# Patient Record
Sex: Male | Born: 1950
Health system: Southern US, Community
[De-identification: ages and names within clinical notes are randomized; demographics above are authoritative.]

## PROBLEM LIST (undated history)

## (undated) DIAGNOSIS — G4730 Sleep apnea, unspecified: Secondary | ICD-10-CM

## (undated) DIAGNOSIS — K5790 Diverticulosis of intestine, part unspecified, without perforation or abscess without bleeding: Secondary | ICD-10-CM

## (undated) DIAGNOSIS — R931 Abnormal findings on diagnostic imaging of heart and coronary circulation: Secondary | ICD-10-CM

## (undated) DIAGNOSIS — M25559 Pain in unspecified hip: Secondary | ICD-10-CM

## (undated) DIAGNOSIS — F32A Depression, unspecified: Secondary | ICD-10-CM

## (undated) DIAGNOSIS — G4733 Obstructive sleep apnea (adult) (pediatric): Secondary | ICD-10-CM

## (undated) DIAGNOSIS — Z9289 Personal history of other medical treatment: Secondary | ICD-10-CM

## (undated) DIAGNOSIS — E669 Obesity, unspecified: Secondary | ICD-10-CM

## (undated) DIAGNOSIS — Q210 Ventricular septal defect: Secondary | ICD-10-CM

## (undated) DIAGNOSIS — N400 Enlarged prostate without lower urinary tract symptoms: Secondary | ICD-10-CM

## (undated) DIAGNOSIS — M1990 Unspecified osteoarthritis, unspecified site: Secondary | ICD-10-CM

## (undated) DIAGNOSIS — Z5189 Encounter for other specified aftercare: Secondary | ICD-10-CM

## (undated) DIAGNOSIS — D649 Anemia, unspecified: Secondary | ICD-10-CM

## (undated) DIAGNOSIS — C801 Malignant (primary) neoplasm, unspecified: Secondary | ICD-10-CM

## (undated) DIAGNOSIS — F419 Anxiety disorder, unspecified: Secondary | ICD-10-CM

## (undated) DIAGNOSIS — Z9889 Other specified postprocedural states: Secondary | ICD-10-CM

## (undated) DIAGNOSIS — R000 Tachycardia, unspecified: Secondary | ICD-10-CM

## (undated) DIAGNOSIS — J189 Pneumonia, unspecified organism: Secondary | ICD-10-CM

## (undated) DIAGNOSIS — K219 Gastro-esophageal reflux disease without esophagitis: Secondary | ICD-10-CM

## (undated) DIAGNOSIS — E785 Hyperlipidemia, unspecified: Secondary | ICD-10-CM

## (undated) DIAGNOSIS — I10 Essential (primary) hypertension: Secondary | ICD-10-CM

## (undated) DIAGNOSIS — F329 Major depressive disorder, single episode, unspecified: Secondary | ICD-10-CM

## (undated) DIAGNOSIS — H269 Unspecified cataract: Secondary | ICD-10-CM

## (undated) DIAGNOSIS — K2210 Ulcer of esophagus without bleeding: Secondary | ICD-10-CM

## (undated) DIAGNOSIS — R9431 Abnormal electrocardiogram [ECG] [EKG]: Secondary | ICD-10-CM

## (undated) HISTORY — DX: Personal history of other medical treatment: Z92.89

## (undated) HISTORY — DX: Tachycardia, unspecified: R00.0

## (undated) HISTORY — DX: Unspecified cataract: H26.9

## (undated) HISTORY — PX: UPPER GASTROINTESTINAL ENDOSCOPY: SHX188

## (undated) HISTORY — DX: Diverticulosis of intestine, part unspecified, without perforation or abscess without bleeding: K57.90

## (undated) HISTORY — DX: Benign prostatic hyperplasia without lower urinary tract symptoms: N40.0

## (undated) HISTORY — PX: CATARACT EXTRACTION, BILATERAL: SHX1313

## (undated) HISTORY — DX: Depression, unspecified: F32.A

## (undated) HISTORY — DX: Obstructive sleep apnea (adult) (pediatric): G47.33

## (undated) HISTORY — DX: Obesity, unspecified: E66.9

## (undated) HISTORY — DX: Other specified postprocedural states: Z98.890

## (undated) HISTORY — DX: Abnormal findings on diagnostic imaging of heart and coronary circulation: R93.1

## (undated) HISTORY — DX: Encounter for other specified aftercare: Z51.89

## (undated) HISTORY — DX: Sleep apnea, unspecified: G47.30

## (undated) HISTORY — DX: Hyperlipidemia, unspecified: E78.5

## (undated) HISTORY — DX: Essential (primary) hypertension: I10

## (undated) HISTORY — DX: Major depressive disorder, single episode, unspecified: F32.9

## (undated) HISTORY — DX: Pain in unspecified hip: M25.559

## (undated) HISTORY — DX: Abnormal electrocardiogram (ECG) (EKG): R94.31

## (undated) HISTORY — DX: Ulcer of esophagus without bleeding: K22.10

## (undated) HISTORY — DX: Ventricular septal defect: Q21.0

## (undated) HISTORY — PX: COLONOSCOPY: SHX174

---

## 1983-11-10 HISTORY — PX: INGUINAL HERNIA REPAIR: SHX194

## 1985-11-09 HISTORY — PX: LAMINECTOMY: SHX219

## 1993-11-09 DIAGNOSIS — I10 Essential (primary) hypertension: Secondary | ICD-10-CM

## 1993-11-09 HISTORY — DX: Essential (primary) hypertension: I10

## 1997-11-09 HISTORY — PX: INGUINAL HERNIA REPAIR: SHX194

## 1998-04-09 DIAGNOSIS — E785 Hyperlipidemia, unspecified: Secondary | ICD-10-CM

## 1998-04-09 HISTORY — DX: Hyperlipidemia, unspecified: E78.5

## 1998-04-19 ENCOUNTER — Ambulatory Visit (HOSPITAL_BASED_OUTPATIENT_CLINIC_OR_DEPARTMENT_OTHER): Admission: RE | Admit: 1998-04-19 | Discharge: 1998-04-19 | Payer: Self-pay | Admitting: General Surgery

## 2000-05-24 ENCOUNTER — Ambulatory Visit (HOSPITAL_BASED_OUTPATIENT_CLINIC_OR_DEPARTMENT_OTHER): Admission: RE | Admit: 2000-05-24 | Discharge: 2000-05-24 | Payer: Self-pay | Admitting: Pulmonary Disease

## 2000-08-04 ENCOUNTER — Ambulatory Visit (HOSPITAL_BASED_OUTPATIENT_CLINIC_OR_DEPARTMENT_OTHER): Admission: RE | Admit: 2000-08-04 | Discharge: 2000-08-04 | Payer: Self-pay | Admitting: Pulmonary Disease

## 2002-02-20 ENCOUNTER — Encounter: Payer: Self-pay | Admitting: Family Medicine

## 2002-11-09 DIAGNOSIS — N400 Enlarged prostate without lower urinary tract symptoms: Secondary | ICD-10-CM

## 2002-11-09 HISTORY — DX: Benign prostatic hyperplasia without lower urinary tract symptoms: N40.0

## 2003-10-02 ENCOUNTER — Encounter: Payer: Self-pay | Admitting: Family Medicine

## 2003-11-01 LAB — FECAL OCCULT BLOOD, GUAIAC: Fecal Occult Blood: POSITIVE

## 2003-11-07 ENCOUNTER — Encounter: Payer: Self-pay | Admitting: Family Medicine

## 2006-07-13 ENCOUNTER — Ambulatory Visit: Payer: Self-pay | Admitting: Family Medicine

## 2006-08-11 ENCOUNTER — Ambulatory Visit: Payer: Self-pay | Admitting: Gastroenterology

## 2006-08-24 ENCOUNTER — Ambulatory Visit: Payer: Self-pay | Admitting: Gastroenterology

## 2006-08-24 ENCOUNTER — Encounter (INDEPENDENT_AMBULATORY_CARE_PROVIDER_SITE_OTHER): Payer: Self-pay | Admitting: Specialist

## 2006-08-24 HISTORY — PX: COLONOSCOPY W/ BIOPSIES: SHX1374

## 2006-10-14 ENCOUNTER — Encounter: Admission: RE | Admit: 2006-10-14 | Discharge: 2006-10-14 | Payer: Self-pay | Admitting: Sports Medicine

## 2006-11-03 ENCOUNTER — Encounter: Admission: RE | Admit: 2006-11-03 | Discharge: 2006-11-03 | Payer: Self-pay | Admitting: Sports Medicine

## 2006-11-24 ENCOUNTER — Encounter: Admission: RE | Admit: 2006-11-24 | Discharge: 2006-11-24 | Payer: Self-pay | Admitting: Sports Medicine

## 2006-11-30 ENCOUNTER — Ambulatory Visit: Payer: Self-pay | Admitting: Family Medicine

## 2006-11-30 LAB — CONVERTED CEMR LAB
CO2: 29 meq/L (ref 19–32)
Calcium: 9.7 mg/dL (ref 8.4–10.5)
Chloride: 102 meq/L (ref 96–112)
Cholesterol: 230 mg/dL (ref 0–200)
Creatinine, Ser: 1.1 mg/dL (ref 0.4–1.5)
GFR calc non Af Amer: 74 mL/min
Glucose, Bld: 150 mg/dL — ABNORMAL HIGH (ref 70–99)
HDL: 76.9 mg/dL (ref >39.0)
Sodium: 139 meq/L (ref 135–145)
Total Bilirubin: 1.1 mg/dL (ref 0.3–1.2)
Total CHOL/HDL Ratio: 3
Total Protein: 7.3 g/dL (ref 6.0–8.3)
Triglycerides: 143 mg/dL (ref 0–149)
VLDL: 29 mg/dL (ref 0–40)

## 2006-12-03 ENCOUNTER — Ambulatory Visit: Payer: Self-pay | Admitting: Family Medicine

## 2006-12-08 ENCOUNTER — Ambulatory Visit: Payer: Self-pay | Admitting: Family Medicine

## 2006-12-08 LAB — CONVERTED CEMR LAB
Glucose, Bld: 139 mg/dL — ABNORMAL HIGH (ref 70–99)
Hgb A1c MFr Bld: 7.2 %
Hgb A1c MFr Bld: 7.2 % — ABNORMAL HIGH (ref 4.6–6.0)

## 2006-12-15 ENCOUNTER — Ambulatory Visit: Payer: Self-pay | Admitting: Family Medicine

## 2007-01-13 ENCOUNTER — Ambulatory Visit: Payer: Self-pay | Admitting: Family Medicine

## 2007-02-14 ENCOUNTER — Ambulatory Visit: Payer: Self-pay | Admitting: Family Medicine

## 2007-04-14 ENCOUNTER — Encounter (INDEPENDENT_AMBULATORY_CARE_PROVIDER_SITE_OTHER): Payer: Self-pay | Admitting: *Deleted

## 2007-04-15 ENCOUNTER — Encounter: Payer: Self-pay | Admitting: Family Medicine

## 2007-04-15 DIAGNOSIS — Z87891 Personal history of nicotine dependence: Secondary | ICD-10-CM | POA: Insufficient documentation

## 2007-04-15 DIAGNOSIS — F341 Dysthymic disorder: Secondary | ICD-10-CM | POA: Insufficient documentation

## 2007-04-15 DIAGNOSIS — F528 Other sexual dysfunction not due to a substance or known physiological condition: Secondary | ICD-10-CM | POA: Insufficient documentation

## 2007-04-15 DIAGNOSIS — G4730 Sleep apnea, unspecified: Secondary | ICD-10-CM

## 2007-04-17 DIAGNOSIS — N400 Enlarged prostate without lower urinary tract symptoms: Secondary | ICD-10-CM | POA: Insufficient documentation

## 2007-04-17 DIAGNOSIS — E1169 Type 2 diabetes mellitus with other specified complication: Secondary | ICD-10-CM | POA: Insufficient documentation

## 2007-04-17 DIAGNOSIS — E119 Type 2 diabetes mellitus without complications: Secondary | ICD-10-CM

## 2007-04-17 DIAGNOSIS — E785 Hyperlipidemia, unspecified: Secondary | ICD-10-CM

## 2007-04-17 DIAGNOSIS — I10 Essential (primary) hypertension: Secondary | ICD-10-CM | POA: Insufficient documentation

## 2007-04-19 ENCOUNTER — Ambulatory Visit: Payer: Self-pay | Admitting: Family Medicine

## 2007-04-20 LAB — CONVERTED CEMR LAB
BUN: 12 mg/dL (ref 6–23)
CO2: 29 meq/L (ref 19–32)
Creatinine, Ser: 0.9 mg/dL (ref 0.4–1.5)
Hgb A1c MFr Bld: 7 % — ABNORMAL HIGH (ref 4.6–6.0)
Microalb, Ur: 1.5 mg/dL (ref 0.0–1.9)
Potassium: 4.5 meq/L (ref 3.5–5.1)
Sodium: 140 meq/L (ref 135–145)

## 2007-04-21 ENCOUNTER — Ambulatory Visit: Payer: Self-pay | Admitting: Family Medicine

## 2007-05-27 ENCOUNTER — Ambulatory Visit: Payer: Self-pay | Admitting: Family Medicine

## 2007-08-25 ENCOUNTER — Ambulatory Visit: Payer: Self-pay | Admitting: Family Medicine

## 2007-08-25 LAB — CONVERTED CEMR LAB: Hgb A1c MFr Bld: 6.8 % — ABNORMAL HIGH (ref 4.6–6.0)

## 2007-08-29 ENCOUNTER — Ambulatory Visit: Payer: Self-pay | Admitting: Family Medicine

## 2008-01-10 ENCOUNTER — Telehealth: Payer: Self-pay | Admitting: Family Medicine

## 2008-04-05 ENCOUNTER — Encounter: Admission: RE | Admit: 2008-04-05 | Discharge: 2008-04-05 | Payer: Self-pay | Admitting: Sports Medicine

## 2008-10-23 ENCOUNTER — Ambulatory Visit: Payer: Self-pay | Admitting: Family Medicine

## 2008-10-31 ENCOUNTER — Ambulatory Visit: Payer: Self-pay | Admitting: Family Medicine

## 2008-12-05 ENCOUNTER — Ambulatory Visit: Payer: Self-pay | Admitting: Family Medicine

## 2008-12-13 ENCOUNTER — Ambulatory Visit: Payer: Self-pay | Admitting: Family Medicine

## 2008-12-14 LAB — CONVERTED CEMR LAB
ALT: 27 units/L (ref 0–53)
AST: 25 units/L (ref 0–37)
Alkaline Phosphatase: 46 units/L (ref 39–117)
Basophils Absolute: 0 10*3/uL (ref 0.0–0.1)
Basophils Relative: 0.2 % (ref 0.0–3.0)
Bilirubin, Direct: 0.1 mg/dL (ref 0.0–0.3)
CO2: 0 meq/L — CL (ref 19–32)
Chloride: 109 meq/L (ref 96–112)
Cholesterol: 155 mg/dL (ref 0–200)
Eosinophils Relative: 1.5 % (ref 0.0–5.0)
GFR calc non Af Amer: 106 mL/min
Hemoglobin: 12.6 g/dL — ABNORMAL LOW (ref 13.0–17.0)
Hgb A1c MFr Bld: 6.7 % — ABNORMAL HIGH (ref 4.6–6.0)
LDL Cholesterol: 99 mg/dL (ref 0–99)
Lymphocytes Relative: 30.5 % (ref 12.0–46.0)
MCHC: 34.8 g/dL (ref 30.0–36.0)
MCV: 96.4 fL (ref 78.0–100.0)
Neutro Abs: 3.7 10*3/uL (ref 1.4–7.7)
PSA: 1.95 ng/mL (ref 0.10–4.00)
RBC: 3.77 M/uL — ABNORMAL LOW (ref 4.22–5.81)
TSH: 0.6 microintl units/mL (ref 0.35–5.50)
Total Bilirubin: 0.6 mg/dL (ref 0.3–1.2)
WBC: 6.5 10*3/uL (ref 4.5–10.5)

## 2008-12-17 ENCOUNTER — Ambulatory Visit: Payer: Self-pay | Admitting: Family Medicine

## 2008-12-17 DIAGNOSIS — E669 Obesity, unspecified: Secondary | ICD-10-CM | POA: Insufficient documentation

## 2008-12-18 ENCOUNTER — Encounter (INDEPENDENT_AMBULATORY_CARE_PROVIDER_SITE_OTHER): Payer: Self-pay | Admitting: *Deleted

## 2008-12-18 LAB — CONVERTED CEMR LAB: Vit D, 1,25-Dihydroxy: 22 — ABNORMAL LOW (ref 30–89)

## 2008-12-31 ENCOUNTER — Telehealth: Payer: Self-pay | Admitting: Family Medicine

## 2009-01-14 ENCOUNTER — Ambulatory Visit: Payer: Self-pay | Admitting: Family Medicine

## 2009-01-14 LAB — CONVERTED CEMR LAB
BUN: 15 mg/dL (ref 6–23)
Calcium: 9.4 mg/dL (ref 8.4–10.5)
Chloride: 102 meq/L (ref 96–112)
Creatinine, Ser: 0.8 mg/dL (ref 0.4–1.5)
GFR calc non Af Amer: 106 mL/min
Hgb A1c MFr Bld: 6.2 % — ABNORMAL HIGH (ref 4.6–6.0)

## 2009-01-16 ENCOUNTER — Ambulatory Visit: Payer: Self-pay | Admitting: Family Medicine

## 2009-03-29 ENCOUNTER — Encounter: Admission: RE | Admit: 2009-03-29 | Discharge: 2009-03-29 | Payer: Self-pay | Admitting: Sports Medicine

## 2009-04-17 ENCOUNTER — Ambulatory Visit: Payer: Self-pay | Admitting: Family Medicine

## 2009-04-17 LAB — CONVERTED CEMR LAB: Hgb A1c MFr Bld: 6.3 % (ref 4.6–6.5)

## 2009-04-22 ENCOUNTER — Ambulatory Visit: Payer: Self-pay | Admitting: Family Medicine

## 2009-04-22 DIAGNOSIS — R000 Tachycardia, unspecified: Secondary | ICD-10-CM

## 2009-05-01 ENCOUNTER — Ambulatory Visit: Payer: Self-pay | Admitting: Cardiology

## 2009-05-01 ENCOUNTER — Encounter: Payer: Self-pay | Admitting: Cardiology

## 2009-05-03 ENCOUNTER — Encounter (INDEPENDENT_AMBULATORY_CARE_PROVIDER_SITE_OTHER): Payer: Self-pay | Admitting: General Surgery

## 2009-05-09 DIAGNOSIS — Z9289 Personal history of other medical treatment: Secondary | ICD-10-CM

## 2009-05-09 DIAGNOSIS — R9431 Abnormal electrocardiogram [ECG] [EKG]: Secondary | ICD-10-CM

## 2009-05-09 DIAGNOSIS — R931 Abnormal findings on diagnostic imaging of heart and coronary circulation: Secondary | ICD-10-CM

## 2009-05-09 HISTORY — DX: Abnormal findings on diagnostic imaging of heart and coronary circulation: R93.1

## 2009-05-09 HISTORY — DX: Personal history of other medical treatment: Z92.89

## 2009-05-09 HISTORY — DX: Abnormal electrocardiogram (ECG) (EKG): R94.31

## 2009-05-10 LAB — CONVERTED CEMR LAB: TSH: 0.42 microintl units/mL (ref 0.350–4.500)

## 2009-05-14 ENCOUNTER — Encounter: Payer: Self-pay | Admitting: Family Medicine

## 2009-05-15 ENCOUNTER — Encounter: Payer: Self-pay | Admitting: Cardiology

## 2009-05-15 ENCOUNTER — Ambulatory Visit: Payer: Self-pay

## 2009-05-17 ENCOUNTER — Ambulatory Visit: Payer: Self-pay | Admitting: Cardiology

## 2009-05-22 ENCOUNTER — Ambulatory Visit (HOSPITAL_COMMUNITY): Admission: RE | Admit: 2009-05-22 | Discharge: 2009-05-22 | Payer: Self-pay | Admitting: Cardiology

## 2009-05-22 ENCOUNTER — Ambulatory Visit: Payer: Self-pay | Admitting: Cardiology

## 2009-05-22 LAB — CONVERTED CEMR LAB
Calcium: 9.2 mg/dL (ref 8.4–10.5)
Potassium: 4.5 meq/L (ref 3.5–5.3)
Sodium: 143 meq/L (ref 135–145)

## 2009-05-23 ENCOUNTER — Encounter (INDEPENDENT_AMBULATORY_CARE_PROVIDER_SITE_OTHER): Payer: Self-pay | Admitting: *Deleted

## 2009-05-23 ENCOUNTER — Telehealth: Payer: Self-pay | Admitting: Cardiology

## 2009-05-23 ENCOUNTER — Ambulatory Visit: Payer: Self-pay | Admitting: Cardiology

## 2009-05-23 ENCOUNTER — Encounter: Payer: Self-pay | Admitting: Cardiology

## 2009-05-23 DIAGNOSIS — Q210 Ventricular septal defect: Secondary | ICD-10-CM | POA: Insufficient documentation

## 2009-05-24 LAB — CONVERTED CEMR LAB
Glucose, Bld: 89 mg/dL (ref 70–99)
HCT: 41.6 % (ref 39.0–52.0)
MCV: 100.7 fL — ABNORMAL HIGH (ref 78.0–100.0)
Platelets: 238 10*3/uL (ref 150–400)
RDW: 12.3 % (ref 11.5–15.5)
Sodium: 143 meq/L (ref 135–145)

## 2009-05-30 ENCOUNTER — Inpatient Hospital Stay (HOSPITAL_BASED_OUTPATIENT_CLINIC_OR_DEPARTMENT_OTHER): Admission: RE | Admit: 2009-05-30 | Discharge: 2009-05-30 | Payer: Self-pay | Admitting: Cardiology

## 2009-05-30 ENCOUNTER — Ambulatory Visit: Payer: Self-pay | Admitting: Cardiology

## 2009-05-30 HISTORY — PX: CARDIAC CATHETERIZATION: SHX172

## 2009-06-11 ENCOUNTER — Ambulatory Visit: Payer: Self-pay | Admitting: Cardiology

## 2009-06-17 LAB — CONVERTED CEMR LAB
BUN: 17 mg/dL (ref 6–23)
CO2: 23 meq/L (ref 19–32)
Chloride: 102 meq/L (ref 96–112)
Creatinine, Ser: 0.72 mg/dL (ref 0.40–1.50)
Potassium: 4.7 meq/L (ref 3.5–5.3)
Sodium: 139 meq/L (ref 135–145)

## 2009-06-27 ENCOUNTER — Encounter: Payer: Self-pay | Admitting: Cardiology

## 2009-07-22 ENCOUNTER — Encounter: Payer: Self-pay | Admitting: Cardiology

## 2009-07-26 ENCOUNTER — Encounter: Admission: RE | Admit: 2009-07-26 | Discharge: 2009-07-26 | Payer: Self-pay | Admitting: Emergency Medicine

## 2009-08-26 ENCOUNTER — Telehealth: Payer: Self-pay | Admitting: Cardiology

## 2009-08-27 ENCOUNTER — Ambulatory Visit: Payer: Self-pay | Admitting: Family Medicine

## 2009-08-28 ENCOUNTER — Encounter: Payer: Self-pay | Admitting: Family Medicine

## 2009-09-03 ENCOUNTER — Encounter: Payer: Self-pay | Admitting: Cardiology

## 2009-10-08 ENCOUNTER — Telehealth: Payer: Self-pay | Admitting: Cardiology

## 2009-10-21 ENCOUNTER — Telehealth: Payer: Self-pay | Admitting: Family Medicine

## 2009-10-22 ENCOUNTER — Ambulatory Visit: Payer: Self-pay | Admitting: Family Medicine

## 2009-11-09 HISTORY — PX: TOTAL HIP ARTHROPLASTY: SHX124

## 2009-11-25 ENCOUNTER — Encounter: Payer: Self-pay | Admitting: Family Medicine

## 2009-11-27 ENCOUNTER — Ambulatory Visit: Payer: Self-pay | Admitting: Family Medicine

## 2009-11-28 ENCOUNTER — Encounter: Payer: Self-pay | Admitting: Cardiology

## 2009-12-10 ENCOUNTER — Ambulatory Visit (HOSPITAL_COMMUNITY): Admission: RE | Admit: 2009-12-10 | Discharge: 2009-12-10 | Payer: Self-pay | Admitting: Orthopedic Surgery

## 2009-12-10 DIAGNOSIS — R931 Abnormal findings on diagnostic imaging of heart and coronary circulation: Secondary | ICD-10-CM

## 2009-12-10 HISTORY — DX: Abnormal findings on diagnostic imaging of heart and coronary circulation: R93.1

## 2009-12-10 HISTORY — PX: ROTATOR CUFF REPAIR: SHX139

## 2009-12-31 ENCOUNTER — Encounter: Payer: Self-pay | Admitting: Cardiology

## 2009-12-31 ENCOUNTER — Ambulatory Visit: Payer: Self-pay

## 2010-01-22 ENCOUNTER — Ambulatory Visit: Payer: Self-pay | Admitting: Cardiology

## 2010-04-10 ENCOUNTER — Encounter: Admission: RE | Admit: 2010-04-10 | Discharge: 2010-04-10 | Payer: Self-pay | Admitting: Sports Medicine

## 2010-06-17 ENCOUNTER — Encounter (INDEPENDENT_AMBULATORY_CARE_PROVIDER_SITE_OTHER): Payer: Self-pay | Admitting: *Deleted

## 2010-08-06 ENCOUNTER — Ambulatory Visit: Payer: Self-pay | Admitting: Cardiology

## 2010-08-11 ENCOUNTER — Ambulatory Visit: Payer: Self-pay | Admitting: Cardiology

## 2010-08-18 ENCOUNTER — Encounter: Payer: Self-pay | Admitting: Cardiology

## 2010-08-19 LAB — CONVERTED CEMR LAB
Cholesterol: 219 mg/dL — ABNORMAL HIGH (ref 0–200)
LDL Cholesterol: 132 mg/dL — ABNORMAL HIGH (ref 0–99)
Triglycerides: 153 mg/dL — ABNORMAL HIGH (ref <150)
VLDL: 31 mg/dL (ref 0–40)

## 2010-08-22 ENCOUNTER — Telehealth: Payer: Self-pay | Admitting: Cardiology

## 2010-08-27 ENCOUNTER — Telehealth: Payer: Self-pay | Admitting: Cardiology

## 2010-09-04 ENCOUNTER — Ambulatory Visit: Payer: Self-pay | Admitting: Family Medicine

## 2010-09-08 ENCOUNTER — Encounter: Payer: Self-pay | Admitting: Family Medicine

## 2010-09-10 LAB — CONVERTED CEMR LAB
BUN: 16 mg/dL (ref 6–23)
Creatinine, Ser: 0.8 mg/dL (ref 0.4–1.5)
GFR calc non Af Amer: 110.03 mL/min (ref >60)
Hgb A1c MFr Bld: 6.7 % — ABNORMAL HIGH (ref 4.6–6.5)

## 2010-09-12 ENCOUNTER — Telehealth: Payer: Self-pay | Admitting: Cardiology

## 2010-09-17 ENCOUNTER — Inpatient Hospital Stay (HOSPITAL_COMMUNITY): Admission: RE | Admit: 2010-09-17 | Discharge: 2010-09-21 | Payer: Self-pay | Admitting: Orthopedic Surgery

## 2010-09-18 ENCOUNTER — Encounter (INDEPENDENT_AMBULATORY_CARE_PROVIDER_SITE_OTHER): Payer: Self-pay | Admitting: *Deleted

## 2010-09-19 ENCOUNTER — Encounter (INDEPENDENT_AMBULATORY_CARE_PROVIDER_SITE_OTHER): Payer: Self-pay | Admitting: *Deleted

## 2010-09-20 ENCOUNTER — Encounter (INDEPENDENT_AMBULATORY_CARE_PROVIDER_SITE_OTHER): Payer: Self-pay | Admitting: *Deleted

## 2010-09-21 ENCOUNTER — Encounter (INDEPENDENT_AMBULATORY_CARE_PROVIDER_SITE_OTHER): Payer: Self-pay | Admitting: *Deleted

## 2010-09-26 ENCOUNTER — Ambulatory Visit: Payer: Self-pay | Admitting: Family Medicine

## 2010-09-26 DIAGNOSIS — D509 Iron deficiency anemia, unspecified: Secondary | ICD-10-CM

## 2010-10-07 ENCOUNTER — Encounter (INDEPENDENT_AMBULATORY_CARE_PROVIDER_SITE_OTHER): Payer: Self-pay | Admitting: *Deleted

## 2010-10-14 ENCOUNTER — Ambulatory Visit: Payer: Self-pay | Admitting: Family Medicine

## 2010-11-09 HISTORY — PX: ESOPHAGOGASTRODUODENOSCOPY: SHX1529

## 2010-11-24 ENCOUNTER — Ambulatory Visit
Admission: RE | Admit: 2010-11-24 | Discharge: 2010-11-24 | Payer: Self-pay | Source: Home / Self Care | Attending: Gastroenterology | Admitting: Gastroenterology

## 2010-11-24 ENCOUNTER — Encounter: Payer: Self-pay | Admitting: Gastroenterology

## 2010-11-24 ENCOUNTER — Other Ambulatory Visit: Payer: Self-pay | Admitting: Gastroenterology

## 2010-11-24 DIAGNOSIS — K219 Gastro-esophageal reflux disease without esophagitis: Secondary | ICD-10-CM | POA: Insufficient documentation

## 2010-11-24 LAB — IGA: IgA: 281 mg/dL (ref 68–378)

## 2010-11-24 LAB — CBC WITH DIFFERENTIAL/PLATELET
Basophils Absolute: 0 10*3/uL (ref 0.0–0.1)
Basophils Relative: 0.3 % (ref 0.0–3.0)
Eosinophils Absolute: 0.1 10*3/uL (ref 0.0–0.7)
Eosinophils Relative: 1 % (ref 0.0–5.0)
HCT: 41.9 % (ref 39.0–52.0)
Hemoglobin: 14.7 g/dL (ref 13.0–17.0)
Lymphocytes Relative: 23.1 % (ref 12.0–46.0)
Lymphs Abs: 1.9 10*3/uL (ref 0.7–4.0)
MCHC: 35 g/dL (ref 30.0–36.0)
MCV: 93.7 fl (ref 78.0–100.0)
Monocytes Absolute: 0.5 10*3/uL (ref 0.1–1.0)
Monocytes Relative: 6.1 % (ref 3.0–12.0)
Neutro Abs: 5.6 10*3/uL (ref 1.4–7.7)
Neutrophils Relative %: 69.5 % (ref 43.0–77.0)
Platelets: 250 10*3/uL (ref 150.0–400.0)
RBC: 4.47 Mil/uL (ref 4.22–5.81)
RDW: 13.2 % (ref 11.5–14.6)
WBC: 8.1 10*3/uL (ref 4.5–10.5)

## 2010-11-25 LAB — CONVERTED CEMR LAB: Tissue Transglutaminase Ab, IgA: 16.4 units (ref <20)

## 2010-12-01 ENCOUNTER — Ambulatory Visit: Admit: 2010-12-01 | Payer: Self-pay | Admitting: Family Medicine

## 2010-12-04 ENCOUNTER — Ambulatory Visit: Admit: 2010-12-04 | Payer: Self-pay | Admitting: Family Medicine

## 2010-12-07 LAB — CONVERTED CEMR LAB
CO2: 24 meq/L (ref 19–32)
Calcium: 9.3 mg/dL (ref 8.4–10.5)
Glucose, Bld: 115 mg/dL — ABNORMAL HIGH (ref 70–99)

## 2010-12-08 ENCOUNTER — Other Ambulatory Visit: Payer: Self-pay | Admitting: Gastroenterology

## 2010-12-08 ENCOUNTER — Ambulatory Visit
Admission: RE | Admit: 2010-12-08 | Discharge: 2010-12-08 | Payer: Self-pay | Source: Home / Self Care | Attending: Gastroenterology | Admitting: Gastroenterology

## 2010-12-08 DIAGNOSIS — K2270 Barrett's esophagus without dysplasia: Secondary | ICD-10-CM

## 2010-12-08 DIAGNOSIS — D1330 Benign neoplasm of unspecified part of small intestine: Secondary | ICD-10-CM

## 2010-12-08 LAB — HM COLONOSCOPY

## 2010-12-08 LAB — GLUCOSE, CAPILLARY
Glucose-Capillary: 142 mg/dL — ABNORMAL HIGH (ref 70–99)
Glucose-Capillary: 128 mg/dL — ABNORMAL HIGH (ref 70–99)

## 2010-12-09 NOTE — Assessment & Plan Note (Signed)
Summary: Tim Mccullough   Visit Type:  Follow-up Referring Provider:  Dr. Senaida Lange Primary Provider:  Dr. Hetty Ely  CC:  no complaints.  History of Present Illness: 60 yo with history of HTN, DM, and nonischemic cardiomyopathy presents for followup.  He continues to do well with minimal cardiopulmonary symptoms.  He is limited by his left hip (severe OA) but not by shortness of breath.  The only time recently he has noted shortness of breath was with walking up stairs to the upper deck seats in the [employer] for the [employer] tournament. No orthopnea or PND.  No chest pain.  He had right rotator cuff surgery which is tolerated well.  CPAP is helping him sleep.   ECG: NSR, normal  Labs (8/10): K 4.7, creatinine 0.7 Labs (3/11): K 4.7, creatinine 0.98  Current Problems (verified): 1)  Vsd  (ICD-745.4) 2)  Cardiomyopathy, Dilated Ef 35%  (ICD-425.4) 3)  Unspecified Tachycardia  (ICD-785.0) 4)  Obesity  (ICD-278.00) 5)  Health Maintenance Exam  (ICD-V70.0) 6)  Anxiety Depression  (ICD-300.4) 7)  Sleep Apnea  (ICD-780.57) 8)  Erectile Dysfunction  (ICD-302.72) 9)  Hx, Personal, Tobacco Use  (ICD-V15.82) 10)  Benign Prostatic Hypertrophy  (ICD-600.00) 11)  Hypertension  (ICD-401.9) 12)  Hyperlipidemia  (ICD-272.4) 13)  Diabetes Mellitus, Type II  (ICD-250.00)  Current Medications (verified): 1)  Cardura 2 Mg Tabs (Doxazosin Mesylate) .Marland Kitchen.. 1tablet By Mouth Daily 2)  Onetouch Ultra Test  Strp (Glucose Blood) .... Check Daily and If Blood Sugar Fluctuates  250.00 3)  Glucophage 500 Mg Tabs (Metformin Hcl) .... Take 2  Tabs By Mouth Two Times A Day 4)  Cozaar 100 Mg Tabs (Losartan Potassium) .Marland Kitchen.. 1 By Mouth Daily and 1/2 At Night. 5)  Alka-Seltzer Anti-Gas 125 Mg Caps (Simethicone) .... As Needed Indigestion 6)  Vitamin D 1000 Unit Tabs (Cholecalciferol) .... One Tab By Mouth Once Daily 7)  Paroxetine Hcl 20 Mg Tabs (Paroxetine Hcl) .Marland Kitchen.. 1 Daily By Mouth 8)  Aspirin 81 Mg  Tabs (Aspirin)  .... Take 2 By Mouth Daily 9)  Fish Oil 1000 Mg Caps (Omega-3 Fatty Acids) .Marland Kitchen.. 1 Cap Two Times A Day 10)  Carvedilol 25 Mg Tabs (Carvedilol) .... Take One Tablet By Mouth Twice A Day 11)  Spironolactone 25 Mg Tabs (Spironolactone) .... Take 1/2 Tablet By Mouth Daily  Allergies (verified): No Known Drug Allergies  Past History:  Family History: Last updated: 11/27/2009 Father: dec  20  CABG, DM, STROKE  Mother: dec 88  Natural Causes  HTN, ALTZHEMIERS BROTHER A 67, DM SISTER A 66 DM, HTN SISTER A 63 DM, HTN CV:+CAGB FATHER HBP:+ MOTHERS ,SISTERS DM:+ BOTH SIDES , MGM(INSULIN) , FATHER PROSTATE/CANCER +GF, THROAT/ SMOKER /ETOH DEPRESSION: + SELF ETOH/DRUG ABUSE: NEGATIVE OTHER:+STROKE FATHER    Social History: Last updated: 11/27/2009 Marital Status: divorced. Occupation: [employer], MACHINIST Quit smoking in 2000.  Lives in [place].   Past Medical History: 1. Diabetes mellitus, type II: 11/2006 2. Hyperlipidemia:04/1998 3. Hypertension: 1995 4. Benign prostatic hypertrophy:2004 5. Sinus tachycardia: Pt says he has been told his heart rate is high since he was in high school.  Holter monitor (7/10) showed frequent PACs and PVCs.  Average HR 96 (but he had started Toprol XL).  HR range 71-130.  6. OSA: not using CPAP. 7. Obesity. 8. Hip pain 9. Cardiomyopathy.  ? tachycardia-mediated.  Echo (7/10) showed mild LVH with a mildly dilated LV.  EF was difficult to assess given poor acoustic windows but appeared moderately  decreased.  Mild MR.  Probable small perimembranous VSD, mild LAE.  Cardiac MRI was done to followup difficult echo (7/10): This showed mild to moderately dilated LV, mild to moderate LAE, EF 33% with global hypokinesis, normal RV size with mild systolic dysfunction.  There was no large VSD evident.  There was no myocardial delayed enhancement so no evidence for infiltrative disease or infarction.   LHC (7/10) showed only luminal irregularities in  the coronaries and EF 35%.  RHC showed mean RA 8, PA 28/15, mean PCWP 10, CI 3.2.  There was no step-up in oxygen saturation on the right side of the heart suggesting no hemodynamically significant VSD.   Repeat echo in 2/11 after medical management of cardiomyopathy showed EF 40-45%, mildly dilated LV, mild LV hypertrophy, anterolateral and apical hypokinesis, mild diastolic dysfunction.  10.  Probable small perimembranous VSD.   Family History: Reviewed history from 11/27/2009 and no changes required. Father: dec  32  CABG, DM, STROKE  Mother: dec 88  Natural Causes  HTN, ALTZHEMIERS BROTHER A 67, DM SISTER A 66 DM, HTN SISTER A 63 DM, HTN CV:+CAGB FATHER HBP:+ MOTHERS ,SISTERS DM:+ BOTH SIDES , MGM(INSULIN) , FATHER PROSTATE/CANCER +GF, THROAT/ SMOKER /ETOH DEPRESSION: + SELF ETOH/DRUG ABUSE: NEGATIVE OTHER:+STROKE FATHER    Social History: Reviewed history from 11/27/2009 and no changes required. Marital Status: divorced. Occupation: [employer], MACHINIST Quit smoking in 2000.  Lives in [place].   Review of Systems       All systems reviewed and negative except as per HPI.   Vital Signs:  Patient profile:   60 year old male Height:      70 inches Weight:      235.50 pounds BMI:     33.91 Pulse rate:   95 / minute Pulse rhythm:   regular BP sitting:   110 / 70  (left arm) Cuff size:   large  Vitals Entered By: Mercer Pod (January 22, 2010 11:31 AM)  Physical Exam  General:  Well developed, well nourished, in no acute distress. Obese.  Neck:  Neck supple, no JVD. No masses, thyromegaly or abnormal cervical nodes. Lungs:  Clear bilaterally to auscultation and percussion. Heart:  Non-displaced PMI, chest non-tender; regular rate and rhythm, S1, S2 without murmurs, rubs or gallops. Carotid upstroke normal, no bruit. Pedals normal pulses. No edema, no varicosities. Abdomen:  Bowel sounds positive; abdomen soft and non-tender without masses,  organomegaly, or hernias noted. No hepatosplenomegaly. Extremities:  No clubbing or cyanosis. Neurologic:  Alert and oriented x 3. Psych:  Normal affect.   Impression & Recommendations:  Problem # 1:  CARDIOMYOPATHY, DILATED  (ICD-425.4) Nonischemic dilated cardiomyopathy, EF 33% by MRI and 35% by cath.  No definite evidence for infiltrative disease on cardiac MRI and no CAD on cath.  Repeat echo in 2/11 after medical treatment has shown some improvement with EF 40-45%.  He has a probable small, hemodynamically insignificant VSD that does not explain his cardiomyopathy.  It may be that long-standing sinus tachycardia contributed to his cardiomyopathy (tachycardia-mediated).  HR is in the 90s now on carvedilol.  - Continue current doses of losartan and Coreg.   - Increase spironolactone to 25 mg daily and check BMET in 2 wks.  - No indication for ICD at this point.    Problem # 2:  PREOPERATIVE EVALUATION Patient needs a left total hip replacement due to severe osteoarthritis.  He tolerated shoulder surgery well.  I do not think any further cardiac testing  would be needed before THR but patient should remain on his cardiac meds.    Patient Instructions: 1)  Your physician recommends that you schedule a follow-up appointment in: 6 months 2)  Your physician recommends that you return for lab work in: 2 weeks (bmet) 3)  Your physician has recommended you make the following change in your medication: increase spironolactone to 25 mg daily  Prescriptions: SPIRONOLACTONE 25 MG TABS (SPIRONOLACTONE) Take 1 tablet by mouth daily  #30 x 6   Entered by:   Tim Cross, RN, BSN   Authorized by:   Marca Ancona, MD   Signed by:   Tim Cross, RN, BSN on 01/22/2010   Method used:   Electronically to        [id number] ([facility])       [address]       [place], [place]  [address]       Ph: [phone]       Fax: [phone]   RxID:    [id number]

## 2010-12-09 NOTE — Letter (Signed)
Summary: New Patient letter  [facility]  [address] [place], [place] [address]   Phone: [phone]  Fax: [phone]       10/07/2010 MRN: [id number]    [facility] [address] [place], [place]  [address]  Dear Tim Mccullough,  Welcome to the Gastroenterology Division at [facility]    You are scheduled to see Dr. Claudette Head on Monday, November 24, 2010 at 9:30 a.m. on the 3rd floor at [facility], [address] [employer].  We ask that you try to arrive at our office 15 minutes prior to your appointment time to allow for check-in.  We would like you to complete the enclosed self-administered evaluation form prior to your visit and bring it with you on the day of your appointment.  We will review it with you.  Also, please bring a complete list of all your medications or, if you prefer, bring the medication bottles and we will list them.  Please bring your insurance card so that we may make a copy of it.  If your insurance requires a referral to see a specialist, please bring your referral form from your primary care physician.  Co-payments are due at the time of your visit and may be paid by cash, check or credit card.     Your office visit will consist of a consult with your physician (includes a physical exam), any laboratory testing he/she may order, scheduling of any necessary diagnostic testing (e.g. x-ray, ultrasound, CT-scan), and scheduling of a procedure (e.g. Endoscopy, Colonoscopy) if required.  Please allow enough time on your schedule to allow for any/all of these possibilities.    If you cannot keep your appointment, please call [phone] to cancel or reschedule prior to your appointment date.  This allows [place] the opportunity to schedule an appointment for another patient in need of care.  If you do not cancel or reschedule by 5 p.m. the business day prior to your appointment date, you will be charged a $50.00 late cancellation/no-show  fee.    Thank you for choosing [facility] for your medical needs.  We appreciate the opportunity to care for you.  Please visit [place] at our website  to learn more about our practice.                     Sincerely,                                                             The Gastroenterology Division

## 2010-12-09 NOTE — Letter (Signed)
Summary: Clearance Letter  Architectural technologist at [address]   [place], [place] [address]   Phone: [phone]  Fax: [phone]    November 28, 2009  Re:     [facility] Address:   [address]     [place], [place]  [address] DOB:     [birth year] MRN:     [id number]   Dear Jones Broom MD,   Mr. Scerbo is cleared from a cardiology standpoint, for his shoulder arthroscopy with rotator cuff repair. Please be aware that he has cardiomyopathy and needs to stay on all his cardiac medications. If any questions please call [phone].    Sincerely,     Charlena Cross, RN, BSN Marca Ancona, MD

## 2010-12-09 NOTE — Assessment & Plan Note (Signed)
Summary: CLEARANCE FOR SURGERY ON 1/27/CLE   Vital Signs:  Patient profile:   60 year old male Weight:      237 pounds Temp:     98.9 degrees F oral Pulse rate:   104 / minute Pulse rhythm:   regular BP sitting:   110 / 70  (left arm) Cuff size:   large  Vitals Entered By: Sydell Axon LPN (November 27, 2009 3:43 PM) CC: Surgical clearance-Rotator cuff repair   History of Present Illness: Pt here for surgery clearance  for rotator cuff repair by Dr Ave Filter scheduled 1/27. He feels well except for shoulder and hip pain. He was told the shoulder rehab time is about 6 mos which he thinks will drive him nuts from boredom. He is hopeful he might be able to have his hip replaced toward the end of his shoulder rehab to piggyback on his down time.  He is trying to be compliant and careful with diet . His sugar averages 100, rarely higher.  He has no other complaints.  Preventive Screening-Counseling & Management  Alcohol-Tobacco     Alcohol drinks/day: 1     Alcohol type: beer     Smoking Status: quit     Packs/Day: 07/1999     Year Quit: 1999     Pack years: 25     Passive Smoke Exposure: no  Caffeine-Diet-Exercise     Caffeine use/day: 1     Does Patient Exercise: no  Problems Prior to Update: 1)  Dizziness  (ICD-780.4) 2)  Vsd  (ICD-745.4) 3)  Cardiomyopathy, Dilated Ef 35%  (ICD-425.4) 4)  Unspecified Tachycardia  (ICD-785.0) 5)  Obesity  (ICD-278.00) 6)  Health Maintenance Exam  (ICD-V70.0) 7)  Anxiety Depression  (ICD-300.4) 8)  Sleep Apnea  (ICD-780.57) 9)  Erectile Dysfunction  (ICD-302.72) 10)  Hx, Personal, Tobacco Use  (ICD-V15.82) 11)  Benign Prostatic Hypertrophy  (ICD-600.00) 12)  Hypertension  (ICD-401.9) 13)  Hyperlipidemia  (ICD-272.4) 14)  Diabetes Mellitus, Type II  (ICD-250.00)  Medications Prior to Update: 1)  Cardura 2 Mg Tabs (Doxazosin Mesylate) .Marland Kitchen.. 1tablet By Mouth Daily 2)  Onetouch Ultra Test  Strp (Glucose Blood) .... Check Daily and If Blood  Sugar Fluctuates  250.00 3)  Glucophage 500 Mg Tabs (Metformin Hcl) .... Take 2  Tabs By Mouth Two Times A Day 4)  Cozaar 100 Mg Tabs (Losartan Potassium) .... One Tab By Mouth At Night; and 1/2 During The Day 5)  Alka-Seltzer Anti-Gas 125 Mg Caps (Simethicone) .... As Needed Indigestion 6)  Vitamin D 1000 Unit Tabs (Cholecalciferol) .... One Tab By Mouth Once Daily 7)  Paroxetine Hcl 20 Mg Tabs (Paroxetine Hcl) .Marland Kitchen.. 1 Daily By Mouth 8)  Aspirin 81 Mg  Tabs (Aspirin) .... One Daily 9)  Fish Oil 1000 Mg Caps (Omega-3 Fatty Acids) .Marland Kitchen.. 1 Cap Two Times A Day 10)  Carvedilol 25 Mg Tabs (Carvedilol) .... Take One Tablet By Mouth Twice A Day 11)  Spironolactone 25 Mg Tabs (Spironolactone) .... Take 1/2 Tablet By Mouth Daily  Allergies: No Known Drug Allergies  Past History:  Past Medical History: Last updated: 06/11/2009 1. Diabetes mellitus, type II: 11/2006 2. Hyperlipidemia:04/1998 3. Hypertension: 1995 4. Benign prostatic hypertrophy:2004 5. Sinus tachycardia: Pt says he has been told his heart rate is high since he was in high school.  Holter monitor (7/10) showed frequent PACs and PVCs.  Average HR 96 (but he had started Toprol XL).  HR range 71-130.  6. OSA: not using CPAP.  7. Obesity. 8. Hip pain 9. Cardiomyopathy.  ? tachycardia-mediated.  Echo (7/10) showed mild LVH with a mildly dilated LV.  EF was difficult to assess given poor acoustic windows but appeared moderately decreased.  Mild MR.  Probable small perimembranous VSD, mild LAE.  Cardiac MRI was done to followup difficult echo (7/10): This showed mild to moderately dilated LV, mild to moderate LAE, EF 33% with global hypokinesis, normal RV size with mild systolic dysfunction.  There was no large VSD evident.  There was no myocardial delayed enhancement so no evidence for infiltrative disease or infarction.   LHC (7/10) showed only luminal irregularities in the coronaries and EF 35%.  RHC showed mean RA 8, PA 28/15, mean PCWP 10,  CI 3.2.  There was no step-up in oxygen saturation on the right side of the heart suggesting no hemodynamically significant VSD.   10.  Probable small perimembranous VSD.   Past Surgical History: Last updated: 06/10/2009 L ING HERNIORRAHPY 1985 LAMINECTOMY W/ DISCECTOMY 1987 R ING HERIORRHAPHY 1999 COLONOSCOPY, DIVERTICS, POLYPS X 3 ,BX NEGATIVE:(08/24/2006) CATH DILATED CARDIOMYOP  MIN VSD  HYPOKIN INF WALL   MULTIP MIN LUMINAL IRR  EF 35%  (05/30/2009)  Family History: Last updated: 11/27/2009 Father: dec  16  CABG, DM, STROKE  Mother: dec 88  Natural Causes  HTN, ALTZHEMIERS BROTHER A 67, DM SISTER A 66 DM, HTN SISTER A 63 DM, HTN CV:+CAGB FATHER HBP:+ MOTHERS ,SISTERS DM:+ BOTH SIDES , MGM(INSULIN) , FATHER PROSTATE/CANCER +GF, THROAT/ SMOKER /ETOH DEPRESSION: + SELF ETOH/DRUG ABUSE: NEGATIVE OTHER:+STROKE FATHER    Social History: Last updated: 11/27/2009 Marital Status: divorced. Occupation: [employer], MACHINIST Quit smoking in 2000.  Lives in [place].   Risk Factors: Alcohol Use: 1 (11/27/2009) Caffeine Use: 1 (11/27/2009) Exercise: no (11/27/2009)  Risk Factors: Smoking Status: quit (11/27/2009) Packs/Day: 07/1999 (11/27/2009) Passive Smoke Exposure: no (11/27/2009)  Family History: Father: dec  70  CABG, DM, STROKE  Mother: dec 88  Natural Causes  HTN, ALTZHEMIERS BROTHER A 67, DM SISTER A 66 DM, HTN SISTER A 63 DM, HTN CV:+CAGB FATHER HBP:+ MOTHERS ,SISTERS DM:+ BOTH SIDES , MGM(INSULIN) , FATHER PROSTATE/CANCER +GF, THROAT/ SMOKER /ETOH DEPRESSION: + SELF ETOH/DRUG ABUSE: NEGATIVE OTHER:+STROKE FATHER    Social History: Marital Status: divorced. Occupation: [employer], MACHINIST Quit smoking in 2000.  Lives in [place].   Review of Systems General:  Denies chills, fatigue, fever, sweats, weakness, and weight loss. Eyes:  Denies blurring, discharge, and eye pain. ENT:  Denies decreased hearing, earache, and  ringing in ears. CV:  Complains of shortness of breath with exertion; denies chest pain or discomfort, fainting, fatigue, leg cramps with exertion, lightheadness, and palpitations; mild...is out of shape.Marland Kitchen Resp:  Denies chest pain with inspiration, cough, shortness of breath, and wheezing. GI:  Denies abdominal pain, bloody stools, change in bowel habits, constipation, dark tarry stools, diarrhea, indigestion, loss of appetite, nausea, vomiting, vomiting blood, and yellowish skin color; alternates BM function at times, loose occas.. GU:  Complains of urinary frequency; denies discharge, dysuria, and nocturia. MS:  Complains of joint pain and cramps; denies muscle aches and stiffness; shoulder and hip.  Mild cramping.. Derm:  Denies dryness, itching, and rash. Neuro:  Denies numbness, tingling, and tremors; right arm occas..  Physical Exam  General:  Well-developed,well-nourished,in no acute distress; alert,appropriate and cooperative throughout examination. Head:  Normocephalic and atraumatic without obvious abnormalities. No apparent alopecia but with mild  balding. Eyes:  Conjunctiva clear bilaterally. Conjunctiva moist. Ears:  External  ear exam shows no significant lesions or deformities.  Otoscopic examination reveals clear canals, tympanic membranes are intact bilaterally without bulging, retraction, inflammation or discharge. Hearing is grossly normal bilaterally. Nose:  External nasal examination shows no deformity or inflammation. Nasal mucosa are pink and moist without lesions or exudates. Mouth:  Oral mucosa and oropharynx without lesions or exudates.  Teeth in good repair. Mucous membr moist. Neck:  No deformities, masses, or tenderness noted. Chest Wall:  No deformities, masses, tenderness or gynecomastia noted. Breasts:  No masses or gynecomastia noted Lungs:  Normal respiratory effort, chest expands symmetrically. Lungs are clear to auscultation, no crackles or wheezes. Heart:   Normal rate and regular rhythm. S1 and S2 normal without gallop, murmur, click, rub or other extra sounds.  Abdomen:  Bowel sounds positive and very active, abdomen soft and non-tender without masses, organomegaly or hernias noted. Mildly overweight. Rectal:  Not done...last done 2/10 Genitalia:  Same as above. Prostate:  Same as above. Msk:  No deformity or scoliosis noted of thoracic or lumbar spine.   Pulses:  R and L carotid,radial,femoral,dorsalis pedis and posterior tibial pulses are full and equal bilaterally Extremities:  No clubbing, cyanosis, edema, or deformity noted with normal full range of motion of all joints except shoulder and hip.  Neurologic:  No cranial nerve deficits noted. Station and gait are normal. Plantar reflexes are down-going bilaterally. DTRs are symmetrical throughout. Sensory, motor and coordinative functions appear intact. Skin:  Intact without suspicious lesions or rashes Cervical Nodes:  No lymphadenopathy noted Inguinal Nodes:  No significant adenopathy Psych:  Cognition and judgment appear intact. Alert and cooperative with normal attention span and concentration. No apparent delusions, illusions, hallucinations   Impression & Recommendations:  Problem # 1:  PREOPERATIVE EXAMINATION (ICD-V72.84) Assessment Comment Only Cleared for surgery pending cardiologic clearance which appears to have been done 08/26/2009.  Diabetes should not be a problem as he is fairly well controlled.  Problem # 2:  DIZZINESS (ICD-780.4) Assessment: Improved Resolved.  Problem # 3:  OBESITY (ICD-278.00) Assessment: Unchanged  Discussed.  Ht: 70 (06/11/2009)   Wt: 237 (11/27/2009)   BMI: 32.40 (06/11/2009)  Problem # 4:  ANXIETY DEPRESSION (ICD-300.4) Assessment: Unchanged Well controlled. Cont curr meds.  Problem # 5:  BENIGN PROSTATIC HYPERTROPHY (ICD-600.00) Assessment: Unchanged Stable.  Problem # 6:  HYPERTENSION (ICD-401.9) Assessment: Improved Well  controlled. Cont curr meds. His updated medication list for this problem includes:    Cardura 2 Mg Tabs (Doxazosin mesylate) .Marland Kitchen... 1tablet by mouth daily    Cozaar 100 Mg Tabs (Losartan potassium) ..... One tab by mouth at night; and 1/2 during the day    Carvedilol 25 Mg Tabs (Carvedilol) .Marland Kitchen... Take one tablet by mouth twice a day    Spironolactone 25 Mg Tabs (Spironolactone) .Marland Kitchen... Take 1/2 tablet by mouth daily  BP today: 110/70 Prior BP: 140/82 (10/22/2009)  Prior 10 Yr Risk Heart Disease: 18 % (12/05/2008)  Labs Reviewed: K+: 4.7 (06/11/2009) Creat: : 0.72 (06/11/2009)   Chol: 155 (12/13/2008)   HDL: 39.7 (12/13/2008)   LDL: 99 (12/13/2008)   TG: 84 (12/13/2008)  Problem # 7:  HYPERLIPIDEMIA (ICD-272.4) Assessment: Unchanged Adequate. Labs Reviewed: SGOT: 25 (12/13/2008)   SGPT: 27 (12/13/2008)  Prior 10 Yr Risk Heart Disease: 18 % (12/05/2008)   HDL:39.7 (12/13/2008), 76.9 (11/30/2006)  LDL:99 (12/13/2008), DEL (83/15/1761)  Chol:155 (12/13/2008), 230 (11/30/2006)  Trig:84 (12/13/2008), 143 (11/30/2006)  Problem # 8:  DIABETES MELLITUS, TYPE II (ICD-250.00) Assessment: Unchanged Good control. Cont curr meds.  Should heal well. His updated medication list for this problem includes:    Glucophage 500 Mg Tabs (Metformin hcl) .Marland Kitchen... Take 2  tabs by mouth two times a day    Cozaar 100 Mg Tabs (Losartan potassium) ..... One tab by mouth at night; and 1/2 during the day    Aspirin 81 Mg Tabs (Aspirin) .Marland Kitchen... Take 2 by mouth daily  Labs Reviewed: Creat: 0.72 (06/11/2009)     Last Eye Exam: normal (05/14/2009) Reviewed HgBA1c results: 6.3 (04/17/2009)  6.2 (01/14/2009)  Complete Medication List: 1)  Cardura 2 Mg Tabs (Doxazosin mesylate) .Marland Kitchen.. 1tablet by mouth daily 2)  Onetouch Ultra Test Strp (Glucose blood) .... Check daily and if blood sugar fluctuates  250.00 3)  Glucophage 500 Mg Tabs (Metformin hcl) .... Take 2  tabs by mouth two times a day 4)  Cozaar 100 Mg Tabs (Losartan  potassium) .... One tab by mouth at night; and 1/2 during the day 5)  Alka-seltzer Anti-gas 125 Mg Caps (Simethicone) .... As needed indigestion 6)  Vitamin D 1000 Unit Tabs (Cholecalciferol) .... One tab by mouth once daily 7)  Paroxetine Hcl 20 Mg Tabs (Paroxetine hcl) .Marland Kitchen.. 1 daily by mouth 8)  Aspirin 81 Mg Tabs (Aspirin) .... Take 2 by mouth daily 9)  Fish Oil 1000 Mg Caps (Omega-3 fatty acids) .Marland Kitchen.. 1 cap two times a day 10)  Carvedilol 25 Mg Tabs (Carvedilol) .... Take one tablet by mouth twice a day 11)  Spironolactone 25 Mg Tabs (Spironolactone) .... Take 1/2 tablet by mouth daily  Other Orders: EKG w/ Interpretation (93000) Cardiology Referral (Cardiology)  Patient Instructions: 1)  Refer to Cardiol for clearance...appears was done 08/2009. 2)  Should do well with diabetes as pt reasonably well controlled.  Current Allergies (reviewed today): No known allergies

## 2010-12-09 NOTE — Letter (Signed)
Summary: Request for Medical Clearance-[facility]  Request for Medical [facility]   Imported By: Beau Fanny 09/10/2010 11:46:40  _____________________________________________________________________  External Attachment:    Type:   Image     Comment:   External Document

## 2010-12-09 NOTE — Letter (Signed)
Summary: Surgical Clearance Request-Dr.Justin chandler,[facility]  Surgical Clearance Request-Dr.Justin chandler,[facility]   Imported By: Beau Fanny 11/28/2009 08:38:41  _____________________________________________________________________  External Attachment:    Type:   Image     Comment:   External Document

## 2010-12-09 NOTE — Assessment & Plan Note (Signed)
Summary: POST HOSP- [facility] .Marland KitchenCYD   Vital Signs:  Patient profile:   60 year old male Height:      70 inches Weight:      234.75 pounds BMI:     33.80 Temp:     98.3 degrees F oral Pulse rate:   88 / minute Pulse rhythm:   regular BP sitting:   136 / 80  (left arm) Cuff size:   large  Vitals Entered By: Delilah Shan CMA Duncan Dull) (September 26, 2010 4:19 PM) CC: [facility] Follow Up  (No DS posted on E-chart)   History of Present Illness: Hospital fu.  No discharge summary available.  Hosp recs reviwed.   Had hip surgery surgery and then had to be cathed.  Cr up to 1.8.  Low iron levels noted on labs.  BP meds held then gradually restarted- not on ACE/arb now.  Cr returned to baseline.   Had diarrhea on metformin and asking about options for DM2.    Needs follow up for low iron.  No h/o GIB/black stools.  H/o alcohol use, but no in last few months .  Allergies: No Known Drug Allergies  Past History:  Past Medical History: 1. Diabetes mellitus, type II: 11/2006 2. Hyperlipidemia:04/1998 3. Hypertension: 1995 4. Benign prostatic hypertrophy:2004 5. Sinus tachycardia: Pt says he has been told his heart rate is high since he was in high school.  Holter monitor (7/10) showed frequent PACs and PVCs.  Average HR 96 (but he had started Toprol XL).  HR range 71-130.  6. OSA: not using CPAP. 7. Obesity. 8. Hip pain 9. Cardiomyopathy.  ? tachycardia-mediated.  Echo (7/10) showed mild LVH with a mildly dilated LV.  EF was difficult to assess given poor acoustic windows but appeared moderately decreased.  Mild MR.  Probable small perimembranous VSD, mild LAE.  Cardiac MRI was done to followup difficult echo (7/10): This showed mild to moderately dilated LV, mild to moderate LAE, EF 33% with global hypokinesis, normal RV size with mild systolic dysfunction.  There was no large VSD evident.  There was no myocardial delayed enhancement so no evidence for infiltrative disease or infarction.   LHC  (7/10) showed only luminal irregularities in the coronaries and EF 35%.  RHC showed mean RA 8, PA 28/15, mean PCWP 10, CI 3.2.  There was no step-up in oxygen saturation on the right side of the heart suggesting no hemodynamically significant VSD.   Repeat echo in 2/11 after medical management of cardiomyopathy showed EF 40-45%, mildly dilated LV, mild LV hypertrophy, anterolateral and apical hypokinesis, mild diastolic dysfunction.  10.  Probable small perimembranous VSD.  11.  Rotator cuff surgery on left (2/11)  Past Surgical History: L ING HERNIORRAHPY 1985 LAMINECTOMY W/ DISCECTOMY 1987 R ING HERIORRHAPHY 1999 COLONOSCOPY, DIVERTICS, POLYPS X 3 ,BX NEGATIVE:(08/24/2006) CATH DILATED CARDIOMYOP  MIN VSD  HYPOKIN INF WALL   MULTIP MIN LUMINAL IRR  EF 35%  (05/30/2009) L hip replacement 2011  Review of Systems       See HPI.  Otherwise negative.    Physical Exam  General:  GEN: nad, alert and oriented, using walker HEENT: mucous membranes moist NECK: supple w/o LA CV: rrr PULM: ctab, no inc wob ABD: soft, +bs EXT: no edema SKIN: no acute rash, incision on hip is healing without discharge/erythema   Impression & Recommendations:  Problem # 1:  DIABETES MELLITUS, TYPE II (ICD-250.00) Start glipizide and call back with update on sugar readings.  We may  need to increase to 10mg  a day .  he is aware of potential hypoglycemia and he may need insulin.  We can gradually add back an ACE or ARB.  No change today in BP meds.  >25 min spent with patient, at least half of which was spent on counseling re:dx and plan.  The following medications were removed from the medication list:    Glucophage 500 Mg Tabs (Metformin hcl) .Marland Kitchen... Take 2  tabs by mouth two times a day    Cozaar 100 Mg Tabs (Losartan potassium) .Marland Kitchen... 1 by mouth daily and 1/2 at night. His updated medication list for this problem includes:    Aspirin 81 Mg Tabs (Aspirin) .Marland Kitchen... Take 2 by mouth daily    Glipizide 5 Mg Xr24h-tab  (Glipizide) .Marland Kitchen... 1 by mouth each morning.  Problem # 2:  ANEMIA, IRON DEFICIENCY (ICD-280.9) refer to GI.  His updated medication list for this problem includes:    Ferrous Sulfate 325 (65 Fe) Mg Tabs (Ferrous sulfate) .Marland Kitchen... Take 1 tablet by mouth two times a day  Orders: Gastroenterology Referral (GI)  Complete Medication List: 1)  Onetouch Ultra Test Strp (Glucose blood) .... Check daily and if blood sugar fluctuates  250.00 2)  Alka-seltzer Anti-gas 125 Mg Caps (Simethicone) .... As needed indigestion 3)  Vitamin D 1000 Unit Tabs (Cholecalciferol) .... One tab by mouth once daily 4)  Paroxetine Hcl 20 Mg Tabs (Paroxetine hcl) .Marland Kitchen.. 1 daily by mouth 5)  Aspirin 81 Mg Tabs (Aspirin) .... Take 2 by mouth daily 6)  Fish Oil 1000 Mg Caps (Omega-3 fatty acids) .Marland Kitchen.. 1 cap two times a day 7)  Carvedilol 25 Mg Tabs (Carvedilol) .... Take one tablet by mouth twice a day 8)  Spironolactone 25 Mg Tabs (Spironolactone) .... Take 1 tablet by mouth daily 9)  Warfarin Sodium 5 Mg Tabs (Warfarin sodium) .... Take 1 tablet by mouth once a day 10)  Ferrous Sulfate 325 (65 Fe) Mg Tabs (Ferrous sulfate) .... Take 1 tablet by mouth two times a day 11)  Lovenox 40 Mg/0.68ml Soln (Enoxaparin sodium) .... Had an injection tuesday. 12)  Methocarbamol 500 Mg Tabs (Methocarbamol) .... Take 1 tablet every 6 hours as needed for spasms. 13)  Percocet 10-325 Mg Tabs (Oxycodone-acetaminophen) .... Take 1 or 2 tablets by mouth every 4 hours as needed 14)  Glipizide 5 Mg Xr24h-tab (Glipizide) .Marland Kitchen.. 1 by mouth each morning.  Patient Instructions: 1)  Start the glipizide tomorrow.  Take 1 tab each morning.  Check your sugar before you eat breakfast and call me with the numbers on Tuesday next week.  We can talk about the dose at that point.  If you have low sugars and get shaky, stop the medicine and eat a snack.   2)  See Shirlee Limerick about your referral before your leave today.   3)  I want you to plan on coming back to see me  in early December.  We can talk about your BP meds at that point.  30 min appointment. 4)  Take care.  Glad to see you.  Prescriptions: GLIPIZIDE 5 MG XR24H-TAB (GLIPIZIDE) 1 by mouth each morning.  #30 x 5   Entered and Authorized by:   Crawford Givens MD   Signed by:   Crawford Givens MD on 09/26/2010   Method used:   [facility] to        [facility]  [address] [phone] (retail)       [address]  [place], [place]  [address]       Ph: [phone]       Fax: [phone]   RxID:   [id number]    Orders Added: 1)  Est. Patient Level IV [01601] 2)  Gastroenterology Referral [GI]     Orders Added: 1)  Est. Patient Level IV [09323] 2)  Gastroenterology Referral [GI]   Current Allergies (reviewed today): No known allergies

## 2010-12-09 NOTE — Progress Notes (Signed)
Summary: BP check  Phone Note Outgoing Call   Call placed by: Benedict Needy, RN,  August 22, 2010 9:42 AM Call placed to: Patient Summary of Call: 2 week BP check. No answer after 10+ rings.  Initial call taken by: Benedict Needy, RN,  August 22, 2010 9:42 AM  Follow-up for Phone Call        [facility] TCB Benedict Needy, RN  August 25, 2010 10:33 AM   [facility] TCB Benedict Needy, RN  August 26, 2010 3:59 PM   136/78-121, 135/80-109, 139/90-100, 147/99-123, 134/83-121, 125/81-110 Pt started on amolodipine 5mg  2 week BP check. Please advise.  Follow-up by: Benedict Needy, RN,  August 27, 2010 4:46 PM     Appended Document: BP check continue current meds.   Appended Document: BP check LMOM TCB  Appended Document: BP check LMOM TCB  Appended Document: BP check pt aware

## 2010-12-09 NOTE — Letter (Signed)
Summary: Nadara Eaton letter  [place] [facility]  [address] [place], [place] [address]   Phone: [phone]  Fax: [phone]       06/17/2010 MRN: [id number]  [facility] [address] [place], [place]  [address]  Dear Mr. [facility] [employer], and [place] announce the retirement of Arta Silence, M.D., from full-time practice at the [facility] office effective May 08, 2010 and his plans of returning part-time.  It is important to Dr. Hetty Ely and to our practice that you understand that [facility] has seven physicians in our office for your health care needs.  We will continue to offer the same exceptional care that you have today.    Dr. Hetty Ely has spoken to many of you about his plans for retirement and returning part-time in the fall.   We will continue to work with you through the transition to schedule appointments for you in the office and meet the high standards that [place] is committed to.   Again, it is with great pleasure that we share the news that Dr. Hetty Ely will return to [facility] in October of 2011 with a reduced schedule.    If you have any questions, or would like to request an appointment with one of our physicians, please call us at [phone] and press the option for Scheduling an appointment.  We take pleasure in providing you with excellent patient care and look forward to seeing you at your next office visit.  Our [facility] Physicians are:  Tillman Abide, M.D. Laurita Quint, M.D. Roxy Manns, M.D. Kerby Nora, M.D. Hannah Beat, M.D. Ruthe Mannan, M.D. We proudly welcomed Raechel Ache, M.D. and Eustaquio Boyden, M.D. to the practice in July/August 2011.  Sincerely,  [facility]

## 2010-12-09 NOTE — Assessment & Plan Note (Signed)
Summary: F6M/AMD   Visit Type:  Follow-up Primary Provider:  Dr. Para March  CC:  c/o some swelling in legs and ankles. Blood pressure has been running high.  He is going to have a hip replacement in December 2011.Marland Kitchen  History of Present Illness: 60 yo with history of HTN, DM, and nonischemic cardiomyopathy presents for followup.  He continues to do well with minimal cardiopulmonary symptoms.  He is very limited by his left hip (severe OA) but not by shortness of breath.   Left THR is planned for 12/11.  He had right rotator cuff surgery in 2/11 which he tolerated well.  He will get a little fatigued after working in the garden for about 30 minutes on a hot day.  He has some swelling in his ankles at the end of the work day but this will resolve by the time he wakes up in the morning.  No chest pain. BP today is 130/90, but he has been worried lately because he has seen his BP as high as 180/100 on his home cuff (seems to be trending upwards).   Labs (8/10): K 4.7, creatinine 0.7 Labs (3/11): K 4.7, creatinine 0.98  ECG: NSR, normal  Current Medications (verified): 1)  Cardura 2 Mg Tabs (Doxazosin Mesylate) .Marland Kitchen.. 1tablet By Mouth Daily 2)  Onetouch Ultra Test  Strp (Glucose Blood) .... Check Daily and If Blood Sugar Fluctuates  250.00 3)  Glucophage 500 Mg Tabs (Metformin Hcl) .... Take 2  Tabs By Mouth Two Times A Day 4)  Cozaar 100 Mg Tabs (Losartan Potassium) .Marland Kitchen.. 1 By Mouth Daily and 1/2 At Night. 5)  Alka-Seltzer Anti-Gas 125 Mg Caps (Simethicone) .... As Needed Indigestion 6)  Vitamin D 1000 Unit Tabs (Cholecalciferol) .... One Tab By Mouth Once Daily 7)  Paroxetine Hcl 20 Mg Tabs (Paroxetine Hcl) .Marland Kitchen.. 1 Daily By Mouth 8)  Aspirin 81 Mg  Tabs (Aspirin) .... Take 2 By Mouth Daily 9)  Fish Oil 1000 Mg Caps (Omega-3 Fatty Acids) .Marland Kitchen.. 1 Cap Two Times A Day 10)  Carvedilol 25 Mg Tabs (Carvedilol) .... Take One Tablet By Mouth Twice A Day 11)  Spironolactone 25 Mg Tabs (Spironolactone) .... Take  1 Tablet By Mouth Daily  Allergies (verified): No Known Drug Allergies  Past History:  Past Surgical History: Last updated: 06/10/2009 L ING HERNIORRAHPY 1985 LAMINECTOMY W/ DISCECTOMY 1987 R ING HERIORRHAPHY 1999 COLONOSCOPY, DIVERTICS, POLYPS X 3 ,BX NEGATIVE:(08/24/2006) CATH DILATED CARDIOMYOP  MIN VSD  HYPOKIN INF WALL   MULTIP MIN LUMINAL IRR  EF 35%  (05/30/2009)  Family History: Last updated: 11/27/2009 Father: dec  62  CABG, DM, STROKE  Mother: dec 88  Natural Causes  HTN, ALTZHEMIERS BROTHER A 67, DM SISTER A 66 DM, HTN SISTER A 63 DM, HTN CV:+CAGB FATHER HBP:+ MOTHERS ,SISTERS DM:+ BOTH SIDES , MGM(INSULIN) , FATHER PROSTATE/CANCER +GF, THROAT/ SMOKER /ETOH DEPRESSION: + SELF ETOH/DRUG ABUSE: NEGATIVE OTHER:+STROKE FATHER    Social History: Last updated: 11/27/2009 Marital Status: divorced. Occupation: [employer], MACHINIST Quit smoking in 2000.  Lives in [place].   Risk Factors: Alcohol Use: 1 (11/27/2009) Caffeine Use: 1 (11/27/2009) Exercise: no (11/27/2009)  Risk Factors: Smoking Status: quit (11/27/2009) Packs/Day: 07/1999 (11/27/2009) Passive Smoke Exposure: no (11/27/2009)  Past Medical History: 1. Diabetes mellitus, type II: 11/2006 2. Hyperlipidemia:04/1998 3. Hypertension: 1995 4. Benign prostatic hypertrophy:2004 5. Sinus tachycardia: Pt says he has been told his heart rate is high since he was in high school.  Holter monitor (7/10) showed frequent  PACs and PVCs.  Average HR 96 (but he had started Toprol XL).  HR range 71-130.  6. OSA: not using CPAP. 7. Obesity. 8. Hip pain 9. Cardiomyopathy.  ? tachycardia-mediated.  Echo (7/10) showed mild LVH with a mildly dilated LV.  EF was difficult to assess given poor acoustic windows but appeared moderately decreased.  Mild MR.  Probable small perimembranous VSD, mild LAE.  Cardiac MRI was done to followup difficult echo (7/10): This showed mild to moderately dilated LV, mild to  moderate LAE, EF 33% with global hypokinesis, normal RV size with mild systolic dysfunction.  There was no large VSD evident.  There was no myocardial delayed enhancement so no evidence for infiltrative disease or infarction.   LHC (7/10) showed only luminal irregularities in the coronaries and EF 35%.  RHC showed mean RA 8, PA 28/15, mean PCWP 10, CI 3.2.  There was no step-up in oxygen saturation on the right side of the heart suggesting no hemodynamically significant VSD.   Repeat echo in 2/11 after medical management of cardiomyopathy showed EF 40-45%, mildly dilated LV, mild LV hypertrophy, anterolateral and apical hypokinesis, mild diastolic dysfunction.  10.  Probable small perimembranous VSD.  11.  Rotator cuff surgery on left (2/11)  Family History: Reviewed history from 11/27/2009 and no changes required. Father: dec  52  CABG, DM, STROKE  Mother: dec 88  Natural Causes  HTN, ALTZHEMIERS BROTHER A 67, DM SISTER A 66 DM, HTN SISTER A 63 DM, HTN CV:+CAGB FATHER HBP:+ MOTHERS ,SISTERS DM:+ BOTH SIDES , MGM(INSULIN) , FATHER PROSTATE/CANCER +GF, THROAT/ SMOKER /ETOH DEPRESSION: + SELF ETOH/DRUG ABUSE: NEGATIVE OTHER:+STROKE FATHER    Social History: Reviewed history from 11/27/2009 and no changes required. Marital Status: divorced. Occupation: [employer], MACHINIST Quit smoking in 2000.  Lives in [place].   Review of Systems       All systems reviewed and negative except as per HPI.   Vital Signs:  Patient profile:   60 year old male Height:      70 inches Weight:      240 pounds BMI:     34.56 Pulse rate:   100 / minute BP sitting:   130 / 90  (left arm) Cuff size:   large  Vitals Entered By: Bishop Dublin, CMA (August 06, 2010 2:40 PM)  Physical Exam  General:  Well developed, well nourished, in no acute distress. Obese.  Neck:  Neck supple, no JVD. No masses, thyromegaly or abnormal cervical nodes. Lungs:  Clear bilaterally to auscultation  and percussion. Heart:  Non-displaced PMI, chest non-tender; regular rate and rhythm, S1, S2 without murmurs, rubs or gallops. Carotid upstroke normal, no bruit. Pedals normal pulses. Trace ankle edema.  Abdomen:  Bowel sounds positive; abdomen soft and non-tender without masses, organomegaly, or hernias noted. No hepatosplenomegaly. Extremities:  No clubbing or cyanosis. Neurologic:  Alert and oriented x 3. Psych:  Normal affect.   Impression & Recommendations:  Problem # 1:  CARDIOMYOPATHY, DILATED (ICD-425.4) Nonischemic dilated cardiomyopathy, EF 33% by MRI and 35% by cath.  No definite evidence for infiltrative disease on cardiac MRI and no CAD on cath.  Repeat echo in 2/11 after medical treatment has shown some improvement with EF 40-45%.  He has a probable small, hemodynamically insignificant VSD that does not explain his cardiomyopathy.  It may be that long-standing sinus tachycardia contributed to his cardiomyopathy (tachycardia-mediated).  HR is in the 90s now on carvedilol.  - Continue current doses of losartan, carvedilol,  and spironolactone.  - No indication for ICD at this point.  - Echo to followup on LV fxn in 2/12.  - Check BMET.   Problem # 2:  HYPERTENSION (ICD-401.9) BP running higher at home.  Suspect this is due to weight gain over the past few months now that he is not as active with hip pain.  Will add amlodipine 5 mg daily.  BP check in 2 wks.    Problem # 3:  HYPERLIPIDEMIA (ICD-272.4) Check lipids/LFTs.   Other Orders: Future Orders: Echocardiogram (Echo) ... 01/08/2011  Patient Instructions: 1)  Your physician recommends that you return for a FASTING lipid profile: At your convience (BMP/LIP/LFT/BNP) 2)  Your physician has recommended you make the following change in your medication: AMOLDIPINE  3)  Your physician wants you to follow-up in:  6 MONTHS You will receive a reminder letter in the mail two months in advance. If you don't receive a letter, please  call our office to schedule the follow-up appointment. 4)  Your physician has requested that you have an echocardiogram.  Echocardiography is a painless test that uses sound waves to create images of your heart. It provides your doctor with information about the size and shape of your heart and how well your heart's chambers and valves are working.  This procedure takes approximately one hour. There are no restrictions for this procedure. Prescriptions: SPIRONOLACTONE 25 MG TABS (SPIRONOLACTONE) Take 1 tablet by mouth daily  #30 x 6   Entered by:   Benedict Needy, RN   Authorized by:   Marca Ancona, MD   Signed by:   Benedict Needy, RN on 08/06/2010   Method used:   [id number]       [address]       [place], [place]  [address]       Ph: [phone]       Fax: [phone]   RxID:   [id number] AMLODIPINE BESYLATE 5 MG TABS (AMLODIPINE BESYLATE) Take one tablet by mouth daily  #30 x 6   Entered by:   Benedict Needy, RN   Authorized by:   Marca Ancona, MD   Signed by:   Benedict Needy, RN on 08/06/2010   Method used:   [facility]        [id number]       [address]       [place], [place]  [address]       Ph: [phone]       Fax: [phone]   RxID:   [id number]
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
## 2010-12-09 NOTE — Progress Notes (Signed)
Summary: cardiac clearance  ---- Converted from flag ---- ---- 08/25/2010 10:56 AM, Marca Ancona, MD wrote: Rip Harbour, continue cardiac meds  ---- 08/25/2010 10:23 AM, Benedict Needy, RN wrote: Pt last seen 08/06/10. [facility] is requesting cardiac clearance for up coming total hip replacement. Please advise. ------------------------------

## 2010-12-09 NOTE — Letter (Signed)
Summary: [facility]  Architectural technologist at [facility] [address]   [place], [place] [address]   Phone: [phone]  Fax: [phone]     August 18, 2010 MRN: [id number]   [facility] [address] [place], [place]  [address]   Dear Tim Mccullough,  We have reviewed your cholesterol results.  They are as follows:     Total Cholesterol:    219 (Desirable: less than 200)       HDL  Cholesterol:     56  (Desirable: greater than 40 for men and 50 for women)       LDL Cholesterol:       132  (Desirable: less than 100 for low risk and less than 70 for moderate to high risk)       Triglycerides:       153  (Desirable: less than 150)  Our recommendations include: PLEASE CALL OFFICE FOR MEDICATION RECOMMENDATIONS   Call our office at the number listed above if you have any questions.  Lowering your LDL cholesterol is important, but it is only one of a large number of "risk factors" that may indicate that you are at risk for heart disease, stroke or other complications of hardening of the arteries.  Other risk factors include:   A.  Cigarette Smoking* B.  High Blood Pressure* C.  Obesity* D.   Low HDL Cholesterol (see yours above)* E.   Diabetes Mellitus (higher risk if your is uncontrolled) F.  Family history of premature heart disease G.  Previous history of stroke or cardiovascular disease    *These are risk factors YOU HAVE CONTROL OVER.  For more information, visit .  There is now evidence that lowering the TOTAL CHOLESTEROL AND LDL CHOLESTEROL can reduce the risk of heart disease.  The American Heart Association recommends the following guidelines for the treatment of elevated cholesterol:  1.  If there is now current heart disease and less than two risk factors, TOTAL CHOLESTEROL should be less than 200 and LDL CHOLESTEROL should be less than 100. 2.  If there is current heart disease or two or more risk factors, TOTAL CHOLESTEROL should be less  than 200 and LDL CHOLESTEROL should be less than 70.  A diet low in cholesterol, saturated fat, and calories is the cornerstone of treatment for elevated cholesterol.  Cessation of smoking and exercise are also important in the management of elevated cholesterol and preventing vascular disease.  Studies have shown that 30 to 60 minutes of physical activity most days can help lower blood pressure, lower cholesterol, and keep your weight at a healthy level.  Drug therapy is used when cholesterol levels do not respond to therapeutic lifestyle changes (smoking cessation, diet, and exercise) and remains unacceptably high.  If medication is started, it is important to have you levels checked periodically to evaluate the need for further treatment options.  Thank you,    [employer] Team

## 2010-12-09 NOTE — Assessment & Plan Note (Signed)
Summary: FOLLOW UP / LFW   Vital Signs:  Patient profile:   60 year old male Height:      70 inches Weight:      230.25 pounds BMI:     33.16 Temp:     98.5 degrees F oral Pulse rate:   84 / minute Pulse rhythm:   regular BP sitting:   110 / 90  (left arm) Cuff size:   large  Vitals Entered By: Delilah Shan CMA Duncan Dull) (October 14, 2010 11:11 AM) CC: Follow up   History of Present Illness: H/o low iron.  Has GI follow up on 11/24/09.    Diabetes:  Using medications without difficulties: yes, see below. Hypoglycemic episodes:occ, down to 70.   Hyperglycemic episodes: one reading of 300+  Feet problems: Blood Sugars averaging: 100-150 in AM eye exam within last year: no but follow up encouraged.   Working on diabetic diet.  Wheat bread.  Using sugar substitutes.    Hypertension: Using medication without problems or lightheadedness: yes Chest pain with exertion:no Edema:no Short of breath:no Average home BPs: similar to today on home check   Current Medications (verified): 1)  Onetouch Ultra Test  Strp (Glucose Blood) .... Check Daily and If Blood Sugar Fluctuates  250.00 2)  Alka-Seltzer Anti-Gas 125 Mg Caps (Simethicone) .... As Needed Indigestion 3)  Vitamin D 1000 Unit Tabs (Cholecalciferol) .... One Tab By Mouth Once Daily 4)  Paroxetine Hcl 20 Mg Tabs (Paroxetine Hcl) .Marland Kitchen.. 1 Daily By Mouth 5)  Aspirin 81 Mg  Tabs (Aspirin) .... Take 2 By Mouth Daily 6)  Fish Oil 1000 Mg Caps (Omega-3 Fatty Acids) .Marland Kitchen.. 1 Cap Two Times A Day 7)  Carvedilol 25 Mg Tabs (Carvedilol) .... Take One Tablet By Mouth Twice A Day 8)  Spironolactone 25 Mg Tabs (Spironolactone) .... Take 1 Tablet By Mouth Daily 9)  Ferrous Sulfate 325 (65 Fe) Mg Tabs (Ferrous Sulfate) .... Take 1 Tablet By Mouth Two Times A Day 10)  Methocarbamol 500 Mg Tabs (Methocarbamol) .... Take 1 Tablet Every 6 Hours As Needed For Spasms. 11)  Glipizide 5 Mg Xr24h-Tab (Glipizide) .Marland Kitchen.. 1 By Mouth Each Morning. 12)   Pravachol 40 Mg Tabs (Pravastatin Sodium) .Marland Kitchen.. 1 By Mouth At Bedtime  Allergies (verified): 1)  ! Metformin Hcl  Past History:  Past Medical History: Last updated: 09/26/2010 1. Diabetes mellitus, type II: 11/2006 2. Hyperlipidemia:04/1998 3. Hypertension: 1995 4. Benign prostatic hypertrophy:2004 5. Sinus tachycardia: Pt says he has been told his heart rate is high since he was in high school.  Holter monitor (7/10) showed frequent PACs and PVCs.  Average HR 96 (but he had started Toprol XL).  HR range 71-130.  6. OSA: not using CPAP. 7. Obesity. 8. Hip pain 9. Cardiomyopathy.  ? tachycardia-mediated.  Echo (7/10) showed mild LVH with a mildly dilated LV.  EF was difficult to assess given poor acoustic windows but appeared moderately decreased.  Mild MR.  Probable small perimembranous VSD, mild LAE.  Cardiac MRI was done to followup difficult echo (7/10): This showed mild to moderately dilated LV, mild to moderate LAE, EF 33% with global hypokinesis, normal RV size with mild systolic dysfunction.  There was no large VSD evident.  There was no myocardial delayed enhancement so no evidence for infiltrative disease or infarction.   LHC (7/10) showed only luminal irregularities in the coronaries and EF 35%.  RHC showed mean RA 8, PA 28/15, mean PCWP 10, CI 3.2.  There was no  step-up in oxygen saturation on the right side of the heart suggesting no hemodynamically significant VSD.   Repeat echo in 2/11 after medical management of cardiomyopathy showed EF 40-45%, mildly dilated LV, mild LV hypertrophy, anterolateral and apical hypokinesis, mild diastolic dysfunction.  10.  Probable small perimembranous VSD.  11.  Rotator cuff surgery on left (2/11)  Social History: Reviewed history from 11/27/2009 and no changes required. Marital Status: divorced. Occupation: [employer], MACHINIST Quit smoking in 2000.  Lives in [place].  no alcohol 2011  Review of Systems       See HPI.   Otherwise negative.    Physical Exam  General:  GEN: nad, alert and oriented, using cane, pace increased since last OV.  HEENT: mucous membranes moist NECK: supple w/o LA CV: rrr PULM: ctab, no inc wob ABD: soft, +bs EXT: no edema SKIN: no acute rash, incision on hip is healed without discharge/erythema  Diabetes Management Exam:    Foot Exam (with socks and/or shoes not present):       Sensory-Pinprick/Light touch:          Left medial foot (L-4): normal          Left dorsal foot (L-5): normal          Left lateral foot (S-1): normal          Right medial foot (L-4): normal          Right dorsal foot (L-5): normal          Right lateral foot (S-1): normal       Sensory-Monofilament:          Left foot: normal          Right foot: normal       Inspection:          Left foot: normal          Right foot: normal       Nails:          Left foot: normal          Right foot: normal   Impression & Recommendations:  Problem # 1:  ANEMIA, IRON DEFICIENCY (ICD-280.9) has follow up with GI pending.  His updated medication list for this problem includes:    Ferrous Sulfate 325 (65 Fe) Mg Tabs (Ferrous sulfate) .Marland Kitchen... Take 1 tablet by mouth two times a day  Problem # 2:  HYPERTENSION (ICD-401.9) No change in meds.  Cr prev normalized.  will not add back ARB given his low SBP.  His updated medication list for this problem includes:    Carvedilol 25 Mg Tabs (Carvedilol) .Marland Kitchen... Take one tablet by mouth twice a day    Spironolactone 25 Mg Tabs (Spironolactone) .Marland Kitchen... Take 1 tablet by mouth daily  Problem # 3:  DIABETES MELLITUS, TYPE II (ICD-250.00) >25 min spent with patient, at least half of which was spent on counseling MW:UXLK:  Recent sugars reviewed.  Occ lows.  No change in meds.  His sugars may improve after getting up and moving more.  Will recheck A1c in 1/12 (with other GI labs, should they need to be drawn) and then follow up.  No metformin given prev hx.  He understands the  plan.  We talked about DM2 diet today.  His updated medication list for this problem includes:    Aspirin 81 Mg Tabs (Aspirin) .Marland Kitchen... Take 2 by mouth daily    Glipizide 5 Mg Xr24h-tab (Glipizide) .Marland Kitchen... 1 by mouth each morning.  Complete Medication List: 1)  Onetouch Ultra Test Strp (Glucose blood) .... Check daily and if blood sugar fluctuates  250.00 2)  Alka-seltzer Anti-gas 125 Mg Caps (Simethicone) .... As needed indigestion 3)  Vitamin D 1000 Unit Tabs (Cholecalciferol) .... One tab by mouth once daily 4)  Paroxetine Hcl 20 Mg Tabs (Paroxetine hcl) .Marland Kitchen.. 1 daily by mouth 5)  Aspirin 81 Mg Tabs (Aspirin) .... Take 2 by mouth daily 6)  Fish Oil 1000 Mg Caps (Omega-3 fatty acids) .Marland Kitchen.. 1 cap two times a day 7)  Carvedilol 25 Mg Tabs (Carvedilol) .... Take one tablet by mouth twice a day 8)  Spironolactone 25 Mg Tabs (Spironolactone) .... Take 1 tablet by mouth daily 9)  Ferrous Sulfate 325 (65 Fe) Mg Tabs (Ferrous sulfate) .... Take 1 tablet by mouth two times a day 10)  Methocarbamol 500 Mg Tabs (Methocarbamol) .... Take 1 tablet every 6 hours as needed for spasms. 11)  Glipizide 5 Mg Xr24h-tab (Glipizide) .Marland Kitchen.. 1 by mouth each morning. 12)  Pravachol 40 Mg Tabs (Pravastatin sodium) .Marland Kitchen.. 1 by mouth at bedtime  Patient Instructions: 1)  Don't change your meds.  Let me know if you have persistent low or high sugar readings.   2)  I would recheck your A1c in 1/12 and then come see me after you see Dr. Russella Dar.  appointment.   3)  Take care.  4)  Don't change your blood pressure meds.    Orders Added: 1)  Est. Patient Level IV [16109]   Immunization History:  Influenza Immunization History:    Influenza:  historical (08/09/2010)   Immunization History:  Influenza Immunization History:    Influenza:  Historical (08/09/2010)  Current Allergies (reviewed today): ! METFORMIN HCL

## 2010-12-09 NOTE — Progress Notes (Signed)
Summary: CLEARANCE  Phone Note From Other Clinic Call back at [phone]   Caller: CATHY [facility]) Call For: [facility] Summary of Call: STILL NEEDING WRITTEN CARDIAC CLEARANCE-FAX # [phone]/09 HIP SURGERY Initial call taken by: Harlon Flor,  September 12, 2010 9:45 AM  Follow-up for Phone Call        Refaxed clearance that was faxed 08/27/10 Follow-up by: Benedict Needy, RN,  September 12, 2010 10:10 AM

## 2010-12-11 NOTE — Procedures (Signed)
Summary: Colonoscopy and biopsy   Colonoscopy  Procedure date:  08/24/2006  Findings:      Results: Diverticulosis.       Pathology:  Hyperplastic polyp.     Location:  [facility].    Procedures Next Due Date:    Colonoscopy: 09/2011 Patient Name: Tim Mccullough, Tim Mccullough MRN:  Procedure Procedures: Colonoscopy CPT: [address].    with biopsy. CPT: Q5068410.  Personnel: Endoscopist: Venita Lick. Russella Dar, MD, Clementeen Graham.  Referred By: Laurita Quint, MD.  Exam Location: Exam performed in Outpatient Clinic. Outpatient  Patient Consent: Procedure, Alternatives, Risks and Benefits discussed, consent obtained, from patient. Consent was obtained by the RN.  Indications  Increased Risk Screening: Family History of Polyps.  Comments: Brother with colon polyps. History  Current Medications: Patient is not currently taking Coumadin.  Pre-Exam Physical: Performed Aug 24, 2006. Cardio-pulmonary exam, Rectal exam, HEENT exam , Abdominal exam, Mental status exam WNL.  Comments: Pt. history reviewed/updated, physical exam performed prior to initiation of sedation?Yes Exam Exam: Extent of exam reached: Cecum, extent intended: Cecum.  The cecum was identified by appendiceal orifice and IC valve. Time to Cecum: 00:02: 11. Time for Withdrawl: 00:12:23. Colon retroflexion performed. ASA Classification: II. Tolerance: excellent.  Monitoring: Pulse and BP monitoring, Oximetry used. Supplemental O2 given.  Colon Prep Used MoviPrep for colon prep. Prep results: excellent.  Sedation Meds: Patient assessed and found to be appropriate for moderate (conscious) sedation. Fentanyl 75 mcg. given IV. Versed 8 mg. given IV.  Findings NORMAL EXAM: Cecum to Descending Colon.  - DIVERTICULOSIS: Sigmoid Colon. Not bleeding. ICD9: Diverticulosis: 562.10. Comments: mild.  MULTIPLE POLYPS: Rectum to Sigmoid Colon. minimum size 2 mm, maximum size 3 mm. Procedure:  biopsy without cautery, removed,  Polyp retrieved, 3 polyps Polyps sent to pathology. ICD9: Colon Polyps: 211.3.   Assessment  Diagnoses: 562.10: Diverticulosis.  211.3: Colon Polyps.   Events  Unplanned Interventions: No intervention was required.  Unplanned Events: There were no complications. Plans  Post Exam Instructions: Post sedation instructions given. No aspirin or non-steroidal containing medications: 2 weeks.  Medication Plan: Await pathology.  Patient Education: Patient given standard instructions for: Polyps. Diverticulosis.  Disposition: After procedure patient sent to recovery. After recovery patient sent home.  Scheduling/Referral: Colonoscopy, to [facility] T. Russella Dar, MD, [facility], around Aug 25, 2011.    This report was created from the original endoscopy report, which was reviewed and signed by the above listed endoscopist.    cc: Laurita Quint, MD       SP Surgical Pathology - STATUS: Final             By: Windy Kalata,      Perform Date: 16Oct07 00:01  Ordered By: Rica Records Date: 16Oct07 21:45  Facility: [facility]                               Department: CPATH  Service Report Text  [facility]   [address] [address]   [place], [place] [address]   Telephone [phone] or [phone] Fax [phone]    REPORT OF SURGICAL PATHOLOGY    Case #: [id number]   Patient Name: Tim Mccullough, Tim Mccullough.   Office Chart Number: [id number]    MRN: [id number]   Pathologist: Alden Server A. Delila Spence, MD   DOB/Age Feb 06, 1951 (Age: 60) Gender: M   Date Taken: 08/24/2006  Date Received: 08/24/2006    FINAL DIAGNOSIS    ***MICROSCOPIC EXAMINATION AND DIAGNOSIS***    SIGMOID COLON AND RECTUM, POLYP(S): HYPERPLASTIC POLYP(S). NO   ADENOMATOUS CHANGE OR MALIGNANCY IDENTIFIED.    kv   Date Reported: 08/25/2006 Lyn Hollingshead. Delila Spence, MD   *** Electronically Signed Out By [facility] ***    Clinical information   FX HX colon polyps, screening (jes)     specimen(s) obtained   Colon, polyp(s), sigmoid and rectum    Gross Description   Received in formalin are tan, soft tissue fragments that are   submitted in toto. Number: multiple.   Size: 0.2 to 0.3 cm One block (TA:jes, 08/25/06)    jes/

## 2010-12-11 NOTE — Letter (Signed)
Summary: Diabetic Instructions  [facility]  [address] [place], [place] [address]   Phone: [phone]  Fax: [phone]    Tim Mccullough [birth year] MRN: [id number]   _ x _   ORAL DIABETIC MEDICATION INSTRUCTIONS           (Glipizide) The day before your procedure:   Take your diabetic pill as you do normally  The day of your procedure:   Do not take your diabetic pill    We will check your blood sugar levels during the admission process and again in Recovery before discharging you home

## 2010-12-11 NOTE — Assessment & Plan Note (Signed)
Summary: IRON DEF. ANEMIA...LSW.   History of Present Illness Visit Type: Initial Consult Primary GI MD: Elie Goody MD [facility] Primary Provider: Crawford Givens, MD Chief Complaint: Iron def anemia x 3 months History of Present Illness:   This is a 60 year old male was found to have a normocytic anemia with a low iron saturation and low iron level in November. He had mild diarrhea in November, which abated with a change in his oral hypoglycemic medication. He notes frequent heartburn occurring 2-3 times per week, generally at night.   GI Review of Systems    Reports acid reflux and  heartburn.      Denies abdominal pain, belching, bloating, chest pain, dysphagia with liquids, dysphagia with solids, loss of appetite, nausea, vomiting, vomiting blood, weight loss, and  weight gain.      Reports change in bowel habits and  diarrhea.     Denies anal fissure, black tarry stools, constipation, diverticulosis, fecal incontinence, heme positive stool, hemorrhoids, irritable bowel syndrome, jaundice, light color stool, liver problems, rectal bleeding, and  rectal pain.   Current Medications (verified): 1)  Onetouch Ultra Test  Strp (Glucose Blood) .... Check Daily and If Blood Sugar Fluctuates  250.00 2)  Alka-Seltzer Anti-Gas 125 Mg Caps (Simethicone) .... As Needed Indigestion 3)  Vitamin D 1000 Unit Tabs (Cholecalciferol) .... One Tab By Mouth Once Daily 4)  Paroxetine Hcl 20 Mg Tabs (Paroxetine Hcl) .Marland Kitchen.. 1 Daily By Mouth 5)  Aspirin 81 Mg  Tabs (Aspirin) .... Take 2 By Mouth Daily 6)  Fish Oil 1000 Mg Caps (Omega-3 Fatty Acids) .Marland Kitchen.. 1 Cap Two Times A Day 7)  Carvedilol 25 Mg Tabs (Carvedilol) .... Take One Tablet By Mouth Twice A Day 8)  Spironolactone 25 Mg Tabs (Spironolactone) .... Take 1 Tablet By Mouth Daily 9)  Ferrous Sulfate 325 (65 Fe) Mg Tabs (Ferrous Sulfate) .... Take 1 Tablet By Mouth Two Times A Day 10)  Methocarbamol 500 Mg Tabs (Methocarbamol) .... Take 1 Tablet Every 6  Hours As Needed For Spasms. 11)  Glipizide 5 Mg Xr24h-Tab (Glipizide) .Marland Kitchen.. 1 By Mouth Each Morning. 12)  Pravachol 40 Mg Tabs (Pravastatin Sodium) .Marland Kitchen.. 1 By Mouth At Bedtime  Allergies (verified): 1)  ! Metformin Hcl  Past History:  Past Medical History: Reviewed history from 11/19/2010 and no changes required. 1. Diabetes mellitus, type II: 11/2006 2. Hyperlipidemia:04/1998 3. Hypertension: 1995 4. Benign prostatic hypertrophy:2004 5. Sinus tachycardia: Pt says he has been told his heart rate is high since he was in high school.  Holter monitor (7/10) showed frequent PACs and PVCs.  Average HR 96 (but he had started Toprol XL).  HR range 71-130.  6. OSA: not using CPAP. 7. Obesity. 8. Hip pain 9. Cardiomyopathy.  ? tachycardia-mediated.  Echo (7/10) showed mild LVH with a mildly dilated LV.  EF was difficult to assess given poor acoustic windows but appeared moderately decreased.  Mild MR.  Probable small perimembranous VSD, mild LAE.  Cardiac MRI was done to followup difficult echo (7/10): This showed mild to moderately dilated LV, mild to moderate LAE, EF 33% with global hypokinesis, normal RV size with mild systolic dysfunction.  There was no large VSD evident.  There was no myocardial delayed enhancement so no evidence for infiltrative disease or infarction.   LHC (7/10) showed only luminal irregularities in the coronaries and EF 35%.  RHC showed mean RA 8, PA 28/15, mean PCWP 10, CI 3.2.  There was no step-up in oxygen  saturation on the right side of the heart suggesting no hemodynamically significant VSD.   Repeat echo in 2/11 after medical management of cardiomyopathy showed EF 40-45%, mildly dilated LV, mild LV hypertrophy, anterolateral and apical hypokinesis, mild diastolic dysfunction.  10.  Probable small perimembranous VSD.  11.  Rotator cuff surgery on left (2/11) Diverticulosis  Past Surgical History: Reviewed history from 09/26/2010 and no changes required. L ING  HERNIORRAHPY 1985 LAMINECTOMY W/ DISCECTOMY 1987 R ING HERIORRHAPHY 1999 COLONOSCOPY, DIVERTICS, POLYPS X 3 ,BX NEGATIVE:(08/24/2006) CATH DILATED CARDIOMYOP  MIN VSD  HYPOKIN INF WALL   MULTIP MIN LUMINAL IRR  EF 35%  (05/30/2009) L hip replacement 2011  Family History: Reviewed history from 09/04/2010 and no changes required. Father: dec  36  CABG, DM, STROKE  Mother: dec 88  Natural Causes  HTN, ALTZHEMIERS BROTHER A 70, DM  Throat Cancer SISTER A 68 DM, HTN SISTER A 64 DM, HTN CV:+CAGB FATHER HBP:+ MOTHERS ,SISTERS DM:+ BOTH SIDES , MGM(INSULIN) , FATHER PROSTATE/CANCER +GF, THROAT/ SMOKER /ETOH DEPRESSION: + SELF ETOH/DRUG ABUSE: NEGATIVE OTHER:+STROKE FATHER    Social History: Reviewed history from 10/14/2010 and no changes required. Marital Status: divorced. Occupation: [employer], MACHINIST Quit smoking in 2000.  Lives in [place].  no alcohol 2011  Review of Systems       The patient complains of anemia, arthritis/joint pain, back pain, fatigue, heart rhythm changes, muscle pains/cramps, night sweats, sleeping problems, swelling of feet/legs, and urination changes/pain.         The pertinent positives and negatives are noted as above and in the HPI. All other ROS were reviewed and were negative.  Vital Signs:  Patient profile:   60 year old male Height:      71 inches Weight:      240.38 pounds BMI:     33.65 Pulse rate:   68 / minute Pulse rhythm:   regular BP sitting:   110 / 76  (left arm) Cuff size:   regular  Vitals Entered By: June McMurray CMA Duncan Dull) (November 24, 2010 9:14 AM)  Physical Exam  General:  Well developed, well nourished, no acute distress. Head:  Normocephalic and atraumatic. Eyes:  PERRLA, no icterus. Ears:  Normal auditory acuity. Mouth:  No deformity or lesions, dentition normal. Neck:  Supple; no masses or thyromegaly. Lungs:  Clear throughout to auscultation. Heart:  Regular rate and rhythm; no murmurs, rubs,   or bruits. Abdomen:  Soft, nontender and nondistended. No masses, hepatosplenomegaly or hernias noted. Normal bowel sounds. Rectal:  deferred until time of colonoscopy.   Msk:  Symmetrical with no gross deformities. Normal posture. Pulses:  Normal pulses noted. Extremities:  No clubbing, cyanosis, edema or deformities noted. Neurologic:  Alert and  oriented x4;  grossly normal neurologically. Cervical Nodes:  No significant cervical adenopathy. Inguinal Nodes:  No significant inguinal adenopathy. Psych:  Alert and cooperative. Normal mood and affect.  Impression & Recommendations:  Problem # 1:  ANEMIA, IRON DEFICIENCY (ICD-280.9) R/O GI lesions and celiac disease. Continue fe replacement. The risks, benefits and alternatives to colonoscopy with possible biopsy and possible polypectomy were discussed with the patient and they consent to proceed. The procedure will be scheduled electively. The risks, benefits and alternatives to endoscopy with possible biopsy and possible dilation were discussed with the patient and they consent to proceed. The procedure will be scheduled electively. Orders: TLB-CBC Platelet - w/Differential (85025-CBCD) TLB-IgA (Immunoglobulin A) (82784-IGA) T-Sprue Panel (Celiac Disease Aby Eval) (83516x3/[address]) Colon/Endo (Colon/Endo)  Problem #  2:  GERD (ICD-530.81) R/O esophagitis, Barretts. Begin antirelfux measures and Prilosec OTC qam. See above.  Patient Instructions: 1)  Please go directly to the basement to have your labs drawn.  2)  Pick up your prep from your pharmacy.  3)  Colonoscopy brochure given.  4)  Upper Endoscopy brochure given.  5)  Copy sent to : Crawford Givens, MD 6)  The medication list was reviewed and reconciled.  All changed / newly prescribed medications were explained.  A complete medication list was provided to the patient / caregiver.  Prescriptions: MOVIPREP 100 GM  SOLR (PEG-KCL-NACL-NASULF-NA ASC-C) As per prep instructions.  #1  x 0   Entered by:   Christie Nottingham CMA (AAMA)   Authorized by:   Meryl Dare MD [facility]   Signed by:   Christie Nottingham CMA (AAMA) on 11/24/2010   Method used:   [facility]  [address] [id number]       [address]       [place], [place]  [address]       Ph: [phone]       Fax: [phone]   RxID:   [id number]

## 2010-12-11 NOTE — Letter (Signed)
Summary: [facility] Instructions  [facility]  [address] [place], [place] [address]   Phone: [phone]  Fax: [phone]       HESSTON HITCHENS    June 05, 1951    MRN: [id number]        Procedure Day /Date: Monday January 30th, 2012     Arrival Time: 1:30pm     Procedure Time: 2:30pm     Location of Procedure:                    _x _   Endoscopy Center (4th Floor)                        PREPARATION FOR COLONOSCOPY WITH MOVIPREP   Starting 5 days prior to your procedure 12/03/10 do not eat nuts, seeds, popcorn, corn, beans, peas,  salads, or any raw vegetables.  Do not take any fiber supplements (e.g. Metamucil, Citrucel, and Benefiber).  THE DAY BEFORE YOUR PROCEDURE         DATE: 12/07/10  DAY: Sunday  1.  Drink clear liquids the entire day-NO SOLID FOOD  2.  Do not drink anything colored red or purple.  Avoid juices with pulp.  No orange juice.  3.  Drink at least 64 oz. (8 glasses) of fluid/clear liquids during the day to prevent dehydration and help the prep work efficiently.  CLEAR LIQUIDS INCLUDE: Water Jello Ice Popsicles Tea (sugar ok, no milk/cream) Powdered fruit flavored drinks Coffee (sugar ok, no milk/cream) Gatorade Juice: apple, white grape, white cranberry  Lemonade Clear bullion, consomm, broth Carbonated beverages (any kind) Strained chicken noodle soup Hard Candy                             4.  In the morning, mix first dose of MoviPrep solution:    Empty 1 Pouch A and 1 Pouch B into the disposable container    Add lukewarm drinking water to the top line of the container. Mix to dissolve    Refrigerate (mixed solution should be used within 24 hrs)  5.  Begin drinking the prep at 5:00 p.m. The MoviPrep container is divided by 4 marks.   Every 15 minutes drink the solution down to the next mark (approximately 8 oz) until the full liter is complete.   6.  Follow completed prep with 16 oz of clear liquid of your choice  (Nothing red or purple).  Continue to drink clear liquids until bedtime.  7.  Before going to bed, mix second dose of MoviPrep solution:    Empty 1 Pouch A and 1 Pouch B into the disposable container    Add lukewarm drinking water to the top line of the container. Mix to dissolve    Refrigerate  THE DAY OF YOUR PROCEDURE      DATE: 12/08/10 DAY: Monday  Beginning at 9:30 a.m. (5 hours before procedure):         1. Every 15 minutes, drink the solution down to the next mark (approx 8 oz) until the full liter is complete.  2. Follow completed prep with 16 oz. of clear liquid of your choice.    3. You may drink clear liquids until 12:30pm (2 HOURS BEFORE PROCEDURE).   MEDICATION INSTRUCTIONS  Unless otherwise instructed, you should take regular prescription medications with a small sip of water   as early as possible the morning of your  procedure.  Diabetic patients - see separate instructions.  Additional medication instructions: HOLD IRON 5 DAYS BEFORE YOUR PROCEDURE!!         OTHER INSTRUCTIONS  You will need a responsible adult at least 59 years of age to accompany you and drive you home.   This person must remain in the waiting room during your procedure.  Wear loose fitting clothing that is easily removed.  Leave jewelry and other valuables at home.  However, you may wish to bring a book to read or  an iPod/MP3 player to listen to music as you wait for your procedure to start.  Remove all body piercing jewelry and leave at home.  Total time from sign-in until discharge is approximately 2-3 hours.  You should go home directly after your procedure and rest.  You can resume normal activities the  day after your procedure.  The day of your procedure you should not:   Drive   Make legal decisions   Operate machinery   Drink alcohol   Return to work  You will receive specific instructions about eating, activities and medications before you leave.    The  above instructions have been reviewed and explained to me by   Marchelle Folks.     I fully understand and can verbalize these instructions _____________________________ Date _________

## 2010-12-15 ENCOUNTER — Encounter: Payer: Self-pay | Admitting: Gastroenterology

## 2010-12-16 ENCOUNTER — Ambulatory Visit: Payer: Self-pay | Admitting: Family Medicine

## 2010-12-17 NOTE — Miscellaneous (Signed)
Summary: omperazole-rx  Clinical Lists Changes  Medications: Added new medication of OMEPRAZOLE 40 MG  CPDR (OMEPRAZOLE) 1 each day 30 minutes before meal - Signed Rx of OMEPRAZOLE 40 MG  CPDR (OMEPRAZOLE) 1 each day 30 minutes before meal;  #34 x 11;  Signed;  Entered by: Greer Ee RN;  Authorized by: Meryl Dare MD [facility];  Method used: [facility] [address] [address] [place], [place], [place]  [address], Ph: [phone], Fax: [phone]    Prescriptions: OMEPRAZOLE 40 MG  CPDR (OMEPRAZOLE) 1 each day 30 minutes before meal  #34 x 11   Entered by:   Greer Ee RN   Authorized by:   Meryl Dare MD [facility]   Signed by:   Greer Ee RN on 12/08/2010   Method used:   [facility]        [facility]  [address] [id number]       [address]       [place], [place]  [address]       Ph: [phone]       Fax: [phone]   RxID:   [id number]

## 2010-12-17 NOTE — Procedures (Addendum)
Summary: Colonoscopy  Patient: Tim Mccullough Note: All result statuses are Final unless otherwise noted.  Tests: (1) Colonoscopy (COL)   COL Colonoscopy           DONE     [name]     [address]     [place], [place]  [address]           COLONOSCOPY PROCEDURE REPORT           PATIENT:  Tim, Mccullough  MR#:  [id number]     BIRTHDATE:  04-10-1951, 59 yrs. old  GENDER:  male     ENDOSCOPIST:  Judie Petit T. Russella Dar, MD, [facility]           PROCEDURE DATE:  12/08/2010     PROCEDURE:  Colonoscopy with snare polypectomy     ASA CLASS:  Class II     INDICATIONS:  1) Iron deficiency anemia  2) family Hx of polyps:     brother     MEDICATIONS:   Fentanyl 75 mcg IV, Versed 8 mg IV     DESCRIPTION OF PROCEDURE:   After the risks benefits and     alternatives of the procedure were thoroughly explained, informed     consent was obtained.  Digital rectal exam was performed and     revealed no abnormalities.   The LB [id number]-[id number] endoscope     was introduced through the anus and advanced to the cecum, which     was identified by both the appendix and ileocecal valve, without     limitations.  The quality of the prep was excellent, using     MoviPrep.  The instrument was then slowly withdrawn as the colon     was fully examined.           <<PROCEDUREIMAGES>>           FINDINGS:  A sessile polyp was found in the mid transverse colon.     It was 5 mm in size. Polyp was snared without cautery. Retrieval     was successful. Mild diverticulosis was found in the sigmoid     colon. A normal appearing cecum, ileocecal valve, and appendiceal     orifice were identified. The ascending, hepatic flexure, splenic     flexure, descending colon, and rectum appeared unremarkable.     Retroflexed views in the rectum revealed no abnormalities.  The     time to cecum =  2.25  minutes. The scope was then withdrawn (time     =  9  min) from the patient and the procedure completed.        COMPLICATIONS:  None           ENDOSCOPIC IMPRESSION:     1) 5 mm sessile polyp in the mid transverse colon     2) Mild diverticulosis in the sigmoid colon           ECOMMENDATIONS:     1) Await pathology results     2) High fiber diet with liberal fluid intake.     3) Repeat Colonoscopy in 5 years pending pathology review           Danitza Schoenfeldt T. Russella Dar, MD, [facility]           CC:  Crawford Givens, MD           n.     Rosalie DoctorVenita Lick. Derl Abalos at 12/08/2010 03:15 PM  [place], [place], [id number]  Note: An exclamation mark (!) indicates a result that was not dispersed into the flowsheet. Document Creation Date: 12/08/2010 3:15 PM _______________________________________________________________________  (1) Order result status: Final Collection or observation date-time: 12/08/2010 15:10 Requested date-time:  Receipt date-time:  Reported date-time:  Referring Physician:   Ordering Physician: Claudette Head [phone]) Specimen Source:  Source: Launa Grill Order Number: [id number] Lab site:   Appended Document: Colonoscopy     Procedures Next Due Date:    Colonoscopy: 11/2015  Appended Document: Colonoscopy    Clinical Lists Changes  Observations: Added new observation of PAST SURG HX: L ING HERNIORRAHPY 1985 LAMINECTOMY W/ DISCECTOMY 1987 R ING HERIORRHAPHY 1999 COLONOSCOPY, DIVERTICS, POLYPS X 3 ,BX NEGATIVE:(08/24/2006), repeat 2012 with mild diverticulosis in the sigmoid colon and 1 polyp in tranverse colon, repeat due 2017 CATH DILATED CARDIOMYOP  MIN VSD  HYPOKIN INF WALL   MULTIP MIN LUMINAL IRR  EF 35%  (05/30/2009) L hip replacement 2011 (12/17/2010 13:18)       Past History:  Past Surgical History: L ING HERNIORRAHPY 1985 LAMINECTOMY W/ DISCECTOMY 1987 R ING HERIORRHAPHY 1999 COLONOSCOPY, DIVERTICS, POLYPS X 3 ,BX NEGATIVE:(08/24/2006), repeat 2012 with mild diverticulosis in the sigmoid colon and 1 polyp in tranverse colon, repeat due 2017 CATH  DILATED CARDIOMYOP  MIN VSD  HYPOKIN INF WALL   MULTIP MIN LUMINAL IRR  EF 35%  (05/30/2009) L hip replacement 2011

## 2010-12-17 NOTE — Procedures (Addendum)
Summary: Upper Endoscopy  Patient: Tim Mccullough Note: All result statuses are Final unless otherwise noted.  Tests: (1) Upper Endoscopy (EGD)   EGD Upper Endoscopy       DONE     [facility]     [address]     [place], [place]  [address]           ENDOSCOPY PROCEDURE REPORT           PATIENT:  Tim, Mccullough  MR#:  [id number]     BIRTHDATE:  03/19/1951, 59 yrs. old  GENDER:  male     ENDOSCOPIST:  Judie Petit T. Russella Dar, MD, [facility]           PROCEDURE DATE:  12/08/2010     PROCEDURE:  EGD with biopsy, 86578     ASA CLASS:  Class II     INDICATIONS:  GERD, iron deficiency anemia     MEDICATIONS:  There was residual sedation effect present from     prior procedure., Fentanyl 25 mcg IV, Versed 2 mg IV     TOPICAL ANESTHETIC:  Exactacain Spray     DESCRIPTION OF PROCEDURE:   After the risks benefits and     alternatives of the procedure were thoroughly explained, informed     consent was obtained.  The LB GIF-H180 [id number] endoscope was     introduced through the mouth and advanced to the second portion of     the duodenum, without limitations.  The instrument was slowly     withdrawn as the mucosa was fully examined.           <<PROCEDUREIMAGES>>           Barrett's esophagus was found in the distal esophagus. It was 7 cm     in length, 32-39 cm. Four quadrant biopsies were taken every 2 cm     through the length of the barretts.  Two erosions were found in     the mid esophagus. They were linear. LA Class Grade B. Mild     gastritis was found in the antrum. It was erosive. Duodenitis was     found in the bulb of the duodenum. It was erythematous and     erosive. The descending duodenum was normal in appearance. Random     biopsies were obtained and sent to pathology. Otherwise the     examination was normal. Retroflexed views revealed a hiatal     hernia, small. The scope was then withdrawn from the patient and     the procedure completed.           COMPLICATIONS:   None           ENDOSCOPIC IMPRESSION:     1) Barrett's esophagus     2) Erosive esophagitis     3) Mild gastritis in the antrum     4) Duodenitis in the bulb of duodenum     5) Small hiatal hernia           RECOMMENDATIONS:     1) Anti-reflux regimen long term     2) PPI qam long term: omperazole 40mg  po qam, #34, 11 refills     3) Await pathology results     4) Follow up EGD for Barretts suveillance in 2 years if no     dysplasia noted           Eydie Wormley T. Russella Dar, MD, [facility]  CC:  Crawford Givens, MD           n.     Rosalie DoctorVenita Lick. Monnie Gudgel at 12/08/2010 03:32 PM           [name], [name], [id number]  Note: An exclamation mark (!) indicates a result that was not dispersed into the flowsheet. Document Creation Date: 12/08/2010 3:32 PM _______________________________________________________________________  (1) Order result status: Final Collection or observation date-time: 12/08/2010 15:23 Requested date-time:  Receipt date-time:  Reported date-time:  Referring Physician:   Ordering Physician: Claudette Head [id number]) Specimen Source:  Source: Launa Grill Order Number: [id number] Lab site:   Appended Document: Upper Endoscopy     Procedures Next Due Date:    EGD: 11/2012  Appended Document: Upper Endoscopy    Clinical Lists Changes  Observations: Added new observation of PAST SURG HX: L ING HERNIORRAHPY 1985 LAMINECTOMY W/ DISCECTOMY 1987 R ING HERIORRHAPHY 1999 COLONOSCOPY, DIVERTICS, POLYPS X 3 ,BX NEGATIVE:(08/24/2006), repeat 2012 with mild diverticulosis in the sigmoid colon and 1 polyp in tranverse colon, repeat due 2017 CATH DILATED CARDIOMYOP  MIN VSD  HYPOKIN INF WALL   MULTIP MIN LUMINAL IRR  EF 35%  (05/30/2009) L hip replacement 2011 EGD 2012 with  1) Barrett's esophagus, 2) Erosive esophagitis, 3) Mild gastritis in the antrum, 4) Duodenitis in the bulb of duodenum, 5) Small hiatal hernia (12/17/2010 13:19)       Past History:  Past  Surgical History: L ING HERNIORRAHPY 1985 LAMINECTOMY W/ DISCECTOMY 1987 R ING HERIORRHAPHY 1999 COLONOSCOPY, DIVERTICS, POLYPS X 3 ,BX NEGATIVE:(08/24/2006), repeat 2012 with mild diverticulosis in the sigmoid colon and 1 polyp in tranverse colon, repeat due 2017 CATH DILATED CARDIOMYOP  MIN VSD  HYPOKIN INF WALL   MULTIP MIN LUMINAL IRR  EF 35%  (05/30/2009) L hip replacement 2011 EGD 2012 with  1) Barrett's esophagus, 2) Erosive esophagitis, 3) Mild gastritis in the antrum, 4) Duodenitis in the bulb of duodenum, 5) Small hiatal hernia

## 2010-12-19 ENCOUNTER — Ambulatory Visit (INDEPENDENT_AMBULATORY_CARE_PROVIDER_SITE_OTHER): Payer: 59 | Admitting: Family Medicine

## 2010-12-19 ENCOUNTER — Encounter: Payer: Self-pay | Admitting: Family Medicine

## 2010-12-19 DIAGNOSIS — E119 Type 2 diabetes mellitus without complications: Secondary | ICD-10-CM

## 2010-12-19 DIAGNOSIS — I10 Essential (primary) hypertension: Secondary | ICD-10-CM

## 2010-12-19 DIAGNOSIS — D509 Iron deficiency anemia, unspecified: Secondary | ICD-10-CM

## 2010-12-22 ENCOUNTER — Encounter: Payer: Self-pay | Admitting: Family Medicine

## 2010-12-22 ENCOUNTER — Telehealth: Payer: Self-pay | Admitting: Family Medicine

## 2010-12-23 ENCOUNTER — Telehealth (INDEPENDENT_AMBULATORY_CARE_PROVIDER_SITE_OTHER): Payer: Self-pay | Admitting: *Deleted

## 2010-12-24 LAB — CONVERTED CEMR LAB
ALT: 15 units/L (ref 0–53)
AST: 17 units/L (ref 0–37)
Albumin: 4.3 g/dL (ref 3.5–5.2)
Alkaline Phosphatase: 103 units/L (ref 39–117)
Calcium: 8.9 mg/dL (ref 8.4–10.5)
Chloride: 102 meq/L (ref 96–112)
Potassium: 4.2 meq/L (ref 3.5–5.3)
Sodium: 139 meq/L (ref 135–145)
Total Protein: 7 g/dL (ref 6.0–8.3)

## 2010-12-25 NOTE — Letter (Signed)
Summary: Patient Notice-Barrett's [facility]  [address] [place], [place] [address]   Phone: [phone]  Fax: [phone]        December 15, 2010 MRN: [id number]    [facility] [address] [place], [place]  [address]    Dear Mr. Behl,  I am pleased to inform you that the biopsies taken during your recent endoscopic examination did not show any evidence of cancer upon pathologic examination.  However, your biopsies indicate you have a condition known as Barrett's esophagus. While not cancer, it is pre-cancerous (can progress to cancer) and needs to be monitored with repeat endoscopic examination and biopsies.  Fortunately, it is quite rare that this develops into cancer, but careful monitoring of the condition along with taking your medication as prescribed is important in reducing the risk of developing cancer.  It is my recommendation that you have a repeat upper gastrointestinal endoscopic examination in 2 years.  Continue with treatment plan as outlined the day of your exam.  Please call us if you have or develop heartburn, reflux symptoms, any swallowing problems, or if you have questions about your condition that have not been fully answered at this time.  Sincerely,  Meryl Dare MD [facility]  This letter has been electronically signed by your physician.  Appended Document: Patient Notice-Barrett's Esopghagus LETTER MAILED

## 2010-12-25 NOTE — Letter (Signed)
Summary: Patient Notice- Colon Biospy Results  [facility]  [address] [place], [place] [address]   Phone: [phone]  Fax: [phone]        December 15, 2010 MRN: [id number]    [facility] [address] [place], [place]  [address]    Dear Mr. Grayer,  I am pleased to inform you that the biopsies taken during your recent colonoscopy did not show any evidence of cancer upon pathologic examination. The colon biopsies did not reveal nay precancerous polyps.  Continue with the treatment plan as outlined on the day of your      exam.  You should have a repeat colonoscopy examination in 5 years.  Please call us if you are having persistent problems or have questions about your condition that have not been fully answered at this time.  Sincerely,  Meryl Dare MD [facility]  This letter has been electronically signed by your physician.  Appended Document: Patient Notice- Colon Biospy Results LETTER MAILED

## 2010-12-31 NOTE — Miscellaneous (Signed)
Summary: Lisinopril  Clinical Lists Changes  Medications: Added new medication of LISINOPRIL 2.5 MG TABS (LISINOPRIL) Take 1 tablet by mouth once a day - Signed Rx of LISINOPRIL 2.5 MG TABS (LISINOPRIL) Take 1 tablet by mouth once a day;  #90 x 3;  Signed;  Entered by: Delilah Shan CMA (AAMA);  Authorized by: Crawford Givens MD;  Method used: Electron[facility] to [id number] [address] [place], [place], [place]  [address], Ph: [phone], Fax: [phone]    Prescriptions: LISINOPRIL 2.5 MG TABS (LISINOPRIL) Take 1 tablet by mouth once a day  #90 x 3   Entered by:   Delilah Shan CMA (AAMA)   Authorized by:   Crawford Givens MD   Signed by:   Delilah Shan CMA (AAMA) on 12/22/2010   Method used:   [facility]  [address] [phone][facility] (retail)       [address]       [place], [place]  [address]       Ph: [phone]       Fax: [phone]   RxID:   [id number]

## 2010-12-31 NOTE — Progress Notes (Signed)
----   Converted from flag ---- ---- 12/22/2010 8:32 AM, Meryl Dare MD [facility] wrote: Maybe another month. Recheck CBC and Fe/TIBC to confirm  ---- 12/21/2010 4:08 PM, Crawford Givens MD wrote: How long do you want the patient to stay on iron?  thanks for your help/input. ------------------------------  Please have pt stay on the iron for 1 more month and then recheck CBC and Fe/TIBC here in the lab.  dx. 280.9  GSD.   Appended Document:  Left message on voicemail  to return call.   Appended Document:  Left message on voicemail  to return call.   Appended Document:  Lab appointment scheduled  Patient Advised.

## 2010-12-31 NOTE — Progress Notes (Signed)
----   Converted from flag ---- ---- 12/23/2010 8:21 AM, Crawford Givens MD wrote: it was changed from lisinopril to cozaar 12/17/08.  no mention of cough.  It wouldn't have been for angioedema w/o being mentioned, listed in allergies, and then changed to ARB.  I think it is reasonable to monitor this clinically.  If he gets a cough, we can change him over. ------------------------------

## 2010-12-31 NOTE — Progress Notes (Signed)
----   Converted from flag ---- ---- 12/22/2010 9:43 PM, Crawford Givens MD wrote: I didn't know of any allergy to lisinopril. His Cr was up this past year with ARF but it is back to a point where it shouldn't be a problem.  Lugene please check his old hard chart for any mention/allergy and then let me know.  thanks to both of you.   ---- 12/22/2010 5:26 PM, Shaune Leeks MD wrote: Clelia Croft, He has been on Lisinopril  before.Marland KitchenMarland KitchenI can't remember why it was stopped. ------------------------------

## 2010-12-31 NOTE — Assessment & Plan Note (Signed)
Summary: 30 min appt. f/u in January/lfw  Left message on home A/M and...   Vital Signs:  Patient profile:   60 year old male Height:      71 inches Weight:      255.25 pounds BMI:     35.73 Temp:     99.5 degrees F oral Pulse rate:   80 / minute Pulse rhythm:   regular BP sitting:   126 / 74  (left arm) Cuff size:   large  Vitals Entered By: Delilah Shan CMA  Dull) (December 19, 2010 3:16 PM) CC: 30 min. appt.   History of Present Illness: Diabetes:  Using medications without difficulties:yes Hypoglycemic episodes: rare, if prolonged fasting Hyperglycemic episodes:no Feet problems:no Blood Sugars averaging: ~150 in afternoon, 80-100 in AM eye exam within last year: appointment is pending  He had GI follow up for low iron and Barrett's, now on PPI and no heartburn.  no tob.  12 pack on the weekend, coor's light.  We discussed this today.  I told him that less would be safer and I couldn't tell him how much was actually safe.  He understood.   Abnormal temp today but no fevers.  Sick contacts who has flu like symptoms.  No symptoms for patient noted recently  Hypertension:      Using medication without problems or lightheadedness: yes Chest pain with exertion:no Edema: minimal  Short of breath:no  Hip pain improved and walking is much better for patient now .  Allergies: 1)  ! Metformin Hcl  Past History:  Past Medical History: Last updated: 11/19/2010 1. Diabetes mellitus, type II: 11/2006 2. Hyperlipidemia:04/1998 3. Hypertension: 1995 4. Benign prostatic hypertrophy:2004 5. Sinus tachycardia: Pt says he has been told his heart rate is high since he was in high school.  Holter monitor (7/10) showed frequent PACs and PVCs.  Average HR 96 (but he had started Toprol XL).  HR range 71-130.  6. OSA: not using CPAP. 7. Obesity. 8. Hip pain 9. Cardiomyopathy.  ? tachycardia-mediated.  Echo (7/10) showed mild LVH with a mildly dilated LV.  EF was difficult to assess  given poor acoustic windows but appeared moderately decreased.  Mild MR.  Probable small perimembranous VSD, mild LAE.  Cardiac MRI was done to followup difficult echo (7/10): This showed mild to moderately dilated LV, mild to moderate LAE, EF 33% with global hypokinesis, normal RV size with mild systolic dysfunction.  There was no large VSD evident.  There was no myocardial delayed enhancement so no evidence for infiltrative disease or infarction.   LHC (7/10) showed only luminal irregularities in the coronaries and EF 35%.  RHC showed mean RA 8, PA 28/15, mean PCWP 10, CI 3.2.  There was no step-up in oxygen saturation on the right side of the heart suggesting no hemodynamically significant VSD.   Repeat echo in 2/11 after medical management of cardiomyopathy showed EF 40-45%, mildly dilated LV, mild LV hypertrophy, anterolateral and apical hypokinesis, mild diastolic dysfunction.  10.  Probable small perimembranous VSD.  11.  Rotator cuff surgery on left (2/11) Diverticulosis  Past Surgical History: Last updated: 12/17/2010 L ING HERNIORRAHPY 1985 LAMINECTOMY W/ DISCECTOMY 1987 R ING HERIORRHAPHY 1999 COLONOSCOPY, DIVERTICS, POLYPS X 3 ,BX NEGATIVE:(08/24/2006), repeat 2012 with mild diverticulosis in the sigmoid colon and 1 polyp in tranverse colon, repeat due 2017 CATH DILATED CARDIOMYOP  MIN VSD  HYPOKIN INF WALL   MULTIP MIN LUMINAL IRR  EF 35%  (05/30/2009) L hip replacement 2011 EGD 2012  with  1) Barrett's esophagus, 2) Erosive esophagitis, 3) Mild gastritis in the antrum, 4) Duodenitis in the bulb of duodenum, 5) Small hiatal hernia  Social History: Last updated: 12/19/2010 Marital Status: divorced. Occupation: [employer], MACHINIST Quit smoking in 2000.  Lives in [place].   + alcohol, usually weekends  Social History: Marital Status: divorced. Occupation: [employer], MACHINIST Quit smoking in 2000.  Lives in [place].   + alcohol, usually  weekends  Review of Systems       See HPI.  Otherwise negative.    Physical Exam  General:  no apparent distress normocephalic atraumatic mucous membranes moist tm wnl, nasal exam w/o erythema, no OP erythema neck supple regular rate and rhythm clear to auscultation bilaterally ext well perfused  Diabetes Management Exam:    Foot Exam (with socks and/or shoes not present):       Sensory-Pinprick/Light touch:          Left medial foot (L-4): normal          Left dorsal foot (L-5): normal          Left lateral foot (S-1): normal          Right medial foot (L-4): normal          Right dorsal foot (L-5): normal          Right lateral foot (S-1): normal       Sensory-Monofilament:          Left foot: normal          Right foot: normal       Inspection:          Left foot: normal          Right foot: normal       Nails:          Left foot: normal          Right foot: normal   Impression & Recommendations:  Problem # 1:  DIABETES MELLITUS, TYPE II (ICD-250.00) Check A1c and Cr.  He isn't on ACE.  This will need to  be addressed depending on his Cr, but with caution given his hx.  See notes on labs.  His updated medication list for this problem includes:    Aspirin 81 Mg Tabs (Aspirin) .Marland Kitchen... Take 2 by mouth daily    Glipizide 5 Mg Xr24h-tab (Glipizide) .Marland Kitchen... 1 by mouth each morning.  Orders: T-CMP with estimated GFR ([address]) T- Hemoglobin A1C ([address]78-29[address])  Problem # 2:  ANEMIA, IRON DEFICIENCY (ICD-280.9) Will notify GI.  I will ask about the expected duration of iron replacement.  His updated medication list for this problem includes:    Ferrous Sulfate 325 (65 Fe) Mg Tabs (Ferrous sulfate) .Marland Kitchen... Take 1 tablet by mouth two times a day  Problem # 3:  HYPERTENSION (ICD-401.9) No change in meds today.  controlled.  His updated medication list for this problem includes:    Carvedilol 25 Mg Tabs (Carvedilol) .Marland Kitchen... Take one tablet by mouth twice a day     Spironolactone 25 Mg Tabs (Spironolactone) .Marland Kitchen... Take 1 tablet by mouth daily  Complete Medication List: 1)  Onetouch Ultra Test Strp (Glucose blood) .... Check daily and if blood sugar fluctuates  250.00 2)  Vitamin D 1000 Unit Tabs (Cholecalciferol) .... One tab by mouth once daily 3)  Paroxetine Hcl 20 Mg Tabs (Paroxetine hcl) .Marland Kitchen.. 1 daily by mouth 4)  Aspirin 81 Mg Tabs (Aspirin) .... Take 2 by mouth  daily 5)  Fish Oil 1000 Mg Caps (Omega-3 fatty acids) .Marland Kitchen.. 1 cap two times a day 6)  Carvedilol 25 Mg Tabs (Carvedilol) .... Take one tablet by mouth twice a day 7)  Spironolactone 25 Mg Tabs (Spironolactone) .... Take 1 tablet by mouth daily 8)  Ferrous Sulfate 325 (65 Fe) Mg Tabs (Ferrous sulfate) .... Take 1 tablet by mouth two times a day 9)  Methocarbamol 500 Mg Tabs (Methocarbamol) .... Take 1 tablet every 6 hours as needed for spasms. 10)  Glipizide 5 Mg Xr24h-tab (Glipizide) .Marland Kitchen.. 1 by mouth each morning. 11)  Pravachol 40 Mg Tabs (Pravastatin sodium) .Marland Kitchen.. 1 by mouth at bedtime 12)  Omeprazole 40 Mg Cpdr (Omeprazole) .Marland Kitchen.. 1 each day 30 minutes before meal  Patient Instructions: 1)  You can get your results through our phone system.  Follow the instructions on the blue card. 2)  6 month follow up, appointment.  fasting cmet/lipids/A1c/MALB before the visit.  250.00. 3)  I'll talk to GI and send word.    Orders Added: 1)  Est. Patient Level IV [14782] 2)  T-CMP with estimated GFR [[address]] 3)  T- Hemoglobin A1C [83036-23375]    Current Allergies (reviewed today): ! METFORMIN HCL

## 2011-01-20 LAB — IRON AND TIBC
Iron: <10 ug/dL — ABNORMAL LOW (ref 42–135)
UIBC: 262 ug/dL

## 2011-01-20 LAB — GLUCOSE, CAPILLARY
Glucose-Capillary: 135 mg/dL — ABNORMAL HIGH (ref 70–99)
Glucose-Capillary: 162 mg/dL — ABNORMAL HIGH (ref 70–99)
Glucose-Capillary: 162 mg/dL — ABNORMAL HIGH (ref 70–99)
Glucose-Capillary: 165 mg/dL — ABNORMAL HIGH (ref 70–99)
Glucose-Capillary: 167 mg/dL — ABNORMAL HIGH (ref 70–99)
Glucose-Capillary: 182 mg/dL — ABNORMAL HIGH (ref 70–99)
Glucose-Capillary: 188 mg/dL — ABNORMAL HIGH (ref 70–99)

## 2011-01-20 LAB — CBC
HCT: 24.5 % — ABNORMAL LOW (ref 39.0–52.0)
HCT: 31.9 % — ABNORMAL LOW (ref 39.0–52.0)
MCH: 32.1 pg (ref 26.0–34.0)
MCH: 32.6 pg (ref 26.0–34.0)
MCHC: 33.9 g/dL (ref 30.0–36.0)
MCHC: 34.3 g/dL (ref 30.0–36.0)
MCHC: 34.7 g/dL (ref 30.0–36.0)
MCV: 94.6 fL (ref 78.0–100.0)
MCV: 95.1 fL (ref 78.0–100.0)
MCV: 95.3 fL (ref 78.0–100.0)
MCV: 96.7 fL (ref 78.0–100.0)
Platelets: 185 10*3/uL (ref 150–400)
Platelets: 197 10*3/uL (ref 150–400)
Platelets: 220 10*3/uL (ref 150–400)
Platelets: 241 10*3/uL (ref 150–400)
Platelets: 251 10*3/uL (ref 150–400)
RBC: 2.75 MIL/uL — ABNORMAL LOW (ref 4.22–5.81)
RBC: 3.3 MIL/uL — ABNORMAL LOW (ref 4.22–5.81)
RBC: 4.24 MIL/uL (ref 4.22–5.81)
RDW: 12.5 % (ref 11.5–15.5)
RDW: 12.6 % (ref 11.5–15.5)
RDW: 12.6 % (ref 11.5–15.5)
RDW: 12.8 % (ref 11.5–15.5)
WBC: 10.4 10*3/uL (ref 4.0–10.5)
WBC: 11 10*3/uL — ABNORMAL HIGH (ref 4.0–10.5)

## 2011-01-20 LAB — TYPE AND SCREEN
ABO/RH(D): O POS
Antibody Screen: NEGATIVE

## 2011-01-20 LAB — COMPREHENSIVE METABOLIC PANEL
Albumin: 4 g/dL (ref 3.5–5.2)
BUN: 11 mg/dL (ref 6–23)
Calcium: 9.8 mg/dL (ref 8.4–10.5)
Chloride: 104 mEq/L (ref 96–112)
Creatinine, Ser: 0.84 mg/dL (ref 0.4–1.5)
GFR calc Af Amer: >60 mL/min (ref >60)
Total Bilirubin: 0.5 mg/dL (ref 0.3–1.2)

## 2011-01-20 LAB — RETICULOCYTES
RBC.: 2.69 MIL/uL — ABNORMAL LOW (ref 4.22–5.81)
Retic Count, Absolute: 43 10*3/uL (ref 19.0–186.0)
Retic Ct Pct: 1.6 % (ref 0.4–3.1)

## 2011-01-20 LAB — RENAL FUNCTION PANEL
BUN: 10 mg/dL (ref 6–23)
CO2: 28 mEq/L (ref 19–32)
Calcium: 8.6 mg/dL (ref 8.4–10.5)
Chloride: 99 mEq/L (ref 96–112)
Creatinine, Ser: 0.72 mg/dL (ref 0.4–1.5)

## 2011-01-20 LAB — BASIC METABOLIC PANEL
BUN: 12 mg/dL (ref 6–23)
BUN: 40 mg/dL — ABNORMAL HIGH (ref 6–23)
CO2: 31 mEq/L (ref 19–32)
Chloride: 100 mEq/L (ref 96–112)
Chloride: 95 mEq/L — ABNORMAL LOW (ref 96–112)
Chloride: 96 mEq/L (ref 96–112)
Creatinine, Ser: 0.7 mg/dL (ref 0.4–1.5)
Creatinine, Ser: 1.27 mg/dL (ref 0.4–1.5)
Creatinine, Ser: 1.8 mg/dL — ABNORMAL HIGH (ref 0.4–1.5)
GFR calc Af Amer: 47 mL/min — ABNORMAL LOW (ref >60)
GFR calc Af Amer: >60 mL/min (ref >60)
GFR calc non Af Amer: 39 mL/min — ABNORMAL LOW (ref >60)
Glucose, Bld: 137 mg/dL — ABNORMAL HIGH (ref 70–99)
Potassium: 4.8 mEq/L (ref 3.5–5.1)
Sodium: 134 mEq/L — ABNORMAL LOW (ref 135–145)

## 2011-01-20 LAB — URINALYSIS, ROUTINE W REFLEX MICROSCOPIC
Ketones, ur: NEGATIVE mg/dL
Nitrite: NEGATIVE
Protein, ur: NEGATIVE mg/dL

## 2011-01-20 LAB — PROTIME-INR
INR: 1.57 — ABNORMAL HIGH (ref 0.00–1.49)
INR: 1.85 — ABNORMAL HIGH (ref 0.00–1.49)
Prothrombin Time: 13.1 seconds (ref 11.6–15.2)
Prothrombin Time: 19 seconds — ABNORMAL HIGH (ref 11.6–15.2)

## 2011-01-20 LAB — URINALYSIS, MICROSCOPIC ONLY
Bilirubin Urine: NEGATIVE
Glucose, UA: 250 mg/dL — AB
Ketones, ur: NEGATIVE mg/dL
Leukocytes, UA: NEGATIVE
Protein, ur: NEGATIVE mg/dL
pH: 5.5 (ref 5.0–8.0)

## 2011-01-20 LAB — APTT: aPTT: 29 seconds (ref 24–37)

## 2011-01-20 LAB — DIFFERENTIAL
Basophils Absolute: 0 10*3/uL (ref 0.0–0.1)
Lymphocytes Relative: 28 % (ref 12–46)
Lymphs Abs: 2.2 10*3/uL (ref 0.7–4.0)
Monocytes Absolute: 0.6 10*3/uL (ref 0.1–1.0)
Neutro Abs: 5.2 10*3/uL (ref 1.7–7.7)

## 2011-01-20 LAB — ABO/RH: ABO/RH(D): O POS

## 2011-01-20 LAB — SURGICAL PCR SCREEN: Staphylococcus aureus: NEGATIVE

## 2011-01-20 LAB — VITAMIN B12: Vitamin B-12: 370 pg/mL (ref 211–911)

## 2011-01-21 ENCOUNTER — Other Ambulatory Visit: Payer: Self-pay | Admitting: Family Medicine

## 2011-01-21 ENCOUNTER — Encounter (INDEPENDENT_AMBULATORY_CARE_PROVIDER_SITE_OTHER): Payer: Self-pay | Admitting: *Deleted

## 2011-01-21 ENCOUNTER — Other Ambulatory Visit (INDEPENDENT_AMBULATORY_CARE_PROVIDER_SITE_OTHER): Payer: 59

## 2011-01-21 DIAGNOSIS — D509 Iron deficiency anemia, unspecified: Secondary | ICD-10-CM

## 2011-01-21 DIAGNOSIS — E119 Type 2 diabetes mellitus without complications: Secondary | ICD-10-CM

## 2011-01-22 LAB — CBC WITH DIFFERENTIAL/PLATELET
Basophils Relative: 0.3 % (ref 0.0–3.0)
Eosinophils Absolute: 0.1 10*3/uL (ref 0.0–0.7)
HCT: 36.2 % — ABNORMAL LOW (ref 39.0–52.0)
Hemoglobin: 12.5 g/dL — ABNORMAL LOW (ref 13.0–17.0)
Lymphocytes Relative: 29.2 % (ref 12.0–46.0)
MCHC: 34.5 g/dL (ref 30.0–36.0)
MCV: 95.3 fl (ref 78.0–100.0)
Neutro Abs: 5 10*3/uL (ref 1.4–7.7)
RBC: 3.8 Mil/uL — ABNORMAL LOW (ref 4.22–5.81)

## 2011-01-22 LAB — IBC PANEL: Saturation Ratios: 19.6 % — ABNORMAL LOW (ref 20.0–50.0)

## 2011-01-22 LAB — BASIC METABOLIC PANEL
CO2: 26 mEq/L (ref 19–32)
Chloride: 106 mEq/L (ref 96–112)
Creatinine, Ser: 1.1 mg/dL (ref 0.4–1.5)

## 2011-01-25 LAB — CBC
HCT: 38.3 % — ABNORMAL LOW (ref 39.0–52.0)
Hemoglobin: 13.6 g/dL (ref 13.0–17.0)
MCHC: 35.4 g/dL (ref 30.0–36.0)
MCV: 97.7 fL (ref 78.0–100.0)
RDW: 12.2 % (ref 11.5–15.5)

## 2011-01-25 LAB — BASIC METABOLIC PANEL
CO2: 30 mEq/L (ref 19–32)
Glucose, Bld: 96 mg/dL (ref 70–99)
Potassium: 4.7 mEq/L (ref 3.5–5.1)
Sodium: 137 mEq/L (ref 135–145)

## 2011-02-11 ENCOUNTER — Other Ambulatory Visit: Payer: Self-pay | Admitting: Cardiology

## 2011-02-11 DIAGNOSIS — I428 Other cardiomyopathies: Secondary | ICD-10-CM

## 2011-02-12 ENCOUNTER — Other Ambulatory Visit (INDEPENDENT_AMBULATORY_CARE_PROVIDER_SITE_OTHER): Payer: 59 | Admitting: *Deleted

## 2011-02-12 ENCOUNTER — Other Ambulatory Visit: Payer: Self-pay | Admitting: *Deleted

## 2011-02-12 DIAGNOSIS — I428 Other cardiomyopathies: Secondary | ICD-10-CM

## 2011-02-15 LAB — POCT I-STAT 3, VENOUS BLOOD GAS (G3P V)
Acid-Base Excess: 1 mmol/L (ref 0.0–2.0)
Acid-Base Excess: 2 mmol/L (ref 0.0–2.0)
Acid-Base Excess: 3 mmol/L — ABNORMAL HIGH (ref 0.0–2.0)
Bicarbonate: 23.5 mEq/L (ref 20.0–24.0)
Bicarbonate: 27.1 mEq/L — ABNORMAL HIGH (ref 20.0–24.0)
O2 Saturation: 64 %
O2 Saturation: 65 %
O2 Saturation: 69 %
TCO2: 24 mmol/L (ref 0–100)
TCO2: 28 mmol/L (ref 0–100)
TCO2: 29 mmol/L (ref 0–100)
pCO2, Ven: 44.3 mmHg — ABNORMAL LOW (ref 45.0–50.0)
pH, Ven: 7.392 — ABNORMAL HIGH (ref 7.250–7.300)
pH, Ven: 7.394 — ABNORMAL HIGH (ref 7.250–7.300)
pH, Ven: 7.404 — ABNORMAL HIGH (ref 7.250–7.300)
pO2, Ven: 34 mmHg (ref 30.0–45.0)
pO2, Ven: 34 mmHg (ref 30.0–45.0)
pO2, Ven: 37 mmHg (ref 30.0–45.0)
pO2, Ven: 51 mmHg — ABNORMAL HIGH (ref 30.0–45.0)

## 2011-02-15 LAB — POCT I-STAT 3, ART BLOOD GAS (G3+)
Bicarbonate: 26.4 mEq/L — ABNORMAL HIGH (ref 20.0–24.0)
TCO2: 28 mmol/L (ref 0–100)

## 2011-02-15 LAB — POCT I-STAT GLUCOSE
Glucose, Bld: 94 mg/dL (ref 70–99)
Operator id: 194801

## 2011-02-23 ENCOUNTER — Other Ambulatory Visit: Payer: Self-pay | Admitting: Family Medicine

## 2011-03-16 ENCOUNTER — Encounter: Payer: Self-pay | Admitting: Family Medicine

## 2011-03-16 ENCOUNTER — Ambulatory Visit (INDEPENDENT_AMBULATORY_CARE_PROVIDER_SITE_OTHER): Payer: 59 | Admitting: Family Medicine

## 2011-03-16 VITALS — BP 108/74 | HR 84 | Temp 98.8°F | Wt 246.1 lb

## 2011-03-16 DIAGNOSIS — R300 Dysuria: Secondary | ICD-10-CM

## 2011-03-16 DIAGNOSIS — N400 Enlarged prostate without lower urinary tract symptoms: Secondary | ICD-10-CM

## 2011-03-16 LAB — POCT URINALYSIS DIPSTICK
Ketones, UA: NEGATIVE
Urobilinogen, UA: NEGATIVE
pH, UA: 6

## 2011-03-16 MED ORDER — DOXAZOSIN MESYLATE 1 MG PO TABS: 1.0000 mg | ORAL_TABLET | Freq: Every day | ORAL | Status: DC

## 2011-03-16 NOTE — Progress Notes (Signed)
"  Trouble urinating"  Last week with change noted.  Frequency.  Urgency.  Occ incont.  Some dribbling after stopping with some pain that that point in the stream.  No FCNAVD.  H/o BPH.  No new meds in last week. Off doxazosin since 11/11.  SBP has been as high as ~140  Meds, vitals, and allergies reviewed.   ROS: See HPI.  Otherwise, noncontributory.  nad ncat rrr ctab Prostate: Enlarged by not ttp Abd: not ttp in suprapubic area

## 2011-03-16 NOTE — Assessment & Plan Note (Addendum)
Dysuria likely due to BPH.  Out of work due to frequency, which can be as often as q25min.  Prev relief with doxazosin.  Restart doxazosin and BP caution given.  I think he'll tolerate 1mg .  If any orthostatic sx, he'll notify the clinic and we can adjust as needed.  Check ucx.  No sign of prostatis.  D/w pt and he understood.

## 2011-03-16 NOTE — Patient Instructions (Addendum)
Start the doxazosin 1mg  a day and let me know if your pressure is low (or if you don't have improvement).  We'll contact you with your lab report.  Take care.

## 2011-03-18 ENCOUNTER — Telehealth: Payer: Self-pay | Admitting: Family Medicine

## 2011-03-18 LAB — URINE CULTURE: Colony Count: >=100000

## 2011-03-18 MED ORDER — CIPROFLOXACIN HCL 500 MG PO TABS: 500.0000 mg | ORAL_TABLET | Freq: Two times a day (BID) | ORAL | Status: AC

## 2011-03-18 NOTE — Telephone Encounter (Signed)
Patient advised.

## 2011-03-18 NOTE — Telephone Encounter (Signed)
E coli on ucx, sens to cipro.  Please call pt and notify him.  I sent in the rx.  If he continues to have symptoms, then notify us.  Thanks.  I would continue the doxazosin in the meantime.

## 2011-03-20 ENCOUNTER — Other Ambulatory Visit: Payer: Self-pay | Admitting: Cardiology

## 2011-03-24 NOTE — Cardiovascular Report (Signed)
NAMEGEORDAN, XU               ACCOUNT NO.:  [id number]   MEDICAL RECORD NO.:  [id number]          PATIENT TYPE:  OIB   LOCATION:  1966                         FACILITY:  [facility]   PHYSICIAN:  Marca Ancona, MD      DATE OF BIRTH:  06-Jun-1951   DATE OF PROCEDURE:  05/30/2009  DATE OF DISCHARGE:  05/30/2009                            CARDIAC CATHETERIZATION   PROCEDURES:  1. Left heart catheterization.  2. Right heart catheterization.  3. Coronary angiography  4. Left ventriculography.   INDICATIONS:  This is a 60 year old with mildly dilated left ventricle  EF noted to be 33% by MRI.  No prior history of coronary disease.  He  did have what appear to be a small perimembranous VSD on echocardiogram.  The study is done to assess for coronary disease as a cause of his  cardiomyopathy, assess his filling pressures, and also to measure oxygen  saturations of the right side of the heart, to assess for any step-up.   PROCEDURE NOTE:  After informed consent was obtained, the right groin  sterilely prepped and draped.  1% lidocaine was used locally to  anesthetize the right groin area.  The right common femoral artery was  entered using Seldinger technique and a 4-French arterial sheath was  placed.  The right common femoral vein was accessed using Seldinger  technique and a 7-French venous sheath was placed.  The right heart  catheterization was carried out using a balloon-tip Swan-Ganz catheter.  Samples were removed from the various heart chambers for oxygen  saturation measurement.  The left coronary artery was engaged using the  4-French JL-3.5 catheter.  The right coronary artery was engaged using a  4-French 3DRC catheter.  Left ventricle was entered using the 4-French  angled pigtail catheter.  There were no complications.   FINDINGS:  1. Hemodynamics:  Mean right atrial pressure 8 mmHg, RV 29/9, PA      28/15, mean PA pressure 20 mmHg, mean pulmonary capillary wedge  pressure 10 mmHg, LV 126/16, aorta 125/72.  Cardiac output 7 L/min.      Cardiac index 3.2.  2. Saturations:  Aortic saturation 90%.  Pulmonary venous saturation      89%.  Pulmonary arterial saturation 69%.  Right ventricular      saturation 65%.  Mid right atrial saturation 64%.  SVC saturation      66%.  There was not a significant step-up of oxygenation on the      right side of the heart suggesting that the VSD is not      hemodynamically significant.  3. Left ventriculography:  The patient has a mildly dilated left      ventricle.  EF is estimated to be about 35%.  There is hypokinesis      that appears worse in the inferior wall.  4. Left main:  There is no angiographic coronary disease in the left      main coronary artery.  5. Left circumflex system:  There is a small first obtuse marginal and      a large PLOM.  There are  minimal luminal irregularities in the      circumflex system.  6. LAD system:  There are mild luminal irregularities in the LAD.      There is a moderate-sized first diagonal and several small      diagonals following that.  7. Right coronary artery:  The right coronary artery is dominant.      There are minimal luminal irregularities.   ASSESSMENT/PLAN:  This is a 60 year old with a history of dilated  cardiomyopathy, ejection fraction calculated 33% by MRI also with a  probable perimembranous ventricular septal defect noted on  echocardiogram.  This study shows no significant coronary artery  disease.  The patient has minimal luminal irregularities.  His EF is  estimated to be about 35% with a dilated cardiomyopathy.  His filling  pressures for both right and left heart do appear to be close to normal.  He is not significantly volume overloaded.  He has excellent cardiac  index.  On shunt run, he does not have a significant step-up in oxygen  saturation on the right side of his heart suggesting that he does not  have a hemodynamically significant  ventricular septal defect.  We will  continue medical management for dilated cardiomyopathy.  Today, we will  add spironolactone 12.5 mg once a day.      Marca Ancona, MD  Electronically Signed     DM/MEDQ  D:  05/30/2009  T:  05/31/2009  Job:  [id number]   cc:   Arta Silence, MD

## 2011-03-31 ENCOUNTER — Other Ambulatory Visit: Payer: Self-pay | Admitting: Cardiology

## 2011-04-17 ENCOUNTER — Other Ambulatory Visit: Payer: Self-pay | Admitting: Cardiology

## 2011-05-03 ENCOUNTER — Other Ambulatory Visit: Payer: Self-pay | Admitting: Cardiology

## 2011-05-19 ENCOUNTER — Encounter: Payer: Self-pay | Admitting: Cardiology

## 2011-05-20 ENCOUNTER — Ambulatory Visit: Payer: 59 | Admitting: Cardiology

## 2011-06-17 ENCOUNTER — Telehealth: Payer: Self-pay | Admitting: Family Medicine

## 2011-06-17 ENCOUNTER — Other Ambulatory Visit (INDEPENDENT_AMBULATORY_CARE_PROVIDER_SITE_OTHER): Payer: 59 | Admitting: Family Medicine

## 2011-06-17 DIAGNOSIS — E119 Type 2 diabetes mellitus without complications: Secondary | ICD-10-CM

## 2011-06-17 DIAGNOSIS — E875 Hyperkalemia: Secondary | ICD-10-CM

## 2011-06-17 DIAGNOSIS — E785 Hyperlipidemia, unspecified: Secondary | ICD-10-CM

## 2011-06-17 LAB — LIPID PANEL
HDL: 56 mg/dL (ref >39.00)
Triglycerides: 155 mg/dL — ABNORMAL HIGH (ref 0.0–149.0)

## 2011-06-17 LAB — COMPREHENSIVE METABOLIC PANEL
Alkaline Phosphatase: 72 U/L (ref 39–117)
BUN: 24 mg/dL — ABNORMAL HIGH (ref 6–23)
Glucose, Bld: 146 mg/dL — ABNORMAL HIGH (ref 70–99)
Total Bilirubin: 0.9 mg/dL (ref 0.3–1.2)

## 2011-06-17 LAB — MICROALBUMIN / CREATININE URINE RATIO
Creatinine,U: 113 mg/dL
Microalb Creat Ratio: 1 mg/g (ref 0.0–30.0)
Microalb, Ur: 1.1 mg/dL (ref 0.0–1.9)

## 2011-06-17 LAB — HEMOGLOBIN A1C: Hgb A1c MFr Bld: 6.4 % (ref 4.6–6.5)

## 2011-06-17 LAB — LDL CHOLESTEROL, DIRECT: Direct LDL: 138.8 mg/dL

## 2011-06-17 NOTE — Telephone Encounter (Signed)
Message copied by Lars Mage on Wed Jun 17, 2011  5:19 PM ------      Message from: Arta Silence      Created: Wed Jun 17, 2011  5:01 PM                   ----- Message -----         From: Lab In [place] Interface         Sent: 06/17/2011   3:21 PM           To: Laurita Quint, MD

## 2011-06-17 NOTE — Telephone Encounter (Signed)
Called patient and left a message on cell phone voicemail and machine at home for him to return call.

## 2011-06-17 NOTE — Telephone Encounter (Signed)
Please call pt.  I'll talk to him about his labs at the OV.  In the meantime, hold the spironolactone.  His K was mildly elevated.  Avoid high potassium foods in the meantime.  We can recheck K at the OV.  Thanks.

## 2011-06-18 NOTE — Telephone Encounter (Signed)
Patient advised as instructed via telephone. 

## 2011-06-18 NOTE — Telephone Encounter (Signed)
Left message on machine at home and cell phone voicemail for patient to return call.

## 2011-06-22 ENCOUNTER — Ambulatory Visit (INDEPENDENT_AMBULATORY_CARE_PROVIDER_SITE_OTHER): Payer: 59 | Admitting: Family Medicine

## 2011-06-22 ENCOUNTER — Encounter: Payer: Self-pay | Admitting: Family Medicine

## 2011-06-22 DIAGNOSIS — D649 Anemia, unspecified: Secondary | ICD-10-CM

## 2011-06-22 DIAGNOSIS — E875 Hyperkalemia: Secondary | ICD-10-CM

## 2011-06-22 DIAGNOSIS — E119 Type 2 diabetes mellitus without complications: Secondary | ICD-10-CM

## 2011-06-22 DIAGNOSIS — D509 Iron deficiency anemia, unspecified: Secondary | ICD-10-CM

## 2011-06-22 DIAGNOSIS — I10 Essential (primary) hypertension: Secondary | ICD-10-CM

## 2011-06-22 NOTE — Progress Notes (Signed)
Hypertension:    Using medication without problems or lightheadedness: yes Chest pain with exertion:no Edema:no Short of breath:no Average home BPs: 110's/60-80s Other issues:Hyper K, spironolactone held.   Diabetes:  Using medications without difficulties:yes Hypoglycemic episodes:no Hyperglycemic episodes:no Feet problems:no Blood Sugars averaging: occ 100-150, occ 200 after eating eye exam within last year: due, pt to call about this  H/o low iron, due for repeat labs.    PMH and SH reviewed  ROS: See HPI, otherwise noncontributory.  Meds, vitals, and allergies reviewed.   GEN: nad, alert and oriented HEENT: mucous membranes moist NECK: supple w/o LA CV: rrr PULM: ctab, no inc wob ABD: soft, +bs EXT: no edema SKIN: no acute rash  Diabetic foot exam: Normal inspection No skin breakdown No calluses  Normal DP pulses Normal sensation to light touch and monofilament Nails normal

## 2011-06-22 NOTE — Assessment & Plan Note (Signed)
Recheck labs, stop iron if back to wnl.

## 2011-06-22 NOTE — Assessment & Plan Note (Signed)
Cont current meds, work on diet and weight.  He agrees.

## 2011-06-22 NOTE — Patient Instructions (Signed)
Glad to see you.   Recheck A1c in 6 months before OV.  Try to keep exercising and work on your diet. Stay off the spironolactone.  You can get your results through our phone system.  Follow the instructions on the blue card. I would get a flu shot each fall.   Check on the tetanus shot at work. Check with your insurance to see if they will cover the shingles shot at age 60.

## 2011-06-22 NOTE — Assessment & Plan Note (Signed)
Cont current meds.  Recheck K, stopped spironolactone in meantime.  D/w pt about recent labs.  Work on [employer] via diet/exercise.

## 2011-06-23 LAB — IBC PANEL
Iron: 66 ug/dL (ref 42–165)
Saturation Ratios: 17.1 % — ABNORMAL LOW (ref 20.0–50.0)
Transferrin: 276.1 mg/dL (ref 212.0–360.0)

## 2011-06-23 LAB — HEMOGLOBIN: Hemoglobin: 12.8 g/dL — ABNORMAL LOW (ref 13.0–17.0)

## 2011-06-23 LAB — POTASSIUM: Potassium: 4.6 mEq/L (ref 3.5–5.1)

## 2011-06-24 ENCOUNTER — Telehealth: Payer: Self-pay | Admitting: Family Medicine

## 2011-06-24 NOTE — Telephone Encounter (Signed)
LM on patient's cell phone VM.

## 2011-06-24 NOTE — Telephone Encounter (Signed)
Please call pt.  Okay to stop iron.  Blood counts are fine.  Potassium is back to normal. I think it was correct to stop the spironolactone and not start it back.  Thanks.

## 2011-07-10 ENCOUNTER — Telehealth: Payer: Self-pay | Admitting: *Deleted

## 2011-07-10 NOTE — Telephone Encounter (Signed)
The patient has assured me that I could leave a message on his VM of his cell phone and that he would get it faster that way because of his work schedule.  I left that detailed message on 06/24/2011.  Patient was advised of this.  I will, in the future, continue to try the pt until I can speak with him personally.

## 2011-07-10 NOTE — Telephone Encounter (Signed)
See phone notes from 8/15.  These were reviewed.

## 2011-07-10 NOTE — Telephone Encounter (Signed)
Patient called requesting the results of most recent lab test.  I see the lab results but no indication that they have been reviewed.  Please advise.

## 2011-08-07 ENCOUNTER — Ambulatory Visit (INDEPENDENT_AMBULATORY_CARE_PROVIDER_SITE_OTHER): Payer: 59 | Admitting: Family Medicine

## 2011-08-07 ENCOUNTER — Encounter: Payer: Self-pay | Admitting: Family Medicine

## 2011-08-07 VITALS — BP 104/64 | HR 100 | Temp 98.4°F | Ht 71.0 in | Wt 256.0 lb

## 2011-08-07 DIAGNOSIS — Z23 Encounter for immunization: Secondary | ICD-10-CM

## 2011-08-07 DIAGNOSIS — T23202A Burn of second degree of left hand, unspecified site, initial encounter: Secondary | ICD-10-CM | POA: Insufficient documentation

## 2011-08-07 DIAGNOSIS — T23209A Burn of second degree of unspecified hand, unspecified site, initial encounter: Secondary | ICD-10-CM

## 2011-08-07 MED ORDER — SULFAMETHOXAZOLE-TRIMETHOPRIM 800-160 MG PO TABS: 1.0000 | ORAL_TABLET | Freq: Two times a day (BID) | ORAL | Status: AC

## 2011-08-07 MED ORDER — SILVER SULFADIAZINE 1 % EX CREA: TOPICAL_CREAM | Freq: Every day | CUTANEOUS | Status: DC
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
## 2011-08-07 NOTE — Patient Instructions (Signed)
tetnus shot (Tdap) today Take the bactrim DS for 7 days as directed  Use silvadene cream once daily to dress hand and wrist with non stick dressing and loose wrap Wash with antibacterial soap and water 1-2 times daily  If worse/ pain/ redness/ swelling/ pus or fever- call Follow up with Dr Hetty Ely or myself in about a week to re check wound

## 2011-08-09 NOTE — Assessment & Plan Note (Signed)
Thankfully with no s/s/ of infection Given exposure to oil and dirty machinery today- will cover empirically with bactrim  Cleaned and dressed with silvadene today - with tefla and kerlex wrap Pt tolerated well  inst given on care- see inst  Out of work this weekend Will need to be covered until healed  Re check 1 week  Tdap today

## 2011-08-14 ENCOUNTER — Ambulatory Visit: Payer: 59 | Admitting: Family Medicine

## 2011-08-14 DIAGNOSIS — Z0289 Encounter for other administrative examinations: Secondary | ICD-10-CM

## 2011-09-09 ENCOUNTER — Other Ambulatory Visit: Payer: Self-pay | Admitting: Family Medicine

## 2011-10-12 ENCOUNTER — Other Ambulatory Visit: Payer: Self-pay | Admitting: Family Medicine

## 2011-10-12 ENCOUNTER — Other Ambulatory Visit: Payer: Self-pay | Admitting: *Deleted

## 2011-10-12 MED ORDER — AMLODIPINE BESYLATE 5 MG PO TABS: 5.0000 mg | ORAL_TABLET | Freq: Every day | ORAL | Status: DC

## 2011-10-12 NOTE — Telephone Encounter (Signed)
Is it okay to refill medication? 

## 2011-10-14 ENCOUNTER — Encounter (HOSPITAL_BASED_OUTPATIENT_CLINIC_OR_DEPARTMENT_OTHER): Payer: Self-pay | Admitting: *Deleted

## 2011-10-14 ENCOUNTER — Other Ambulatory Visit: Payer: Self-pay

## 2011-10-14 ENCOUNTER — Encounter (HOSPITAL_BASED_OUTPATIENT_CLINIC_OR_DEPARTMENT_OTHER)
Admission: RE | Admit: 2011-10-14 | Discharge: 2011-10-14 | Disposition: A | Discharge status: Home / Self Care | Payer: 59 | Source: Ambulatory Visit | Attending: Orthopedic Surgery | Admitting: Orthopedic Surgery

## 2011-10-14 LAB — BASIC METABOLIC PANEL
BUN: 22 mg/dL (ref 6–23)
Creatinine, Ser: 0.85 mg/dL (ref 0.50–1.35)
GFR calc Af Amer: >90 mL/min (ref >90)
GFR calc non Af Amer: >90 mL/min (ref >90)

## 2011-10-14 NOTE — H&P (Signed)
[facility] [address]   [address] [place], [place] [address] [phone] A Division of [facility]  Loreta Ave, M.D.     Robert A. Thurston Hole, M.D.     Lunette Stands, M.D. Eulas Post, M.D.    Buford Dresser, M.D. Estell Harpin, M.D. Ralene Cork, D.O.          Genene Churn. Barry Dienes, PA-C            Kirstin A. Shepperson, PA-C [place], OPA-C   RE: Tarique, Loveall                                [phone]      DOB: [birth year] PROGRESS NOTE: 07-21-11 Chief complaint: Right knee pain.  History of present illness: 60 year-old white male who presents to the office today with the above complaint.  Patient has a known history of right knee DJD.  MRI scan on February 04, 2004 showed a focal Grade III chondromalacia in the lateral compartment, extensive mucoid degeneration of the ACL and some blunting of the lateral meniscus, but no discreet tear.  At the time of the scan he was not having any mechanical symptoms and we initially treated him conservatively with Synvisc series in 2005 and 2007.  He has had great relief with both.  He states that his right knee was doing well up until about a month ago when he began having some symptoms, mostly in the medial compartment.  Again, no mechanical symptoms or feeling of instability.  No injury.  Denies lumbar spine issues.  He is status post left total hip replacement on September 17, 2010 and he did not keep his 6 month post-op appointment since he was doing well.  He continues to be very pleased with his surgical result up to this point.  Right knee pain aggravated with ambulation and squatting.  He has had intermittent swelling.   Current medications: Norvasc, Coreg, Vitamin D, Cardura, ferrous sulfate, fish oil, Glucotrol, Lisinopril, Prilosec, Paxil and Pravachol. Allergies: No known drug allergies. Past medical/surgical history: Back surgery in 1989, shoulder surgery in 2010,  left total hip replacement in 2011, hernia repair, hypertension, diabetes and depression.  Review of systems: All other systems are unremarkable.  Family history: Positive for diabetes, hypertension and arthritis. Social history: Does not smoke, admits occasional alcohol use.  Patient is single and employed with [employer].    EXAMINATION: Height: 5?11.  Weight: 255 pounds.  Pleasant white male, alert and oriented x 3 and in no acute distress.  Gait is antalgic.  Good painless range of motion bilateral hips.  Right knee range of motion about 0-120 degrees.  1+ effusion.  Joint line tender.  Ligaments stable.  Calf non-tender.  Neurovascularly intact.  Skin warm and dry.  Not much patellofemoral crepitus.  No increase in respiratory effort.    X-RAYS: Right knee, AP, lateral and sunrise views, show some tricompartmental degenerative changes.  He does have some loose bodies posterior knee best seen on the lateral view.  Left hip, AP and lateral view, shows excellent alignment and seating of his prosthesis.    IMPRESSION: Right knee pain secondary to DJD and possible degenerative meniscus tear.  Loose bodies.  Continued    Munachimso Rigdon/[facility] [address]   [address] [place],  [address] [phone] A Division of [facility]  Loreta Ave, M.D.     Robert A. Thurston Hole, M.D.     Lunette Stands, M.D. Eulas Post, M.D.    Buford Dresser, M.D. Estell Harpin, M.D. Ralene Cork, D.O.          Genene Churn. Barry Dienes, PA-C            Kirstin A. Shepperson, PA-C [place], OPA-C   RE: Jamey, Harman                                [phone]      DOB: 09-May-1951 PROGRESS NOTE: 07-21-11 PLAN:  Since patient is not describing any mechanical symptoms we will attempt conservative treatment with injection.  In about 2-3 weeks he will let us know how he is feeling.  If he has not had good relief we will schedule an MRI  scan.  If he has excellent response we will follow up in one year for repeat x-ray of his left total hip replacement.  All questions answered.    PROCEDURE NOTE: The patient's clinical condition is marked by substantial pain and/or significant functional disability.  Other conservative therapy has not provided relief, is contraindicated, or not appropriate.  There is a reasonable likelihood that injection will significantly improve the patient's pain and/or functional disability. After patient consent the right knee was prepped with Betadine after using 1 cc of 1% Xylocaine for local anesthetic, intraarticular 2:6 Depo-Medrol/Marcaine injection performed.  Tolerated procedure well without complication.   Loreta Ave, M.D.   Electronically verified by Loreta Ave, M.D. DFM(JMO):jjh D 07-22-11 T 07-23-11  Susana Gripp/[facility] [address]   [address] [place], [place] [address] [phone] A Division of [facility]  Loreta Ave, M.D.     Robert A. Thurston Hole, M.D.     Lunette Stands, M.D. Eulas Post, M.D.    Buford Dresser, M.D. Estell Harpin, M.D. Ralene Cork, D.O.          Genene Churn. Barry Dienes, PA-C            Kirstin A. Shepperson, PA-C [place], OPA-C   RE: Roberta, Kelly   [phone]      DOB: 01-24-1951 PROGRESS NOTE: 09-22-11 60 year old white male with history of right knee pain returns to review MRI performed 09/18/11. Scan showed some signal changes medial and lateral meniscus and lateral compartment chondromalacia. Pain and mechanical symptoms unchanged from previous visit. His right knee x-ray lateral view showed loose bodies posteriorly AP view showed this to be posteromedial.   EXAMINATION: Right knee unchanged from previous visit. Gait is somewhat antalgic.  DISPOSITION: He's advised with ongoing symptoms and findings on MRI and x-rays best option would be arthroscopy debridement and removal of  loose bodies. Discussed risks benefits and possible complications and paperwork filled out. He'll be out of work from now to 6-12 weeks post-op. All questions answered.  Loreta Ave, M.D.  Electronically verified by Loreta Ave, M.D. DFM(JMO):kh D 09-22-11 T 09-23-11

## 2011-10-14 NOTE — Progress Notes (Signed)
To come in for ekg and bmet Works full time, denies any sob

## 2011-10-15 ENCOUNTER — Encounter (HOSPITAL_BASED_OUTPATIENT_CLINIC_OR_DEPARTMENT_OTHER): Payer: Self-pay | Admitting: Orthopedic Surgery

## 2011-10-15 ENCOUNTER — Encounter (HOSPITAL_BASED_OUTPATIENT_CLINIC_OR_DEPARTMENT_OTHER): Payer: Self-pay | Admitting: Anesthesiology

## 2011-10-15 ENCOUNTER — Ambulatory Visit (HOSPITAL_BASED_OUTPATIENT_CLINIC_OR_DEPARTMENT_OTHER): Payer: 59 | Admitting: Anesthesiology

## 2011-10-15 ENCOUNTER — Encounter (HOSPITAL_BASED_OUTPATIENT_CLINIC_OR_DEPARTMENT_OTHER)
Admission: RE | Disposition: A | Discharge status: Home / Self Care | Payer: Self-pay | Source: Ambulatory Visit | Attending: Orthopedic Surgery

## 2011-10-15 ENCOUNTER — Encounter (HOSPITAL_BASED_OUTPATIENT_CLINIC_OR_DEPARTMENT_OTHER): Payer: Self-pay | Admitting: *Deleted

## 2011-10-15 ENCOUNTER — Ambulatory Visit (HOSPITAL_BASED_OUTPATIENT_CLINIC_OR_DEPARTMENT_OTHER)
Admission: RE | Admit: 2011-10-15 | Discharge: 2011-10-15 | Disposition: A | Discharge status: Home / Self Care | Payer: 59 | Source: Ambulatory Visit | Attending: Orthopedic Surgery | Admitting: Orthopedic Surgery

## 2011-10-15 DIAGNOSIS — M2340 Loose body in knee, unspecified knee: Secondary | ICD-10-CM | POA: Insufficient documentation

## 2011-10-15 DIAGNOSIS — E119 Type 2 diabetes mellitus without complications: Secondary | ICD-10-CM | POA: Insufficient documentation

## 2011-10-15 DIAGNOSIS — M6750 Plica syndrome, unspecified knee: Secondary | ICD-10-CM | POA: Insufficient documentation

## 2011-10-15 DIAGNOSIS — G4730 Sleep apnea, unspecified: Secondary | ICD-10-CM | POA: Insufficient documentation

## 2011-10-15 DIAGNOSIS — M2240 Chondromalacia patellae, unspecified knee: Secondary | ICD-10-CM | POA: Insufficient documentation

## 2011-10-15 DIAGNOSIS — Z4789 Encounter for other orthopedic aftercare: Secondary | ICD-10-CM

## 2011-10-15 DIAGNOSIS — Z01812 Encounter for preprocedural laboratory examination: Secondary | ICD-10-CM | POA: Insufficient documentation

## 2011-10-15 DIAGNOSIS — I10 Essential (primary) hypertension: Secondary | ICD-10-CM | POA: Insufficient documentation

## 2011-10-15 DIAGNOSIS — Z01810 Encounter for preprocedural cardiovascular examination: Secondary | ICD-10-CM | POA: Insufficient documentation

## 2011-10-15 DIAGNOSIS — M899 Disorder of bone, unspecified: Secondary | ICD-10-CM | POA: Insufficient documentation

## 2011-10-15 DIAGNOSIS — M23302 Other meniscus derangements, unspecified lateral meniscus, unspecified knee: Secondary | ICD-10-CM | POA: Insufficient documentation

## 2011-10-15 HISTORY — PX: KNEE ARTHROSCOPY: SHX127

## 2011-10-15 LAB — GLUCOSE, CAPILLARY: Glucose-Capillary: 159 mg/dL — ABNORMAL HIGH (ref 70–99)

## 2011-10-15 SURGERY — ARTHROSCOPY, KNEE
Anesthesia: General | Site: Knee | Laterality: Right | Wound class: Clean

## 2011-10-15 MED ORDER — FENTANYL CITRATE 0.05 MG/ML IJ SOLN
25.0000 ug | INTRAMUSCULAR | Status: DC | PRN
  Administered 2011-10-15: 50 ug via INTRAVENOUS
  Administered 2011-10-15: 25 ug via INTRAVENOUS

## 2011-10-15 MED ORDER — OXYCODONE-ACETAMINOPHEN 5-325 MG PO TABS
1.0000 | ORAL_TABLET | Freq: Once | ORAL | Status: AC | PRN
  Administered 2011-10-15: 2 via ORAL

## 2011-10-15 MED ORDER — METHYLPREDNISOLONE ACETATE 80 MG/ML IJ SUSP
INTRAMUSCULAR | Status: DC | PRN
  Administered 2011-10-15: 1 mL

## 2011-10-15 MED ORDER — ONDANSETRON HCL 4 MG/2ML IJ SOLN
INTRAMUSCULAR | Status: DC | PRN
  Administered 2011-10-15: 4 mg via INTRAVENOUS

## 2011-10-15 MED ORDER — PROMETHAZINE HCL 25 MG/ML IJ SOLN: 6.2500 mg | INTRAMUSCULAR | Status: DC | PRN

## 2011-10-15 MED ORDER — SODIUM CHLORIDE 0.9 % IR SOLN
Status: DC | PRN
  Administered 2011-10-15: 3000 mL

## 2011-10-15 MED ORDER — LACTATED RINGERS IV SOLN
INTRAVENOUS | Status: DC
  Administered 2011-10-15 (×2): via INTRAVENOUS

## 2011-10-15 MED ORDER — LIDOCAINE HCL (CARDIAC) 20 MG/ML IV SOLN
INTRAVENOUS | Status: DC | PRN
  Administered 2011-10-15: 50 mg via INTRAVENOUS

## 2011-10-15 MED ORDER — MIDAZOLAM HCL 5 MG/5ML IJ SOLN
INTRAMUSCULAR | Status: DC | PRN
  Administered 2011-10-15: 1 mg via INTRAVENOUS

## 2011-10-15 MED ORDER — EPHEDRINE SULFATE 50 MG/ML IJ SOLN
INTRAMUSCULAR | Status: DC | PRN
  Administered 2011-10-15: 10 mg via INTRAVENOUS
  Administered 2011-10-15: 5 mg via INTRAVENOUS
  Administered 2011-10-15 (×2): 10 mg via INTRAVENOUS

## 2011-10-15 MED ORDER — BUPIVACAINE HCL (PF) 0.25 % IJ SOLN
INTRAMUSCULAR | Status: DC | PRN
  Administered 2011-10-15: 20 mL

## 2011-10-15 MED ORDER — CHLORHEXIDINE GLUCONATE 4 % EX LIQD: 60.0000 mL | Freq: Once | CUTANEOUS | Status: DC

## 2011-10-15 MED ORDER — FENTANYL CITRATE 0.05 MG/ML IJ SOLN
INTRAMUSCULAR | Status: DC | PRN
  Administered 2011-10-15: 25 ug via INTRAVENOUS
  Administered 2011-10-15: 50 ug via INTRAVENOUS
  Administered 2011-10-15: 25 ug via INTRAVENOUS

## 2011-10-15 MED ORDER — PROPOFOL 10 MG/ML IV EMUL
INTRAVENOUS | Status: DC | PRN
  Administered 2011-10-15: 150 mg via INTRAVENOUS

## 2011-10-15 MED ORDER — MEPERIDINE HCL 25 MG/ML IJ SOLN: 6.2500 mg | INTRAMUSCULAR | Status: DC | PRN

## 2011-10-15 MED ORDER — CEFAZOLIN SODIUM-DEXTROSE 2-3 GM-% IV SOLR
2.0000 g | INTRAVENOUS | Status: AC
  Administered 2011-10-15: 2 g via INTRAVENOUS

## 2011-10-15 SURGICAL SUPPLY — 41 items
BANDAGE ELASTIC 6 VELCRO ST LF (GAUZE/BANDAGES/DRESSINGS) ×2 IMPLANT
BLADE CUDA 5.5 (BLADE) IMPLANT
BLADE CUDA GRT WHITE 3.5 (BLADE) IMPLANT
BLADE CUTTER GATOR 3.5 (BLADE) ×2 IMPLANT
BLADE CUTTER MENIS 5.5 (BLADE) IMPLANT
BLADE GREAT WHITE 4.2 (BLADE) ×2 IMPLANT
BNDG COHESIVE 4X5 TAN STRL (GAUZE/BANDAGES/DRESSINGS) ×1 IMPLANT
BUR OVAL 4.0 (BURR) IMPLANT
CANISTER OMNI JUG 16 LITER (MISCELLANEOUS) ×2 IMPLANT
CANISTER SUCTION 2500CC (MISCELLANEOUS) IMPLANT
CLOTH BEACON ORANGE TIMEOUT ST (SAFETY) ×2 IMPLANT
CUTTER MENISCUS  4.2MM (BLADE)
CUTTER MENISCUS 4.2MM (BLADE) IMPLANT
DRAPE ARTHROSCOPY W/POUCH 90 (DRAPES) ×2 IMPLANT
DURAPREP 26ML APPLICATOR (WOUND CARE) ×2 IMPLANT
ELECT MENISCUS 165MM 90D (ELECTRODE) ×1 IMPLANT
ELECT REM PT RETURN 9FT ADLT (ELECTROSURGICAL) ×2
ELECTRODE REM PT RTRN 9FT ADLT (ELECTROSURGICAL) IMPLANT
GAUZE XEROFORM 1X8 LF (GAUZE/BANDAGES/DRESSINGS) ×2 IMPLANT
GLOVE BIO SURGEON STRL SZ 6.5 (GLOVE) ×2 IMPLANT
GLOVE BIOGEL M STRL SZ7.5 (GLOVE) ×1 IMPLANT
GLOVE BIOGEL PI IND STRL 7.0 (GLOVE) IMPLANT
GLOVE BIOGEL PI IND STRL 8 (GLOVE) ×1 IMPLANT
GLOVE BIOGEL PI INDICATOR 7.0 (GLOVE) ×1
GLOVE BIOGEL PI INDICATOR 8 (GLOVE) ×1
GLOVE ORTHO TXT STRL SZ7.5 (GLOVE) ×4 IMPLANT
GOWN BRE IMP PREV XXLGXLNG (GOWN DISPOSABLE) ×3 IMPLANT
GOWN PREVENTION PLUS XLARGE (GOWN DISPOSABLE) ×4 IMPLANT
HOLDER KNEE FOAM BLUE (MISCELLANEOUS) ×2 IMPLANT
KNEE WRAP E Z 3 GEL PACK (MISCELLANEOUS) ×1 IMPLANT
PACK ARTHROSCOPY DSU (CUSTOM PROCEDURE TRAY) ×2 IMPLANT
PACK BASIN DAY SURGERY FS (CUSTOM PROCEDURE TRAY) ×2 IMPLANT
PADDING WEBRIL 4 STERILE (GAUZE/BANDAGES/DRESSINGS) ×1 IMPLANT
PENCIL BUTTON HOLSTER BLD 10FT (ELECTRODE) ×1 IMPLANT
SET ARTHROSCOPY TUBING (MISCELLANEOUS) ×2
SET ARTHROSCOPY TUBING LN (MISCELLANEOUS) ×1 IMPLANT
SPONGE GAUZE 4X4 12PLY (GAUZE/BANDAGES/DRESSINGS) ×3 IMPLANT
SUT ETHILON 3 0 PS 1 (SUTURE) ×2 IMPLANT
SUT VIC AB 3-0 FS2 27 (SUTURE) IMPLANT
TOWEL OR 17X24 6PK STRL BLUE (TOWEL DISPOSABLE) ×2 IMPLANT
WATER STERILE IRR 1000ML POUR (IV SOLUTION) ×2 IMPLANT

## 2011-10-15 NOTE — Interval H&P Note (Signed)
History and Physical Interval Note:  10/15/2011 7:43 AM  Tim Mccullough  has presented today for surgery, with the diagnosis of lmt  The various methods of treatment have been discussed with the patient and family. After consideration of risks, benefits and other options for treatment, the patient has consented to  Procedure(s): ARTHROSCOPY KNEE as a surgical intervention .  The patients' history has been reviewed, patient examined, no change in status, stable for surgery.  I have reviewed the patients' chart and labs.  Questions were answered to the patient's satisfaction.     [name]

## 2011-10-15 NOTE — Anesthesia Procedure Notes (Signed)
Procedure Name: LMA Insertion Date/Time: 10/15/2011 11:11 AM Performed by: Sharyne Richters Pre-anesthesia Checklist: Patient identified, Emergency Drugs available, Suction available, Patient being monitored and Timeout performed Patient Re-evaluated:Patient Re-evaluated prior to inductionOxygen Delivery Method: Circle System Utilized Preoxygenation: Pre-oxygenation with 100% oxygen Intubation Type: IV induction Ventilation: Mask ventilation without difficulty LMA: LMA with gastric port inserted LMA Size: 4.0 Number of attempts: 1 Airway Equipment and Method: bite block Placement Confirmation: positive ETCO2 and breath sounds checked- equal and bilateral Tube secured with: Tape Dental Injury: Teeth and Oropharynx as per pre-operative assessment

## 2011-10-15 NOTE — Anesthesia Postprocedure Evaluation (Signed)
  Anesthesia Post-op Note  Patient: Tim Mccullough  Procedure(s) Performed:  ARTHROSCOPY KNEE - right knee scope with lateral meniscectomy, removal loose foreign body, and microfracture technique  Patient Location: PACU  Anesthesia Type: General  Level of Consciousness: awake, alert  and oriented  Airway and Oxygen Therapy: Patient Spontanous Breathing and Patient connected to face mask oxygen  Post-op Pain: mild  Post-op Assessment: Post-op Vital signs reviewed, PATIENT'S CARDIOVASCULAR STATUS UNSTABLE, Respiratory Function Stable, Patent Airway and No signs of Nausea or vomiting  Post-op Vital Signs: Reviewed and stable  Complications: No apparent anesthesia complications

## 2011-10-15 NOTE — Transfer of Care (Signed)
Immediate Anesthesia Transfer of Care Note  Patient: Tim Mccullough [facility]  Procedure(s) Performed:  ARTHROSCOPY KNEE - right knee scope with lateral meniscectomy, removal loose foreign body, and microfracture technique  Patient Location: PACU  Anesthesia Type: General  Level of Consciousness: awake, oriented and patient cooperative  Airway & Oxygen Therapy: Patient Spontanous Breathing and Patient connected to face mask oxygen  Post-op Assessment: Report given to PACU RN, Post -op Vital signs reviewed and stable and Patient moving all extremities  Post vital signs: Reviewed and stable  Complications: No apparent anesthesia complications

## 2011-10-15 NOTE — Brief Op Note (Signed)
10/15/2011  12:23 PM  PATIENT:  Lauris Poag Burklow  60 y.o. male  PRE-OPERATIVE DIAGNOSIS:  lmt  POST-OPERATIVE DIAGNOSIS:  Right Knee Lateral Meniscus Tear, Chndromalacia Lateral Femoral Condyle  PROCEDURE:  Procedure(s): Left ARTHROSCOPY KNEE, partial lat meniscectomy, lfc chondroplasty/microfracturing  SURGEON:  Surgeon(s): Loreta Ave, MD  PHYSICIAN ASSISTANT: Zonia Kief M    ANESTHESIA:   general  EBL:  Total I/O In: 1700 [I.V.:1700] Out: -      SPECIMEN:  No Specimen  DISPOSITION OF SPECIMEN:  N/A  COUNTS:  YES  TOURNIQUET:  * No tourniquets in log *   PATIENT DISPOSITION:  PACU - hemodynamically stable.

## 2011-10-15 NOTE — Anesthesia Preprocedure Evaluation (Addendum)
Anesthesia Evaluation  Patient identified by MRN, date of birth, ID band Patient awake    Reviewed: Allergy & Precautions, H&P , NPO status , Patient's Chart, lab work & pertinent test results, reviewed documented beta blocker date and time   Airway Mallampati: II TM Distance: >3 FB Neck ROM: full    Dental No notable dental hx. (+) Teeth Intact   Pulmonary sleep apnea ,  clear to auscultation  Pulmonary exam normal       Cardiovascular hypertension, On Medications and On Home Beta Blockers regular Normal    Neuro/Psych Negative Neurological ROS  Negative Psych ROS   GI/Hepatic negative GI ROS, Neg liver ROS, Medicated and Controlled,  Endo/Other  Well Controlled, Type 2  Renal/GU negative Renal ROS  Genitourinary negative   Musculoskeletal   Abdominal   Peds  Hematology negative hematology ROS (+)   Anesthesia Other Findings   Reproductive/Obstetrics negative OB ROS                          Anesthesia Physical Anesthesia Plan  ASA: III  Anesthesia Plan: General   Post-op Pain Management:    Induction: Intravenous  Airway Management Planned: LMA  Additional Equipment:   Intra-op Plan:   Post-operative Plan: Extubation in OR  Informed Consent: I have reviewed the patients History and Physical, chart, labs and discussed the procedure including the risks, benefits and alternatives for the proposed anesthesia with the patient or authorized representative who has indicated his/her understanding and acceptance.     Plan Discussed with: CRNA  Anesthesia Plan Comments:         Anesthesia Quick Evaluation

## 2011-10-16 ENCOUNTER — Encounter (HOSPITAL_BASED_OUTPATIENT_CLINIC_OR_DEPARTMENT_OTHER): Payer: Self-pay | Admitting: Orthopedic Surgery

## 2011-10-16 NOTE — Op Note (Signed)
[name], Tim Mccullough NO.:  [id number]  MEDICAL RECORD NO.:  [id number]  LOCATION:                                 FACILITY:  PHYSICIAN:  Loreta Ave, M.D.      DATE OF BIRTH:  DATE OF PROCEDURE:  10/15/2011 DATE OF DISCHARGE:                              OPERATIVE REPORT   PREOPERATIVE DIAGNOSIS:  Right knee chondromalacia patella, chondral loose bodies, and lateral meniscus tear.  POSTOPERATIVE DIAGNOSIS:  Right knee extensive grade 4 osteochondral lesion over half the weightbearing dome lateral femoral condyle with breakdown of cartilage and some subchondral bone.  Numerous small loose bodies throughout.  Grade 3 approaching grade 4 changes, medial trochlea.  Medial plica.  Extensive radial tearing throughout the lateral meniscus.  PROCEDURE:  Right knee exam under anesthesia, arthroscopy.  Partial lateral meniscectomy.  Excision medial plica.  Chondroplasty, lateral femoral condyle and trochlea.  Microfracturing lateral femoral condyle.  SURGEON:  Loreta Ave, MD  ASSISTANT:  Genene Churn. Barry Dienes, [place]  ANESTHESIA:  General.  BLOOD LOSS:  Minimal.  SPECIMENS:  None.  CULTURES:  None.  COMPLICATIONS:  None.  DRESSING:  Soft compressive.  TOURNIQUET:  Not employed.  PROCEDURE:  The patient was brought to the operating room and placed on the operating table in supine position.  After adequate anesthesia had been obtained, leg holder applied, leg prepped and draped in usual sterile fashion.  Two portals, 1 each medial and lateral parapatellar. Arthroscope introduced, knee distended and inspected.  Numerous small chondral loose bodies and debris throughout cleared out.  I looked in all recesses and no other large loose bodies were seen.  Patellofemoral joint had reasonable tracking, but grade 3 approaching grade 4 changes over the entire medial half of the trochlea.  Chondroplasty to a stable surface.  Some changes on the patella, but not as  bad.  Large fibrotic medial plica excised.  Medial meniscus, medial compartment looked good. ACL intact.  Lateral compartment revealed extensive radial tearing throughout the entire lateral meniscus.  Debrided back to stable margins throughout.  Unfortunately, a very large, deep grade 4 lesion over a third to half of the lateral femoral condyle lateral half.  This was denuded of cartilage and part of the subchondral bone below this collapse.  Debrided the sharp margins throughout.  Some grade 3 changes on the plateau debrided.  I then did microfracturing in the grade 4 lesion on the condyle reducing fluid impression to confirm bleeding. Once that was complete, the entire knee examined, no other findings appreciated.  Instruments and fluid removed.  Portals closed with nylon. Knee injected with Depo-Medrol and Marcaine.  Anesthesia reversed. Brought to the recovery room.  Tolerated the surgery well.  No complications.     Loreta Ave, M.D.     DFM/MEDQ  D:  10/15/2011  T:  10/16/2011  Job:  [id number]

## 2011-10-18 ENCOUNTER — Other Ambulatory Visit: Payer: Self-pay | Admitting: Cardiology

## 2011-10-19 ENCOUNTER — Other Ambulatory Visit: Payer: Self-pay | Admitting: Cardiology

## 2011-11-17 ENCOUNTER — Other Ambulatory Visit: Payer: Self-pay | Admitting: *Deleted

## 2011-11-17 MED ORDER — PAROXETINE HCL 20 MG PO TABS: 20.0000 mg | ORAL_TABLET | Freq: Every day | ORAL | Status: DC

## 2011-11-17 NOTE — Telephone Encounter (Signed)
Received faxed refill request from pharmacy. Refill sent to pharmacy electronically. 

## 2011-11-27 ENCOUNTER — Ambulatory Visit
Admission: RE | Admit: 2011-11-27 | Discharge: 2011-11-27 | Disposition: A | Discharge status: Home / Self Care | Payer: 59 | Source: Ambulatory Visit | Attending: Orthopedic Surgery | Admitting: Orthopedic Surgery

## 2011-11-27 ENCOUNTER — Other Ambulatory Visit: Payer: Self-pay | Admitting: Orthopedic Surgery

## 2011-11-27 DIAGNOSIS — M79669 Pain in unspecified lower leg: Secondary | ICD-10-CM

## 2011-12-07 ENCOUNTER — Other Ambulatory Visit: Payer: Self-pay | Admitting: Cardiology

## 2011-12-25 ENCOUNTER — Other Ambulatory Visit: Payer: Self-pay | Admitting: Family Medicine

## 2011-12-28 NOTE — Telephone Encounter (Signed)
Electronic refill request

## 2011-12-29 ENCOUNTER — Other Ambulatory Visit: Payer: Self-pay | Admitting: Gastroenterology

## 2011-12-29 NOTE — Telephone Encounter (Signed)
NEEDS OFFICE VISIT FOR ANY FURTHER REFILLS! 

## 2011-12-29 NOTE — Telephone Encounter (Signed)
Sent!

## 2011-12-30 ENCOUNTER — Other Ambulatory Visit: Payer: Self-pay

## 2011-12-30 MED ORDER — LISINOPRIL 2.5 MG PO TABS: 2.5000 mg | ORAL_TABLET | Freq: Every day | ORAL | Status: DC

## 2011-12-30 NOTE — Telephone Encounter (Signed)
Patient called requesting refills on his Lisinopril.  He has run out and been waiting for [place] to approve it through his pharmacy.  He is scheduled for an appointment tomorrow 12/31/11.

## 2011-12-31 ENCOUNTER — Other Ambulatory Visit: Payer: 59

## 2012-01-01 ENCOUNTER — Other Ambulatory Visit: Payer: Self-pay | Admitting: *Deleted

## 2012-01-01 MED ORDER — CARVEDILOL 25 MG PO TABS: 25.0000 mg | ORAL_TABLET | Freq: Two times a day (BID) | ORAL | Status: DC

## 2012-01-06 ENCOUNTER — Other Ambulatory Visit: Payer: Self-pay | Admitting: Cardiology

## 2012-01-07 ENCOUNTER — Ambulatory Visit: Payer: 59 | Admitting: Family Medicine

## 2012-02-01 ENCOUNTER — Encounter: Payer: Self-pay | Admitting: Family Medicine

## 2012-02-01 ENCOUNTER — Ambulatory Visit (INDEPENDENT_AMBULATORY_CARE_PROVIDER_SITE_OTHER): Payer: 59 | Admitting: Family Medicine

## 2012-02-01 ENCOUNTER — Ambulatory Visit: Payer: 59 | Admitting: Family Medicine

## 2012-02-01 VITALS — BP 98/50 | HR 92 | Temp 97.4°F | Wt 248.0 lb

## 2012-02-01 DIAGNOSIS — E119 Type 2 diabetes mellitus without complications: Secondary | ICD-10-CM

## 2012-02-01 DIAGNOSIS — R509 Fever, unspecified: Secondary | ICD-10-CM

## 2012-02-01 MED ORDER — GLUCOSE BLOOD VI STRP: ORAL_STRIP | Status: DC

## 2012-02-01 MED ORDER — LANCETS 28G MISC: Status: DC

## 2012-02-01 NOTE — Progress Notes (Signed)
Friday had diffuse aches with a fever.  No sputum, no congestion, no cough.  No rash.  He is fatigued.  Had 1 episode of stomach upset, indigestion, but no vomiting.  Then had 1 day of diarrhea.  Dec in appetite.  Still drinking fluids well.  He doesn't feel worse than yesterday, maybe some better today.  He was able to drive up here today.  Not orthostatic.    H/o DM2, due for A1c.  This is pending.   Meds, vitals, and allergies reviewed.   ROS: See HPI.  Otherwise, noncontributory.  nad ncat Mmm rrr ctab abd soft TM wnl, nasal exam mildly stuffy Sinuses not ttp x4

## 2012-02-01 NOTE — Patient Instructions (Signed)
Go the lab for your A1c.  Drink plenty of fluids, take ibuprofen with food as needed, and gargle with warm salt water for your throat.  This should gradually improve.  Take care.  Let us know if you have other concerns.   We'll contact you with your lab report.

## 2012-02-02 ENCOUNTER — Other Ambulatory Visit: Payer: Self-pay | Admitting: Family Medicine

## 2012-02-02 ENCOUNTER — Encounter: Payer: Self-pay | Admitting: Family Medicine

## 2012-02-02 ENCOUNTER — Encounter: Payer: Self-pay | Admitting: *Deleted

## 2012-02-02 DIAGNOSIS — E119 Type 2 diabetes mellitus without complications: Secondary | ICD-10-CM

## 2012-02-02 DIAGNOSIS — R509 Fever, unspecified: Secondary | ICD-10-CM | POA: Insufficient documentation

## 2012-02-02 NOTE — Assessment & Plan Note (Signed)
Nonspecific, nontoxic, will follow clinically.  No obvious source.  Okay for outpatient f/u.  Likely benign viral process.

## 2012-02-02 NOTE — Assessment & Plan Note (Signed)
D/w pt about diet and weight.  See notes on labs.   

## 2012-02-06 ENCOUNTER — Other Ambulatory Visit: Payer: Self-pay | Admitting: Cardiology

## 2012-02-06 ENCOUNTER — Other Ambulatory Visit: Payer: Self-pay | Admitting: Gastroenterology

## 2012-02-08 ENCOUNTER — Ambulatory Visit (INDEPENDENT_AMBULATORY_CARE_PROVIDER_SITE_OTHER): Payer: 59 | Admitting: Family Medicine

## 2012-02-08 ENCOUNTER — Encounter: Payer: Self-pay | Admitting: Family Medicine

## 2012-02-08 VITALS — BP 108/66 | HR 90 | Temp 98.4°F | Wt 246.8 lb

## 2012-02-08 DIAGNOSIS — R42 Dizziness and giddiness: Secondary | ICD-10-CM

## 2012-02-08 DIAGNOSIS — B029 Zoster without complications: Secondary | ICD-10-CM

## 2012-02-08 MED ORDER — HYDROCODONE-ACETAMINOPHEN 5-500 MG PO TABS: 1.0000 | ORAL_TABLET | Freq: Four times a day (QID) | ORAL | Status: AC | PRN

## 2012-02-08 MED ORDER — VALACYCLOVIR HCL 1 G PO TABS: 1000.0000 mg | ORAL_TABLET | Freq: Three times a day (TID) | ORAL | Status: AC

## 2012-02-08 NOTE — Patient Instructions (Signed)
Start the valtrex today and use the vicodin for pain.  Take care.

## 2012-02-08 NOTE — Progress Notes (Signed)
Since last OV, now with dermatomal painful rash on R side of trunk.   No fevers.  Felt dizzy when getting out of the bed this AM- the room was spinning.  Still with occ HA.  No ear pain, but "my ears feel a little weird."  No cough.  Prev sx resolved.   Not dizzy with head turning here in the clinic.  No h/o presyncope sx.    Meds, vitals, and allergies reviewed.   ROS: See HPI.  Otherwise, noncontributory.  nad ncat Tm wnl Op wnl Neck supple rrr ctab abd benign but dermatomal and painful rash on R side of trunk

## 2012-02-09 ENCOUNTER — Encounter: Payer: Self-pay | Admitting: Family Medicine

## 2012-02-09 DIAGNOSIS — R42 Dizziness and giddiness: Secondary | ICD-10-CM | POA: Insufficient documentation

## 2012-02-09 DIAGNOSIS — B029 Zoster without complications: Secondary | ICD-10-CM | POA: Insufficient documentation

## 2012-02-09 NOTE — Assessment & Plan Note (Addendum)
1 episode this AM, resolved now and not reproduced in the clinic.  Likely BPV.  I doubt this is related to other illnesses and should resolve.

## 2012-02-09 NOTE — Assessment & Plan Note (Signed)
Path/phys d/w pt.  Start valtex and hydrocodone.  Call back as needed.  He understood.

## 2012-02-15 ENCOUNTER — Other Ambulatory Visit: Payer: Self-pay | Admitting: Gastroenterology

## 2012-02-15 NOTE — Telephone Encounter (Signed)
NEEDS OFFICE VISIT FOR ANY FURTHER REFILLS! 

## 2012-02-22 ENCOUNTER — Other Ambulatory Visit: Payer: Self-pay | Admitting: Family Medicine

## 2012-03-06 ENCOUNTER — Other Ambulatory Visit: Payer: Self-pay | Admitting: Cardiology

## 2012-03-08 ENCOUNTER — Telehealth: Payer: Self-pay | Admitting: Cardiology

## 2012-03-08 ENCOUNTER — Telehealth: Payer: Self-pay | Admitting: *Deleted

## 2012-03-08 NOTE — Telephone Encounter (Signed)
Patient says he is to have knee surgery with Dr. Eulah Pont and that office says they have faxed 2 requests for surgical clearance and have not heard from [place].  Are you aware of any requests?  I wonder if it was sent to your old office.

## 2012-03-08 NOTE — Telephone Encounter (Signed)
Spoke with pt. Per Dr Shirlee Latch. Pt needs to schedule an appt before being cleared for surgery. Pt was previously seen by Dr Shirlee Latch in [place]. OK for pt to see another provider in the [place] office.  Pt prefers to be seen in the [place] office. I have asked pt to call the [facility] office to schedule an appt. Harriett Sine T also talked with pt's daughter and faxed surgical clearance request to the  [place] office this morning.

## 2012-03-08 NOTE — Telephone Encounter (Signed)
Pt needs surgical clearance faxed from dr Eulah Pont about 3 weeks ago, checking on status ,pls call [phone]

## 2012-03-08 NOTE — Telephone Encounter (Signed)
I have not gotten papers re: surgery.  He is going to be seen by cards about this.  See other phone note from today.

## 2012-03-08 NOTE — Telephone Encounter (Signed)
Patient advised that we have not received any faxes.  He states he is seeing Cardiology on Tuesday.  He will let Dr. Greig Right office know that we have not received the faxes and be sure they are using the number to [place].

## 2012-03-14 ENCOUNTER — Other Ambulatory Visit: Payer: Self-pay | Admitting: Family Medicine

## 2012-03-15 ENCOUNTER — Encounter: Payer: Self-pay | Admitting: Cardiovascular Disease

## 2012-03-15 ENCOUNTER — Ambulatory Visit (INDEPENDENT_AMBULATORY_CARE_PROVIDER_SITE_OTHER): Payer: 59 | Admitting: Cardiovascular Disease

## 2012-03-15 VITALS — BP 112/66 | HR 88 | Ht 71.0 in | Wt 236.0 lb

## 2012-03-15 DIAGNOSIS — R000 Tachycardia, unspecified: Secondary | ICD-10-CM

## 2012-03-15 DIAGNOSIS — I5022 Chronic systolic (congestive) heart failure: Secondary | ICD-10-CM

## 2012-03-15 DIAGNOSIS — Z01810 Encounter for preprocedural cardiovascular examination: Secondary | ICD-10-CM | POA: Insufficient documentation

## 2012-03-15 DIAGNOSIS — Q210 Ventricular septal defect: Secondary | ICD-10-CM

## 2012-03-15 DIAGNOSIS — R0600 Dyspnea, unspecified: Secondary | ICD-10-CM

## 2012-03-15 DIAGNOSIS — I10 Essential (primary) hypertension: Secondary | ICD-10-CM

## 2012-03-15 NOTE — Assessment & Plan Note (Signed)
He appears to be euvolemic. He is on both lisinopril and losartan. I recommend stopping lisinopril especially that he is also on spironolactone which increases the risk of hyperkalemia. Continue other cardiac medications.

## 2012-03-15 NOTE — Assessment & Plan Note (Signed)
The patient has known history of nonischemic cardiomyopathy. He had a heart catheterization in 2010 that showed no obstructive coronary artery disease. He is not having any clear symptoms of angina but does  report worsening dyspnea. Thus, I recommend an echocardiogram to evaluate his LV systolic function. If his ejection fraction is stable, he should be considered at low risk for cardiovascular complications. Spironolactone should be held on the day of surgery.

## 2012-03-15 NOTE — Patient Instructions (Signed)
Stop taking Lisinopril.   Your physician has requested that you have an echocardiogram. Echocardiography is a painless test that uses sound waves to create images of your heart. It provides your doctor with information about the size and shape of your heart and how well your heart's chambers and valves are working. This procedure takes approximately one hour. There are no restrictions for this procedure.  We will send clearance to your surgeon after the echo results are available.   Follow up in 6 months.

## 2012-03-15 NOTE — Progress Notes (Signed)
HPI  This is a 61 year old man who is here today for a preoperative cardiovascular evaluation for an anticipated knee surgery. He has known history of nonischemic cardiomyopathy which was diagnosed in 2010. He had cardiac catheterization done which showed minor luminal irregularities without obstructive CAD. Ejection fraction was 35%. Most recent ejection fraction was 50-55% last year. He reports worsening dyspnea without chest pain, orthopnea or PND. His physical activities are very limited but he is able to perform his activities of daily living.  Allergies  Allergen Reactions  . Metformin     REACTION: intolerant, h/o elevated Cr 2011     Current Outpatient Prescriptions on File Prior to Visit  Medication Sig Dispense Refill  . amLODipine (NORVASC) 5 MG tablet Take 1 tablet (5 mg total) by mouth daily.  30 tablet  5  . aspirin 81 MG tablet Take 81 mg by mouth 2 (two) times daily.       . carvedilol (COREG) 25 MG tablet Take 1 tablet (25 mg total) by mouth 2 (two) times daily with a meal.  180 tablet  1  . cholecalciferol (VITAMIN D) 1000 UNITS tablet Take 1,000 Units by mouth daily.        Marland Kitchen doxazosin (CARDURA) 1 MG tablet TAKE 1 TABLET BY MOUTH EVERY DAY AT BEDTIME  30 tablet  9  . fish oil-omega-3 fatty acids 1000 MG capsule Take 2 g by mouth daily.       Marland Kitchen glipiZIDE (GLUCOTROL XL) 5 MG 24 hr tablet TAKE 1 TABLET BY MOUTH EVERY MORNING  30 tablet  12  . glucose blood (ONE TOUCH ULTRA TEST) test strip Check blood sugar daily and if blood sugar fluctuates.  250.00  100 each  3  . Lancets 28G MISC Use daily as needed to check sugar, dx 250.00  100 each  3  . losartan (COZAAR) 100 MG tablet TAKE 1 TABLET BY MOUTH EVERY DAY AND 1/2 TABLET AT NIGHT  45 tablet  6  . omeprazole (PRILOSEC) 40 MG capsule TAKE ONE CAPSULE BY MOUTH EVERY DAY 30 MINUTES BEORE A MEAL  34 capsule  0  . PARoxetine (PAXIL) 20 MG tablet TAKE 1 TABLET BY MOUTH DAILY  30 tablet  5  . pravastatin (PRAVACHOL) 40 MG  tablet TAKE 1 TABLET BY MOUTH DAILY AT BEDTIME  30 tablet  6  . spironolactone (ALDACTONE) 25 MG tablet TAKE 1 TABLET BY MOUTH EVERY DAY  30 tablet  0     Past Medical History  Diagnosis Date  . Diabetes mellitus 11/2006    Type II  . Hyperlipidemia 04/1998  . Hypertension 1995  . BPH (benign prostatic hypertrophy) 2004  . Sinus tachycardia     Pt says he has been told his heart rate is high since he was in high school.   . Holter monitor, abnormal 7/10    Frequent PAC's and PVC's  Average HR 96  (but he had started Toprol XL).  HR range 71-130  . OSA (obstructive sleep apnea)     Not using CPAP  . Obesity   . Hip pain   . Cardiomyopathy     ? tachycardia-mediated  . Echocardiogram abnormal 7/10    Mild LVH with a mildly dilated LV.  EF was difficult to assess given poor acoustic windows but appeared moderately decreased.  Mild MR.  Probable small perimembranous VSD, mild LAE  . History of MRI 7/10    Cardiac MRI was done to followup difficult echo.  This showed mild to moderately dilated LV, mild to moderate LAE, EF  33% with global hypokinesis, normal RV size with mild systolic dysfunction.  There was no large VSD evident.  There was no myocardial delayed enhancement so no evidence for infiltrative disease or infarction.  LHC (7/10) showed only luminal irreg. in the coronaries and EF 35%  . Abnormal echocardiogram 2/11    Repeated after med mgmt of cardiomyopathy showed EF 40-45%, mildly dilated LV, mild LV hypertrophy, anterolateral and apical hypokinesis, mild diastolic dysfunction.  . VSD (ventricular septal defect)     Probable small perimembranous  . Diverticulosis   . Depression   . Esophageal erosions      Past Surgical History  Procedure Date  . Rotator cuff repair 2/11    Left  . Inguinal hernia repair 1985    Left  . Laminectomy 1987    with discectomy  . Inguinal hernia repair 1999    Right  . Total hip arthroplasty 2011    Left  .  Esophagogastroduodenoscopy 2012    1)Barrett's esoph, 2) Erosive esophagitis, 3) Mild gastritis in the antrum, 4) Duodenitis in the bulb of duodenum, 5) Small hiatal hernia  . Colonoscopy w/ biopsies 08/24/2006    Divertics, polyps x3, bx negative, repeat 2012 with mild diverticulosis in teh sigmoid colon and 1 polyp in tranverse colon, repeat due 2017  . Knee arthroscopy 10/15/2011    Procedure: ARTHROSCOPY KNEE;  Surgeon: Loreta Ave, MD;  Location: [facility];  Service: Orthopedics;  Laterality: Right;  right knee scope with lateral meniscectomy, removal loose foreign body, and microfracture technique  . Cardiac catheterization 05/30/2009    Dilated cardiomyopathy.  min VSD.  Hypokin Inf  wall.  Multiple min luminal Irr.  EF 35%     Family History  Problem Relation Age of Onset  . Heart disease Father     CABG  . Stroke Father   . Diabetes Father   . Hypertension Mother   . Alzheimer's disease Mother   . Diabetes Sister   . Hypertension Sister   . Diabetes Brother   . Cancer Brother     Throat  . Diabetes Maternal Grandmother     Insulin  . Drug abuse Neg Hx   . Diabetes Sister   . Hypertension Sister   . Cancer Other     Throat  . Diabetes Other   . Alcohol abuse Other      History   Social History  . Marital Status: Divorced    Spouse Name: N/A    Number of Children: N/A  . 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 Smoking status: Former Smoker    Types: Cigarettes    Quit date: 11/09/1998  . Smokeless tobacco: Former Neurosurgeon  . Alcohol Use: Yes     Usually weekends  . Drug Use: No  . Sexually Active: Not on file   Other Topics Concern  . Not on file   Social History Narrative   Lives in [place]     ROS Constitutional: Negative for fever, chills, diaphoresis, activity change, appetite change and fatigue.  HENT: Negative for  hearing loss, nosebleeds, congestion, sore throat, facial swelling, drooling, trouble swallowing, neck pain, voice change, sinus pressure and tinnitus.  Eyes: Negative for photophobia, pain, discharge and visual disturbance.  Respiratory: Negative for apnea, cough, chest tightness and  wheezing.  Cardiovascular: Negative for chest pain, palpitations and leg swelling.  Gastrointestinal: Negative for nausea, vomiting, abdominal pain, diarrhea, constipation, blood in stool and abdominal distention.  Genitourinary: Negative for dysuria, urgency, frequency, hematuria and decreased urine volume.  Musculoskeletal: Negative for back pain and gait problem.  Skin: Negative for color change, pallor, rash and wound.  Neurological: Negative for dizziness, tremors, seizures, syncope, speech difficulty, weakness, light-headedness, numbness and headaches.  Psychiatric/Behavioral: Negative for suicidal ideas, hallucinations, behavioral problems and agitation. The patient is not nervous/anxious.     PHYSICAL EXAM   BP 112/66  Pulse 88  Ht 5\' 11"  (1.803 m)  Wt 236 lb (107.049 kg)  BMI 32.92 kg/m2 Constitutional: He is oriented to person, place, and time. He appears well-developed and well-nourished. No distress.  HENT: No nasal discharge.  Head: Normocephalic and atraumatic.  Eyes: Pupils are equal and round. Right eye exhibits no discharge. Left eye exhibits no discharge.  Neck: Normal range of motion. Neck supple. No JVD present. No thyromegaly present.  Cardiovascular: Normal rate, regular rhythm, normal heart sounds and. Exam reveals no gallop and no friction rub. No murmur heard.  Pulmonary/Chest: Effort normal and breath sounds normal. No stridor. No respiratory distress. He has no wheezes. He has no rales. He exhibits no tenderness.  Abdominal: Soft. Bowel sounds are normal. He exhibits no distension. There is no tenderness. There is no rebound and no guarding.  Musculoskeletal: Normal range of  motion. He exhibits no edema and no tenderness.  Neurological: He is alert and oriented to person, place, and time. Coordination normal.  Skin: Skin is warm and dry. No rash noted. He is not diaphoretic. No erythema. No pallor.  Psychiatric: He has a normal mood and affect. His behavior is normal. Judgment and thought content normal.       EKG: Sinus  Rhythm -  Nonspecific T-abnormality.    ASSESSMENT AND PLAN

## 2012-03-15 NOTE — Assessment & Plan Note (Signed)
His blood pressure is well controlled but is somewhat on the low side. Lisinopril will be stopped as outlined above.

## 2012-03-23 ENCOUNTER — Other Ambulatory Visit: Payer: Self-pay | Admitting: Gastroenterology

## 2012-03-23 ENCOUNTER — Other Ambulatory Visit: Payer: 59

## 2012-03-24 ENCOUNTER — Other Ambulatory Visit (INDEPENDENT_AMBULATORY_CARE_PROVIDER_SITE_OTHER): Payer: 59

## 2012-03-24 ENCOUNTER — Other Ambulatory Visit: Payer: Self-pay

## 2012-03-24 ENCOUNTER — Other Ambulatory Visit: Payer: Self-pay | Admitting: Cardiology

## 2012-03-24 DIAGNOSIS — I429 Cardiomyopathy, unspecified: Secondary | ICD-10-CM

## 2012-03-24 DIAGNOSIS — R0602 Shortness of breath: Secondary | ICD-10-CM

## 2012-03-28 ENCOUNTER — Telehealth: Payer: Self-pay

## 2012-03-28 ENCOUNTER — Other Ambulatory Visit: Payer: Self-pay | Admitting: Gastroenterology

## 2012-03-28 NOTE — Telephone Encounter (Signed)
Error

## 2012-03-31 ENCOUNTER — Telehealth: Payer: Self-pay | Admitting: Cardiovascular Disease

## 2012-03-31 NOTE — Telephone Encounter (Signed)
Lm he is cleared for surg. Per echo results and last note.

## 2012-03-31 NOTE — Telephone Encounter (Signed)
Pt called wanting to know if he was cleared to have knee surgery.

## 2012-04-01 NOTE — Telephone Encounter (Signed)
Pt called back told him that he was cleared for surgery per your note. Pt said that he had to have a note sent to [facility] for the clearance.

## 2012-04-01 NOTE — Telephone Encounter (Signed)
Pt says MD office has yet to receive fax we sent 5/17.  Will refax to diff. #.

## 2012-04-11 ENCOUNTER — Other Ambulatory Visit: Payer: Self-pay | Admitting: Family Medicine

## 2012-04-11 ENCOUNTER — Other Ambulatory Visit: Payer: Self-pay | Admitting: Cardiology

## 2012-04-11 NOTE — Telephone Encounter (Signed)
..   Requested Prescriptions   Pending Prescriptions Disp Refills  . spironolactone (ALDACTONE) 25 MG tablet [Pharmacy Med Name: SPIRONOLACTONE 25 MG TABLET] 30 tablet 6    Sig: TAKE 1 TABLET BY MOUTH EVERY DAY  Patient had appointment with Dr Kirke Corin in may

## 2012-04-16 ENCOUNTER — Other Ambulatory Visit: Payer: Self-pay | Admitting: Family Medicine

## 2012-04-18 ENCOUNTER — Other Ambulatory Visit: Payer: Self-pay | Admitting: *Deleted

## 2012-04-18 ENCOUNTER — Other Ambulatory Visit: Payer: Self-pay | Admitting: Cardiology

## 2012-04-18 ENCOUNTER — Encounter: Payer: Self-pay | Admitting: *Deleted

## 2012-04-18 NOTE — Telephone Encounter (Signed)
Prev was on this medicine.  Refilled.

## 2012-04-18 NOTE — Telephone Encounter (Signed)
This medication was not on the patient's medication list, added for refill request.  Please advise.

## 2012-05-16 ENCOUNTER — Other Ambulatory Visit: Payer: Self-pay | Admitting: Family Medicine

## 2012-05-19 ENCOUNTER — Encounter (HOSPITAL_COMMUNITY): Payer: Self-pay | Admitting: Pharmacy Technician

## 2012-05-25 ENCOUNTER — Encounter (HOSPITAL_COMMUNITY): Payer: Self-pay

## 2012-05-25 ENCOUNTER — Encounter (HOSPITAL_COMMUNITY)
Admission: RE | Admit: 2012-05-25 | Discharge: 2012-05-25 | Disposition: A | Discharge status: Home / Self Care | Payer: 59 | Source: Ambulatory Visit | Attending: Surgery | Admitting: Surgery

## 2012-05-25 ENCOUNTER — Encounter (HOSPITAL_COMMUNITY)
Admission: RE | Admit: 2012-05-25 | Discharge: 2012-05-25 | Disposition: A | Discharge status: Home / Self Care | Payer: 59 | Source: Ambulatory Visit | Attending: Orthopedic Surgery | Admitting: Orthopedic Surgery

## 2012-05-25 HISTORY — DX: Anemia, unspecified: D64.9

## 2012-05-25 HISTORY — DX: Malignant (primary) neoplasm, unspecified: C80.1

## 2012-05-25 HISTORY — DX: Gastro-esophageal reflux disease without esophagitis: K21.9

## 2012-05-25 HISTORY — DX: Unspecified osteoarthritis, unspecified site: M19.90

## 2012-05-25 LAB — CBC
Platelets: 248 10*3/uL (ref 150–400)
RBC: 3.66 MIL/uL — ABNORMAL LOW (ref 4.22–5.81)
WBC: 6.3 10*3/uL (ref 4.0–10.5)

## 2012-05-25 LAB — URINALYSIS, ROUTINE W REFLEX MICROSCOPIC
Ketones, ur: NEGATIVE mg/dL
Leukocytes, UA: NEGATIVE
Nitrite: NEGATIVE
pH: 5.5 (ref 5.0–8.0)

## 2012-05-25 LAB — COMPREHENSIVE METABOLIC PANEL
ALT: 17 U/L (ref 0–53)
AST: 16 U/L (ref 0–37)
Alkaline Phosphatase: 87 U/L (ref 39–117)
CO2: 22 mEq/L (ref 19–32)
Chloride: 105 mEq/L (ref 96–112)
GFR calc non Af Amer: 57 mL/min — ABNORMAL LOW (ref >90)
Potassium: 5.3 mEq/L — ABNORMAL HIGH (ref 3.5–5.1)
Sodium: 138 mEq/L (ref 135–145)
Total Bilirubin: 0.3 mg/dL (ref 0.3–1.2)

## 2012-05-25 NOTE — Pre-Procedure Instructions (Addendum)
[address] Tim Mccullough  05/25/2012   Your procedure is scheduled on:  06/01/12  Report to [facility] at 730 AM.  Call this number if you have problems the morning of surgery: [phone]   Remember:   Do not eat food or drink:After Midnight.  .  Take these medicines the morning of surgery with A SIP OF WATER: amlodipine, carvedilol, prevacid, paxil if needed STOP aspirin, fish oil, any herbal meds, blood thinners, nsaids   Do not wear jewelry, make-up or nail polish.  Do not wear lotions, powders, or perfumes. You may wear deodorant.  Do not shave 48 hours prior to surgery. Men may shave face and neck.  Do not bring valuables to the hospital.  Contacts, dentures or bridgework may not be worn into surgery.  Leave suitcase in the car. After surgery it may be brought to your room.  For patients admitted to the hospital, checkout time is 11:00 AM the day of discharge.   Patients discharged the day of surgery will not be allowed to drive home.  Name and phone number of your driver: dtr carrie lowery [phone]  dtr in [place] delshon blanchfield [phone]  Special Instructions: Incentive Spirometry - Practice and bring it with you on the day of surgery. and CHG Shower Use Special Wash: 1/2 bottle night before surgery and 1/2 bottle morning of surgery.   Please read over the following fact sheets that you were given: Pain Booklet, Coughing and Deep Breathing, Blood Transfusion Information, Total Joint Packet, MRSA Information and Surgical Site Infection Prevention

## 2012-05-25 NOTE — Progress Notes (Signed)
cardoac notes , test in epic 5/13 echo  ekg 5/13  Sleep study over 52yrs ago no cpap needed

## 2012-05-26 ENCOUNTER — Telehealth: Payer: Self-pay | Admitting: Family Medicine

## 2012-05-26 NOTE — Progress Notes (Signed)
  Subjective:    Patient ID: Tim Mccullough, male    DOB: Mar 01, 1951, 61 y.o.   MRN: [id number]  HPI Comments: It was recommended that patient stop lisinopril at the cardiology visit in 5/13.  I will ask to make sure this was done.      Review of Systems     Objective:   Physical Exam        Assessment & Plan:

## 2012-05-26 NOTE — Telephone Encounter (Signed)
It was recommended that patient stop lisinopril at the cardiology visit in 5/13.  It is still on his med list. Please call pt and make sure this was done.  Thanks.

## 2012-05-26 NOTE — Consult Note (Addendum)
Anesthesia Chart Review:  Patient is a 60 year old male scheduled for right TKR on 06/01/12 by Dr. Eulah Pont.  History includes right knee arthroscopy 10/15/11, former smoker, obesity with BMI 35, DM2, HLD, BPH, esophageal erosions, GERD, OSA, anemia, depression, HTN, small VSD (on cardiac MRI 2010 but not mentioned on 03/24/12 echo), ST, chronic CHF, non-ischemic CM.  PCP is Dr. Crawford Givens.  His Cardiologist is Dr. Kirke Corin who saw him on 03/15/12 for a pre-operative evaluation.  EKG then showed SR with non-specific T wave abnormality.  His recommendations included: "The patient has known history of nonischemic cardiomyopathy. He had a heart catheterization in 2010 that showed no obstructive coronary artery disease. He is not having any clear symptoms of angina but does report worsening dyspnea. Thus, I recommend an echocardiogram to evaluate his LV systolic function. If his ejection fraction is stable, he should be considered at low risk for cardiovascular complications.  Spironolactone should be held on the day of surgery."  He had an echo (see below) and was ultimately cleared.  Echo on 03/24/12 showed: - Left ventricle: The cavity size was normal. Wall thickness was normal. Systolic function was at the lower limits of normal. The estimated ejection fraction was in the range of 50% to 55%. Wall motion was normal; there were no regional wall motion abnormalities. Doppler parameters are consistent with abnormal left ventricular relaxation (grade 1 diastolic dysfunction). - Mitral valve: Valve area by pressure half-time: 2.37cm^2. - Left atrium: The atrium was mildly dilated. - Atrial septum: A septal defect cannot be excluded.  Cardiac MRI on 05/22/09 showed: Findings: The left ventricle was mild to moderately dilated. There was moderate global systolic dysfunction, EF 33%. There was mild to moderate left atrial enlargement. The right ventricle appeared normal in size with mildly decreased systolic  function. A small perimembranous VSD was identified by color doppler on echo. On this study, no large VSD was noted, cannot rule out a small VSD. The aortic valve was trileaflet without stenosis. On delayed enhancement imaging, there was no myocardial enhancement. No definite evidence for infiltrative disease or  prior infarction.  He had a negative CXR on 05/25/12.  Labs on 05/25/12 showed K 5.3, Cr 1.32, glucose 135, H/H 11.5/33.5, PLT 248.  T&S is done.  Cr is up from 0.85 in December 2012 and he has mild hyperkalemia.  Will repeat a BMET on arrival to ensure stability.  I routed his CMET results to Dr. Para March and Dr. Kirke Corin.  Shonna Chock, PA-C 05/26/12 1633  Addendum: 05/27/12 1230 I've communicated with both Dr. Para March and Dr. Kirke Corin.  According to Dr. Jari Sportsman last note, Mr. Toman was to stop his lisinopril as he is also on an ARB.  Patient thinks that he is still taking both.  Dr. Kirke Corin would also like him to temporarily hold his spironolactone.  I spoke with Efraim Kaufmann at [facility].  She is going to communicate these instructions (to stop lisinopril and hold spironolactone) to the patient.   She can clarify with Dr. Kirke Corin the desired duration he would like Mr. Colavito to hold Aldactone.

## 2012-05-27 ENCOUNTER — Telehealth: Payer: Self-pay

## 2012-05-27 NOTE — Telephone Encounter (Signed)
Rec'd t/c from [facility] in Anesthesia dept. Pt is scheduled for upcoming surg. She noted that Dr. Jari Sportsman last note advised pt to stop lisinopril but it looks as if pt is still taking this med.  Last BMP showed slightly worse kidney fxn and elevated K.  She wonders if I can call pt to confirm meds he is taking and advise him to stop lisinopril as instructed.   I called pt who says he did not stop lisinopril as instructed but will do so. Per Dr. Jari Sportsman note to Revonda Standard I also advised pt to hold spironolactone as well beginning today. Understanding verb.

## 2012-05-27 NOTE — Telephone Encounter (Signed)
Patient says that they called him yesterday and he has taken it out of his meds box now.  Apparently, it was not done prior to that.  The patient says he did not recall them telling him to stop it at the 5/13 OV.

## 2012-05-29 NOTE — Telephone Encounter (Signed)
Noted, they should be in touch with him about his BP meds in the meantime.  Thanks.

## 2012-05-31 MED ORDER — CEFAZOLIN SODIUM-DEXTROSE 2-3 GM-% IV SOLR
2.0000 g | INTRAVENOUS | Status: AC
  Administered 2012-06-01: 2 g via INTRAVENOUS
  Filled 2012-05-31: qty 50

## 2012-05-31 NOTE — H&P (Signed)
  [facility] [address]   [address] [place], [place] [address] [phone] A Division of [facility]  Loreta Ave, M.D.     Robert A. Thurston Hole, M.D.     Lunette Stands, M.D. Eulas Post, M.D.    Buford Dresser, M.D. Estell Harpin, M.D. Genene Churn. Barry Dienes, PA-C            Kirstin A. Shepperson, PA-C [place], OPA-C   RE: Marton, Malizia                                [id number]      DOB: [birth year] PROGRESS NOTE: 05-24-12 Chief complaint: Right knee pain.  History of present illness: 61 year-old white male with a history of end stage DJD, right knee, and chronic pain.  Returns.  States that knee symptoms are unchanged from previous visit.  He is wanting to proceed with total knee replacement as scheduled.  Received pre-op clearance. Current medications: Glipizide, Amlodipine, Spironolactone, Paroxetine, Lisinopril, Carvedilol, Losartan and Pravastatin.    Allergies: Question Metformin. Past medical/surgical history: Diabetes, hypertension, depression, hypercholesterolemia, left hernia repair, lumbar diskectomy, right shoulder rotator cuff repair, left total hip replacement and right knee arthroscopy. Review of systems: Patient currently denies lightheadedness, dizziness, fevers or chills, cardiac, pulmonary, GI or GU issues. Family history: Heart disease, hypertension, diabetes, stroke and cancer. Social history: Denies smoking, admits occasional alcohol use.  Patient is single.       EXAMINATION: Height: 5?10.  Weight: 245 pounds.  Blood pressure: 99/62.  Temperature: 98.2.  Pleasant white male, alert and oriented x 3 and in no acute distress.  No increase in respiratory effort.  Head is normocephalic, a traumatic.  PERRLA, EOMI.  Cervical spine unremarkable.  Lungs: CTA bilaterally.  No wheezes.  Heart: RRR.  Abdomen: Round and non-distended.  NBS x 4.  Soft and non-tender.  Right knee: Decreased range of motion.   Positive crepitus.  Positive effusion.  Joint line tender.  Calf non-tender.  Neurovascularly intact.  Skin warm and dry.       IMPRESSION: End stage DJD, right knee, and chronic pain.  Failed conservative treatment.  PLAN: We will proceed with total knee replacement as scheduled.  Surgical procedure, along with potential rehab/recovery time discussed.  All questions answered.    Loreta Ave, M.D.   Electronically verified by Loreta Ave, M.D. DFM(JMO):jjh D 05-24-12 T 05-25-12

## 2012-05-31 NOTE — Progress Notes (Signed)
Patient called and notified of time change. Pt instructed to arrive at 0700 instead of 0730. Pt verbalized understanding.

## 2012-05-31 NOTE — Progress Notes (Signed)
Notified patient of time change of surgery to be at short stay at 0700. Had to leave message on answering machine and instructed to call to confirm reception of information. Tim Mccullough

## 2012-06-01 ENCOUNTER — Encounter (HOSPITAL_COMMUNITY): Payer: Self-pay | Admitting: *Deleted

## 2012-06-01 ENCOUNTER — Inpatient Hospital Stay (HOSPITAL_COMMUNITY)
Admission: RE | Admit: 2012-06-01 | Discharge: 2012-06-03 | DRG: 470 | Disposition: A | Discharge status: Home / Self Care | Payer: 59 | Source: Ambulatory Visit | Attending: Orthopedic Surgery | Admitting: Orthopedic Surgery

## 2012-06-01 ENCOUNTER — Encounter (HOSPITAL_COMMUNITY): Payer: Self-pay | Admitting: Vascular Surgery

## 2012-06-01 ENCOUNTER — Inpatient Hospital Stay (HOSPITAL_COMMUNITY): Payer: 59

## 2012-06-01 ENCOUNTER — Ambulatory Visit (HOSPITAL_COMMUNITY): Payer: 59 | Admitting: Vascular Surgery

## 2012-06-01 ENCOUNTER — Encounter (HOSPITAL_COMMUNITY)
Admission: RE | Disposition: A | Discharge status: Home / Self Care | Payer: Self-pay | Source: Ambulatory Visit | Attending: Orthopedic Surgery

## 2012-06-01 DIAGNOSIS — Z471 Aftercare following joint replacement surgery: Secondary | ICD-10-CM

## 2012-06-01 DIAGNOSIS — F3289 Other specified depressive episodes: Secondary | ICD-10-CM | POA: Diagnosis present

## 2012-06-01 DIAGNOSIS — Z8249 Family history of ischemic heart disease and other diseases of the circulatory system: Secondary | ICD-10-CM

## 2012-06-01 DIAGNOSIS — I10 Essential (primary) hypertension: Secondary | ICD-10-CM | POA: Diagnosis present

## 2012-06-01 DIAGNOSIS — Z01812 Encounter for preprocedural laboratory examination: Secondary | ICD-10-CM

## 2012-06-01 DIAGNOSIS — D62 Acute posthemorrhagic anemia: Secondary | ICD-10-CM | POA: Diagnosis not present

## 2012-06-01 DIAGNOSIS — M1710 Unilateral primary osteoarthritis, unspecified knee: Principal | ICD-10-CM | POA: Diagnosis present

## 2012-06-01 DIAGNOSIS — Z833 Family history of diabetes mellitus: Secondary | ICD-10-CM

## 2012-06-01 DIAGNOSIS — Z823 Family history of stroke: Secondary | ICD-10-CM

## 2012-06-01 DIAGNOSIS — Z96649 Presence of unspecified artificial hip joint: Secondary | ICD-10-CM

## 2012-06-01 DIAGNOSIS — F329 Major depressive disorder, single episode, unspecified: Secondary | ICD-10-CM | POA: Diagnosis present

## 2012-06-01 DIAGNOSIS — E119 Type 2 diabetes mellitus without complications: Secondary | ICD-10-CM | POA: Diagnosis present

## 2012-06-01 HISTORY — PX: TOTAL KNEE ARTHROPLASTY: SHX125

## 2012-06-01 HISTORY — DX: Pneumonia, unspecified organism: J18.9

## 2012-06-01 LAB — BASIC METABOLIC PANEL
BUN: 31 mg/dL — ABNORMAL HIGH (ref 6–23)
Calcium: 9.3 mg/dL (ref 8.4–10.5)
Creatinine, Ser: 1.17 mg/dL (ref 0.50–1.35)
GFR calc non Af Amer: 66 mL/min — ABNORMAL LOW (ref >90)
Glucose, Bld: 163 mg/dL — ABNORMAL HIGH (ref 70–99)
Sodium: 138 mEq/L (ref 135–145)

## 2012-06-01 LAB — GLUCOSE, CAPILLARY
Glucose-Capillary: 136 mg/dL — ABNORMAL HIGH (ref 70–99)
Glucose-Capillary: 136 mg/dL — ABNORMAL HIGH (ref 70–99)

## 2012-06-01 SURGERY — ARTHROPLASTY, KNEE, TOTAL
Anesthesia: General | Site: Knee | Laterality: Right | Wound class: Clean

## 2012-06-01 MED ORDER — ACETAMINOPHEN 325 MG PO TABS: 650.0000 mg | ORAL_TABLET | Freq: Four times a day (QID) | ORAL | Status: DC | PRN

## 2012-06-01 MED ORDER — PNEUMOCOCCAL VAC POLYVALENT 25 MCG/0.5ML IJ INJ
0.5000 mL | INJECTION | INTRAMUSCULAR | Status: AC
  Filled 2012-06-01: qty 0.5

## 2012-06-01 MED ORDER — SODIUM CHLORIDE 0.9 % IR SOLN
Status: DC | PRN
  Administered 2012-06-01: 1000 mL
  Administered 2012-06-01: 3000 mL

## 2012-06-01 MED ORDER — ENOXAPARIN SODIUM 30 MG/0.3ML ~~LOC~~ SOLN
30.0000 mg | Freq: Two times a day (BID) | SUBCUTANEOUS | Status: DC
  Administered 2012-06-02 – 2012-06-03 (×3): 30 mg via SUBCUTANEOUS
  Filled 2012-06-01 (×5): qty 0.3

## 2012-06-01 MED ORDER — SENNOSIDES-DOCUSATE SODIUM 8.6-50 MG PO TABS: 1.0000 | ORAL_TABLET | Freq: Every evening | ORAL | Status: DC | PRN

## 2012-06-01 MED ORDER — MORPHINE SULFATE 4 MG/ML IJ SOLN
4.0000 mg | INTRAMUSCULAR | Status: DC
  Filled 2012-06-01: qty 1

## 2012-06-01 MED ORDER — GLIPIZIDE ER 5 MG PO TB24
5.0000 mg | ORAL_TABLET | Freq: Every day | ORAL | Status: DC
  Administered 2012-06-01 – 2012-06-03 (×3): 5 mg via ORAL
  Filled 2012-06-01 (×3): qty 1

## 2012-06-01 MED ORDER — METOCLOPRAMIDE HCL 10 MG PO TABS: 5.0000 mg | ORAL_TABLET | Freq: Three times a day (TID) | ORAL | Status: DC | PRN

## 2012-06-01 MED ORDER — KETOROLAC TROMETHAMINE 30 MG/ML IJ SOLN
15.0000 mg | Freq: Once | INTRAMUSCULAR | Status: AC | PRN
  Administered 2012-06-01: 30 mg via INTRAVENOUS

## 2012-06-01 MED ORDER — AMLODIPINE BESYLATE 5 MG PO TABS
5.0000 mg | ORAL_TABLET | Freq: Every day | ORAL | Status: DC
  Filled 2012-06-01: qty 1

## 2012-06-01 MED ORDER — HYDROMORPHONE 0.3 MG/ML IV SOLN
INTRAVENOUS | Status: AC
  Filled 2012-06-01: qty 25

## 2012-06-01 MED ORDER — KETOROLAC TROMETHAMINE 30 MG/ML IJ SOLN
INTRAMUSCULAR | Status: AC
  Administered 2012-06-01: 30 mg via INTRAVENOUS
  Filled 2012-06-01: qty 1

## 2012-06-01 MED ORDER — ONDANSETRON HCL 4 MG/2ML IJ SOLN
4.0000 mg | Freq: Four times a day (QID) | INTRAMUSCULAR | Status: DC | PRN
  Administered 2012-06-02: 4 mg via INTRAVENOUS

## 2012-06-01 MED ORDER — SIMVASTATIN 20 MG PO TABS
20.0000 mg | ORAL_TABLET | Freq: Every day | ORAL | Status: DC
  Administered 2012-06-01 – 2012-06-02 (×2): 20 mg via ORAL
  Filled 2012-06-01 (×3): qty 1

## 2012-06-01 MED ORDER — LACTATED RINGERS IV SOLN
INTRAVENOUS | Status: DC
  Administered 2012-06-01: 09:00:00 via INTRAVENOUS

## 2012-06-01 MED ORDER — METHOCARBAMOL 500 MG PO TABS
500.0000 mg | ORAL_TABLET | Freq: Four times a day (QID) | ORAL | Status: DC | PRN
  Administered 2012-06-02 – 2012-06-03 (×2): 500 mg via ORAL
  Filled 2012-06-01 (×3): qty 1

## 2012-06-01 MED ORDER — FENTANYL CITRATE 0.05 MG/ML IJ SOLN
INTRAMUSCULAR | Status: DC | PRN
  Administered 2012-06-01: 50 ug via INTRAVENOUS
  Administered 2012-06-01 (×2): 25 ug via INTRAVENOUS
  Administered 2012-06-01: 50 ug via INTRAVENOUS

## 2012-06-01 MED ORDER — BISACODYL 10 MG RE SUPP: 10.0000 mg | Freq: Every day | RECTAL | Status: DC | PRN

## 2012-06-01 MED ORDER — ACETAMINOPHEN 650 MG RE SUPP: 650.0000 mg | Freq: Four times a day (QID) | RECTAL | Status: DC | PRN

## 2012-06-01 MED ORDER — POTASSIUM CHLORIDE IN NACL 20-0.9 MEQ/L-% IV SOLN
INTRAVENOUS | Status: DC
  Administered 2012-06-01 – 2012-06-02 (×2): via INTRAVENOUS
  Filled 2012-06-01 (×6): qty 1000

## 2012-06-01 MED ORDER — SODIUM CHLORIDE 0.9 % IJ SOLN: 9.0000 mL | INTRAMUSCULAR | Status: DC | PRN

## 2012-06-01 MED ORDER — ALUM & MAG HYDROXIDE-SIMETH 200-200-20 MG/5ML PO SUSP: 30.0000 mL | ORAL | Status: DC | PRN

## 2012-06-01 MED ORDER — METOCLOPRAMIDE HCL 5 MG/ML IJ SOLN: 5.0000 mg | Freq: Three times a day (TID) | INTRAMUSCULAR | Status: DC | PRN

## 2012-06-01 MED ORDER — WARFARIN - PHARMACIST DOSING INPATIENT: Freq: Every day | Status: DC

## 2012-06-01 MED ORDER — CEFAZOLIN SODIUM 1-5 GM-% IV SOLN
1.0000 g | Freq: Three times a day (TID) | INTRAVENOUS | Status: AC
  Administered 2012-06-01 – 2012-06-02 (×3): 1 g via INTRAVENOUS
  Filled 2012-06-01 (×4): qty 50

## 2012-06-01 MED ORDER — MEPERIDINE HCL 25 MG/ML IJ SOLN: 6.2500 mg | INTRAMUSCULAR | Status: DC | PRN

## 2012-06-01 MED ORDER — LACTATED RINGERS IV SOLN
INTRAVENOUS | Status: DC | PRN
  Administered 2012-06-01: 09:00:00 via INTRAVENOUS

## 2012-06-01 MED ORDER — ONDANSETRON HCL 4 MG/2ML IJ SOLN
4.0000 mg | Freq: Four times a day (QID) | INTRAMUSCULAR | Status: DC | PRN
  Filled 2012-06-01: qty 2

## 2012-06-01 MED ORDER — LIDOCAINE HCL (CARDIAC) 20 MG/ML IV SOLN
INTRAVENOUS | Status: DC | PRN
  Administered 2012-06-01: 50 mg via INTRAVENOUS

## 2012-06-01 MED ORDER — SPIRONOLACTONE 25 MG PO TABS
25.0000 mg | ORAL_TABLET | Freq: Every day | ORAL | Status: DC
  Administered 2012-06-01 – 2012-06-02 (×2): 25 mg via ORAL
  Filled 2012-06-01 (×3): qty 1

## 2012-06-01 MED ORDER — PANTOPRAZOLE SODIUM 40 MG PO TBEC
40.0000 mg | DELAYED_RELEASE_TABLET | Freq: Every day | ORAL | Status: DC
  Administered 2012-06-02 – 2012-06-03 (×2): 40 mg via ORAL
  Filled 2012-06-01 (×2): qty 1

## 2012-06-01 MED ORDER — LOSARTAN POTASSIUM 50 MG PO TABS
100.0000 mg | ORAL_TABLET | Freq: Every morning | ORAL | Status: DC
  Administered 2012-06-01 – 2012-06-02 (×2): 100 mg via ORAL
  Filled 2012-06-01 (×3): qty 2

## 2012-06-01 MED ORDER — METHOCARBAMOL 100 MG/ML IJ SOLN
500.0000 mg | INTRAVENOUS | Status: DC
  Administered 2012-06-01: 500 mg via INTRAVENOUS
  Filled 2012-06-01: qty 5

## 2012-06-01 MED ORDER — FENTANYL CITRATE 0.05 MG/ML IJ SOLN
50.0000 ug | INTRAMUSCULAR | Status: DC | PRN
  Administered 2012-06-01: 100 ug via INTRAVENOUS

## 2012-06-01 MED ORDER — INSULIN ASPART 100 UNIT/ML ~~LOC~~ SOLN
0.0000 [IU] | Freq: Three times a day (TID) | SUBCUTANEOUS | Status: DC
  Administered 2012-06-01 – 2012-06-02 (×2): 2 [IU] via SUBCUTANEOUS
  Administered 2012-06-03: 3 [IU] via SUBCUTANEOUS
  Administered 2012-06-03: 2 [IU] via SUBCUTANEOUS

## 2012-06-01 MED ORDER — DOXAZOSIN MESYLATE 1 MG PO TABS
1.0000 mg | ORAL_TABLET | Freq: Every day | ORAL | Status: DC
  Administered 2012-06-01 – 2012-06-02 (×2): 1 mg via ORAL
  Filled 2012-06-01 (×3): qty 1

## 2012-06-01 MED ORDER — WARFARIN SODIUM 7.5 MG PO TABS
7.5000 mg | ORAL_TABLET | Freq: Once | ORAL | Status: AC
  Administered 2012-06-01: 7.5 mg via ORAL
  Filled 2012-06-01 (×2): qty 1

## 2012-06-01 MED ORDER — BUPIVACAINE HCL (PF) 0.25 % IJ SOLN
INTRAMUSCULAR | Status: DC | PRN
  Administered 2012-06-01: 10 mL via INTRA_ARTICULAR

## 2012-06-01 MED ORDER — COUMADIN BOOK
Freq: Once | Status: AC
  Administered 2012-06-01: 16:00:00
  Filled 2012-06-01: qty 1

## 2012-06-01 MED ORDER — HYDROMORPHONE HCL PF 1 MG/ML IJ SOLN
INTRAMUSCULAR | Status: AC
Start: 2012-06-01 — End: 2012-06-01
  Administered 2012-06-01: 0.5 mg via INTRAVENOUS
  Filled 2012-06-01: qty 1

## 2012-06-01 MED ORDER — BUPIVACAINE HCL (PF) 0.25 % IJ SOLN
INTRAMUSCULAR | Status: AC
  Filled 2012-06-01: qty 30

## 2012-06-01 MED ORDER — DIPHENHYDRAMINE HCL 12.5 MG/5ML PO ELIX: 12.5000 mg | ORAL_SOLUTION | Freq: Four times a day (QID) | ORAL | Status: DC | PRN

## 2012-06-01 MED ORDER — METHOCARBAMOL 100 MG/ML IJ SOLN
500.0000 mg | Freq: Four times a day (QID) | INTRAVENOUS | Status: DC | PRN
  Filled 2012-06-01: qty 5

## 2012-06-01 MED ORDER — DOCUSATE SODIUM 100 MG PO CAPS
100.0000 mg | ORAL_CAPSULE | Freq: Two times a day (BID) | ORAL | Status: DC
  Administered 2012-06-01 – 2012-06-03 (×5): 100 mg via ORAL
  Filled 2012-06-01 (×5): qty 1

## 2012-06-01 MED ORDER — FENTANYL CITRATE 0.05 MG/ML IJ SOLN
INTRAMUSCULAR | Status: AC
  Filled 2012-06-01: qty 2

## 2012-06-01 MED ORDER — NALOXONE HCL 0.4 MG/ML IJ SOLN: 0.4000 mg | INTRAMUSCULAR | Status: DC | PRN

## 2012-06-01 MED ORDER — WARFARIN VIDEO: Freq: Once | Status: DC

## 2012-06-01 MED ORDER — PAROXETINE HCL 20 MG PO TABS
20.0000 mg | ORAL_TABLET | Freq: Every day | ORAL | Status: DC
  Administered 2012-06-01 – 2012-06-03 (×3): 20 mg via ORAL
  Filled 2012-06-01 (×3): qty 1

## 2012-06-01 MED ORDER — PROPOFOL 10 MG/ML IV EMUL
INTRAVENOUS | Status: DC | PRN
  Administered 2012-06-01: 200 mg via INTRAVENOUS

## 2012-06-01 MED ORDER — SODIUM CHLORIDE 0.9 % IV SOLN
INTRAVENOUS | Status: DC | PRN
  Administered 2012-06-01: 10:00:00 via INTRAVENOUS

## 2012-06-01 MED ORDER — MIDAZOLAM HCL 2 MG/2ML IJ SOLN
1.0000 mg | INTRAMUSCULAR | Status: DC | PRN
  Administered 2012-06-01: 2 mg via INTRAVENOUS

## 2012-06-01 MED ORDER — LOSARTAN POTASSIUM 50 MG PO TABS: 50.0000 mg | ORAL_TABLET | Freq: Two times a day (BID) | ORAL | Status: DC

## 2012-06-01 MED ORDER — LOSARTAN POTASSIUM 50 MG PO TABS
50.0000 mg | ORAL_TABLET | Freq: Every day | ORAL | Status: DC
  Administered 2012-06-01: 50 mg via ORAL
  Filled 2012-06-01 (×3): qty 1

## 2012-06-01 MED ORDER — MORPHINE SULFATE 4 MG/ML IJ SOLN
INTRAMUSCULAR | Status: DC | PRN
  Administered 2012-06-01: 4 mg via INTRAMUSCULAR

## 2012-06-01 MED ORDER — MORPHINE SULFATE (PF) 1 MG/ML IV SOLN
INTRAVENOUS | Status: DC
  Administered 2012-06-01: 13:00:00 via INTRAVENOUS
  Administered 2012-06-01: 18 mg via INTRAVENOUS
  Administered 2012-06-01: 19:00:00 via INTRAVENOUS
  Administered 2012-06-02: 6 mg via INTRAVENOUS
  Administered 2012-06-02: 07:00:00 via INTRAVENOUS
  Administered 2012-06-02: 4.5 mg via INTRAVENOUS
  Filled 2012-06-01 (×2): qty 25

## 2012-06-01 MED ORDER — HYDROMORPHONE HCL PF 1 MG/ML IJ SOLN
0.2500 mg | INTRAMUSCULAR | Status: DC | PRN
  Administered 2012-06-01 (×2): 0.5 mg via INTRAVENOUS

## 2012-06-01 MED ORDER — BUPIVACAINE-EPINEPHRINE PF 0.5-1:200000 % IJ SOLN
INTRAMUSCULAR | Status: DC | PRN
  Administered 2012-06-01: 30 mL

## 2012-06-01 MED ORDER — ONDANSETRON HCL 4 MG PO TABS: 4.0000 mg | ORAL_TABLET | Freq: Four times a day (QID) | ORAL | Status: DC | PRN

## 2012-06-01 MED ORDER — MORPHINE SULFATE (PF) 1 MG/ML IV SOLN
INTRAVENOUS | Status: AC
  Filled 2012-06-01: qty 25

## 2012-06-01 MED ORDER — PHENOL 1.4 % MT LIQD: 1.0000 | OROMUCOSAL | Status: DC | PRN

## 2012-06-01 MED ORDER — AMLODIPINE BESYLATE 5 MG PO TABS
5.0000 mg | ORAL_TABLET | Freq: Every day | ORAL | Status: DC
Start: 2012-06-02 — End: 2012-06-03
  Administered 2012-06-02: 5 mg via ORAL
  Filled 2012-06-01 (×2): qty 1

## 2012-06-01 MED ORDER — DIPHENHYDRAMINE HCL 50 MG/ML IJ SOLN: 12.5000 mg | Freq: Four times a day (QID) | INTRAMUSCULAR | Status: DC | PRN

## 2012-06-01 MED ORDER — MIDAZOLAM HCL 2 MG/2ML IJ SOLN
INTRAMUSCULAR | Status: AC
  Filled 2012-06-01: qty 2

## 2012-06-01 MED ORDER — MENTHOL 3 MG MT LOZG: 1.0000 | LOZENGE | OROMUCOSAL | Status: DC | PRN

## 2012-06-01 MED ORDER — CARVEDILOL 25 MG PO TABS
25.0000 mg | ORAL_TABLET | Freq: Two times a day (BID) | ORAL | Status: DC
  Administered 2012-06-01 – 2012-06-03 (×3): 25 mg via ORAL
  Filled 2012-06-01 (×6): qty 1

## 2012-06-01 SURGICAL SUPPLY — 64 items
BANDAGE ESMARK 6X9 LF (GAUZE/BANDAGES/DRESSINGS) ×1 IMPLANT
BLADE SAG 18X100X1.27 (BLADE) ×4 IMPLANT
BNDG CMPR 9X6 STRL LF SNTH (GAUZE/BANDAGES/DRESSINGS) ×1
BNDG ESMARK 6X9 LF (GAUZE/BANDAGES/DRESSINGS) ×2
BOOTCOVER CLEANROOM LRG (PROTECTIVE WEAR) ×4 IMPLANT
BOWL SMART MIX CTS (DISPOSABLE) ×2 IMPLANT
CEMENT BONE SIMPLEX SPEEDSET (Cement) ×3 IMPLANT
CLOTH BEACON ORANGE TIMEOUT ST (SAFETY) ×2 IMPLANT
COVER BACK TABLE 24X17X13 BIG (DRAPES) ×2 IMPLANT
COVER SURGICAL LIGHT HANDLE (MISCELLANEOUS) ×2 IMPLANT
CUFF TOURNIQUET SINGLE 34IN LL (TOURNIQUET CUFF) ×2 IMPLANT
DRAPE EXTREMITY T 121X128X90 (DRAPE) ×2 IMPLANT
DRAPE PROXIMA HALF (DRAPES) ×2 IMPLANT
DRAPE U-SHAPE 47X51 STRL (DRAPES) ×2 IMPLANT
DRSG PAD ABDOMINAL 8X10 ST (GAUZE/BANDAGES/DRESSINGS) ×2 IMPLANT
DURAPREP 26ML APPLICATOR (WOUND CARE) ×2 IMPLANT
ELECT CAUTERY BLADE 6.4 (BLADE) ×2 IMPLANT
ELECT REM PT RETURN 9FT ADLT (ELECTROSURGICAL) ×2
ELECTRODE REM PT RTRN 9FT ADLT (ELECTROSURGICAL) ×1 IMPLANT
EVACUATOR 1/8 PVC DRAIN (DRAIN) ×2 IMPLANT
FACESHIELD LNG OPTICON STERILE (SAFETY) ×2 IMPLANT
GAUZE XEROFORM 5X9 LF (GAUZE/BANDAGES/DRESSINGS) ×2 IMPLANT
GLOVE BIOGEL PI IND STRL 7.5 (GLOVE) IMPLANT
GLOVE BIOGEL PI IND STRL 8 (GLOVE) ×1 IMPLANT
GLOVE BIOGEL PI INDICATOR 7.5 (GLOVE) ×1
GLOVE BIOGEL PI INDICATOR 8 (GLOVE) ×1
GLOVE ORTHO TXT STRL SZ7.5 (GLOVE) ×2 IMPLANT
GLOVE SS BIOGEL STRL SZ 7.5 (GLOVE) IMPLANT
GLOVE SUPERSENSE BIOGEL SZ 7.5 (GLOVE) ×1
GOWN PREVENTION PLUS XLARGE (GOWN DISPOSABLE) ×5 IMPLANT
GOWN STRL NON-REIN LRG LVL3 (GOWN DISPOSABLE) ×4 IMPLANT
GOWN STRL REIN 2XL XLG LVL4 (GOWN DISPOSABLE) ×2 IMPLANT
HANDPIECE INTERPULSE COAX TIP (DISPOSABLE) ×2
IMMOBILIZER KNEE 22 UNIV (SOFTGOODS) ×2 IMPLANT
IMMOBILIZER KNEE 24 THIGH 36 (MISCELLANEOUS) IMPLANT
IMMOBILIZER KNEE 24 UNIV (MISCELLANEOUS)
KIT BASIN OR (CUSTOM PROCEDURE TRAY) ×2 IMPLANT
KIT ROOM TURNOVER OR (KITS) ×2 IMPLANT
MANIFOLD NEPTUNE II (INSTRUMENTS) ×2 IMPLANT
NS IRRIG 1000ML POUR BTL (IV SOLUTION) ×2 IMPLANT
PACK TOTAL JOINT (CUSTOM PROCEDURE TRAY) ×2 IMPLANT
PAD ARMBOARD 7.5X6 YLW CONV (MISCELLANEOUS) ×4 IMPLANT
PAD CAST 4YDX4 CTTN HI CHSV (CAST SUPPLIES) ×1 IMPLANT
PADDING CAST ABS 4INX4YD NS (CAST SUPPLIES) ×1
PADDING CAST ABS 6INX4YD NS (CAST SUPPLIES) ×1
PADDING CAST ABS COTTON 4X4 ST (CAST SUPPLIES) IMPLANT
PADDING CAST ABS COTTON 6X4 NS (CAST SUPPLIES) IMPLANT
PADDING CAST COTTON 4X4 STRL (CAST SUPPLIES) ×2
PADDING CAST COTTON 6X4 STRL (CAST SUPPLIES) ×2 IMPLANT
RUBBERBAND STERILE (MISCELLANEOUS) ×2 IMPLANT
SET HNDPC FAN SPRY TIP SCT (DISPOSABLE) ×1 IMPLANT
SPONGE GAUZE 4X4 12PLY (GAUZE/BANDAGES/DRESSINGS) ×2 IMPLANT
STAPLER VISISTAT 35W (STAPLE) ×2 IMPLANT
SUCTION FRAZIER TIP 10 FR DISP (SUCTIONS) ×1 IMPLANT
SUT VIC AB 1 CTX 36 (SUTURE) ×4
SUT VIC AB 1 CTX36XBRD ANBCTR (SUTURE) ×2 IMPLANT
SUT VIC AB 2-0 CT1 27 (SUTURE) ×4
SUT VIC AB 2-0 CT1 TAPERPNT 27 (SUTURE) ×2 IMPLANT
SYR 30ML LL (SYRINGE) ×2 IMPLANT
SYR 30ML SLIP (SYRINGE) ×2 IMPLANT
TOWEL OR 17X24 6PK STRL BLUE (TOWEL DISPOSABLE) ×2 IMPLANT
TOWEL OR 17X26 10 PK STRL BLUE (TOWEL DISPOSABLE) ×2 IMPLANT
TRAY FOLEY CATH 14FR (SET/KITS/TRAYS/PACK) ×2 IMPLANT
WATER STERILE IRR 1000ML POUR (IV SOLUTION) ×6 IMPLANT

## 2012-06-01 NOTE — Anesthesia Preprocedure Evaluation (Signed)
Anesthesia Evaluation  Patient identified by MRN, date of birth, ID band Patient awake    Reviewed: Allergy & Precautions, H&P , NPO status , Patient's Chart, lab work & pertinent test results, reviewed documented beta blocker date and time   Airway Mallampati: II TM Distance: >3 FB Neck ROM: full    Dental No notable dental hx. (+) Teeth Intact   Pulmonary sleep apnea ,  breath sounds clear to auscultation  Pulmonary exam normal       Cardiovascular hypertension, On Medications and On Home Beta Blockers Rhythm:regular Rate:Normal     Neuro/Psych negative neurological ROS  negative psych ROS   GI/Hepatic negative GI ROS, Neg liver ROS, PUD, Medicated and Controlled,  Endo/Other  Well Controlled, Type 2  Renal/GU negative Renal ROS  negative genitourinary   Musculoskeletal   Abdominal   Peds  Hematology negative hematology ROS (+) REFUSES BLOOD PRODUCTS,   Anesthesia Other Findings   Reproductive/Obstetrics negative OB ROS                           Anesthesia Physical  Anesthesia Plan  ASA: III  Anesthesia Plan: General   Post-op Pain Management:    Induction: Intravenous  Airway Management Planned: LMA  Additional Equipment:   Intra-op Plan:   Post-operative Plan: Extubation in OR  Informed Consent: I have reviewed the patients History and Physical, chart, labs and discussed the procedure including the risks, benefits and alternatives for the proposed anesthesia with the patient or authorized representative who has indicated his/her understanding and acceptance.     Plan Discussed with: CRNA  Anesthesia Plan Comments:         Anesthesia Quick Evaluation

## 2012-06-01 NOTE — Progress Notes (Signed)
IV STARTED BY OR NURSE TONYA .

## 2012-06-01 NOTE — Progress Notes (Signed)
Orthopedic Tech Progress Note Patient Details:  Tim Mccullough [facility] 1951-11-05 [id number]  CPM Right Knee CPM Right Knee: On Right Knee Flexion (Degrees): 60  Right Knee Extension (Degrees): 0  Additional Comments: trapeze bar   Cammer, Mickie Bail 06/01/2012, 1:38 PM

## 2012-06-01 NOTE — Progress Notes (Addendum)
Versed, fentanylGiven 0915   Am by  Archie Patten

## 2012-06-01 NOTE — Interval H&P Note (Signed)
History and Physical Interval Note:  06/01/2012 8:18 AM  Tim Mccullough  has presented today for surgery, with the diagnosis of DJD RIGHT KNEE  The various methods of treatment have been discussed with the patient and family. After consideration of risks, benefits and other options for treatment, the patient has consented to  Procedure(s) (LRB): TOTAL KNEE ARTHROPLASTY (Right) as a surgical intervention .  The patient's history has been reviewed, patient examined, no change in status, stable for surgery.  I have reviewed the patient's chart and labs.  Questions were answered to the patient's satisfaction.     Amjad Fikes F

## 2012-06-01 NOTE — Progress Notes (Signed)
UR COMPLETED  

## 2012-06-01 NOTE — Op Note (Signed)
NAMESAYGE, SALVATO NO.:  [id number]  MEDICAL RECORD NO.:  [id number]  LOCATION:  5N03C                        FACILITY:  [facility]  PHYSICIAN:  Loreta Ave, M.D. DATE OF BIRTH:  01/03/1951  DATE OF PROCEDURE:  06/01/2012 DATE OF DISCHARGE:                              OPERATIVE REPORT   PREOPERATIVE DIAGNOSES:  Right knee end-stage degenerative arthritis, valgus alignment.  POSTOPERATIVE DIAGNOSES:  Right knee end-stage degenerative arthritis, valgus alignment, with a moderately fixed valgus contracture.  PROCEDURE:  Modified minimally invasive right total knee replacement. Soft tissue balancing.  Stryker triathlon prosthesis.  Cemented pegged posterior stabilized #5 femoral component.  Cemented 5 tibial component with a 9-mm polyethylene insert.  Cemented resurfacing 35 mm patellar component.  SURGEON:  Loreta Ave, M.D.  ASSISTANT:  Genene Churn. Denton Meek.  ANESTHESIA:  General.  BLOOD LOSS:  Minimal.  SPECIMENS:  None.  CULTURES:  None.  COMPLICATION:  None.  DRESSINGS:  Soft compressive with a knee immobilizer.  DRAINS:  Hemovac x1.  TOURNIQUET TIME:  45 minutes.  PROCEDURE:  The patient was brought to the operating room and placed on the operating table in supine position.  After adequate anesthesia had been obtained, knee examined.  Fixed valgus about 10 degrees.  Still fairly good extension and flexion better than 90 degrees.  Tourniquet applied.  Prepped and draped in usual sterile fashion.  Exsanguinated with elevation, Esmarch.  Tourniquet inflated to 350 mmHg.  Straight incision above the patella down to tibial tubercle.  Medial arthrotomy, vastus splitting, preserving quad tendon.  Medial capsular release for exposure.  Knee exposed.  Grade 4 changes, most marked laterally.  I was able free of soft tissue laterally, I did not have to do a more formal release of the iliotibial band or capsule.  Remnants of menisci, cruciate  ligaments removed.  I looked very hard for loose bodies throughout the knee, none others were seen.  Distal femur exposed. Intramedullary guide placed.  Distal cut 8 mm 5 degrees of valgus. Using epicondylar axis, the femur was sized, cut, and fitted for a #5 component.  Proximal tibia exposed.  Extramedullary guide.  3 degree posterior slope cut.  Size #5 component.  Nicely balanced in flexion and extension.  Patella exposed, posterior 10 mm removed.  Drilled, sized, and fitted for a 35-mm component.  Debris cleared throughout the knee in flexion and extension.  Again a good search for any loose bodies that evident on x-ray, but were not apparent in the knee.  Trials put in place of the knee.  #5 on the femur, #5 on the tibia, 9 mm insert and a 35 patella.  With this construct, nice biomechanical axis.  Nicely balanced in flexion and extension.  Good stability, good patellar tracking.  Tibia was marked for rotation and reamed.  All trials removed.  Copious irrigation with a pulse irrigating device.  Cement prepared, placed on all components and firmly seated.  Polyethylene attached to tibia, knee reduced.  Patella clamped.  Once cement hardened, the wound was irrigated again.  Hemovac was placed and brought out through a separate stab wound.  Arthrotomy closed with #1 Vicryl. Skin and subcutaneous  tissue with Vicryl and staples.  Sterile compressive dressing applied.  Tourniquet deflated and removed.  Knee immobilizer applied.  Anesthesia reversed.  Brought to the recovery room.  Tolerated surgery well.  No complications.     Loreta Ave, M.D.     DFM/MEDQ  D:  06/01/2012  T:  06/01/2012  Job:  [id number]

## 2012-06-01 NOTE — Brief Op Note (Signed)
06/01/2012  1:31 PM  PATIENT:  Tim Mccullough  61 y.o. male  PRE-OPERATIVE DIAGNOSIS:  DJD RIGHT KNEE  POST-OPERATIVE DIAGNOSIS:  DJD RIGHT KNEE  PROCEDURE:  Procedure(s) (LRB): TOTAL KNEE ARTHROPLASTY (Right)  SURGEON:  Surgeon(s) and Role:    * Loreta Ave, MD - Primary    * Nilda Simmer, MD - Assisting  PHYSICIAN ASSISTANT: Zonia Kief M   ANESTHESIA:   regional and general  EBL:  Total I/O In: 1225 [P.O.:125; I.V.:1100] Out: 160 [Urine:160]  BLOOD ADMINISTERED:none   SPECIMEN:  No Specimen  DISPOSITION OF SPECIMEN:  N/A  COUNTS:  YES  TOURNIQUET:   Total Tourniquet Time Documented: Thigh (Right) - 70 minutes   PATIENT DISPOSITION:  PACU - hemodynamically stable.

## 2012-06-01 NOTE — Transfer of Care (Signed)
Immediate Anesthesia Transfer of Care Note  Patient: Tim Mccullough  Procedure(s) Performed: Procedure(s) (LRB): TOTAL KNEE ARTHROPLASTY (Right)  Patient Location: PACU  Anesthesia Type: GA combined with regional for post-op pain  Level of Consciousness: awake and alert   Airway & Oxygen Therapy: Patient Spontanous Breathing and Patient connected to nasal cannula oxygen  Post-op Assessment: Report given to PACU RN  Post vital signs: Reviewed and stable  Complications: No apparent anesthesia complications

## 2012-06-01 NOTE — Preoperative (Signed)
Beta Blockers   Reason not to administer Beta Blockers:Not Applicable 

## 2012-06-01 NOTE — Plan of Care (Signed)
Problem: Consults Goal: Diagnosis- Total Joint Replacement Primary Total Knee right     

## 2012-06-01 NOTE — Anesthesia Procedure Notes (Signed)
Anesthesia Regional Block:  Femoral nerve block  Pre-Anesthetic Checklist: ,, timeout performed, Correct Patient, Correct Site, Correct Laterality, Correct Procedure, Correct Position, site marked, Risks and benefits discussed, at surgeon's request and post-op pain management  Laterality: Right and Upper  Prep: Betadine       Needles:  Injection technique: Single-shot  Needle Type: Stimulator Needle - 80      Needle Gauge: 22 and 22 G  Needle insertion depth: 6 cm   Additional Needles:  Procedures: nerve stimulator Femoral nerve block  Nerve Stimulator or Paresthesia:  Response: Twitch elicited, 0.8 mA,   Additional Responses:   Narrative:  Start time: 06/01/2012 9:05 AM End time: 06/01/2012 9:24 AM Injection made incrementally with aspirations every 5 mL.  Performed by: Personally  Anesthesiologist: Alma Friendly, MD  Additional Notes: BP cuff, EKG monitors applied. Sedation begun. Femoral artery palpated for location of nerve. After nerve location anesthetic injected incrementally, slowly , and after neg aspirations. Tolerated well.  Femoral nerve block

## 2012-06-01 NOTE — Anesthesia Postprocedure Evaluation (Signed)
  Anesthesia Post-op Note  Patient: Tim Mccullough  Procedure(s) Performed: Procedure(s) (LRB): TOTAL KNEE ARTHROPLASTY (Right)  Patient Location: PACU  Anesthesia Type: GA combined with regional for post-op pain  Level of Consciousness: awake  Airway and Oxygen Therapy: Patient Spontanous Breathing  Post-op Pain: mild  Post-op Assessment: Post-op Vital signs reviewed  Post-op Vital Signs: stable  Complications: No apparent anesthesia complications

## 2012-06-01 NOTE — Progress Notes (Signed)
ANTICOAGULATION CONSULT NOTE - Initial Consult  Pharmacy Consult for warfarin Indication: VTE prophylaxis  Allergies  Allergen Reactions  . Metformin     REACTION: intolerant, h/o elevated Cr 2011    Patient Measurements:      Vital Signs: Temp: 97.5 F (36.4 C) (07/24 1300) Temp src: Oral (07/24 0758) BP: 114/71 mmHg (07/24 1300) Pulse Rate: 92  (07/24 1300)  Labs:  Basename 06/01/12 0744  HGB --  HCT --  PLT --  APTT --  LABPROT --  INR --  HEPARINUNFRC --  CREATININE 1.17  CKTOTAL --  CKMB --  TROPONINI --    The CrCl is unknown because both a height and weight (above a minimum accepted value) are required for this calculation.   Medical History: Past Medical History  Diagnosis Date  . Diabetes mellitus 11/2006    Type II  . Hyperlipidemia 04/1998  . Hypertension 1995  . BPH (benign prostatic hypertrophy) 2004  . Sinus tachycardia     Pt says he has been told his heart rate is high since he was in high school.   . Holter monitor, abnormal 7/10    Frequent PAC's and PVC's  Average HR 96  (but he had started Toprol XL).  HR range 71-130  . Obesity   . Hip pain   . Cardiomyopathy     ? tachycardia-mediated  . Echocardiogram abnormal 7/10    Mild LVH with a mildly dilated LV.  EF was difficult to assess given poor acoustic windows but appeared moderately decreased.  Mild MR.  Probable small perimembranous VSD, mild LAE  . History of MRI 7/10    Cardiac MRI was done to followup difficult echo.  This showed mild to moderately dilated LV, mild to moderate LAE, EF  33% with global hypokinesis, normal RV size with mild systolic dysfunction.  There was no large VSD evident.  There was no myocardial delayed enhancement so no evidence for infiltrative disease or infarction.  LHC (7/10) showed only luminal irreg. in the coronaries and EF 35%  . Abnormal echocardiogram 2/11    Repeated after med mgmt of cardiomyopathy showed EF 40-45%, mildly dilated LV, mild LV  hypertrophy, anterolateral and apical hypokinesis, mild diastolic dysfunction.  . VSD (ventricular septal defect)     Probable small perimembranous  . Diverticulosis   . Depression   . Esophageal erosions   . OSA (obstructive sleep apnea)     Not using CPAP  . GERD (gastroesophageal reflux disease)   . Cancer     melanoma back  . Arthritis   . Anemia     hx    Medications:  Prescriptions prior to admission  Medication Sig Dispense Refill  . amLODipine (NORVASC) 5 MG tablet Take 5 mg by mouth daily.      Marland Kitchen aspirin 81 MG tablet Take 81 mg by mouth 2 (two) times daily.       . carvedilol (COREG) 25 MG tablet Take 25 mg by mouth 2 (two) times daily with a meal.      . doxazosin (CARDURA) 1 MG tablet Take 1 mg by mouth at bedtime.      . fish oil-omega-3 fatty acids 1000 MG capsule Take 2 g by mouth daily.       Marland Kitchen glipiZIDE (GLUCOTROL XL) 5 MG 24 hr tablet Take 5 mg by mouth daily.      . lansoprazole (PREVACID) 15 MG capsule Take 15 mg by mouth daily as needed. For acid reflux      .  lisinopril (PRINIVIL,ZESTRIL) 2.5 MG tablet Take 2.5 mg by mouth daily.      Marland Kitchen losartan (COZAAR) 100 MG tablet Take 50-100 mg by mouth 2 (two) times daily. 1 tab in the morning and 1/2 at night      . PARoxetine (PAXIL) 20 MG tablet Take 20 mg by mouth daily.      . pravastatin (PRAVACHOL) 40 MG tablet Take 40 mg by mouth at bedtime.      Marland Kitchen spironolactone (ALDACTONE) 25 MG tablet Take 25 mg by mouth daily.      Marland Kitchen glucose blood (ONE TOUCH ULTRA TEST) test strip Check blood sugar daily and if blood sugar fluctuates.  250.00  100 each  3  . Lancets 28G MISC Use daily as needed to check sugar, dx 250.00  100 each  3    Assessment: 61 y/o male s/p TKA to be started on Coumadin for VTE prophylaxis, bridged with Lovenox until INR >/= 1.8.  Baseline INR 0.97.  Coumadin score = 6. No excessive intra-op bleeding noted.   Goal of Therapy:  INR 2-3 Monitor platelets by anticoagulation protocol: Yes   Plan:  -  Coumadin 7.5 mg po x 1 this evening - Daily PT/INR - Continue Lovenox 30 mg sq q12hr, dc once INR >/=1.8     Doris Cheadle, PharmD Clinical Pharmacist Pager: [phone] Phone: [phone] 06/01/2012 1:49 PM

## 2012-06-02 ENCOUNTER — Other Ambulatory Visit: Payer: 59

## 2012-06-02 ENCOUNTER — Encounter (HOSPITAL_COMMUNITY): Payer: Self-pay | Admitting: General Practice

## 2012-06-02 LAB — CBC
MCH: 31.1 pg (ref 26.0–34.0)
MCV: 91.9 fL (ref 78.0–100.0)
Platelets: 184 10*3/uL (ref 150–400)
RDW: 12.7 % (ref 11.5–15.5)
WBC: 8 10*3/uL (ref 4.0–10.5)

## 2012-06-02 LAB — BASIC METABOLIC PANEL
Calcium: 7.8 mg/dL — ABNORMAL LOW (ref 8.4–10.5)
Creatinine, Ser: 1.01 mg/dL (ref 0.50–1.35)
GFR calc Af Amer: >90 mL/min (ref >90)
GFR calc non Af Amer: 79 mL/min — ABNORMAL LOW (ref >90)

## 2012-06-02 LAB — GLUCOSE, CAPILLARY
Glucose-Capillary: 119 mg/dL — ABNORMAL HIGH (ref 70–99)
Glucose-Capillary: 123 mg/dL — ABNORMAL HIGH (ref 70–99)

## 2012-06-02 LAB — PREPARE RBC (CROSSMATCH)

## 2012-06-02 MED ORDER — WARFARIN SODIUM 7.5 MG PO TABS
7.5000 mg | ORAL_TABLET | Freq: Every day | ORAL | Status: DC
  Administered 2012-06-02: 7.5 mg via ORAL
  Filled 2012-06-02 (×2): qty 1

## 2012-06-02 MED ORDER — FUROSEMIDE 10 MG/ML IJ SOLN
20.0000 mg | Freq: Once | INTRAMUSCULAR | Status: AC
  Administered 2012-06-02: 20 mg via INTRAVENOUS
  Filled 2012-06-02: qty 2

## 2012-06-02 MED ORDER — OXYCODONE-ACETAMINOPHEN 5-325 MG PO TABS
1.0000 | ORAL_TABLET | ORAL | Status: DC | PRN
  Administered 2012-06-02 – 2012-06-03 (×4): 2 via ORAL
  Filled 2012-06-02 (×5): qty 2

## 2012-06-02 NOTE — Evaluation (Signed)
Physical Therapy Evaluation Patient Details Name: Tim Mccullough MRN: [id number] DOB: [birth year] Today's Date: 06/02/2012 Time: 4098-1191 PT Time Calculation (min): 28 min  PT Assessment / Plan / Recommendation Clinical Impression  Pt is 61 y/o male admitted for s/p right TKA.  Pt very motivated and willing to work.  Pt will benefit from acute PT services to improve overall mobility and prepare for safe d/c home with family.    PT Assessment  Patient needs continued PT services    Follow Up Recommendations  Home health PT    Barriers to Discharge        Equipment Recommendations  None recommended by PT    Recommendations for Other Services     Frequency 7X/week    Precautions / Restrictions Precautions Precautions: Knee Required Braces or Orthoses: Knee Immobilizer - Right Knee Immobilizer - Right: On at all times Restrictions Weight Bearing Restrictions: Yes RLE Weight Bearing: Weight bearing as tolerated   Pertinent Vitals/Pain 6/10 right knee pain with ambulation      Mobility  Bed Mobility Bed Mobility: Supine to Sit;Sitting - Scoot to Edge of Bed Supine to Sit: 4: Min assist;With rails Sitting - Scoot to [place] of Bed: 4: Min guard Details for Bed Mobility Assistance: (A) with Right LE and VCs for proper technique. Transfers Transfers: Sit to Stand;Stand to Sit Sit to Stand: 4: Min assist;From bed Stand to Sit: To chair/3-in-1;4: Min assist Details for Transfer Assistance: (A) to initiate transfer and to slowly descend to recliner with max cues for proper technique Ambulation/Gait Ambulation/Gait Assistance: 4: Min assist Ambulation Distance (Feet): 40 Feet Assistive device: Rolling walker Ambulation/Gait Assistance Details: (A) to manage RW with max cues for proper step sequence and RW placement.  Pt tends to pick up RW during ambulation. Gait Pattern: Step-to pattern;Decreased stride length;Shuffle;Trunk flexed    Exercises Total Joint Exercises Ankle  Circles/Pumps: Strengthening;Both;10 reps Quad Sets: Strengthening;Both;10 reps Heel Slides: AAROM;Right;5 reps   PT Diagnosis: Difficulty walking;Abnormality of gait;Generalized weakness;Acute pain  PT Problem List: Decreased strength;Decreased range of motion;Decreased activity tolerance;Decreased balance;Decreased coordination;Decreased knowledge of use of DME;Pain PT Treatment Interventions:     PT Goals Acute Rehab PT Goals PT Goal Formulation: With patient Time For Goal Achievement: 06/09/12 Potential to Achieve Goals: Good Pt will go Supine/Side to Sit: with modified independence PT Goal: Supine/Side to Sit - Progress: Goal set today Pt will go Sit to Supine/Side: with modified independence PT Goal: Sit to Supine/Side - Progress: Goal set today Pt will go Sit to Stand: with modified independence PT Goal: Sit to Stand - Progress: Goal set today Pt will go Stand to Sit: with modified independence PT Goal: Stand to Sit - Progress: Goal set today Pt will Ambulate: >150 feet;with modified independence;with rolling walker PT Goal: Ambulate - Progress: Goal set today Pt will Go Up / Down Stairs: with min assist;1-2 stairs;with rolling walker PT Goal: Up/Down Stairs - Progress: Goal set today Pt will Perform Home Exercise Program: Independently PT Goal: Perform Home Exercise Program - Progress: Goal set today  Visit Information  Last PT Received On: 06/02/12 Assistance Needed: +1    Subjective Data  Subjective: "I'm feeling ok.  Just a little tired." Patient Stated Goal: To go home with daughter in law   Prior Functioning  Home Living Lives With: Family Available Help at Discharge: Family Type of Home:  (townehome) Home Access: Stairs to enter Secretary/administrator of Steps: 1 Entrance Stairs-Rails: None Home Layout: Two level;1/2 bath on main level (  plans to stay downstairs and take sponge baths) Alternate Level Stairs-Number of Steps: 12 Alternate Level Stairs-Rails:  Right Bathroom Shower/Tub: Tub/shower unit;Curtain Bathroom Toilet: Standard Home Adaptive Equipment: Walker - rolling;Bedside commode/3-in-1;Shower chair with back Prior Function Level of Independence: Independent Able to Take Stairs?: Yes (difficult) Driving: Yes 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, historic buildings, Barrister's clerk) Communication Communication: No difficulties Dominant Hand: Right    Cognition  Overall Cognitive Status: Appears within functional limits for tasks assessed/performed Arousal/Alertness: Awake/alert Orientation Level: Appears intact for tasks assessed Behavior During Session: [facility] for tasks performed    Extremity/Trunk Assessment Right Lower Extremity Assessment RLE ROM/Strength/Tone: Within functional levels Left Lower Extremity Assessment LLE ROM/Strength/Tone: Unable to fully assess;Due to pain;Deficits LLE ROM/Strength/Tone Deficits: AAROM knee flexion 0-60 degrees   Balance    End of Session PT - End of Session Equipment Utilized During Treatment: Gait belt;Right knee immobilizer Activity Tolerance: Patient tolerated treatment well Patient left: in chair;with call bell/phone within reach Nurse Communication: Mobility status CPM Right Knee CPM Right Knee: Off  GP     Firman Petrow 06/02/2012, 8:34 AM Jake Shark, PT DPT [phone]

## 2012-06-02 NOTE — Progress Notes (Signed)
Subjective: C/o nausea this morning.  Knee pain controlled.  Did great with therapy.  No c/o feeling lightheaded or dizzy.    Objective: Vital signs in last 24 hours: Temp:  [97.4 F (36.3 C)-99.2 F (37.3 C)] 97.5 F (36.4 C) (07/25 0644) Pulse Rate:  [87-115] 115  (07/25 0644) Resp:  [12-22] 18  (07/25 0800) BP: (100-139)/(53-83) 117/62 mmHg (07/25 0644) SpO2:  [92 %-99 %] 95 % (07/25 0644)  Intake/Output from previous day: 07/24 0701 - 07/25 0700 In: 2425 [P.O.:245; I.V.:2180] Out: 1185 [Urine:1160; Drains:25] Intake/Output this shift:     [facility] 06/02/12 0555  HGB 8.5*    Basename 06/02/12 0555  WBC 8.0  RBC 2.73*  HCT 25.1*  PLT 184    Basename 06/02/12 0555 06/01/12 0744  NA 133* 138  K 4.1 5.1  CL 101 102  CO2 23 24  BUN 24* 31*  CREATININE 1.01 1.17  GLUCOSE 160* 163*  CALCIUM 7.8* 9.3    Basename 06/02/12 0555  LABPT --  INR 1.27    Exam:  Dressing c/d/i.  Calf nt, nvi.  Assessment/Plan: ABLA--  preop hgb 11.5.  Will recheck this afternoon.  May need transfusion prbc's.   Nausea--  D/c pca. Possible d/c home fri or sat.     OWENS,JAMES M 06/02/2012, 10:02 AM

## 2012-06-02 NOTE — Progress Notes (Signed)
Referral received for SNF. Chart reviewed and CSW has spoken with RNCM who indicates that patient is for DC to home with Home Health and DME.  CSW to sign off. Please re-consult if CSW needs arise.  Kristyn Obyrne T. Suhana Wilner, LCSWA  [phone]  

## 2012-06-02 NOTE — Progress Notes (Signed)
Physical Therapy Progress Note  06/02/12 1400  PT Visit Information  Last PT Received On 06/02/12  Assistance Needed +1  PT Time Calculation  PT Start Time 1349  PT Stop Time 1430  PT Time Calculation (min) 41 min  Subjective Data  Subjective "I think I'm going to have to get blood."  Precautions  Precautions Knee  Required Braces or Orthoses Knee Immobilizer - Right  Knee Immobilizer - Right On at all times  Restrictions  Weight Bearing Restrictions Yes  RLE Weight Bearing WBAT  Cognition  Overall Cognitive Status Appears within functional limits for tasks assessed/performed  Arousal/Alertness Awake/alert  Orientation Level Appears intact for tasks assessed  Behavior During Session [facility] for tasks performed  Bed Mobility  Bed Mobility Supine to Sit;Sit to Supine  Supine to Sit 4: Min guard  Sitting - Scoot to [place] of Bed 4: Min guard  Sit to Supine 4: Min guard  Details for Bed Mobility Assistance Minguard for safety with cues for proper technique  Transfers  Transfers Sit to Stand;Stand to Sit  Sit to Stand 4: Min assist;From bed  Stand to Sit 4: Min assist;To bed  Details for Transfer Assistance (A) to initiate transfer and to slowly descend to bed with cues for proper hand and right LE placement during trasnfer.  Ambulation/Gait  Ambulation/Gait Assistance 4: Min guard  Ambulation Distance (Feet) 125 Feet  Assistive device Rolling walker  Ambulation/Gait Assistance Details minguard for safety with cues for proper step sequence.  Gait Pattern Step-to pattern;Decreased stride length;Shuffle;Trunk flexed  Exercises  Exercises Total Joint  Total Joint Exercises  Ankle Circles/Pumps Strengthening;Both;10 reps  Quad Sets Strengthening;Both;10 reps  Heel Slides AAROM;Right;10 reps  PT - End of Session  Equipment Utilized During Treatment Gait belt;Right knee immobilizer  Activity Tolerance Patient tolerated treatment well  Patient left in bed;in CPM;with call bell/phone  within reach  Nurse Communication Mobility status  PT - Assessment/Plan  Comments on Treatment Session Pt able to increase ambulation distance and improved overall mobility.  RN and IV RN in room to start new IV to begin blood therefore unable to finish HEP.  Spoke with RN tech to [employer].  PT Plan Discharge plan remains appropriate  PT Frequency 7X/week  Follow Up Recommendations Home health PT  Equipment Recommended None recommended by PT;None recommended by OT  Acute Rehab PT Goals  PT Goal Formulation With patient  Time For Goal Achievement 06/09/12  Potential to Achieve Goals Good  Pt will go Supine/Side to Sit with modified independence  PT Goal: Supine/Side to Sit - Progress Progressing toward goal  Pt will go Sit to Supine/Side with modified independence  PT Goal: Sit to Supine/Side - Progress Progressing toward goal  Pt will go Sit to Stand with modified independence  PT Goal: Sit to Stand - Progress Progressing toward goal  Pt will go Stand to Sit with modified independence  PT Goal: Stand to Sit - Progress Progressing toward goal  Pt will Ambulate >150 feet;with modified independence;with rolling walker  PT Goal: Ambulate - Progress Progressing toward goal  Pt will Perform Home Exercise Program Independently  PT Goal: Perform Home Exercise Program - Progress Progressing toward goal  PT General Charges  $$ ACUTE PT VISIT 1 Procedure  PT Treatments  $Gait Training 8-22 mins  $Therapeutic Exercise 8-22 mins  $Therapeutic Activity 8-22 mins    Gratz, Cameron DPT [phone]

## 2012-06-02 NOTE — Progress Notes (Signed)
ANTICOAGULATION CONSULT NOTE - Follow Up Consult  Pharmacy Consult for warfarin Indication: VTE prophylaxis  Allergies  Allergen Reactions  . Metformin     REACTION: intolerant, h/o elevated Cr 2011    Patient Measurements:     Vital Signs: Temp: 97.5 F (36.4 C) (07/25 0644) Temp src: Oral (07/25 0644) BP: 117/62 mmHg (07/25 0644) Pulse Rate: 115  (07/25 0644)  Labs:  Basename 06/02/12 1330 06/02/12 0555 06/01/12 0744  HGB 8.1* 8.5* --  HCT 23.3* 25.1* --  PLT -- 184 --  APTT -- -- --  LABPROT -- 16.2* --  INR -- 1.27 --  HEPARINUNFRC -- -- --  CREATININE -- 1.01 1.17  CKTOTAL -- -- --  CKMB -- -- --  TROPONINI -- -- --    The CrCl is unknown because both a height and weight (above a minimum accepted value) are required for this calculation.   Medications:  Prescriptions prior to admission  Medication Sig Dispense Refill  . amLODipine (NORVASC) 5 MG tablet Take 5 mg by mouth daily.      Marland Kitchen aspirin 81 MG tablet Take 81 mg by mouth 2 (two) times daily.       . carvedilol (COREG) 25 MG tablet Take 25 mg by mouth 2 (two) times daily with a meal.      . doxazosin (CARDURA) 1 MG tablet Take 1 mg by mouth at bedtime.      . fish oil-omega-3 fatty acids 1000 MG capsule Take 2 g by mouth daily.       Marland Kitchen glipiZIDE (GLUCOTROL XL) 5 MG 24 hr tablet Take 5 mg by mouth daily.      . lansoprazole (PREVACID) 15 MG capsule Take 15 mg by mouth daily as needed. For acid reflux      . lisinopril (PRINIVIL,ZESTRIL) 2.5 MG tablet Take 2.5 mg by mouth daily.      Marland Kitchen losartan (COZAAR) 100 MG tablet Take 50-100 mg by mouth 2 (two) times daily. 1 tab in the morning and 1/2 at night      . PARoxetine (PAXIL) 20 MG tablet Take 20 mg by mouth daily.      . pravastatin (PRAVACHOL) 40 MG tablet Take 40 mg by mouth at bedtime.      Marland Kitchen spironolactone (ALDACTONE) 25 MG tablet Take 25 mg by mouth daily.      Marland Kitchen glucose blood (ONE TOUCH ULTRA TEST) test strip Check blood sugar daily and if blood  sugar fluctuates.  250.00  100 each  3  . Lancets 28G MISC Use daily as needed to check sugar, dx 250.00  100 each  3    Assessment: 61 y/o male s/p TKA on Coumadin for VTE prophylaxis, being bridged with Lovenox until INR >/= 1.8. Coumadin score = 6. Coumadin 7.5mg  given last night, INR up from baseline of 0.97 to 1.27 today.  No bleeding noted, but Hg postop is low at 8.5 this AM and 8.1 now (1330). PA noted close monitoring and consideration for transfusion.   Goal of Therapy:  INR 2-3 Monitor platelets by anticoagulation protocol: Yes   Plan:  - Coumadin 7.5 mg po x 1 this evening - Daily PT/INR - Continue Lovenox 30 mg sq q12hr, dc once INR >/=1.8   Vania Rea. Darin Engels.D. Clinical Pharmacist Pager [phone] Phone [phone] 06/02/2012 2:16 PM

## 2012-06-02 NOTE — Care Management Note (Signed)
    Page 1 of 1   06/02/2012     3:41:53 PM   CARE MANAGEMENT NOTE 06/02/2012  Patient:  Tim Mccullough, Tim Mccullough   Account Number:  [id number]  Date Initiated:  06/02/2012  Documentation initiated by:  Anette Guarneri  Subjective/Objective Assessment:   POD#1 s/p right TKA  plans to d/c home, daughter-in-law will assist after d/c  Has RW/BSC, not sure about CPM  MD office pre-arranged [facility] for [employer] services     Action/Plan:   home with [facility] services   Anticipated DC Date:  06/04/2012   Anticipated DC Plan:  HOME W HOME HEALTH SERVICES      DC Planning Services  CM consult      Choice offered to / List presented to:             Status of service:  Completed, signed off Medicare Important Message given?  NO (If response is "NO", the following Medicare IM given date fields will be blank) Date Medicare IM given:   Date Additional Medicare IM given:    Discharge Disposition:  HOME W HOME HEALTH SERVICES  Per UR Regulation:  Reviewed for med. necessity/level of care/duration of stay  If discussed at Long Length of Stay Meetings, dates discussed:    Comments:  06/02/12  10:37  Anette Guarneri RN/CM spoke with patient regarding d/c planning, MD office has pre-arranged [facility] services with [facility], patieht has DME except CPM. This CM will f/u with [employer]. regarding CPM. Will have HHPT w/[facility]

## 2012-06-02 NOTE — Evaluation (Signed)
Occupational Therapy Evaluation Patient Details Name: Tim Mccullough MRN: [id number] DOB: [birth year] Today's Date: 06/02/2012 Time: 6295-2841 OT Time Calculation (min): 24 min  OT Assessment / Plan / Recommendation Clinical Impression  Pt is s/p R TKA and displays decreased strength and activity tolerance and increased pain. Will benefit from skilled OT services to imrprove ADL independence.     OT Assessment  Patient needs continued OT Services    Follow Up Recommendations  No OT follow up;Home health OT    Barriers to Discharge      Equipment Recommendations  None recommended by PT;None recommended by OT    Recommendations for Other Services    Frequency  Min 2X/week    Precautions / Restrictions Precautions Precautions: Knee Required Braces or Orthoses: Knee Immobilizer - Right Knee Immobilizer - Right: On at all times Restrictions Weight Bearing Restrictions: Yes RLE Weight Bearing: Weight bearing as tolerated        ADL  Eating/Feeding: Simulated Where Assessed - Eating/Feeding: Bed level Grooming: Simulated;Wash/dry hands;Set up Where Assessed - Grooming: Supported sitting Upper Body Bathing: Simulated;Chest;Right arm;Left arm;Abdomen;Set up Where Assessed - Upper Body Bathing: Supported sitting Lower Body Bathing: Simulated;Moderate assistance Where Assessed - Lower Body Bathing: Supported sit to stand Upper Body Dressing: Simulated;Set up Where Assessed - Upper Body Dressing: Unsupported sitting Lower Body Dressing: Simulated;Maximal assistance;Other (comment) (pt nauseous with all movement) Where Assessed - Lower Body Dressing: Supported sit to stand Toilet Transfer: Mining engineer Method: Stand pivot Toileting - Clothing Manipulation and Hygiene: Simulated;Minimal assistance Where Assessed - Engineer, mining and Hygiene: Standing Tub/Shower Transfer Method: Not assessed Equipment Used: Rolling walker ADL  Comments: Pt has had R THR in the past and only has sock aid at home now. Pt still does not cross L leg up and normally leans forward to don LB clothing. Pt states daughter in law the only person who will consistently be at home with him and she just had a baby. So he will likely need to use AE at least initially for LB self care. Pt limited this session due to nausea and feeling light headed. Nsg in room and aware.     OT Diagnosis: Generalized weakness  OT Problem List: Decreased strength;Decreased activity tolerance;Decreased knowledge of use of DME or AE;Pain OT Treatment Interventions: Self-care/ADL training;Therapeutic activities;DME and/or AE instruction;Patient/family education   OT Goals Acute Rehab OT Goals OT Goal Formulation: With patient Time For Goal Achievement: 06/09/12 Potential to Achieve Goals: Good ADL Goals Pt Will Perform Grooming: with supervision;Standing at sink ADL Goal: Grooming - Progress: Goal set today Pt Will Perform Lower Body Bathing: with supervision;Sit to stand from chair;Sit to stand from bed;with adaptive equipment ADL Goal: Lower Body Bathing - Progress: Goal set today Pt Will Perform Lower Body Dressing: with supervision;Sit to stand from chair;Sit to stand from bed;with adaptive equipment ADL Goal: Lower Body Dressing - Progress: Goal set today Pt Will Transfer to Toilet: with supervision;Ambulation;with DME;3-in-1 ADL Goal: Toilet Transfer - Progress: Goal set today Pt Will Perform Toileting - Clothing Manipulation: with supervision;Standing ADL Goal: Toileting - Clothing Manipulation - Progress: Goal set today  Visit Information  Last OT Received On: 06/02/12 Assistance Needed: +1    Subjective Data  Subjective: I feel nauseous Patient Stated Goal: to feel better   Prior Functioning  Vision/Perception  Home Living Lives With: Family Available Help at Discharge: Family Type of Home:  (townehome) Home Access: Stairs to enter [employer] of Steps: 1 Entrance Stairs-Rails:  None Home Layout: Two level;1/2 bath on main level (plans to stay downstairs and take sponge baths) Alternate Level Stairs-Number of Steps: 12 Alternate Level Stairs-Rails: Right Bathroom Shower/Tub: Tub/shower unit;Curtain Bathroom Toilet: Standard Home Adaptive Equipment: Walker - rolling;Bedside commode/3-in-1;Shower chair with back;Sock aid Prior Function Level of Independence: Independent Able to Take Stairs?: Yes (difficult) Driving: Yes 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, historic buildings, Barrister's clerk) Communication Communication: No difficulties Dominant Hand: Right      Cognition  Overall Cognitive Status: Appears within functional limits for tasks assessed/performed Arousal/Alertness: Awake/alert Orientation Level: Appears intact for tasks assessed Behavior During Session: [facility] for tasks performed    Extremity/Trunk Assessment Right Upper Extremity Assessment RUE ROM/Strength/Tone: [facility] for tasks assessed Left Upper Extremity Assessment LUE ROM/Strength/Tone: WFL for tasks assessed Right Lower Extremity Assessment RLE ROM/Strength/Tone: Within functional levels Left Lower Extremity Assessment LLE ROM/Strength/Tone: Unable to fully assess;Due to pain;Deficits LLE ROM/Strength/Tone Deficits: AAROM knee flexion 0-60 degrees   Mobility Bed Mobility Bed Mobility: Sit to Supine Supine to Sit: 4: Min assist;With rails Sitting - Scoot to Edge of Bed: 4: Min guard Sit to Supine: 4: Min assist;HOB flat;With rail Details for Bed Mobility Assistance: Assist for R LE onto the bed and verbal cues Transfers Transfers: Sit to Stand;Stand to Sit Sit to Stand: 4: Min assist;With upper extremity assist;From chair/3-in-1 Stand to Sit: 4: Min assist;With upper extremity assist;To bed Details for Transfer Assistance: min verbal cues for technique and hand placement       Balance    End of Session OT - End of Session Equipment Utilized During  Treatment: Gait belt Activity Tolerance: Patient limited by fatigue;Patient limited by pain;Other (comment) (nausea) Patient left: in bed;with call bell/phone within reach CPM Right Knee CPM Right Knee: Off  GO     Lennox Laity [phone] 06/02/2012, 11:24 AM

## 2012-06-03 LAB — CBC
MCH: 31.5 pg (ref 26.0–34.0)
MCV: 90.3 fL (ref 78.0–100.0)
Platelets: 150 10*3/uL (ref 150–400)
RBC: 2.98 MIL/uL — ABNORMAL LOW (ref 4.22–5.81)
RDW: 13.3 % (ref 11.5–15.5)
WBC: 7.9 10*3/uL (ref 4.0–10.5)

## 2012-06-03 LAB — TYPE AND SCREEN
ABO/RH(D): O POS
Unit division: 0

## 2012-06-03 LAB — BASIC METABOLIC PANEL
CO2: 23 mEq/L (ref 19–32)
Calcium: 7.5 mg/dL — ABNORMAL LOW (ref 8.4–10.5)
Creatinine, Ser: 1.32 mg/dL (ref 0.50–1.35)
GFR calc Af Amer: 66 mL/min — ABNORMAL LOW (ref >90)
Sodium: 132 mEq/L — ABNORMAL LOW (ref 135–145)

## 2012-06-03 LAB — GLUCOSE, CAPILLARY

## 2012-06-03 LAB — PROTIME-INR: Prothrombin Time: 17.3 seconds — ABNORMAL HIGH (ref 11.6–15.2)

## 2012-06-03 MED ORDER — WARFARIN SODIUM 7.5 MG PO TABS
7.5000 mg | ORAL_TABLET | Freq: Once | ORAL | Status: DC
  Filled 2012-06-03: qty 1

## 2012-06-03 MED ORDER — METHOCARBAMOL 500 MG PO TABS: 500.0000 mg | ORAL_TABLET | Freq: Four times a day (QID) | ORAL | Status: AC | PRN

## 2012-06-03 MED ORDER — OXYCODONE-ACETAMINOPHEN 10-325 MG PO TABS: 1.0000 | ORAL_TABLET | ORAL | Status: AC | PRN

## 2012-06-03 MED ORDER — PNEUMOCOCCAL VAC POLYVALENT 25 MCG/0.5ML IJ INJ
0.5000 mL | INJECTION | INTRAMUSCULAR | Status: AC
  Administered 2012-06-03: 0.5 mL via INTRAMUSCULAR
  Filled 2012-06-03: qty 0.5

## 2012-06-03 MED ORDER — WARFARIN SODIUM 5 MG PO TABS: 5.0000 mg | ORAL_TABLET | Freq: Every day | ORAL | Status: DC

## 2012-06-03 MED ORDER — ENOXAPARIN SODIUM 30 MG/0.3ML ~~LOC~~ SOLN: 30.0000 mg | Freq: Two times a day (BID) | SUBCUTANEOUS | Status: DC

## 2012-06-03 NOTE — Progress Notes (Signed)
Physical Therapy Progress Note  06/03/12 0914  PT Visit Information  Last PT Received On 06/03/12  Assistance Needed +1  PT Time Calculation  PT Start Time 0840  PT Stop Time 0907  PT Time Calculation (min) 27 min  Precautions  Precautions Knee  Required Braces or Orthoses Knee Immobilizer - Right  Knee Immobilizer - Right On at all times  Restrictions  Weight Bearing Restrictions Yes  RLE Weight Bearing WBAT  Cognition  Overall Cognitive Status Appears within functional limits for tasks assessed/performed  Arousal/Alertness Awake/alert  Orientation Level Appears intact for tasks assessed  Behavior During Session [facility] for tasks performed  Bed Mobility  Bed Mobility Supine to Sit;Sit to Supine  Supine to Sit 6: Modified independent (Device/Increase time)  Sitting - Scoot to Edge of Bed 6: Modified independent (Device/Increase time)  Sit to Supine 6: Modified independent (Device/Increase time)  Transfers  Transfers Sit to Stand;Stand to Sit  Sit to Stand 5: Supervision  Stand to Sit 5: Supervision  Details for Transfer Assistance Supervision for safety with cues for hand placement  Ambulation/Gait  Ambulation/Gait Assistance 6: Modified independent (Device/Increase time)  Ambulation Distance (Feet) 100 Feet  Assistive device Rolling walker  Stairs Yes  Stairs Assistance 4: Min assist  Stairs Assistance Details (indicate cue type and reason) (A) to manage RW with cues for proper step sequence  Stair Management Technique Backwards;With walker  Number of Stairs 1   PT - End of Session  Equipment Utilized During Treatment Gait belt;Right knee immobilizer  Activity Tolerance Patient tolerated treatment well  Patient left in chair;with call bell/phone within reach  Nurse Communication Mobility status  PT - Assessment/Plan  Comments on Treatment Session Pt moving well and able to complete stair negotiation and improve overall gait sequence.  PT Plan Discharge plan remains  appropriate  PT Frequency 7X/week  Follow Up Recommendations Home health PT  Equipment Recommended None recommended by PT  Acute Rehab PT Goals  PT Goal Formulation With patient  Time For Goal Achievement 06/09/12  Potential to Achieve Goals Good  Pt will go Supine/Side to Sit with modified independence  PT Goal: Supine/Side to Sit - Progress Met  Pt will go Sit to Supine/Side with modified independence  PT Goal: Sit to Supine/Side - Progress Met  Pt will go Sit to Stand with modified independence  PT Goal: Sit to Stand - Progress Met  Pt will go Stand to Sit with modified independence  PT Goal: Stand to Sit - Progress Progressing toward goal  Pt will Ambulate >150 feet;with modified independence;with rolling walker  PT Goal: Ambulate - Progress Partly met  Pt will Go Up / Down Stairs with min assist;1-2 stairs;with rolling walker  PT Goal: Up/Down Stairs - Progress Met  PT General Charges  $$ ACUTE PT VISIT 1 Procedure  PT Treatments  $Gait Training 23-37 mins    [place], [place] DPT [phone]

## 2012-06-03 NOTE — Progress Notes (Signed)
Subjective: Feels much better after transfusion.  Did great with therapy.  Wants to go home.   Objective: Vital signs in last 24 hours: Temp:  [98.4 F (36.9 C)-99.5 F (37.5 C)] 99 F (37.2 C) (07/26 0707) Pulse Rate:  [87-107] 107  (07/26 0800) Resp:  [18-20] 18  (07/26 0707) BP: (75-102)/(40-71) 102/40 mmHg (07/26 0800) SpO2:  [93 %-95 %] 93 % (07/26 0707) Weight:  [110.678 kg (244 lb)] 110.678 kg (244 lb) (07/25 2040)  Intake/Output from previous day: 07/25 0701 - 07/26 0700 In: 2650 [P.O.:820; I.V.:1130; Blood:700] Out: 1251 [Urine:1151; Drains:100] Intake/Output this shift:     Basename 06/03/12 0615 06/02/12 1330 06/02/12 0555  HGB 9.4* 8.1* 8.5*    Basename 06/03/12 0615 06/02/12 1330 06/02/12 0555  WBC 7.9 -- 8.0  RBC 2.98* -- 2.73*  HCT 26.9* 23.3* --  PLT 150 -- 184    Basename 06/03/12 0615 06/02/12 0555  NA 132* 133*  K 4.5 4.1  CL 100 101  CO2 23 23  BUN 30* 24*  CREATININE 1.32 1.01  GLUCOSE 195* 160*  CALCIUM 7.5* 7.8*    Basename 06/03/12 0615 06/02/12 0555  LABPT -- --  INR 1.39 1.27    Wound looks good.  Staples intact.  No drainage or signs of infection.  Calf nontender. Drain removed.  Assessment/Plan: D/c home today.  F/u 2 weeks postop.     Gabby Rackers M 06/03/2012, 12:28 PM

## 2012-06-03 NOTE — Progress Notes (Signed)
ANTICOAGULATION CONSULT NOTE - Follow Up Consult  Pharmacy Consult for Coumadin Indication: VTE prophylaxis  Allergies  Allergen Reactions  . Metformin     REACTION: intolerant, h/o elevated Cr 2011    Patient Measurements: Height: 5\' 10"  (177.8 cm) Weight: 244 lb (110.678 kg) IBW/kg (Calculated) : 73    Vital Signs: Temp: 99 F (37.2 C) (07/26 0707) Temp src: Oral (07/26 0707) BP: 102/40 mmHg (07/26 0800) Pulse Rate: 107  (07/26 0800)  Labs:  Basename 06/03/12 0615 06/02/12 1330 06/02/12 0555 06/01/12 0744  HGB 9.4* 8.1* -- --  HCT 26.9* 23.3* 25.1* --  PLT 150 -- 184 --  APTT -- -- -- --  LABPROT 17.3* -- 16.2* --  INR 1.39 -- 1.27 --  HEPARINUNFRC -- -- -- --  CREATININE 1.32 -- 1.01 1.17  CKTOTAL -- -- -- --  CKMB -- -- -- --  TROPONINI -- -- -- --    Estimated Creatinine Clearance: 74.2 ml/min (by C-G formula based on Cr of 1.32).   Assessment: 61 yo M admitted 7/24 for rt TKA d/t end-stage DJD. Pt on Coumadin for VTE prophylaxis s/p TKA with Lovenox 30 q12hr bridge until INR >/= 1.8.  Coumadin pt score = 6; INR 0.97 >1.27 >1.39; Hb 11.5 preop, 9.4 < 8.1 postop, received 1 unit PRBCs 7/25. Plts 150 < 184. 50mL bloody drainage noted from knee drain.   Goal of Therapy:  INR 2-3 Monitor platelets by anticoagulation protocol: Yes   Plan:  - Coumadin 7.5 mg po x 1 this evening  - Daily PT/INR, watch plts - Continue Lovenox 30 mg sq q12hr, dc once INR >/=1.8  Thank you for the consult.  Tomi Bamberger, PharmD Clinical Pharmacist Pager: [phone] Pharmacy: [phone] 06/03/2012 11:46 AM

## 2012-06-03 NOTE — Progress Notes (Signed)
Physical Therapy Treatment Patient Details Name: Tim Mccullough MRN: [id number] DOB: 10/21/1951 Today's Date: 06/03/2012 Time: 0803-0820 PT Time Calculation (min): 17 min  PT Assessment / Plan / Recommendation Comments on Treatment Session  Pt able to perform HEP with minimal VCs.  Limited session due to RN present and PA present to change pt dressing and review d/c plans.  Will perform stair negotiation and further ambulations prior to d/c.     Follow Up Recommendations  Home health PT    Barriers to Discharge        Equipment Recommendations  None recommended by PT    Recommendations for Other Services    Frequency 7X/week   Plan Discharge plan remains appropriate    Precautions / Restrictions Precautions Precautions: Knee Required Braces or Orthoses: Knee Immobilizer - Right Knee Immobilizer - Right: On at all times Restrictions Weight Bearing Restrictions: Yes RLE Weight Bearing: Weight bearing as tolerated   Pertinent Vitals/Pain 3/10 right knee pain    Mobility       Exercises Total Joint Exercises Ankle Circles/Pumps: Strengthening;Both;10 reps Quad Sets: Strengthening;Both;10 reps Short Arc Quad: Strengthening;Right;10 reps Heel Slides: AAROM;Right;10 reps Hip ABduction/ADduction: Strengthening;Right;10 reps Straight Leg Raises: Strengthening;Right;10 reps   PT Diagnosis:    PT Problem List:   PT Treatment Interventions:     PT Goals Acute Rehab PT Goals PT Goal Formulation: With patient Time For Goal Achievement: 06/09/12 Potential to Achieve Goals: Good Pt will Perform Home Exercise Program: Independently PT Goal: Perform Home Exercise Program - Progress: Progressing toward goal  Visit Information  Last PT Received On: 06/03/12 Assistance Needed: +1    Subjective Data  Subjective: "Didn't sleep that good last night but thats normal for me."   Cognition  Overall Cognitive Status: Appears within functional limits for tasks  assessed/performed Arousal/Alertness: Awake/alert Orientation Level: Appears intact for tasks assessed Behavior During Session: [facility] for tasks performed    Balance     End of Session PT - End of Session Equipment Utilized During Treatment: Gait belt;Right knee immobilizer Activity Tolerance: Patient tolerated treatment well Patient left: in bed;with nursing in room (PA in room) Nurse Communication: Mobility status   GP     Tim Mccullough 06/03/2012, 9:13 AM Jake Shark, PT DPT [phone]

## 2012-06-07 ENCOUNTER — Ambulatory Visit: Payer: 59 | Admitting: Family Medicine

## 2012-06-07 DIAGNOSIS — Z0289 Encounter for other administrative examinations: Secondary | ICD-10-CM

## 2012-06-14 ENCOUNTER — Other Ambulatory Visit: Payer: Self-pay | Admitting: Family Medicine

## 2012-06-14 NOTE — Telephone Encounter (Signed)
Electronic refill request.  Patient missed his last appt, I think last week.

## 2012-06-15 NOTE — Telephone Encounter (Signed)
Sent.  Needs to reschedule/keep OV.

## 2012-06-17 NOTE — Discharge Summary (Signed)
  ABBREVIATED DISCHARGE SUMMARY      DATE OF HOSPITALIZATION:  24 July 13  REASON FOR HOSPITALIZATION:  61 YO WM WITH HX END STAGE DJD RIGHT KNEE AND PAIN.    SIGNIFICANT FINDINGS:  DJD  OPERATION:  RIGHT TOTAL KNEE REPLACEMENT  FINAL DIAGNOSIS:  SAME  SECONDARY DIAGNOSIS: none  CONSULTANTS:  none  DISCHARGE CONDITION:  STABLE  DISCHARGED TO:  HOME

## 2012-06-21 ENCOUNTER — Telehealth: Payer: Self-pay

## 2012-06-21 ENCOUNTER — Encounter: Payer: Self-pay | Admitting: Family Medicine

## 2012-06-21 ENCOUNTER — Ambulatory Visit (INDEPENDENT_AMBULATORY_CARE_PROVIDER_SITE_OTHER): Payer: 59 | Admitting: Family Medicine

## 2012-06-21 VITALS — BP 104/66 | HR 95 | Temp 99.0°F | Wt 247.8 lb

## 2012-06-21 DIAGNOSIS — F341 Dysthymic disorder: Secondary | ICD-10-CM

## 2012-06-21 DIAGNOSIS — I10 Essential (primary) hypertension: Secondary | ICD-10-CM

## 2012-06-21 DIAGNOSIS — D509 Iron deficiency anemia, unspecified: Secondary | ICD-10-CM

## 2012-06-21 DIAGNOSIS — D649 Anemia, unspecified: Secondary | ICD-10-CM

## 2012-06-21 DIAGNOSIS — E119 Type 2 diabetes mellitus without complications: Secondary | ICD-10-CM

## 2012-06-21 LAB — CBC WITH DIFFERENTIAL/PLATELET
Basophils Absolute: 0 10*3/uL (ref 0.0–0.1)
Eosinophils Absolute: 0.2 10*3/uL (ref 0.0–0.7)
Hemoglobin: 11.3 g/dL — ABNORMAL LOW (ref 13.0–17.0)
Lymphocytes Relative: 27.3 % (ref 12.0–46.0)
Lymphs Abs: 1.5 10*3/uL (ref 0.7–4.0)
MCHC: 33.5 g/dL (ref 30.0–36.0)
Neutro Abs: 3.3 10*3/uL (ref 1.4–7.7)
Platelets: 377 10*3/uL (ref 150.0–400.0)
RDW: 12.8 % (ref 11.5–14.6)

## 2012-06-21 LAB — HEMOGLOBIN A1C: Hgb A1c MFr Bld: 6.2 % (ref 4.6–6.5)

## 2012-06-21 LAB — BASIC METABOLIC PANEL
BUN: 23 mg/dL (ref 6–23)
Creatinine, Ser: 1 mg/dL (ref 0.4–1.5)
GFR: 77.3 mL/min (ref >60.00)
Potassium: 4.6 mEq/L (ref 3.5–5.1)

## 2012-06-21 MED ORDER — PRAVASTATIN SODIUM 40 MG PO TABS: 40.0000 mg | ORAL_TABLET | Freq: Every day | ORAL | Status: DC

## 2012-06-21 MED ORDER — CARVEDILOL 25 MG PO TABS: 25.0000 mg | ORAL_TABLET | Freq: Two times a day (BID) | ORAL | Status: DC

## 2012-06-21 MED ORDER — PAROXETINE HCL 20 MG PO TABS: 20.0000 mg | ORAL_TABLET | Freq: Every day | ORAL | Status: DC

## 2012-06-21 NOTE — Progress Notes (Signed)
Diabetes:  Using medications without difficulties:yes Hypoglycemic episodes: no Hyperglycemic episodes: occ Feet problems: no Blood Sugars averaging: usually <150  Hypertension:    Using medication without problems or lightheadedness: yes, was only lightheaded after coming home from hospital, resolved now Chest pain with exertion:no Edema:no Short of breath:no  R TKR and in PT for this.  Off coumadin. Back on ASA.  Was transfused after surgery and needs f/u CBC.  ROM improving per patient.    Panic sx still controlled with paxil and tolerated well.  Mood and affect are normal.   PMH and SH reviewed  Meds, vitals, and allergies reviewed.   ROS: See HPI.  Otherwise negative.    GEN: nad, alert and oriented HEENT: mucous membranes moist NECK: supple w/o LA CV: rrr. PULM: ctab, no inc wob EXT: no edema BLE in compression stockings.

## 2012-06-21 NOTE — Assessment & Plan Note (Signed)
Fair control based on reported values, check A1c today and see notes on labs.  D/w pt about diet.  He's like had some postop variation in glucose.

## 2012-06-21 NOTE — Telephone Encounter (Signed)
He was profusely vomiting per report.  Advised ER.

## 2012-06-21 NOTE — Assessment & Plan Note (Signed)
With panic sx improved/controlled on meds.  Continue as is.

## 2012-06-21 NOTE — Assessment & Plan Note (Signed)
And was transfused after R TKR.  Will check CBC, see notes on labs.

## 2012-06-21 NOTE — Telephone Encounter (Signed)
Pt said starting at lunch felt dizzy and nausea, about one hour ago still dizzy, nausea and sweating profusely. No chest pain,abdominal pain, h/a,fever or SOB. Pt feels clammy to touch; BP at home now 124/71 pulse 92.[facility].

## 2012-06-21 NOTE — Assessment & Plan Note (Signed)
Controlled, would stop ACE for now given the lower BP and continue ARB in meantime.  BB for tachycardia, continue.  Tolerating meds.

## 2012-06-21 NOTE — Patient Instructions (Signed)
Recheck labs before a visit in 6 months.  30 min visit.  Take care.

## 2012-06-21 NOTE — Telephone Encounter (Signed)
Dr Para March said pt being cardiac patient,having recent surgery pt should go to ER for evaluation. Pt said feels "really bad if tries to get out of bed;pt started vomiting while talking with me and gave phone to pts son Josh.advised Josh pt needed to go toER and Sharia Reeve was babysitting his children and wife should be on her way home but if needed pt could go by ambulance. Josh asked me to call 911. I called 911 gave information to dispatcher and he said would send someone but if pts condition changed or worsened to have pts son call 911 immediately.

## 2012-06-22 ENCOUNTER — Other Ambulatory Visit: Payer: Self-pay | Admitting: Cardiology

## 2012-07-07 ENCOUNTER — Encounter (HOSPITAL_COMMUNITY): Payer: Self-pay

## 2012-08-15 NOTE — Addendum Note (Signed)
Addendum  created 08/15/12 0757 by Adair Laundry, CRNA   Modules edited:Anesthesia Events

## 2012-08-15 NOTE — Addendum Note (Signed)
Addendum  created 08/15/12 0758 by Adair Laundry, CRNA   Modules edited:Anesthesia Events

## 2012-08-30 ENCOUNTER — Other Ambulatory Visit: Payer: Self-pay | Admitting: Family Medicine

## 2012-09-09 DIAGNOSIS — Z5189 Encounter for other specified aftercare: Secondary | ICD-10-CM

## 2012-09-09 HISTORY — DX: Encounter for other specified aftercare: Z51.89

## 2012-09-12 ENCOUNTER — Ambulatory Visit (INDEPENDENT_AMBULATORY_CARE_PROVIDER_SITE_OTHER): Payer: 59 | Admitting: Cardiovascular Disease

## 2012-09-12 ENCOUNTER — Encounter: Payer: Self-pay | Admitting: Cardiovascular Disease

## 2012-09-12 VITALS — BP 98/62 | HR 88 | Ht 71.0 in | Wt 245.5 lb

## 2012-09-12 DIAGNOSIS — I509 Heart failure, unspecified: Secondary | ICD-10-CM

## 2012-09-12 DIAGNOSIS — Q210 Ventricular septal defect: Secondary | ICD-10-CM

## 2012-09-12 DIAGNOSIS — I10 Essential (primary) hypertension: Secondary | ICD-10-CM

## 2012-09-12 DIAGNOSIS — I5022 Chronic systolic (congestive) heart failure: Secondary | ICD-10-CM

## 2012-09-12 NOTE — Progress Notes (Signed)
HPI  This is a 61 year old man who is here today for a followup visit.  He has known history of nonischemic cardiomyopathy which was diagnosed in 2010. He had cardiac catheterization done which showed minor luminal irregularities without obstructive CAD. Ejection fraction was 35%. Most recent ejection fraction was 50-55% in may of this year . He underwent right knee replacement without complications. Overall, he has been doing well. Denies any chest pain or worsening dyspnea. No orthopnea, PND or lower extremity edema. He occasionally feels dizzy. His blood pressure tends to run on the low side.  Allergies  Allergen Reactions  . Metformin     REACTION: intolerant, h/o elevated Cr 2011     Current Outpatient Prescriptions on File Prior to Visit  Medication Sig Dispense Refill  . aspirin 81 MG tablet Take 81 mg by mouth 2 (two) times daily.      . carvedilol (COREG) 25 MG tablet Take 1 tablet (25 mg total) by mouth 2 (two) times daily with a meal.  180 tablet  3  . doxazosin (CARDURA) 1 MG tablet Take 1 mg by mouth at bedtime.      . fish oil-omega-3 fatty acids 1000 MG capsule Take 2 g by mouth daily.      Marland Kitchen glipiZIDE (GLUCOTROL XL) 5 MG 24 hr tablet Take 5 mg by mouth daily.      . Lancets (ONETOUCH ULTRASOFT) lancets USE DAILY AS NEEDED TO CHECK SUGAR, DX 250.00  100 each  3  . lansoprazole (PREVACID) 15 MG capsule Take 15 mg by mouth daily as needed. For acid reflux      . losartan (COZAAR) 100 MG tablet Take 50-100 mg by mouth 2 (two) times daily. 1 tab in the morning and 1/2 at night      . ONE TOUCH ULTRA TEST test strip CHECK BLOOD SUGAR DAILY AND IF BLOOD SUGAR FLUCTUATES. 250.00  100 each  3  . PARoxetine (PAXIL) 20 MG tablet Take 1 tablet (20 mg total) by mouth daily.  90 tablet  3  . pravastatin (PRAVACHOL) 40 MG tablet Take 1 tablet (40 mg total) by mouth at bedtime.  90 tablet  3  . spironolactone (ALDACTONE) 25 MG tablet Take 25 mg by mouth daily.         Past Medical  History  Diagnosis Date  . Diabetes mellitus 11/2006    Type II  . Hyperlipidemia 04/1998  . Hypertension 1995  . BPH (benign prostatic hypertrophy) 2004  . Sinus tachycardia     Pt says he has been told his heart rate is high since he was in high school.   . Holter monitor, abnormal 7/10    Frequent PAC's and PVC's  Average HR 96  (but he had started Toprol XL).  HR range 71-130  . Obesity   . Hip pain   . Cardiomyopathy     ? tachycardia-mediated  . Echocardiogram abnormal 7/10    Mild LVH with a mildly dilated LV.  EF was difficult to assess given poor acoustic windows but appeared moderately decreased.  Mild MR.  Probable small perimembranous VSD, mild LAE  . History of MRI 7/10    Cardiac MRI was done to followup difficult echo.  This showed mild to moderately dilated LV, mild to moderate LAE, EF  33% with global hypokinesis, normal RV size with mild systolic dysfunction.  There was no large VSD evident.  There was no myocardial delayed enhancement so no evidence for infiltrative  disease or infarction.  LHC (7/10) showed only luminal irreg. in the coronaries and EF 35%  . Abnormal echocardiogram 2/11    Repeated after med mgmt of cardiomyopathy showed EF 40-45%, mildly dilated LV, mild LV hypertrophy, anterolateral and apical hypokinesis, mild diastolic dysfunction.  . VSD (ventricular septal defect)     Probable small perimembranous  . Diverticulosis   . Depression   . Esophageal erosions   . OSA (obstructive sleep apnea)     Not using CPAP  . GERD (gastroesophageal reflux disease)   . Cancer     melanoma back  . Arthritis   . Anemia     hx  . Pneumonia     HX OF pna     Past Surgical History  Procedure Date  . Rotator cuff repair 2/11    rt  . Inguinal hernia repair 1985    Left  . Laminectomy 1987    with discectomy  . Inguinal hernia repair 1999    Right  . Total hip arthroplasty 2011    Left  . Esophagogastroduodenoscopy 2012    1)Barrett's esoph, 2)  Erosive esophagitis, 3) Mild gastritis in the antrum, 4) Duodenitis in the bulb of duodenum, 5) Small hiatal hernia  . Colonoscopy w/ biopsies 08/24/2006    Divertics, polyps x3, bx negative, repeat 2012 with mild diverticulosis in teh sigmoid colon and 1 polyp in tranverse colon, repeat due 2017  . Knee arthroscopy 10/15/2011    Procedure: ARTHROSCOPY KNEE;  Surgeon: Loreta Ave, MD;  Location: [facility];  Service: Orthopedics;  Laterality: Right;  right knee scope with lateral meniscectomy, removal loose foreign body, and microfracture technique  . Cardiac catheterization 05/30/2009    Dilated cardiomyopathy.  min VSD.  Hypokin Inf  wall.  Multiple min luminal Irr.  EF 35%  . Arthroplasty 06/01/2012    total knee   rt   . Total knee arthroplasty 06/01/2012    Procedure: TOTAL KNEE ARTHROPLASTY;  Surgeon: Loreta Ave, MD;  Location: [facility] OR;  Service: Orthopedics;  Laterality: Right;     Family History  Problem Relation Age of Onset  . Heart disease Father     CABG  . Stroke Father   . Diabetes Father   . Hypertension Mother   . Alzheimer's disease Mother   . Diabetes Sister   . Hypertension Sister   . Diabetes Brother   . Throat cancer Brother     Throat  . Diabetes Maternal Grandmother     Insulin  . Drug abuse Neg Hx   . Diabetes Sister   . Hypertension Sister   . Throat cancer Other     Throat  . Diabetes Other   . Alcohol abuse Other      History   Social History  . Marital Status: Single    Spouse Name: N/A    Number of Children: N/A  . 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 Smoking status: Former Smoker -- 20 years    Types: Cigarettes    Quit date: 11/09/1998  . Smokeless tobacco: Former Neurosurgeon    Quit date: 06/03/2007     Comment: no alcohol since nov 12  . Alcohol Use: Yes     Comment: Usually weekends  . Drug Use: No  . Sexually  Active: Not Currently   Other Topics Concern  . Not  on file   Social History Narrative   Lives in [place]        PHYSICAL EXAM   BP 98/62  Pulse 88  Ht 5\' 11"  (1.803 m)  Wt 245 lb 8 oz (111.358 kg)  BMI 34.24 kg/m2 Constitutional: He is oriented to person, place, and time. He appears well-developed and well-nourished. No distress.  HENT: No nasal discharge.  Head: Normocephalic and atraumatic.  Eyes: Pupils are equal and round. Right eye exhibits no discharge. Left eye exhibits no discharge.  Neck: Normal range of motion. Neck supple. No JVD present. No thyromegaly present.  Cardiovascular: Normal rate, regular rhythm, normal heart sounds and. Exam reveals no gallop and no friction rub. No murmur heard.  Pulmonary/Chest: Effort normal and breath sounds normal. No stridor. No respiratory distress. He has no wheezes. He has no rales. He exhibits no tenderness.  Abdominal: Soft. Bowel sounds are normal. He exhibits no distension. There is no tenderness. There is no rebound and no guarding.  Musculoskeletal: Normal range of motion. He exhibits no edema and no tenderness.  Neurological: He is alert and oriented to person, place, and time. Coordination normal.  Skin: Skin is warm and dry. No rash noted. He is not diaphoretic. No erythema. No pallor.  Psychiatric: He has a normal mood and affect. His behavior is normal. Judgment and thought content normal.       EKG: Sinus  Rhythm  - frequent ectopic ventricular beat s  # VECs = 2 -Right bundle branch block.    ASSESSMENT AND PLAN

## 2012-09-12 NOTE — Assessment & Plan Note (Signed)
He appears to be euvolemic and appears to be stable from a cardiac standpoint. Most recent ejection fraction was 50-55%.

## 2012-09-12 NOTE — Patient Instructions (Addendum)
Stop Amlodipine.  Continue to monitor blood pressure. If blood pressure is >130/90, you can resume Amlodipine 1/2 tablet once daily.  Follow up in 6 months.

## 2012-09-12 NOTE — Assessment & Plan Note (Signed)
His blood pressure is mildly load today. His home blood pressure readings overall are on the low side. Given that his ejection fraction is close to normal now, I don't think there is blood pressure that low. He is also on multiple antihypertensive medications. Thus, I will stop amlodipine today. I asked him to continue to monitor his blood pressure at home. His blood pressure goes above 130/90, he can resume  amlodipine but at a smaller dose of 2.5 mg daily.

## 2012-11-04 ENCOUNTER — Other Ambulatory Visit: Payer: Self-pay | Admitting: Cardiology

## 2012-11-12 ENCOUNTER — Other Ambulatory Visit: Payer: Self-pay | Admitting: Cardiology

## 2012-12-06 ENCOUNTER — Encounter: Payer: Self-pay | Admitting: Gastroenterology

## 2012-12-15 ENCOUNTER — Encounter: Payer: Self-pay | Admitting: Gastroenterology

## 2012-12-17 ENCOUNTER — Other Ambulatory Visit: Payer: Self-pay | Admitting: Family Medicine

## 2012-12-17 ENCOUNTER — Other Ambulatory Visit: Payer: Self-pay | Admitting: Cardiology

## 2012-12-19 ENCOUNTER — Other Ambulatory Visit: Payer: Self-pay | Admitting: *Deleted

## 2012-12-19 MED ORDER — LOSARTAN POTASSIUM 100 MG PO TABS: ORAL_TABLET | ORAL | Status: DC

## 2012-12-19 MED ORDER — SPIRONOLACTONE 25 MG PO TABS: ORAL_TABLET | ORAL | Status: DC

## 2012-12-19 MED ORDER — CARVEDILOL 25 MG PO TABS: 25.0000 mg | ORAL_TABLET | Freq: Two times a day (BID) | ORAL | Status: DC

## 2012-12-19 NOTE — Telephone Encounter (Signed)
Refilled Carvedilol, Spironolactone [facility]

## 2012-12-22 ENCOUNTER — Ambulatory Visit: Payer: 59 | Admitting: Family Medicine

## 2012-12-27 ENCOUNTER — Encounter: Payer: Self-pay | Admitting: Family Medicine

## 2012-12-27 ENCOUNTER — Ambulatory Visit (INDEPENDENT_AMBULATORY_CARE_PROVIDER_SITE_OTHER): Payer: 59 | Admitting: Family Medicine

## 2012-12-27 DIAGNOSIS — E119 Type 2 diabetes mellitus without complications: Secondary | ICD-10-CM

## 2012-12-27 LAB — COMPREHENSIVE METABOLIC PANEL
Albumin: 4.1 g/dL (ref 3.5–5.2)
Alkaline Phosphatase: 74 U/L (ref 39–117)
BUN: 22 mg/dL (ref 6–23)
Creatinine, Ser: 0.9 mg/dL (ref 0.4–1.5)
Glucose, Bld: 155 mg/dL — ABNORMAL HIGH (ref 70–99)
Potassium: 4.8 mEq/L (ref 3.5–5.1)
Total Bilirubin: 0.5 mg/dL (ref 0.3–1.2)

## 2012-12-27 LAB — LIPID PANEL
Cholesterol: 187 mg/dL (ref 0–200)
HDL: 40.9 mg/dL (ref >39.00)
LDL Cholesterol: 114 mg/dL — ABNORMAL HIGH (ref 0–99)
Total CHOL/HDL Ratio: 5
Triglycerides: 162 mg/dL — ABNORMAL HIGH (ref 0.0–149.0)
VLDL: 32.4 mg/dL (ref 0.0–40.0)

## 2012-12-27 MED ORDER — AMLODIPINE BESYLATE 5 MG PO TABS: 5.0000 mg | ORAL_TABLET | Freq: Every day | ORAL | Status: DC

## 2012-12-27 MED ORDER — DOXAZOSIN MESYLATE 1 MG PO TABS: 1.0000 mg | ORAL_TABLET | Freq: Every day | ORAL | Status: DC

## 2012-12-27 MED ORDER — GLIPIZIDE ER 5 MG PO TB24: 5.0000 mg | ORAL_TABLET | Freq: Every day | ORAL | Status: DC

## 2012-12-27 NOTE — Progress Notes (Signed)
He bowled at 254 last night.  He's much improved from an ortho standpoint.    Diabetes:  Using medications without difficulties:yes Hypoglycemic episodes: very rare (once <70) Hyperglycemic episodes:up to 300s after eating Feet problems:no Blood Sugars averaging: up to 175 in AM eye exam within last year: yes He had more stable numbers on the metformin.  He is currently off metformin.   Labs are pending.   PMH and SH reviewed  Meds, vitals, and allergies reviewed.   ROS: See HPI.  Otherwise negative.    GEN: nad, alert and oriented HEENT: mucous membranes moist NECK: supple w/o LA CV: rrr. PULM: ctab, no inc wob ABD: soft, +bs EXT: no edema SKIN: no acute rash  Diabetic foot exam: Normal inspection No skin breakdown No calluses  Normal DP pulses Normal sensation to light touch and monofilament Nails normal

## 2012-12-27 NOTE — Patient Instructions (Addendum)
Check with your insurance to see if they will cover the shingles shot. Go to the lab on the way out.  We'll contact you with your lab report.  Schedule a physical for about 6 months.  Take care.

## 2012-12-27 NOTE — Assessment & Plan Note (Signed)
Continue current meds for now.  Await labs.  See notes when resulted.  He may have a dawn effect.  tx will depend on Cr and A1c with the potential addition of metformin.  D/w pt.  He agrees.  We talked about diet and weight.  He's working on low carb diet. >25 min spent with face to face with patient, >50% counseling and/or coordinating care.

## 2012-12-30 ENCOUNTER — Ambulatory Visit (AMBULATORY_SURGERY_CENTER): Payer: 59 | Admitting: *Deleted

## 2012-12-30 ENCOUNTER — Encounter: Payer: Self-pay | Admitting: Gastroenterology

## 2012-12-30 VITALS — Ht 71.0 in | Wt 234.0 lb

## 2012-12-30 DIAGNOSIS — K2270 Barrett's esophagus without dysplasia: Secondary | ICD-10-CM

## 2013-01-12 ENCOUNTER — Other Ambulatory Visit: Payer: Self-pay | Admitting: Gastroenterology

## 2013-01-12 ENCOUNTER — Ambulatory Visit (AMBULATORY_SURGERY_CENTER): Payer: 59 | Admitting: Gastroenterology

## 2013-01-12 ENCOUNTER — Encounter: Payer: Self-pay | Admitting: Gastroenterology

## 2013-01-12 VITALS — BP 124/55 | HR 71 | Temp 97.0°F | Resp 15 | Ht 71.0 in | Wt 234.0 lb

## 2013-01-12 DIAGNOSIS — K2270 Barrett's esophagus without dysplasia: Secondary | ICD-10-CM

## 2013-01-12 LAB — GLUCOSE, CAPILLARY: Glucose-Capillary: 156 mg/dL — ABNORMAL HIGH (ref 70–99)

## 2013-01-12 MED ORDER — SODIUM CHLORIDE 0.9 % IV SOLN: 500.0000 mL | INTRAVENOUS | Status: DC

## 2013-01-12 MED ORDER — LANSOPRAZOLE 30 MG PO CPDR: 30.0000 mg | DELAYED_RELEASE_CAPSULE | Freq: Every day | ORAL | Status: DC

## 2013-01-12 NOTE — Progress Notes (Signed)
Patient did not experience any of the following events: a burn prior to discharge; a fall within the facility; wrong site/side/patient/procedure/implant event; or a hospital transfer or hospital admission upon discharge from the facility. (G8907) Patient did not have preoperative order for IV antibiotic SSI prophylaxis. (G8918)  

## 2013-01-12 NOTE — Progress Notes (Signed)
Stable to RR 

## 2013-01-12 NOTE — Patient Instructions (Addendum)
Impressions/Recommendations:  Barrett's Esophagus (handout given) Hiatal Hernia (handout given)  YOU HAD AN ENDOSCOPIC PROCEDURE TODAY AT THE [facility]: Refer to the procedure report that was given to you for any specific questions about what was found during the examination.  If the procedure report does not answer your questions, please call your gastroenterologist to clarify.  If you requested that your care partner not be given the details of your procedure findings, then the procedure report has been included in a sealed envelope for you to review at your convenience later.  YOU SHOULD EXPECT: Some feelings of bloating in the abdomen. Passage of more gas than usual.  Walking can help get rid of the air that was put into your GI tract during the procedure and reduce the bloating. If you had a lower endoscopy (such as a colonoscopy or flexible sigmoidoscopy) you may notice spotting of blood in your stool or on the toilet paper. If you underwent a bowel prep for your procedure, then you may not have a normal bowel movement for a few days.  DIET: Your first meal following the procedure should be a light meal and then it is ok to progress to your normal diet.  A half-sandwich or bowl of soup is an example of a good first meal.  Heavy or fried foods are harder to digest and may make you feel nauseous or bloated.  Likewise meals heavy in dairy and vegetables can cause extra gas to form and this can also increase the bloating.  Drink plenty of fluids but you should avoid alcoholic beverages for 24 hours.  ACTIVITY: Your care partner should take you home directly after the procedure.  You should plan to take it easy, moving slowly for the rest of the day.  You can resume normal activity the day after the procedure however you should NOT DRIVE or use heavy machinery for 24 hours (because of the sedation medicines used during the test).    SYMPTOMS TO REPORT IMMEDIATELY: A gastroenterologist  can be reached at any hour.  During normal business hours, 8:30 AM to 5:00 PM Monday through Friday, call [phone].  After hours and on weekends, please call the GI answering service at [phone] who will take a message and have the physician on call contact you.    Following upper endoscopy (EGD)  Vomiting of blood or coffee ground material  New chest pain or pain under the shoulder blades  Painful or persistently difficult swallowing  New shortness of breath  Fever of 100F or higher  Black, tarry-looking stools  FOLLOW UP: If any biopsies were taken you will be contacted by phone or by letter within the next 1-3 weeks.  Call your gastroenterologist if you have not heard about the biopsies in 3 weeks.  Our staff will call the home number listed on your records the next business day following your procedure to check on you and address any questions or concerns that you may have at that time regarding the information given to you following your procedure. This is a courtesy call and so if there is no answer at the home number and we have not heard from you through the emergency physician on call, we will assume that you have returned to your regular daily activities without incident.  SIGNATURES/CONFIDENTIALITY: You and/or your care partner have signed paperwork which will be entered into your electronic medical record.  These signatures attest to the fact that that the information above on your  After Visit Summary has been reviewed and is understood.  Full responsibility of the confidentiality of this discharge information lies with you and/or your care-partner.

## 2013-01-12 NOTE — Op Note (Signed)
[facility] [address] [place], [address]   ENDOSCOPY PROCEDURE REPORT  PATIENT: Tim, Mccullough  MR#: [id number] BIRTHDATE: 1951-11-05 , 62  yrs. old GENDER: Male ENDOSCOPIST: Meryl Dare, MD, [facility] PROCEDURE DATE:  01/12/2013 PROCEDURE:  EGD w/ biopsy ASA CLASS:     Class II INDICATIONS:  follow up of Barrett's esophagus. MEDICATIONS: MAC sedation, administered by CRNA and propofol (Diprivan) 100mg  IV TOPICAL ANESTHETIC: Cetacaine Spray DESCRIPTION OF PROCEDURE: After the risks benefits and alternatives of the procedure were thoroughly explained, informed consent was obtained.  The LB GIF-H180 [id number] endoscope was introduced through the mouth and advanced to the second portion of the duodenum without limitations.  The instrument was slowly withdrawn as the mucosa was fully examined.  ESOPHAGUS: There was evidence of Barrett's esophagus in the lower third of the esophagus measuring 7 cm in length. Multiple random biopsies were performed using cold forceps.   The esophagus was otherwise normal. STOMACH: The mucosa and folds of the stomach appeared normal. DUODENUM: The duodenal mucosa showed no abnormalities.  Retroflexed views revealed a small hiatal hernia.   The scope was then withdrawn from the patient and the procedure completed.  COMPLICATIONS: There were no complications.  ENDOSCOPIC IMPRESSION: 1.   Barrett's esophagus; multiple biopsies 2.   Small hiatal hernia  RECOMMENDATIONS: 1.  Anti-reflux regimen 2.  Await pathology results 3.  Continue PPI long term 4.  Endoscopyin 3 years if no dysplasia    eSigned:  Meryl Dare, MD, [facility] 01/12/2013 9:24 AM

## 2013-01-13 ENCOUNTER — Telehealth: Payer: Self-pay

## 2013-01-13 NOTE — Telephone Encounter (Signed)
  Follow up Call-  Call back number 01/12/2013  Post procedure Call Back phone  # [phone]  Permission to leave phone message Yes     Patient questions:  Do you have a fever, pain , or abdominal swelling? no Pain Score  0 *  Have you tolerated food without any problems? yes  Have you been able to return to your normal activities? yes  Do you have any questions about your discharge instructions: Diet   no Medications  no Follow up visit  no  Do you have questions or concerns about your Care? no  Actions: * If pain score is 4 or above: No action needed, pain <4.

## 2013-01-14 ENCOUNTER — Other Ambulatory Visit: Payer: Self-pay | Admitting: Family Medicine

## 2013-01-17 ENCOUNTER — Encounter: Payer: Self-pay | Admitting: Family Medicine

## 2013-01-17 ENCOUNTER — Encounter: Payer: Self-pay | Admitting: Gastroenterology

## 2013-01-17 DIAGNOSIS — K2270 Barrett's esophagus without dysplasia: Secondary | ICD-10-CM

## 2013-01-19 ENCOUNTER — Other Ambulatory Visit: Payer: Self-pay | Admitting: Family Medicine

## 2013-01-23 NOTE — Addendum Note (Signed)
Addendum created 01/23/13 0729 by Edmonia Caprio, CRNA   Modules edited: Anesthesia Events

## 2013-02-02 ENCOUNTER — Telehealth: Payer: Self-pay | Admitting: *Deleted

## 2013-02-02 NOTE — Telephone Encounter (Signed)
LMOVM in detail.  Appts already scheduled for August.

## 2013-02-02 NOTE — Telephone Encounter (Signed)
Patient was in the office today with his son and said he has not heard anything from his lab results back in February.  I looked it up and your comment was that you would discuss it at his OV.  He doesn't have an upcoming OV until August.  I gave him a copy of his lab results.  Would you please comment on them and I will phone him with your comments.

## 2013-02-02 NOTE — Telephone Encounter (Signed)
This was apparently my mistake when I didn't route the results back to notify the patient.  I apologize.  I did review the results previously.   His A1c and lipids are okay for now, I would recheck them in ~5 months before the August OV.  If we need to add back the metformin, we can do so.  His renal function would allow this.  It isn't mandatory to add back on with an A1c of 7.1.   I would continue as is for now unless he is having significant and frequent elevations in his sugar.   Again, I apologize.  Thanks.

## 2013-02-17 ENCOUNTER — Encounter: Payer: Self-pay | Admitting: Family Medicine

## 2013-02-17 ENCOUNTER — Ambulatory Visit (INDEPENDENT_AMBULATORY_CARE_PROVIDER_SITE_OTHER): Payer: 59 | Admitting: Family Medicine

## 2013-02-17 VITALS — BP 102/72 | HR 78 | Temp 98.0°F | Wt 240.0 lb

## 2013-02-17 DIAGNOSIS — H1130 Conjunctival hemorrhage, unspecified eye: Secondary | ICD-10-CM

## 2013-02-17 DIAGNOSIS — H1131 Conjunctival hemorrhage, right eye: Secondary | ICD-10-CM

## 2013-02-17 NOTE — Progress Notes (Signed)
  Subjective:    Patient ID: Tim Mccullough, male    DOB: 02-25-1951, 62 y.o.   MRN: [id number]  HPI 62 year old male with history of tobacco abuse and HTN presents following red eye x 1 week.  He reports noting 1 week ago small area in inner corner of eye... Blood color redness. Gradually spreading to rest of eye.  No pain, occ slight stinging. NO vision change.  Slight headache yesterday. None today.  No foreign body sensation. No injury to it. Normal BP at home.  He is retired. Has had conjunctival hemmorage in past... Occurs once a year.  Sees [facility] in [place], last eye exam within the last year.    Review of Systems  Constitutional: Negative for fever and fatigue.  HENT: Negative for nosebleeds.   Eyes: Negative for pain.  Respiratory: Negative for shortness of breath.   Cardiovascular: Negative for chest pain.       Objective:   Physical Exam  Constitutional: He appears well-developed and well-nourished.  HENT:  Head: Normocephalic.  Right Ear: External ear normal.  Left Ear: External ear normal.  Nose: Nose normal.  Mouth/Throat: Oropharynx is clear and moist.  Eyes: EOM are normal. Pupils are equal, round, and reactive to light. Right eye exhibits no discharge. Left eye exhibits no discharge. Right conjunctiva has a hemorrhage. Left conjunctiva has no hemorrhage.  Fundoscopic exam:      The right eye shows no AV nicking, no hemorrhage and no papilledema.       The left eye shows no AV nicking, no hemorrhage and no papilledema.  Neck: Normal range of motion. Neck supple.  Cardiovascular: Normal rate.   No murmur heard. Pulmonary/Chest: Effort normal and breath sounds normal.  Abdominal: Soft. Bowel sounds are normal. There is no tenderness.          Assessment & Plan:

## 2013-02-17 NOTE — Assessment & Plan Note (Signed)
More extensive likely due to pt on asa.  No red flags for red eye. Hold aspirin.  If not gradually improving with time, or worsening... See eye MD for full slit lamp exam.

## 2013-02-17 NOTE — Patient Instructions (Signed)
Conjunctival hemorrhage. Hold aspirin x 1 week. If continuing to progress or eye pain... See eye MD for full slit lamp exam.

## 2013-04-04 ENCOUNTER — Ambulatory Visit: Payer: 59 | Admitting: Cardiovascular Disease

## 2013-04-07 ENCOUNTER — Encounter: Payer: Self-pay | Admitting: Cardiovascular Disease

## 2013-04-07 ENCOUNTER — Ambulatory Visit (INDEPENDENT_AMBULATORY_CARE_PROVIDER_SITE_OTHER): Payer: 59 | Admitting: Cardiovascular Disease

## 2013-04-07 VITALS — BP 82/54 | HR 85 | Ht 70.0 in | Wt 239.5 lb

## 2013-04-07 DIAGNOSIS — I10 Essential (primary) hypertension: Secondary | ICD-10-CM

## 2013-04-07 DIAGNOSIS — I509 Heart failure, unspecified: Secondary | ICD-10-CM

## 2013-04-07 DIAGNOSIS — I5022 Chronic systolic (congestive) heart failure: Secondary | ICD-10-CM

## 2013-04-07 NOTE — Progress Notes (Signed)
HPI  This is a 62 year old man who is here today for a followup visit.  He has known history of nonischemic cardiomyopathy which was diagnosed in 2010. He had cardiac catheterization done which showed minor luminal irregularities without obstructive CAD. Ejection fraction was 35%. Most recent ejection fraction was 50-55% in may of  2013 . Overall, he has been doing well. Denies any chest pain or worsening dyspnea. No orthopnea, PND or lower extremity edema. He has been feeling tired with occasional dizziness. During last visit, I stopped amlodipine. However, it seems that he got the medication again from pharmacy and he is taking once daily. He is also on both lisinopril and losartan.  Allergies  Allergen Reactions  . Metformin     REACTION: intolerant, h/o elevated Cr 2011     Current Outpatient Prescriptions on File Prior to Visit  Medication Sig Dispense Refill  . amLODipine (NORVASC) 5 MG tablet Take 1 tablet (5 mg total) by mouth daily.  30 tablet  12  . aspirin 81 MG tablet Take 81 mg by mouth 2 (two) times daily.      . carvedilol (COREG) 25 MG tablet Take 1 tablet (25 mg total) by mouth 2 (two) times daily with a meal.  180 tablet  3  . doxazosin (CARDURA) 1 MG tablet Take 1 tablet (1 mg total) by mouth at bedtime.  30 tablet  12  . fish oil-omega-3 fatty acids 1000 MG capsule Take 2 g by mouth daily.      Marland Kitchen glipiZIDE (GLUCOTROL XL) 5 MG 24 hr tablet Take 1 tablet (5 mg total) by mouth daily.  30 tablet  12  . Lancets (ONETOUCH ULTRASOFT) lancets USE DAILY AS NEEDED TO CHECK SUGAR, DX 250.00  100 each  3  . lansoprazole (PREVACID) 30 MG capsule Take 1 capsule (30 mg total) by mouth daily.  90 capsule  3  . lisinopril (PRINIVIL,ZESTRIL) 2.5 MG tablet Take 2.5 mg by mouth daily.      Marland Kitchen losartan (COZAAR) 100 MG tablet TAKE 1 TABLET BY MOUTH EVERY DAY AND 1/2 TABLET AT NIGHT  45 tablet  6  . ONE TOUCH ULTRA TEST test strip CHECK BLOOD SUGAR DAILY AND IF BLOOD SUGAR FLUCTUATES. 250.00   100 each  3  . PARoxetine (PAXIL) 20 MG tablet Take 1 tablet (20 mg total) by mouth daily.  90 tablet  3  . pravastatin (PRAVACHOL) 40 MG tablet Take 1 tablet (40 mg total) by mouth at bedtime.  90 tablet  3  . spironolactone (ALDACTONE) 25 MG tablet TAKE 1 TABLET BY MOUTH EVERY DAY  30 tablet  6   No current facility-administered medications on file prior to visit.     Past Medical History  Diagnosis Date  . Diabetes mellitus 11/2006    Type II  . Hyperlipidemia 04/1998  . Hypertension 1995  . BPH (benign prostatic hypertrophy) 2004  . Sinus tachycardia     Pt says he has been told his heart rate is high since he was in high school.   . Holter monitor, abnormal 7/10    Frequent PAC's and PVC's  Average HR 96  (but he had started Toprol XL).  HR range 71-130  . Obesity   . Hip pain   . Cardiomyopathy     ? tachycardia-mediated  . Echocardiogram abnormal 7/10    Mild LVH with a mildly dilated LV.  EF was difficult to assess given poor acoustic windows but appeared moderately decreased.  Mild MR.  Probable small perimembranous VSD, mild LAE  . History of MRI 7/10    Cardiac MRI was done to followup difficult echo.  This showed mild to moderately dilated LV, mild to moderate LAE, EF  33% with global hypokinesis, normal RV size with mild systolic dysfunction.  There was no large VSD evident.  There was no myocardial delayed enhancement so no evidence for infiltrative disease or infarction.  LHC (7/10) showed only luminal irreg. in the coronaries and EF 35%  . Abnormal echocardiogram 2/11    Repeated after med mgmt of cardiomyopathy showed EF 40-45%, mildly dilated LV, mild LV hypertrophy, anterolateral and apical hypokinesis, mild diastolic dysfunction.  . VSD (ventricular septal defect)     Probable small perimembranous  . Diverticulosis   . Depression   . Esophageal erosions   . OSA (obstructive sleep apnea)     Not using CPAP  . GERD (gastroesophageal reflux disease)   . Cancer       melanoma back  . Arthritis   . Anemia     hx  . Pneumonia     HX OF pna  . Blood transfusion without reported diagnosis 09/2012     Past Surgical History  Procedure Laterality Date  . Rotator cuff repair  2/11    rt  . Inguinal hernia repair  1985    Left  . Laminectomy  1987    with discectomy  . Inguinal hernia repair  1999    Right  . Total hip arthroplasty  2011    Left  . Esophagogastroduodenoscopy  2012    1)Barrett's esoph, 2) Erosive esophagitis, 3) Mild gastritis in the antrum, 4) Duodenitis in the bulb of duodenum, 5) Small hiatal hernia  . Colonoscopy w/ biopsies  08/24/2006    Divertics, polyps x3, bx negative, repeat 2012 with mild diverticulosis in teh sigmoid colon and 1 polyp in tranverse colon, repeat due 2017  . Knee arthroscopy  10/15/2011    Procedure: ARTHROSCOPY KNEE;  Surgeon: Loreta Ave, MD;  Location: [facility];  Service: Orthopedics;  Laterality: Right;  right knee scope with lateral meniscectomy, removal loose foreign body, and microfracture technique  . Cardiac catheterization  05/30/2009    Dilated cardiomyopathy.  min VSD.  Hypokin Inf  wall.  Multiple min luminal Irr.  EF 35%  . Total knee arthroplasty  06/01/2012    Procedure: TOTAL KNEE ARTHROPLASTY;  Surgeon: Loreta Ave, MD;  Location: [facility] OR;  Service: Orthopedics;  Laterality: Right;     Family History  Problem Relation Age of Onset  . Heart disease Father     CABG  . Stroke Father   . Diabetes Father   . Hypertension Mother   . Alzheimer's disease Mother   . Diabetes Sister   . Hypertension Sister   . Diabetes Brother   . Throat cancer Brother     Throat  . Diabetes Maternal Grandmother     Insulin  . Drug abuse Neg Hx   . Diabetes Sister   . Hypertension Sister   . Throat cancer Other     Throat  . Diabetes Other   . Alcohol abuse Other      History   Social History  . Marital Status: Single    Spouse Name: N/A    Number of Children: N/A   . 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 Smoking status: Former Smoker -- 20 years    Types: Cigarettes    Quit date: 11/09/1998  . Smokeless tobacco: Former Neurosurgeon    Quit date: 06/03/2007     Comment: no alcohol since nov 12  . Alcohol Use: No     Comment: sober as 09/2011  . Drug Use: No  . Sexually Active: Not Currently   Other Topics Concern  . Not on file   Social History Narrative   Lives in [place]   Divorced        PHYSICAL EXAM   BP 82/54  Pulse 85  Ht 5\' 10"  (1.778 m)  Wt 239 lb 8 oz (108.636 kg)  BMI 34.36 kg/m2 Constitutional: He is oriented to person, place, and time. He appears well-developed and well-nourished. No distress.  HENT: No nasal discharge.  Head: Normocephalic and atraumatic.  Eyes: Pupils are equal and round. Right eye exhibits no discharge. Left eye exhibits no discharge.  Neck: Normal range of motion. Neck supple. No JVD present. No thyromegaly present.  Cardiovascular: Normal rate, regular rhythm, normal heart sounds and. Exam reveals no gallop and no friction rub. No murmur heard.  Pulmonary/Chest: Effort normal and breath sounds normal. No stridor. No respiratory distress. He has no wheezes. He has no rales. He exhibits no tenderness.  Abdominal: Soft. Bowel sounds are normal. He exhibits no distension. There is no tenderness. There is no rebound and no guarding.  Musculoskeletal: Normal range of motion. He exhibits no edema and no tenderness.  Neurological: He is alert and oriented to person, place, and time. Coordination normal.  Skin: Skin is warm and dry. No rash noted. He is not diaphoretic. No erythema. No pallor.  Psychiatric: He has a normal mood and affect. His behavior is normal. Judgment and thought content normal.       EKG: Normal sinus rhythm with PACs. Right bundle branch block.   ASSESSMENT AND PLAN

## 2013-04-07 NOTE — Assessment & Plan Note (Signed)
Blood pressure is running low. He was supposed to be off amlodipine but still taking it. Both amlodipine and lisinopril were stopped today.

## 2013-04-07 NOTE — Assessment & Plan Note (Signed)
He is currently New York Heart Association class II. Most recent ejection fraction was 50-55% in May of 2013. Given that his blood pressure continues to run low, I again advised him to stop amlodipine and also lisinopril given that she is already on losartan. I will check basic metabolic profile given that he is on spironolactone.

## 2013-04-07 NOTE — Patient Instructions (Addendum)
Stop Amlodipine.  Stop Lisinopril .  Continue other medications.  Labs today.   Follow up in 6 months.

## 2013-04-08 LAB — BASIC METABOLIC PANEL
BUN/Creatinine Ratio: 25 — ABNORMAL HIGH (ref 10–22)
BUN: 27 mg/dL (ref 8–27)
CO2: 20 mmol/L (ref 19–28)
Creatinine, Ser: 1.1 mg/dL (ref 0.76–1.27)
GFR calc Af Amer: 83 mL/min/{1.73_m2} (ref >59)
GFR calc non Af Amer: 72 mL/min/{1.73_m2} (ref >59)
Sodium: 136 mmol/L (ref 134–144)

## 2013-04-11 ENCOUNTER — Other Ambulatory Visit: Payer: Self-pay | Admitting: Family Medicine

## 2013-04-18 ENCOUNTER — Other Ambulatory Visit: Payer: Self-pay | Admitting: Family Medicine

## 2013-04-18 ENCOUNTER — Encounter: Payer: Self-pay | Admitting: Family Medicine

## 2013-04-18 DIAGNOSIS — E119 Type 2 diabetes mellitus without complications: Secondary | ICD-10-CM

## 2013-04-19 ENCOUNTER — Other Ambulatory Visit (INDEPENDENT_AMBULATORY_CARE_PROVIDER_SITE_OTHER): Payer: 59

## 2013-04-19 DIAGNOSIS — E119 Type 2 diabetes mellitus without complications: Secondary | ICD-10-CM

## 2013-04-19 LAB — HEMOGLOBIN A1C: Hgb A1c MFr Bld: 7.3 % — ABNORMAL HIGH (ref 4.6–6.5)

## 2013-04-24 ENCOUNTER — Ambulatory Visit: Payer: 59 | Admitting: Family Medicine

## 2013-04-27 ENCOUNTER — Ambulatory Visit (INDEPENDENT_AMBULATORY_CARE_PROVIDER_SITE_OTHER): Payer: 59 | Admitting: Family Medicine

## 2013-04-27 ENCOUNTER — Encounter: Payer: Self-pay | Admitting: Family Medicine

## 2013-04-27 VITALS — BP 110/74 | HR 87 | Temp 98.3°F | Wt 240.5 lb

## 2013-04-27 DIAGNOSIS — E119 Type 2 diabetes mellitus without complications: Secondary | ICD-10-CM

## 2013-04-27 MED ORDER — METFORMIN HCL 500 MG PO TABS: ORAL_TABLET | ORAL | Status: DC

## 2013-04-27 NOTE — Patient Instructions (Addendum)
Stop the glipizide.   Restart metformin.  1 day for 1 week, then 2 a day, then 2+1, then 2+2 per day.  If diarrhea or your sugars are controlled, then you can cut the dose back.  Recheck A1c in about 4 months.   I would try taking a snack before bed.

## 2013-04-27 NOTE — Progress Notes (Signed)
Diabetes:  Using medications without difficulties: Hypoglycemic episodes:rare Hyperglycemic episodes: as high as 400, see prev lab notes.   Feet problems:no Blood Sugars averaging: ~200 in AM, before or after eating.   eye exam within last year:yes Sugar had been higher recently.  A1c up slightly Off metformin prev when his Cr was up.  Discussed.  We may be able to restart.   Meds, vitals, and allergies reviewed.   ROS: See HPI.  Otherwise negative.    GEN: nad, alert and oriented HEENT: mucous membranes moist NECK: supple w/o LA CV: rrr. PULM: ctab, no inc wob ABD: soft, +bs EXT: no edema SKIN: no acute rash  Diabetic foot exam: Normal inspection No skin breakdown No calluses  Normal DP pulses Normal sensation to light touch and monofilament Nails normal

## 2013-04-28 NOTE — Assessment & Plan Note (Signed)
Stop glipizide, restart metformin since Cr is wnl.  He had done better with less fluctuation on metformin.  Continue work on diet and weight.  Recheck later in 2014.  He agrees. Labs discussed.

## 2013-05-18 ENCOUNTER — Other Ambulatory Visit: Payer: Self-pay

## 2013-06-12 ENCOUNTER — Other Ambulatory Visit: Payer: Self-pay | Admitting: Family Medicine

## 2013-06-20 ENCOUNTER — Other Ambulatory Visit: Payer: 59

## 2013-06-27 ENCOUNTER — Encounter: Payer: 59 | Admitting: Family Medicine

## 2013-07-09 ENCOUNTER — Other Ambulatory Visit: Payer: Self-pay | Admitting: Cardiovascular Disease

## 2013-07-12 ENCOUNTER — Other Ambulatory Visit: Payer: Self-pay | Admitting: Family Medicine

## 2013-07-22 ENCOUNTER — Other Ambulatory Visit: Payer: Self-pay | Admitting: Family Medicine

## 2013-07-22 DIAGNOSIS — D509 Iron deficiency anemia, unspecified: Secondary | ICD-10-CM

## 2013-07-22 DIAGNOSIS — E119 Type 2 diabetes mellitus without complications: Secondary | ICD-10-CM

## 2013-07-24 ENCOUNTER — Other Ambulatory Visit: Payer: Self-pay | Admitting: Cardiovascular Disease

## 2013-07-24 ENCOUNTER — Other Ambulatory Visit (INDEPENDENT_AMBULATORY_CARE_PROVIDER_SITE_OTHER): Payer: 59

## 2013-07-24 DIAGNOSIS — D509 Iron deficiency anemia, unspecified: Secondary | ICD-10-CM

## 2013-07-24 DIAGNOSIS — E119 Type 2 diabetes mellitus without complications: Secondary | ICD-10-CM

## 2013-07-24 LAB — CBC WITH DIFFERENTIAL/PLATELET
Basophils Absolute: 0 10*3/uL (ref 0.0–0.1)
Basophils Relative: 0.4 % (ref 0.0–3.0)
Eosinophils Absolute: 0.2 10*3/uL (ref 0.0–0.7)
Hemoglobin: 13.5 g/dL (ref 13.0–17.0)
Lymphocytes Relative: 27.5 % (ref 12.0–46.0)
MCHC: 34.2 g/dL (ref 30.0–36.0)
MCV: 90.4 fl (ref 78.0–100.0)
Monocytes Absolute: 0.7 10*3/uL (ref 0.1–1.0)
Neutro Abs: 5.3 10*3/uL (ref 1.4–7.7)
Neutrophils Relative %: 62.1 % (ref 43.0–77.0)
RDW: 13.1 % (ref 11.5–14.6)

## 2013-07-24 LAB — BASIC METABOLIC PANEL
CO2: 29 mEq/L (ref 19–32)
Calcium: 9.3 mg/dL (ref 8.4–10.5)
Chloride: 103 mEq/L (ref 96–112)
Creatinine, Ser: 1.1 mg/dL (ref 0.4–1.5)
Glucose, Bld: 191 mg/dL — ABNORMAL HIGH (ref 70–99)

## 2013-07-24 LAB — LIPID PANEL
HDL: 39.3 mg/dL (ref >39.00)
Total CHOL/HDL Ratio: 4

## 2013-07-28 ENCOUNTER — Encounter: Payer: Self-pay | Admitting: Family Medicine

## 2013-07-28 ENCOUNTER — Ambulatory Visit (INDEPENDENT_AMBULATORY_CARE_PROVIDER_SITE_OTHER): Payer: 59 | Admitting: Family Medicine

## 2013-07-28 VITALS — BP 122/68 | HR 75 | Temp 98.3°F | Ht 70.0 in | Wt 241.5 lb

## 2013-07-28 DIAGNOSIS — E119 Type 2 diabetes mellitus without complications: Secondary | ICD-10-CM

## 2013-07-28 DIAGNOSIS — Z0000 Encounter for general adult medical examination without abnormal findings: Secondary | ICD-10-CM

## 2013-07-28 DIAGNOSIS — F341 Dysthymic disorder: Secondary | ICD-10-CM

## 2013-07-28 DIAGNOSIS — I10 Essential (primary) hypertension: Secondary | ICD-10-CM

## 2013-07-28 DIAGNOSIS — E785 Hyperlipidemia, unspecified: Secondary | ICD-10-CM

## 2013-07-28 DIAGNOSIS — D509 Iron deficiency anemia, unspecified: Secondary | ICD-10-CM

## 2013-07-28 DIAGNOSIS — Z23 Encounter for immunization: Secondary | ICD-10-CM

## 2013-07-28 NOTE — Progress Notes (Signed)
CPE- See plan.  Routine anticipatory guidance given to patient.  See health maintenance. Colonoscopy 2012 Tetanus 2014 PNA 2013 Shingles shot encouraged.  Flu shot today.  Prostate cancer screening and PSA options (with potential risks and benefits of testing vs not testing) were discussed along with recent recs/guidelines.  He declined testing PSA at this point.  He is still on cardura w/o LUTS in the meantime.  He can consider this and we can discuss in the future if needed.  Diet and exercise d/w pt.  He is staying away from sweets.  Bowling and yardwork mainly.  Encouraged to do it regularly.   Living will encouraged. Encouraged.  If incapacitated, would have his son Walt [employer].    Diabetes:  Using medications without difficulties: yes Hypoglycemic episodes: no Hyperglycemic episodes: no Feet problems:no Blood Sugars averaging: ~90-160 in AM eye exam within last year: yes, due again soon, discussed.   Hypertension:  Using medication without problems: yes, rarely lightheaded with sudden position changes Chest pain with exertion:no Edema:no Short of breath:no  Elevated Cholesterol: Using medications without problems: yes Muscle aches: no Diet compliance: discussed Exercise: discussed   PMH and SH reviewed.   Vital signs, Meds and allergies reviewed.  ROS: See HPI.  Otherwise nontributory.   GEN: nad, alert and oriented HEENT: mucous membranes moist NECK: supple w/o LA CV: rrr.  no murmur PULM: ctab, no inc wob ABD: soft, +bs EXT: no edema SKIN: no acute rash  Diabetic foot exam: Normal inspection No skin breakdown No calluses  Normal DP pulses Normal sensation to light tough and monofilament Nails normal

## 2013-07-28 NOTE — Patient Instructions (Addendum)
Check with your insurance to see if they will cover the shingles shot. Recheck A1c in about 3 months before a visit.  Take care.  Work on your [employer] in the meantime.

## 2013-07-30 DIAGNOSIS — Z0000 Encounter for general adult medical examination without abnormal findings: Secondary | ICD-10-CM | POA: Insufficient documentation

## 2013-07-30 NOTE — Assessment & Plan Note (Signed)
Resolved

## 2013-07-30 NOTE — Assessment & Plan Note (Signed)
Continue current meds, he'll work on diet. Labs discussed.

## 2013-07-30 NOTE — Assessment & Plan Note (Signed)
Labs d/w pt.  He wants to avoid more meds.  Work on diet and weight, recheck as planned.  He agrees.

## 2013-07-30 NOTE — Assessment & Plan Note (Signed)
Mood good, continue as is.

## 2013-07-30 NOTE — Assessment & Plan Note (Signed)
Controlled, continue current meds.   

## 2013-07-30 NOTE — Assessment & Plan Note (Signed)
Routine anticipatory guidance given to patient.  See health maintenance. Colonoscopy 2012 Tetanus 2014 PNA 2013 Shingles shot encouraged.  Flu shot today.  Prostate cancer screening and PSA options (with potential risks and benefits of testing vs not testing) were discussed along with recent recs/guidelines.  He declined testing PSA at this point.  He is still on cardura w/o LUTS in the meantime.  He can consider this and we can discuss in the future if needed.  Diet and exercise d/w pt.  He is staying away from sweets.  Bowling and yardwork mainly.  Encouraged to do it regularly.   Living will encouraged. Encouraged.  If incapacitated, would have his son [employer].

## 2013-10-09 ENCOUNTER — Encounter (INDEPENDENT_AMBULATORY_CARE_PROVIDER_SITE_OTHER): Payer: Self-pay

## 2013-10-09 ENCOUNTER — Encounter: Payer: Self-pay | Admitting: Cardiovascular Disease

## 2013-10-09 ENCOUNTER — Ambulatory Visit (INDEPENDENT_AMBULATORY_CARE_PROVIDER_SITE_OTHER): Payer: 59 | Admitting: Cardiovascular Disease

## 2013-10-09 VITALS — BP 114/60 | HR 84 | Ht 70.0 in | Wt 242.5 lb

## 2013-10-09 DIAGNOSIS — E785 Hyperlipidemia, unspecified: Secondary | ICD-10-CM

## 2013-10-09 DIAGNOSIS — I10 Essential (primary) hypertension: Secondary | ICD-10-CM

## 2013-10-09 DIAGNOSIS — I5022 Chronic systolic (congestive) heart failure: Secondary | ICD-10-CM

## 2013-10-09 DIAGNOSIS — I509 Heart failure, unspecified: Secondary | ICD-10-CM

## 2013-10-09 NOTE — Progress Notes (Signed)
HPI  This is a 62 year old man who is here today for a followup visit.  He has known history of nonischemic cardiomyopathy which was diagnosed in 2010. He had cardiac catheterization done which showed minor luminal irregularities without obstructive CAD. Ejection fraction was 35%. Most recent ejection fraction was 50-55% in may of  2013 . Overall, he has been doing well. Denies any chest pain or worsening dyspnea. No orthopnea, PND or lower extremity edema. During last visit, he was noted to be hypotensive and thus I stopped both lisinopril and amlodipine. He continues to take carvedilol, losartan and spironolactone without any side effects. He has been working on controlling his diabetes.  No Known Allergies   Current Outpatient Prescriptions on File Prior to Visit  Medication Sig Dispense Refill  . aspirin 81 MG tablet Take 81 mg by mouth 2 (two) times daily.      . carvedilol (COREG) 25 MG tablet TAKE 1 TABLET (25 MG TOTAL) BY MOUTH 2 (TWO) TIMES DAILY WITH A MEAL.  180 tablet  1  . doxazosin (CARDURA) 1 MG tablet Take 1 tablet (1 mg total) by mouth at bedtime.  30 tablet  12  . fish oil-omega-3 fatty acids 1000 MG capsule Take 1 g by mouth 2 (two) times daily.       . Lancets (ONETOUCH ULTRASOFT) lancets USE DAILY AS NEEDED TO CHECK SUGAR, DX 250.00  100 each  3  . lansoprazole (PREVACID) 30 MG capsule Take 1 capsule (30 mg total) by mouth daily.  90 capsule  3  . losartan (COZAAR) 100 MG tablet TAKE 1 TABLET BY MOUTH EVERY DAY AND 1/2 TABLET AT NIGHT  45 tablet  6  . metFORMIN (GLUCOPHAGE) 500 MG tablet Take 2 tabs twice a day      . ONE TOUCH ULTRA TEST test strip CHECK BLOOD SUGAR DAILY AND IF BLOOD SUGAR FLUCTUATES. 250.00  100 each  3  . PARoxetine (PAXIL) 20 MG tablet TAKE 1 TABLET (20 MG TOTAL) BY MOUTH DAILY.  90 tablet  0  . pravastatin (PRAVACHOL) 40 MG tablet TAKE 1 TABLET (40 MG TOTAL) BY MOUTH AT BEDTIME.  90 tablet  3  . spironolactone (ALDACTONE) 25 MG tablet TAKE 1  TABLET BY MOUTH EVERY DAY  30 tablet  6   No current facility-administered medications on file prior to visit.     Past Medical History  Diagnosis Date  . Diabetes mellitus 11/2006    Type II  . Hyperlipidemia 04/1998  . Hypertension 1995  . BPH (benign prostatic hypertrophy) 2004  . Sinus tachycardia     Pt says he has been told his heart rate is high since he was in high school.   . Holter monitor, abnormal 7/10    Frequent PAC's and PVC's  Average HR 96  (but he had started Toprol XL).  HR range 71-130  . Obesity   . Hip pain   . Cardiomyopathy     ? tachycardia-mediated  . Echocardiogram abnormal 7/10    Mild LVH with a mildly dilated LV.  EF was difficult to assess given poor acoustic windows but appeared moderately decreased.  Mild MR.  Probable small perimembranous VSD, mild LAE  . History of MRI 7/10    Cardiac MRI was done to followup difficult echo.  This showed mild to moderately dilated LV, mild to moderate LAE, EF  33% with global hypokinesis, normal RV size with mild systolic dysfunction.  There was no large VSD evident.  There was no myocardial delayed enhancement so no evidence for infiltrative disease or infarction.  LHC (7/10) showed only luminal irreg. in the coronaries and EF 35%  . Abnormal echocardiogram 2/11    Repeated after med mgmt of cardiomyopathy showed EF 40-45%, mildly dilated LV, mild LV hypertrophy, anterolateral and apical hypokinesis, mild diastolic dysfunction.  . VSD (ventricular septal defect)     Probable small perimembranous  . Diverticulosis   . Depression   . Esophageal erosions   . OSA (obstructive sleep apnea)     Not using CPAP  . GERD (gastroesophageal reflux disease)   . Cancer     melanoma back  . Arthritis   . Anemia     hx  . Pneumonia     HX OF pna  . Blood transfusion without reported diagnosis 09/2012     Past Surgical History  Procedure Laterality Date  . Rotator cuff repair  2/11    rt  . Inguinal hernia repair   1985    Left  . Laminectomy  1987    with discectomy  . Inguinal hernia repair  1999    Right  . Total hip arthroplasty  2011    Left  . Esophagogastroduodenoscopy  2012    1)Barrett's esoph, 2) Erosive esophagitis, 3) Mild gastritis in the antrum, 4) Duodenitis in the bulb of duodenum, 5) Small hiatal hernia  . Colonoscopy w/ biopsies  08/24/2006    Divertics, polyps x3, bx negative, repeat 2012 with mild diverticulosis in teh sigmoid colon and 1 polyp in tranverse colon, repeat due 2017  . Knee arthroscopy  10/15/2011    Procedure: ARTHROSCOPY KNEE;  Surgeon: Loreta Ave, MD;  Location: [facility];  Service: Orthopedics;  Laterality: Right;  right knee scope with lateral meniscectomy, removal loose foreign body, and microfracture technique  . Cardiac catheterization  05/30/2009    Dilated cardiomyopathy.  min VSD.  Hypokin Inf  wall.  Multiple min luminal Irr.  EF 35%  . Total knee arthroplasty  06/01/2012    Procedure: TOTAL KNEE ARTHROPLASTY;  Surgeon: Loreta Ave, MD;  Location: [facility] OR;  Service: Orthopedics;  Laterality: Right;     Family History  Problem Relation Age of Onset  . Heart disease Father     CABG  . Stroke Father   . Diabetes Father   . Hypertension Mother   . Alzheimer's disease Mother   . Diabetes Sister   . Hypertension Sister   . Diabetes Brother   . Throat cancer Brother     Throat  . Diabetes Maternal Grandmother     Insulin  . Drug abuse Neg Hx   . Prostate cancer Neg Hx   . Diabetes Sister   . Hypertension Sister   . Throat cancer Other     Throat  . Diabetes Other   . Alcohol abuse Other   . Colon cancer Cousin      History   Social History  . Marital Status: Single    Spouse Name: N/A    Number of Children: N/A  . 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 Smoking status: Former Smoker -- 20 years    Types: Cigarettes      Quit date: 11/09/1998  . Smokeless tobacco: Former Neurosurgeon    Quit date: 06/03/2007     Comment: no alcohol since nov 12  .  Alcohol Use: No     Comment: sober as 09/2011  . Drug Use: No  . Sexual Activity: Not Currently   Other Topics Concern  . Not on file   Social History Narrative   Lives in [place]   Divorced        PHYSICAL EXAM   BP 114/60  Pulse 84  Ht 5\' 10"  (1.778 m)  Wt 242 lb 8 oz (109.997 kg)  BMI 34.80 kg/m2 Constitutional: He is oriented to person, place, and time. He appears well-developed and well-nourished. No distress.  HENT: No nasal discharge.  Head: Normocephalic and atraumatic.  Eyes: Pupils are equal and round. Right eye exhibits no discharge. Left eye exhibits no discharge.  Neck: Normal range of motion. Neck supple. No JVD present. No thyromegaly present.  Cardiovascular: Normal rate, regular rhythm, normal heart sounds and. Exam reveals no gallop and no friction rub. No murmur heard.  Pulmonary/Chest: Effort normal and breath sounds normal. No stridor. No respiratory distress. He has no wheezes. He has no rales. He exhibits no tenderness.  Abdominal: Soft. Bowel sounds are normal. He exhibits no distension. There is no tenderness. There is no rebound and no guarding.  Musculoskeletal: Normal range of motion. He exhibits no edema and no tenderness.  Neurological: He is alert and oriented to person, place, and time. Coordination normal.  Skin: Skin is warm and dry. No rash noted. He is not diaphoretic. No erythema. No pallor.  Psychiatric: He has a normal mood and affect. His behavior is normal. Judgment and thought content normal.       EKG: Sinus  Rhythm  - occasional ectopic ventricular beat    -Right bundle branch block with left axis -bifascicular block.   ABNORMAL     ASSESSMENT AND PLAN

## 2013-10-09 NOTE — Assessment & Plan Note (Signed)
Blood pressure is well controlled on current medications. 

## 2013-10-09 NOTE — Assessment & Plan Note (Signed)
Lab Results  Component Value Date   CHOL 173 07/24/2013   HDL 39.30 07/24/2013   LDLCALC 101* 07/24/2013   LDLDIRECT 138.8 06/17/2011   TRIG 165.0* 07/24/2013   CHOLHDL 4 07/24/2013   Triglyceride is likely elevated due to uncontrolled diabetes. Continue treatment with pravastatin with a target LDL of less than 100.

## 2013-10-09 NOTE — Assessment & Plan Note (Signed)
He is currently New York Heart Association class II. Most recent ejection fraction was 50-55% in May of 2013. He appears to be euvolemic and doing very well on current medications. No changes were made today. Recent metabolic profile showed normal electrolytes.

## 2013-10-09 NOTE — Patient Instructions (Signed)
Continue same medications.   Your physician wants you to follow-up in: 6 months.  You will receive a reminder letter in the mail two months in advance. If you don't receive a letter, please call our office to schedule the follow-up appointment.  

## 2013-10-16 ENCOUNTER — Other Ambulatory Visit: Payer: Self-pay | Admitting: Family Medicine

## 2013-10-20 ENCOUNTER — Other Ambulatory Visit (INDEPENDENT_AMBULATORY_CARE_PROVIDER_SITE_OTHER): Payer: 59

## 2013-10-20 DIAGNOSIS — E119 Type 2 diabetes mellitus without complications: Secondary | ICD-10-CM

## 2013-10-20 LAB — HEMOGLOBIN A1C: Hgb A1c MFr Bld: 7.9 % — ABNORMAL HIGH (ref 4.6–6.5)

## 2013-10-27 ENCOUNTER — Encounter: Payer: Self-pay | Admitting: Family Medicine

## 2013-10-27 ENCOUNTER — Ambulatory Visit (INDEPENDENT_AMBULATORY_CARE_PROVIDER_SITE_OTHER): Payer: 59 | Admitting: Family Medicine

## 2013-10-27 VITALS — BP 102/72 | HR 64 | Temp 98.1°F | Wt 241.5 lb

## 2013-10-27 DIAGNOSIS — E119 Type 2 diabetes mellitus without complications: Secondary | ICD-10-CM

## 2013-10-27 DIAGNOSIS — R000 Tachycardia, unspecified: Secondary | ICD-10-CM

## 2013-10-27 DIAGNOSIS — I499 Cardiac arrhythmia, unspecified: Secondary | ICD-10-CM

## 2013-10-27 NOTE — Patient Instructions (Addendum)
Look at Heart.org and diabetes.org.  Look at the diet tips.  Recheck A1c in spring of 2015 before a visit.  We'll notify cards in the meantime. Take care.

## 2013-10-27 NOTE — Progress Notes (Signed)
Pre-visit discussion using our clinic review tool. No additional management support is needed unless otherwise documented below in the visit note. Patient's heart rate seemed a bit slow and irregular.  Tim Mccullough, CMA  Heart rate noted. Prev seen by cards and occ ectopy noted on prev EKG.  H/o ectopy noted by patient, longstanding. No CP, SOB, BLE edema, syncope, presyncope.   Diabetes: Using medications without difficulties: yes Hypoglycemic episodes:no Hyperglycemic episodes:no Feet problems:no Blood Sugars averaging:150-190 in AM eye exam within last year: due, encouraged.  Diet and exercise d/w pt.  Walking some for exercise.  Working on eating more fruit and veggies. A1c some better, 8.1 to 7.9.    Meds, vitals, and allergies reviewed.   ROS: See HPI.  Otherwise negative.    GEN: nad, alert and oriented HEENT: mucous membranes moist NECK: supple w/o LA CV: ectopy noted, rate in the 60s.  PULM: ctab, no inc wob ABD: soft, +bs EXT: no edema SKIN: no acute rash  Diabetic foot exam: Normal inspection No skin breakdown No calluses  Normal DP pulses Normal sensation to light touch and monofilament Nails normal

## 2013-10-28 NOTE — Assessment & Plan Note (Signed)
Labs discussed, no med changes.  He'll work on weight.  See instructions.

## 2013-10-28 NOTE — Assessment & Plan Note (Signed)
Now with PACs noted.  EKG okay, feeling well, no syncope, CP.  No change in meds.

## 2014-01-07 ENCOUNTER — Other Ambulatory Visit: Payer: Self-pay | Admitting: Family Medicine

## 2014-01-07 ENCOUNTER — Other Ambulatory Visit: Payer: Self-pay | Admitting: Gastroenterology

## 2014-01-08 NOTE — Telephone Encounter (Signed)
NEEDS OFFICE VISIT FOR FURTHER REFILLS  

## 2014-01-14 ENCOUNTER — Other Ambulatory Visit: Payer: Self-pay | Admitting: Family Medicine

## 2014-01-15 NOTE — Telephone Encounter (Signed)
Sent. Thanks.   

## 2014-01-15 NOTE — Telephone Encounter (Signed)
Received refill request electronically. Last office visit 10/27/13. Is it okay to refill medication?

## 2014-01-22 ENCOUNTER — Other Ambulatory Visit: Payer: Self-pay | Admitting: Family Medicine

## 2014-02-13 ENCOUNTER — Other Ambulatory Visit: Payer: Self-pay | Admitting: Family Medicine

## 2014-02-17 ENCOUNTER — Other Ambulatory Visit: Payer: Self-pay | Admitting: Cardiovascular Disease

## 2014-02-24 ENCOUNTER — Other Ambulatory Visit: Payer: Self-pay | Admitting: Cardiovascular Disease

## 2014-03-15 ENCOUNTER — Other Ambulatory Visit: Payer: Self-pay | Admitting: Family Medicine

## 2014-03-15 DIAGNOSIS — E119 Type 2 diabetes mellitus without complications: Secondary | ICD-10-CM

## 2014-03-23 ENCOUNTER — Other Ambulatory Visit (INDEPENDENT_AMBULATORY_CARE_PROVIDER_SITE_OTHER): Payer: Medicare Other

## 2014-03-23 DIAGNOSIS — E119 Type 2 diabetes mellitus without complications: Secondary | ICD-10-CM

## 2014-03-23 LAB — HEMOGLOBIN A1C: HEMOGLOBIN A1C: 7 % — AB (ref 4.6–6.5)

## 2014-03-30 ENCOUNTER — Ambulatory Visit (INDEPENDENT_AMBULATORY_CARE_PROVIDER_SITE_OTHER): Payer: Medicare Other | Admitting: Family Medicine

## 2014-03-30 ENCOUNTER — Encounter: Payer: Self-pay | Admitting: Family Medicine

## 2014-03-30 VITALS — BP 90/52 | HR 77 | Temp 98.0°F | Wt 216.5 lb

## 2014-03-30 DIAGNOSIS — I10 Essential (primary) hypertension: Secondary | ICD-10-CM | POA: Diagnosis not present

## 2014-03-30 DIAGNOSIS — E119 Type 2 diabetes mellitus without complications: Secondary | ICD-10-CM | POA: Diagnosis not present

## 2014-03-30 MED ORDER — LOSARTAN POTASSIUM 100 MG PO TABS: 50.0000 mg | ORAL_TABLET | Freq: Every day | ORAL | Status: DC

## 2014-03-30 NOTE — Patient Instructions (Addendum)
Stop the spironolactone.  Cut the losartan back to 50mg  a day.  Update me with your BP and pulse next week.   Schedule a physical for about 4 months from now.   If you have low sugars in the meantime, cut back to 1 metformin twice a day and notify me.  Take care.

## 2014-03-30 NOTE — Progress Notes (Signed)
Pre visit review using our clinic review tool, if applicable. No additional management support is needed unless otherwise documented below in the visit note.  Diabetes:  Using medications without difficulties:yes Hypoglycemic episodes:no Hyperglycemic episodes:no Feet problems:no Blood Sugars averaging: none higher than 150 eye exam within last year: done !2 months ago.   Dramatic intention weight loss with persistent diet and exercise.   Hypertension:    Using medication without problems or lightheadedness: he is lightheaded.  Chest pain with exertion:no Edema:no Short of breath:no Average home AOZ:HYQMVHQ to today.     Position neck pain, center, near C6.  Worse with prolonged computer use.  No arm sx.  No weakness.    Meds, vitals, and allergies reviewed.   ROS: See HPI.  Otherwise negative.    GEN: nad, alert and oriented HEENT: mucous membranes moist NECK: supple w/o LA CV: rrr. PULM: ctab, no inc wob ABD: soft, +bs EXT: no edema SKIN: no acute rash

## 2014-03-30 NOTE — Assessment & Plan Note (Signed)
Dec losartan to 50mg  a day. Stop spironolactone for now.  We may need to cut his BB, he'll update me next week with BP and pulse.  Appears euvolemic.

## 2014-03-30 NOTE — Assessment & Plan Note (Signed)
Now controlled, continue as is.  If any low sugars, then cut his metformin to 500mg  BID.  He agrees.  Intentional weight loss noted and appreciated.

## 2014-04-03 ENCOUNTER — Telehealth: Payer: Self-pay | Admitting: Family Medicine

## 2014-04-03 NOTE — Telephone Encounter (Signed)
Relevant patient education mailed to patient.  

## 2014-04-04 DIAGNOSIS — M5412 Radiculopathy, cervical region: Secondary | ICD-10-CM | POA: Diagnosis not present

## 2014-04-04 DIAGNOSIS — M5030 Other cervical disc degeneration, unspecified cervical region: Secondary | ICD-10-CM | POA: Diagnosis not present

## 2014-04-05 ENCOUNTER — Other Ambulatory Visit: Payer: Self-pay | Admitting: Family Medicine

## 2014-04-08 ENCOUNTER — Other Ambulatory Visit: Payer: Self-pay | Admitting: Gastroenterology

## 2014-04-09 ENCOUNTER — Telehealth: Payer: Self-pay

## 2014-04-09 ENCOUNTER — Ambulatory Visit (INDEPENDENT_AMBULATORY_CARE_PROVIDER_SITE_OTHER): Payer: Medicare Other | Admitting: Cardiovascular Disease

## 2014-04-09 ENCOUNTER — Encounter: Payer: Self-pay | Admitting: Cardiovascular Disease

## 2014-04-09 VITALS — BP 105/69 | HR 75 | Ht 70.0 in | Wt 216.2 lb

## 2014-04-09 DIAGNOSIS — E785 Hyperlipidemia, unspecified: Secondary | ICD-10-CM | POA: Diagnosis not present

## 2014-04-09 DIAGNOSIS — I5022 Chronic systolic (congestive) heart failure: Secondary | ICD-10-CM | POA: Diagnosis not present

## 2014-04-09 DIAGNOSIS — I509 Heart failure, unspecified: Secondary | ICD-10-CM

## 2014-04-09 DIAGNOSIS — I10 Essential (primary) hypertension: Secondary | ICD-10-CM

## 2014-04-09 NOTE — Telephone Encounter (Signed)
Left message on voice mail  to call back

## 2014-04-09 NOTE — Assessment & Plan Note (Signed)
He used to be on multiple antihypertensive medications but currently only on losartan and carvedilol with optimal blood pressure control.

## 2014-04-09 NOTE — Telephone Encounter (Signed)
Those are good.  If he is feeling better, then I would continue as is. Thanks.

## 2014-04-09 NOTE — Assessment & Plan Note (Signed)
He is doing very well with no evidence of fluid overload. Most recent ejection fraction was 50-55%. Continue medical therapy.

## 2014-04-09 NOTE — Telephone Encounter (Signed)
Pt left v/m; pt was seen on 03/30/14; pts BP is ranging between 110 - 120 / 61 - 85. P 61-85.Please advise.

## 2014-04-09 NOTE — Assessment & Plan Note (Signed)
Continue treatment with pravastatin with a target LDL of less than 100. I suspect that his hyperlipidemia is now improved after he lost weight.

## 2014-04-09 NOTE — Patient Instructions (Signed)
Continue same medications.   Your physician wants you to follow-up in: 1 year.  You will receive a reminder letter in the mail two months in advance. If you don't receive a letter, please call our office to schedule the follow-up appointment.  

## 2014-04-09 NOTE — Progress Notes (Signed)
HPI  This is a 63 year old man who is here today for a followup visit.  He has known history of nonischemic cardiomyopathy which was diagnosed in 2010. He had cardiac catheterization done which showed minor luminal irregularities without obstructive CAD. Ejection fraction was 35%. Most recent ejection fraction was 50-55% in May of  2013 .  He had gradual intentional weight loss which has improved his diabetes and blood pressure control. He was taken off multiple antihypertensive medications most recently spironolactone. Currently he is on carvedilol and losartan which was decreased to 50 mg once daily. He has been doing very well and denies any chest pain or shortness of breath.   No Known Allergies   Current Outpatient Prescriptions on File Prior to Visit  Medication Sig Dispense Refill  . aspirin 81 MG tablet Take 81 mg by mouth 2 (two) times daily.      . carvedilol (COREG) 25 MG tablet TAKE 1 TABLET BY MOUTH TWICE A DAY WITH A MEAL  180 tablet  1  . doxazosin (CARDURA) 1 MG tablet TAKE 1 TABLET (1 MG TOTAL) BY MOUTH AT BEDTIME.  30 tablet  5  . fish oil-omega-3 fatty acids 1000 MG capsule Take 1 g by mouth 2 (two) times daily.       . Lancets (ONETOUCH ULTRASOFT) lancets USE DAILY AS NEEDED TO CHECK SUGAR, DX 250.00  100 each  2  . lansoprazole (PREVACID) 30 MG capsule TAKE 1 CAPSULE (30 MG TOTAL) BY MOUTH DAILY.  90 capsule  0  . losartan (COZAAR) 100 MG tablet Take 0.5 tablets (50 mg total) by mouth daily.      . metFORMIN (GLUCOPHAGE) 500 MG tablet Take 2 tabs twice a day      . ONE TOUCH ULTRA TEST test strip CHECK BLOOD SUGAR DAILY AND IF BLOOD SUGAR FLUCTUATES. 250.00  100 each  2  . PARoxetine (PAXIL) 20 MG tablet TAKE 1 TABLET (20 MG TOTAL) BY MOUTH DAILY.  90 tablet  3  . pravastatin (PRAVACHOL) 40 MG tablet TAKE 1 TABLET (40 MG TOTAL) BY MOUTH AT BEDTIME.  90 tablet  3   No current facility-administered medications on file prior to visit.     Past Medical History    Diagnosis Date  . Diabetes mellitus 11/2006    Type II  . Hyperlipidemia 04/1998  . Hypertension 1995  . BPH (benign prostatic hypertrophy) 2004  . Sinus tachycardia     Pt says he has been told his heart rate is high since he was in high school.   . Holter monitor, abnormal 7/10    Frequent PAC's and PVC's  Average HR 96  (but he had started Toprol XL).  HR range 71-130  . Obesity   . Hip pain   . Cardiomyopathy     ? tachycardia-mediated  . Echocardiogram abnormal 7/10    Mild LVH with a mildly dilated LV.  EF was difficult to assess given poor acoustic windows but appeared moderately decreased.  Mild MR.  Probable small perimembranous VSD, mild LAE  . History of MRI 7/10    Cardiac MRI was done to followup difficult echo.  This showed mild to moderately dilated LV, mild to moderate LAE, EF  33% with global hypokinesis, normal RV size with mild systolic dysfunction.  There was no large VSD evident.  There was no myocardial delayed enhancement so no evidence for infiltrative disease or infarction.  LHC (7/10) showed only luminal irreg. in the coronaries and  EF 35%  . Abnormal echocardiogram 2/11    Repeated after med mgmt of cardiomyopathy showed EF 40-45%, mildly dilated LV, mild LV hypertrophy, anterolateral and apical hypokinesis, mild diastolic dysfunction.  . VSD (ventricular septal defect)     Probable small perimembranous  . Diverticulosis   . Depression   . Esophageal erosions   . OSA (obstructive sleep apnea)     Not using CPAP  . GERD (gastroesophageal reflux disease)   . Cancer     melanoma back  . Arthritis   . Anemia     hx  . Pneumonia     HX OF pna  . Blood transfusion without reported diagnosis 09/2012     Past Surgical History  Procedure Laterality Date  . Rotator cuff repair  2/11    rt  . Inguinal hernia repair  1985    Left  . Laminectomy  1987    with discectomy  . Inguinal hernia repair  1999    Right  . Total hip arthroplasty  2011    Left   . Esophagogastroduodenoscopy  2012    1)Barrett's esoph, 2) Erosive esophagitis, 3) Mild gastritis in the antrum, 4) Duodenitis in the bulb of duodenum, 5) Small hiatal hernia  . Colonoscopy w/ biopsies  08/24/2006    Divertics, polyps x3, bx negative, repeat 2012 with mild diverticulosis in teh sigmoid colon and 1 polyp in tranverse colon, repeat due 2017  . Knee arthroscopy  10/15/2011    Procedure: ARTHROSCOPY KNEE;  Surgeon: Ninetta Lights, MD;  Location: [facility];  Service: Orthopedics;  Laterality: Right;  right knee scope with lateral meniscectomy, removal loose foreign body, and microfracture technique  . Cardiac catheterization  05/30/2009    Dilated cardiomyopathy.  min VSD.  Hypokin Inf  wall.  Multiple min luminal Irr.  EF 35%  . Total knee arthroplasty  06/01/2012    Procedure: TOTAL KNEE ARTHROPLASTY;  Surgeon: Ninetta Lights, MD;  Location: [facility];  Service: Orthopedics;  Laterality: Right;     Family History  Problem Relation Age of Onset  . Heart disease Father     CABG  . Stroke Father   . Diabetes Father   . Hypertension Mother   . Alzheimer's disease Mother   . Diabetes Sister   . Hypertension Sister   . Diabetes Brother   . Throat cancer Brother     Throat  . Diabetes Maternal Grandmother     Insulin  . Drug abuse Neg Hx   . Prostate cancer Neg Hx   . Diabetes Sister   . Hypertension Sister   . Throat cancer Other     Throat  . Diabetes Other   . Alcohol abuse Other   . Colon cancer Cousin      History   Social History  . Marital Status: Single    Spouse Name: N/A    Number of Children: N/A  . Years of Education: N/A   Occupational History  . ADJUSTER   . [place] History Main Topics  . Smoking status: Former Smoker -- 20 years    Types: Cigarettes    Quit date: 11/09/1998  . Smokeless tobacco: Former Systems developer    Quit date: 06/03/2007     Comment: no alcohol since nov 12  . Alcohol Use:  No     Comment: sober as 09/2011  . Drug Use: No  . Sexual Activity: Not Currently  Other Topics Concern  . Not on file   Social History Narrative   Lives in [place]   Divorced        PHYSICAL EXAM   BP 105/69  Pulse 75  Ht 5\' 10"  (1.778 m)  Wt 216 lb 4 oz (98.09 kg)  BMI 31.03 kg/m2 Constitutional: He is oriented to person, place, and time. He appears well-developed and well-nourished. No distress.  HENT: No nasal discharge.  Head: Normocephalic and atraumatic.  Eyes: Pupils are equal and round. Right eye exhibits no discharge. Left eye exhibits no discharge.  Neck: Normal range of motion. Neck supple. No JVD present. No thyromegaly present.  Cardiovascular: Normal rate, regular rhythm, normal heart sounds and. Exam reveals no gallop and no friction rub. No murmur heard.  Pulmonary/Chest: Effort normal and breath sounds normal. No stridor. No respiratory distress. He has no wheezes. He has no rales. He exhibits no tenderness.  Abdominal: Soft. Bowel sounds are normal. He exhibits no distension. There is no tenderness. There is no rebound and no guarding.  Musculoskeletal: Normal range of motion. He exhibits no edema and no tenderness.  Neurological: He is alert and oriented to person, place, and time. Coordination normal.  Skin: Skin is warm and dry. No rash noted. He is not diaphoretic. No erythema. No pallor.  Psychiatric: He has a normal mood and affect. His behavior is normal. Judgment and thought content normal.       EKG: Sinus  Rhythm  WITHIN NORMAL LIMITS    ASSESSMENT AND PLAN

## 2014-04-10 NOTE — Telephone Encounter (Signed)
Left message on voice mail  to call back

## 2014-04-12 DIAGNOSIS — M542 Cervicalgia: Secondary | ICD-10-CM | POA: Diagnosis not present

## 2014-04-12 NOTE — Telephone Encounter (Signed)
Left detailed message on voicemail.  

## 2014-04-16 DIAGNOSIS — M5030 Other cervical disc degeneration, unspecified cervical region: Secondary | ICD-10-CM | POA: Diagnosis not present

## 2014-04-16 DIAGNOSIS — M5412 Radiculopathy, cervical region: Secondary | ICD-10-CM | POA: Diagnosis not present

## 2014-05-02 ENCOUNTER — Other Ambulatory Visit: Payer: Self-pay | Admitting: Family Medicine

## 2014-06-25 ENCOUNTER — Telehealth: Payer: Self-pay

## 2014-06-25 MED ORDER — METFORMIN HCL 500 MG PO TABS: 500.0000 mg | ORAL_TABLET | Freq: Two times a day (BID) | ORAL | Status: DC

## 2014-06-25 NOTE — Telephone Encounter (Signed)
Continue as 1 bid. Sent. Thanks.

## 2014-06-25 NOTE — Telephone Encounter (Signed)
Patient advised.

## 2014-06-25 NOTE — Telephone Encounter (Signed)
Pt said could not get metformin filled at [facility] because too early;med list has 1 tab bid; pt said Dr Damita Dunnings advised him to take 2 tab po bid unless BS was low and then decrease to 1 tab bid. Pt said had been taking 2 tabs bid until 2 weeks ago when FBS went to 60. Pt started on 1 tab twice and day and has continued that dosage; last 2 weeks FBS range 80-105 and after eating around 170. Pt has lost 40 lbs since last in office and he feels so much better. How should pt be taking metformin now and will need rx sent to [facility]. Pt request cb.

## 2014-06-30 ENCOUNTER — Other Ambulatory Visit: Payer: Self-pay | Admitting: Family Medicine

## 2014-07-03 ENCOUNTER — Other Ambulatory Visit: Payer: Self-pay | Admitting: Family Medicine

## 2014-07-07 ENCOUNTER — Other Ambulatory Visit: Payer: Self-pay | Admitting: Family Medicine

## 2014-07-07 ENCOUNTER — Other Ambulatory Visit: Payer: Self-pay | Admitting: Cardiovascular Disease

## 2014-07-14 ENCOUNTER — Other Ambulatory Visit: Payer: Self-pay | Admitting: Family Medicine

## 2014-07-22 ENCOUNTER — Other Ambulatory Visit: Payer: Self-pay | Admitting: Family Medicine

## 2014-07-22 DIAGNOSIS — E119 Type 2 diabetes mellitus without complications: Secondary | ICD-10-CM

## 2014-07-23 ENCOUNTER — Other Ambulatory Visit: Payer: 59

## 2014-07-30 ENCOUNTER — Encounter: Payer: 59 | Admitting: Family Medicine

## 2014-07-30 DIAGNOSIS — Z0289 Encounter for other administrative examinations: Secondary | ICD-10-CM

## 2014-07-31 ENCOUNTER — Telehealth: Payer: Self-pay | Admitting: Family Medicine

## 2014-07-31 NOTE — Telephone Encounter (Signed)
Needs to be rescheduled

## 2014-07-31 NOTE — Telephone Encounter (Signed)
Patient did not come for their scheduled appointment 07/30/14 for cpx  Please let me know if the patient needs to be contacted immediately for follow up or if no follow up is necessary.

## 2014-08-01 NOTE — Telephone Encounter (Signed)
Labs 10/5 cpx 10/9 pt aware

## 2014-08-13 ENCOUNTER — Other Ambulatory Visit (INDEPENDENT_AMBULATORY_CARE_PROVIDER_SITE_OTHER): Payer: Medicare Other

## 2014-08-13 DIAGNOSIS — E119 Type 2 diabetes mellitus without complications: Secondary | ICD-10-CM | POA: Diagnosis not present

## 2014-08-13 LAB — COMPREHENSIVE METABOLIC PANEL
ALT: 17 U/L (ref 0–53)
AST: 21 U/L (ref 0–37)
Albumin: 3.9 g/dL (ref 3.5–5.2)
Alkaline Phosphatase: 57 U/L (ref 39–117)
BUN: 21 mg/dL (ref 6–23)
CO2: 28 mEq/L (ref 19–32)
Calcium: 9 mg/dL (ref 8.4–10.5)
Chloride: 104 mEq/L (ref 96–112)
Creatinine, Ser: 1 mg/dL (ref 0.4–1.5)
GFR: 78.5 mL/min (ref >60.00)
Glucose, Bld: 128 mg/dL — ABNORMAL HIGH (ref 70–99)
Potassium: 4.5 mEq/L (ref 3.5–5.1)
Sodium: 140 mEq/L (ref 135–145)
Total Bilirubin: 0.7 mg/dL (ref 0.2–1.2)
Total Protein: 6.9 g/dL (ref 6.0–8.3)

## 2014-08-13 LAB — LIPID PANEL
CHOL/HDL RATIO: 4
Cholesterol: 187 mg/dL (ref 0–200)
HDL: 49.9 mg/dL (ref >39.00)
LDL Cholesterol: 104 mg/dL — ABNORMAL HIGH (ref 0–99)
NONHDL: 137.1
Triglycerides: 166 mg/dL — ABNORMAL HIGH (ref 0.0–149.0)
VLDL: 33.2 mg/dL (ref 0.0–40.0)

## 2014-08-13 LAB — HEMOGLOBIN A1C: Hgb A1c MFr Bld: 6.6 % — ABNORMAL HIGH (ref 4.6–6.5)

## 2014-08-17 ENCOUNTER — Ambulatory Visit (INDEPENDENT_AMBULATORY_CARE_PROVIDER_SITE_OTHER): Payer: Medicare Other | Admitting: Family Medicine

## 2014-08-17 ENCOUNTER — Encounter: Payer: Self-pay | Admitting: Family Medicine

## 2014-08-17 VITALS — BP 106/66 | HR 82 | Temp 98.3°F | Ht 68.75 in | Wt 209.5 lb

## 2014-08-17 DIAGNOSIS — Z23 Encounter for immunization: Secondary | ICD-10-CM | POA: Diagnosis not present

## 2014-08-17 DIAGNOSIS — E1169 Type 2 diabetes mellitus with other specified complication: Secondary | ICD-10-CM

## 2014-08-17 DIAGNOSIS — I10 Essential (primary) hypertension: Secondary | ICD-10-CM

## 2014-08-17 DIAGNOSIS — E785 Hyperlipidemia, unspecified: Secondary | ICD-10-CM

## 2014-08-17 DIAGNOSIS — Z0000 Encounter for general adult medical examination without abnormal findings: Secondary | ICD-10-CM

## 2014-08-17 DIAGNOSIS — Z7189 Other specified counseling: Secondary | ICD-10-CM

## 2014-08-17 DIAGNOSIS — E119 Type 2 diabetes mellitus without complications: Secondary | ICD-10-CM

## 2014-08-17 MED ORDER — METFORMIN HCL 500 MG PO TABS: 500.0000 mg | ORAL_TABLET | Freq: Every day | ORAL | Status: DC

## 2014-08-17 MED ORDER — PAROXETINE HCL 20 MG PO TABS: ORAL_TABLET | ORAL | Status: DC

## 2014-08-17 MED ORDER — PRAVASTATIN SODIUM 40 MG PO TABS: ORAL_TABLET | ORAL | Status: DC

## 2014-08-17 MED ORDER — CARVEDILOL 25 MG PO TABS: ORAL_TABLET | ORAL | Status: DC

## 2014-08-17 NOTE — Patient Instructions (Addendum)
Check with your insurance to see if they will cover the shingles shot. Cut back on the metfomin, to one pill a day.  Recheck A1c before a visit in about 6 months.

## 2014-08-17 NOTE — Progress Notes (Signed)
Pre visit review using our clinic review tool, if applicable. No additional management support is needed unless otherwise documented below in the visit note.  I have personally reviewed the Medicare Annual Wellness questionnaire and have noted 1. The patient's medical and social history 2. Their use of alcohol, tobacco or illicit drugs 3. Their current medications and supplements 4. The patient's functional ability including ADL's, fall risks, home safety risks and hearing or visual             impairment. 5. Diet and physical activities 6. Evidence for depression or mood disorders  The patients weight, height, BMI have been recorded in the chart and visual acuity is per eye clinic.  I have made referrals, counseling and provided education to the patient based review of the above and I have provided the pt with a written personalized care plan for preventive services.  Provider list updated- see scanned forms.  Routine anticipatory guidance given to patient.  See health maintenance.  Flu 2015 Shingles d/w pt.  PNA 2013 Tetanus 2014 Colon 2012 Prostate cancer screening and PSA options (with potential risks and benefits of testing vs not testing) were discussed along with recent recs/guidelines.  He declined testing PSA at this point. Advance directive- son Merrily Pew designated if patient were incapacitated.   Cognitive function addressed- see scanned forms- and if abnormal then additional documentation follows.   Diabetes:  Using medications without difficulties: yes Hypoglycemic episodes: rare Hyperglycemic episodes: no Feet problems: no Blood Sugars averaging: max was 155, usually much lower, ~100 eye exam within last year: ~01/2014 A1c improved.   D/w pt.   Hypertension:    Using medication without problems or lightheadedness: yes Chest pain with exertion: no Edema:no Short of breath:no  Elevated Cholesterol: Using medications without problems: yes Muscle aches: not from statin,  only from OA Diet compliance:yes Exercise:yes Labs d/w pt.    PMH and SH reviewed.   Vital signs, Meds and allergies reviewed.  ROS: See HPI.  Otherwise nontributory.   GEN: nad, alert and oriented HEENT: mucous membranes moist NECK: supple w/o LA CV: rrr. PULM: ctab, no inc wob ABD: soft, +bs EXT: no edema SKIN: no acute rash  Diabetic foot exam: Normal inspection No skin breakdown No calluses  Normal DP pulses Normal sensation to light tough and monofilament Nails normal

## 2014-08-19 ENCOUNTER — Telehealth: Payer: Self-pay | Admitting: Family Medicine

## 2014-08-19 DIAGNOSIS — Z7189 Other specified counseling: Secondary | ICD-10-CM | POA: Insufficient documentation

## 2014-08-19 NOTE — Assessment & Plan Note (Signed)
Flu 2015 Shingles d/w pt.  PNA 2013 Tetanus 2014 Colon 2012 Prostate cancer screening and PSA options (with potential risks and benefits of testing vs not testing) were discussed along with recent recs/guidelines.  He declined testing PSA at this point. Advance directive- son Merrily Pew designated if patient were incapacitated.   Cognitive function addressed- see scanned forms- and if abnormal then additional documentation follows.

## 2014-08-19 NOTE — Assessment & Plan Note (Signed)
Controlled, d/w pt.  Cut metformin back to once a day. Recheck in 6 months.  Continue work on diet and exercise.  Doing well.

## 2014-08-19 NOTE — Telephone Encounter (Signed)
Please call pt. Was asking about timing of f/u EGD and colonoscopy.  Looks to be due for both in 2017, assuming he has no changes in the meantime. Thanks.

## 2014-08-19 NOTE — Assessment & Plan Note (Signed)
Advance directive- son Merrily Pew designated if patient were incapacitated.

## 2014-08-19 NOTE — Assessment & Plan Note (Signed)
Reasonably controlled, continue as is.  D/w pt.  He agrees. No ADE on meds.

## 2014-08-19 NOTE — Assessment & Plan Note (Signed)
Controlled, continue as is.  D/w pt.  

## 2014-08-20 NOTE — Telephone Encounter (Signed)
Left message on voice mail  to call back

## 2014-08-21 NOTE — Telephone Encounter (Signed)
Patient notified as instructed by telephone. 

## 2014-09-08 ENCOUNTER — Other Ambulatory Visit: Payer: Self-pay | Admitting: Family Medicine

## 2014-09-14 DIAGNOSIS — D2261 Melanocytic nevi of right upper limb, including shoulder: Secondary | ICD-10-CM | POA: Diagnosis not present

## 2014-09-14 DIAGNOSIS — D2272 Melanocytic nevi of left lower limb, including hip: Secondary | ICD-10-CM | POA: Diagnosis not present

## 2014-09-14 DIAGNOSIS — L570 Actinic keratosis: Secondary | ICD-10-CM | POA: Diagnosis not present

## 2014-09-14 DIAGNOSIS — L821 Other seborrheic keratosis: Secondary | ICD-10-CM | POA: Diagnosis not present

## 2014-09-14 DIAGNOSIS — Z85820 Personal history of malignant melanoma of skin: Secondary | ICD-10-CM | POA: Diagnosis not present

## 2014-09-14 DIAGNOSIS — X32XXXA Exposure to sunlight, initial encounter: Secondary | ICD-10-CM | POA: Diagnosis not present

## 2014-10-19 ENCOUNTER — Other Ambulatory Visit: Payer: Self-pay | Admitting: Family Medicine

## 2014-11-05 ENCOUNTER — Other Ambulatory Visit: Payer: Self-pay | Admitting: *Deleted

## 2014-11-05 MED ORDER — LOSARTAN POTASSIUM 100 MG PO TABS: ORAL_TABLET | ORAL | Status: DC

## 2015-01-02 ENCOUNTER — Other Ambulatory Visit: Payer: Self-pay | Admitting: Family Medicine

## 2015-02-15 ENCOUNTER — Other Ambulatory Visit (INDEPENDENT_AMBULATORY_CARE_PROVIDER_SITE_OTHER): Payer: Medicare Other

## 2015-02-15 DIAGNOSIS — E119 Type 2 diabetes mellitus without complications: Secondary | ICD-10-CM

## 2015-02-15 LAB — HEMOGLOBIN A1C: HEMOGLOBIN A1C: 7 % — AB (ref 4.6–6.5)

## 2015-02-18 ENCOUNTER — Encounter: Payer: Self-pay | Admitting: Family Medicine

## 2015-02-18 ENCOUNTER — Ambulatory Visit (INDEPENDENT_AMBULATORY_CARE_PROVIDER_SITE_OTHER): Payer: Medicare Other | Admitting: Family Medicine

## 2015-02-18 VITALS — BP 102/60 | HR 94 | Temp 98.1°F | Wt 226.5 lb

## 2015-02-18 DIAGNOSIS — M1990 Unspecified osteoarthritis, unspecified site: Secondary | ICD-10-CM | POA: Insufficient documentation

## 2015-02-18 DIAGNOSIS — E119 Type 2 diabetes mellitus without complications: Secondary | ICD-10-CM

## 2015-02-18 NOTE — Assessment & Plan Note (Addendum)
A1c controlled at 7.  D/w pt.  Doing well overall, his weight is up and he'll work on that.  Last 3 A1cs are better than all form the year before those were done.  Recheck in about 6 months.

## 2015-02-18 NOTE — Patient Instructions (Signed)
Try taking 1 aleve in the AM and the PM with food for hand pain.  See if that helps.  Recheck in about 6 months at a physical.  Glad to see you.  A1c controlled at 7.  Take care.  Check with your insurance to see if they will cover the shingles shot.

## 2015-02-18 NOTE — Assessment & Plan Note (Signed)
Likely with mult MCP involvement in his dominant hand.  D/w pt.  Ibuprofen didn't help but aleve may be useful.  Use with food. Cr should allow.  Update me as needed.

## 2015-02-18 NOTE — Progress Notes (Signed)
Pre visit review using our clinic review tool, if applicable. No additional management support is needed unless otherwise documented below in the visit note.  Diabetes:  Using medications without difficulties:yes Hypoglycemic episodes: rarely, if prolonged fasting.   Hyperglycemic episodes: no Feet problems:no Blood Sugars averaging: ~80-150 in AM eye exam within last year: due soon, he'll call about f/u.    L handed.  Continues to have L handed MCP pain.  Pain with grip.  No loss of sensation.  H/o mult joint replacements from OA.   He has taken ibuprofen w/o a lot of relief.  The MCPs on the L hand occ get swollen but not red.  Noted more pain with weather changes.    Meds, vitals, and allergies reviewed.   ROS: See HPI.  Otherwise negative.    GEN: nad, alert and oriented HEENT: mucous membranes moist NECK: supple w/o LA CV: rrr. PULM: ctab, no inc wob ABD: soft, +bs EXT: no edema SKIN: no acute rash No acute erythema or puffiness on the MCPs and IPs on the L hand but some pain at the MCPs with grip.    Diabetic foot exam: Normal inspection No skin breakdown No calluses  Normal DP pulses Normal sensation to light touch and monofilament Nails normal

## 2015-03-20 ENCOUNTER — Encounter: Payer: Self-pay | Admitting: Primary Care

## 2015-03-20 ENCOUNTER — Ambulatory Visit (INDEPENDENT_AMBULATORY_CARE_PROVIDER_SITE_OTHER): Payer: Medicare Other | Admitting: Primary Care

## 2015-03-20 ENCOUNTER — Ambulatory Visit (INDEPENDENT_AMBULATORY_CARE_PROVIDER_SITE_OTHER)
Admission: RE | Admit: 2015-03-20 | Discharge: 2015-03-20 | Disposition: A | Discharge status: Home / Self Care | Payer: Medicare Other | Source: Ambulatory Visit | Attending: Primary Care | Admitting: Primary Care

## 2015-03-20 VITALS — BP 110/60 | HR 64 | Temp 98.1°F | Ht 68.75 in | Wt 227.1 lb

## 2015-03-20 DIAGNOSIS — M795 Residual foreign body in soft tissue: Secondary | ICD-10-CM

## 2015-03-20 DIAGNOSIS — M19042 Primary osteoarthritis, left hand: Secondary | ICD-10-CM | POA: Diagnosis not present

## 2015-03-20 MED ORDER — DOXYCYCLINE HYCLATE 100 MG PO TABS: 100.0000 mg | ORAL_TABLET | Freq: Two times a day (BID) | ORAL | Status: DC

## 2015-03-20 MED ORDER — HYDROCODONE-ACETAMINOPHEN 5-325 MG PO TABS: ORAL_TABLET | ORAL | Status: DC

## 2015-03-20 NOTE — Progress Notes (Signed)
Pre visit review using our clinic review tool, if applicable. No additional management support is needed unless otherwise documented below in the visit note. 

## 2015-03-20 NOTE — Progress Notes (Signed)
Subjective:    Patient ID: Tim Mccullough, male    DOB: [birth year], 64 y.o.   MRN: [id number]  HPI  Tim Mccullough is a 64 year old male who presents today with a chief complaint of foreign body (fish hook) present to his left 3rd digit near his PIP joint that occurred after fishing today around 12 pm. He tried to remove the hook by cutting the end and pulling out, but the hook disappeared superficially once he straightened his finger. He reports pain and inflammation. Last Tdap 03/2013.  Review of Systems  Constitutional: Negative for fever and chills.  Skin: Positive for color change and wound.  Neurological: Negative for numbness.       Past Medical History  Diagnosis Date  . Diabetes mellitus 11/2006    Type II  . Hyperlipidemia 04/1998  . Hypertension 1995  . BPH (benign prostatic hypertrophy) 2004  . Sinus tachycardia     Pt says he has been told his heart rate is high since he was in high school.   . Holter monitor, abnormal 7/10    Frequent PAC's and PVC's  Average HR 96  (but he had started Toprol XL).  HR range 71-130  . Obesity   . Hip pain   . Cardiomyopathy     ? tachycardia-mediated  . Echocardiogram abnormal 7/10    Mild LVH with a mildly dilated LV.  EF was difficult to assess given poor acoustic windows but appeared moderately decreased.  Mild MR.  Probable small perimembranous VSD, mild LAE  . History of MRI 7/10    Cardiac MRI was done to followup difficult echo.  This showed mild to moderately dilated LV, mild to moderate LAE, EF  33% with global hypokinesis, normal RV size with mild systolic dysfunction.  There was no large VSD evident.  There was no myocardial delayed enhancement so no evidence for infiltrative disease or infarction.  LHC (7/10) showed only luminal irreg. in the coronaries and EF 35%  . Abnormal echocardiogram 2/11    Repeated after med mgmt of cardiomyopathy showed EF 40-45%, mildly dilated LV, mild LV hypertrophy, anterolateral and apical  hypokinesis, mild diastolic dysfunction.  . VSD (ventricular septal defect)     Probable small perimembranous  . Diverticulosis   . Depression   . Esophageal erosions   . OSA (obstructive sleep apnea)     Not using CPAP  . GERD (gastroesophageal reflux disease)   . Cancer     melanoma back  . Arthritis   . Anemia     hx  . Pneumonia     HX OF pna  . Blood transfusion without reported diagnosis 09/2012    History   Social History  . Marital Status: Single    Spouse Name: N/A  . Number of Children: N/A  . Years of Education: N/A   Occupational History  . ADJUSTER   . [place] History Main Topics  . Smoking status: Former Smoker -- 20 years    Types: Cigarettes    Quit date: 11/09/1998  . Smokeless tobacco: Former Systems developer    Quit date: 06/03/2007     Comment: no alcohol since nov 12  . Alcohol Use: No     Comment: sober as 09/2011  . Drug Use: No  . Sexual Activity: Not Currently   Other Topics Concern  . Not on file   Social History Narrative   Lives in [place], his daughter  and her kids moved in with him (2015)   Divorced    Past Surgical History  Procedure Laterality Date  . Rotator cuff repair  2/11    rt  . Inguinal hernia repair  1985    Left  . Laminectomy  1987    with discectomy  . Inguinal hernia repair  1999    Right  . Total hip arthroplasty  2011    Left  . Esophagogastroduodenoscopy  2012    1)Barrett's esoph, 2) Erosive esophagitis, 3) Mild gastritis in the antrum, 4) Duodenitis in the bulb of duodenum, 5) Small hiatal hernia  . Colonoscopy w/ biopsies  08/24/2006    Divertics, polyps x3, bx negative, repeat 2012 with mild diverticulosis in teh sigmoid colon and 1 polyp in tranverse colon, repeat due 2017  . Knee arthroscopy  10/15/2011    Procedure: ARTHROSCOPY KNEE;  Surgeon: Ninetta Lights, MD;  Location: [facility];  Service: Orthopedics;  Laterality: Right;  right knee scope with  lateral meniscectomy, removal loose foreign body, and microfracture technique  . Cardiac catheterization  05/30/2009    Dilated cardiomyopathy.  min VSD.  Hypokin Inf  wall.  Multiple min luminal Irr.  EF 35%  . Total knee arthroplasty  06/01/2012    Procedure: TOTAL KNEE ARTHROPLASTY;  Surgeon: Ninetta Lights, MD;  Location: [facility];  Service: Orthopedics;  Laterality: Right;    Family History  Problem Relation Age of Onset  . Heart disease Father     CABG  . Stroke Father   . Diabetes Father   . Hypertension Mother   . Alzheimer's disease Mother   . Diabetes Sister   . Hypertension Sister   . Cancer Sister     uterine cancer  . Diabetes Brother   . Throat cancer Brother     Throat  . Diabetes Maternal Grandmother     Insulin  . Drug abuse Neg Hx   . Prostate cancer Neg Hx   . Diabetes Sister   . Hypertension Sister   . Throat cancer Other     Throat  . Diabetes Other   . Alcohol abuse Other   . Colon cancer Cousin     No Known Allergies  Current Outpatient Prescriptions on File Prior to Visit  Medication Sig Dispense Refill  . aspirin 81 MG tablet Take 81 mg by mouth 2 (two) times daily.    . carvedilol (COREG) 25 MG tablet TAKE 1 TABLET BY MOUTH TWICE A DAY WITH A MEAL 180 tablet 3  . doxazosin (CARDURA) 1 MG tablet TAKE 1 TABLET (1 MG TOTAL) BY MOUTH AT BEDTIME. 90 tablet 3  . fish oil-omega-3 fatty acids 1000 MG capsule Take 1 g by mouth 2 (two) times daily.     . Lancets (ONETOUCH ULTRASOFT) lancets USE DAILY AS NEEDED TO CHECK SUGAR, DX 250.00 100 each 2  . lansoprazole (PREVACID) 30 MG capsule TAKE 1 CAPSULE (30 MG TOTAL) BY MOUTH DAILY. 90 capsule 0  . losartan (COZAAR) 100 MG tablet 1/2 TABLET EVERY MORNING 30 tablet 3  . metFORMIN (GLUCOPHAGE) 500 MG tablet Take 1 tablet (500 mg total) by mouth daily with breakfast. 90 tablet 3  . ONE TOUCH ULTRA TEST test strip CHECK BLOOD SUGAR DAILY AND IF BLOOD SUGAR FLUCTUATES. 250.00 100 each 11  . PARoxetine (PAXIL) 20  MG tablet TAKE 1 TABLET (20 MG TOTAL) BY MOUTH DAILY. 90 tablet 3  . pravastatin (PRAVACHOL) 40 MG tablet TAKE 1 TABLET  BY MOUTH AT BEDTIME 90 tablet 3   No current facility-administered medications on file prior to visit.    BP 110/60 mmHg  Pulse 64  Temp(Src) 98.1 F (36.7 C) (Oral)  Ht 5' 8.75" (1.746 m)  Wt 227 lb 1.9 oz (103.021 kg)  BMI 33.79 kg/m2  SpO2 94%    Objective:   Physical Exam  Constitutional: He is oriented to person, place, and time.  Cardiovascular: Normal rate.  An irregularly irregular rhythm present.  Pulmonary/Chest: Effort normal and breath sounds normal.  Musculoskeletal:  Decreased ROM to left 3rd digit to PIP joint due to foreign body.  Neurological: He is alert and oriented to person, place, and time.  Skin: Skin is warm and dry.  Small superficial lac to left 3rd PIP joint at point of entry. No visualization of hook present, and difficult to feel superficially. Patient can feel as he flexes his fingers. Mild erythema, tender upon palpation.           Assessment & Plan:  Foreign Body to left 3rd digit:  Xray shows hook is present to left 3rd digit between MCP and PIP and is facing towards thumb. Due to location STAT referral made to Hand Surgery for evaluation. RX for Doxycycline, 10 day course to start now. Ibuprofen for mild-moderate pain, Vicodin for severe pain. Splint applied to digit to immobilize. Patient instructed to call me tomorrow if he did not receive a call back regarding his referral. He verbalized understanding.

## 2015-03-20 NOTE — Patient Instructions (Addendum)
Start Doxycycline antibiotic. Take 1 tablet by mouth twice daily for 10 days. Try taking ibuprofen for pain and inflammation for mild to moderate pain. You may take the hydrocodone/acetaminophen for severe pain. Take 1/2 - 1 tablet by mouth every 8 hours as needed. You will be contacted regarding your referral to Hand Surgery.  Please let us know if you have not heard back within one week. Please keep splint on to prevent hook from moving.

## 2015-03-21 ENCOUNTER — Telehealth: Payer: Self-pay

## 2015-03-21 DIAGNOSIS — S6982XA Other specified injuries of left wrist, hand and finger(s), initial encounter: Secondary | ICD-10-CM | POA: Diagnosis not present

## 2015-03-21 NOTE — Telephone Encounter (Signed)
[facility] called report for lt 3rd finger; advised [facility] that Allie Bossier NP has already notified pt and pt is to see hand surgeon this AM.

## 2015-03-22 DIAGNOSIS — M60242 Foreign body granuloma of soft tissue, not elsewhere classified, left hand: Secondary | ICD-10-CM | POA: Diagnosis not present

## 2015-03-29 DIAGNOSIS — Z4789 Encounter for other orthopedic aftercare: Secondary | ICD-10-CM | POA: Diagnosis not present

## 2015-05-15 ENCOUNTER — Other Ambulatory Visit: Payer: Self-pay | Admitting: *Deleted

## 2015-05-15 MED ORDER — LOSARTAN POTASSIUM 100 MG PO TABS: ORAL_TABLET | ORAL | Status: DC

## 2015-05-16 ENCOUNTER — Other Ambulatory Visit: Payer: Self-pay

## 2015-05-16 MED ORDER — LOSARTAN POTASSIUM 100 MG PO TABS: ORAL_TABLET | ORAL | Status: DC

## 2015-05-16 NOTE — Telephone Encounter (Signed)
Refill sent for losartan potassium 100 mg

## 2015-07-01 ENCOUNTER — Ambulatory Visit (INDEPENDENT_AMBULATORY_CARE_PROVIDER_SITE_OTHER): Payer: Medicare Other | Admitting: Cardiovascular Disease

## 2015-07-01 ENCOUNTER — Encounter: Payer: Self-pay | Admitting: Cardiovascular Disease

## 2015-07-01 VITALS — BP 106/60 | HR 83 | Ht 70.0 in | Wt 226.0 lb

## 2015-07-01 DIAGNOSIS — I10 Essential (primary) hypertension: Secondary | ICD-10-CM | POA: Diagnosis not present

## 2015-07-01 DIAGNOSIS — I5022 Chronic systolic (congestive) heart failure: Secondary | ICD-10-CM | POA: Diagnosis not present

## 2015-07-01 NOTE — Assessment & Plan Note (Signed)
The patient seems to be doing well overall and currently New York Heart Association class II. There is no evidence of fluid overload. Continue treatment with carvedilol and losartan. Most recent ejection fraction was 50-55% in 2013.

## 2015-07-01 NOTE — Progress Notes (Signed)
HPI  This is a 64 year old man who is here today for a followup visit.  He has known history of nonischemic cardiomyopathy which was diagnosed in 2010. He had cardiac catheterization done which showed minor luminal irregularities without obstructive CAD. Ejection fraction was 35%. Most recent ejection fraction was 50-55% in May of  2013 .  He has other chronic medical conditions that include diabetes, frequent PACs and essential hypertension.  He has been doing very well and denies any chest pain or worsening dyspnea. No palpitations, dizziness or syncope.   No Known Allergies   Current Outpatient Prescriptions on File Prior to Visit  Medication Sig Dispense Refill  . aspirin 81 MG tablet Take 81 mg by mouth 2 (two) times daily.    . carvedilol (COREG) 25 MG tablet TAKE 1 TABLET BY MOUTH TWICE A DAY WITH A MEAL 180 tablet 3  . doxazosin (CARDURA) 1 MG tablet TAKE 1 TABLET (1 MG TOTAL) BY MOUTH AT BEDTIME. 90 tablet 3  . fish oil-omega-3 fatty acids 1000 MG capsule Take 1 g by mouth 2 (two) times daily.     . Lancets (ONETOUCH ULTRASOFT) lancets USE DAILY AS NEEDED TO CHECK SUGAR, DX 250.00 100 each 2  . lansoprazole (PREVACID) 30 MG capsule TAKE 1 CAPSULE (30 MG TOTAL) BY MOUTH DAILY. 90 capsule 0  . losartan (COZAAR) 100 MG tablet 1/2 TABLET EVERY MORNING 30 tablet 0  . metFORMIN (GLUCOPHAGE) 500 MG tablet Take 1 tablet (500 mg total) by mouth daily with breakfast. 90 tablet 3  . ONE TOUCH ULTRA TEST test strip CHECK BLOOD SUGAR DAILY AND IF BLOOD SUGAR FLUCTUATES. 250.00 100 each 11  . PARoxetine (PAXIL) 20 MG tablet TAKE 1 TABLET (20 MG TOTAL) BY MOUTH DAILY. 90 tablet 3  . pravastatin (PRAVACHOL) 40 MG tablet TAKE 1 TABLET BY MOUTH AT BEDTIME 90 tablet 3   No current facility-administered medications on file prior to visit.     Past Medical History  Diagnosis Date  . Diabetes mellitus 11/2006    Type II  . Hyperlipidemia 04/1998  . Hypertension 1995  . BPH (benign prostatic  hypertrophy) 2004  . Sinus tachycardia     Pt says he has been told his heart rate is high since he was in high school.   . Holter monitor, abnormal 7/10    Frequent PAC's and PVC's  Average HR 96  (but he had started Toprol XL).  HR range 71-130  . Obesity   . Hip pain   . Cardiomyopathy     ? tachycardia-mediated  . Echocardiogram abnormal 7/10    Mild LVH with a mildly dilated LV.  EF was difficult to assess given poor acoustic windows but appeared moderately decreased.  Mild MR.  Probable small perimembranous VSD, mild LAE  . History of MRI 7/10    Cardiac MRI was done to followup difficult echo.  This showed mild to moderately dilated LV, mild to moderate LAE, EF  33% with global hypokinesis, normal RV size with mild systolic dysfunction.  There was no large VSD evident.  There was no myocardial delayed enhancement so no evidence for infiltrative disease or infarction.  LHC (7/10) showed only luminal irreg. in the coronaries and EF 35%  . Abnormal echocardiogram 2/11    Repeated after med mgmt of cardiomyopathy showed EF 40-45%, mildly dilated LV, mild LV hypertrophy, anterolateral and apical hypokinesis, mild diastolic dysfunction.  . VSD (ventricular septal defect)     Probable small perimembranous  .  Diverticulosis   . Depression   . Esophageal erosions   . OSA (obstructive sleep apnea)     Not using CPAP  . GERD (gastroesophageal reflux disease)   . Cancer     melanoma back  . Arthritis   . Anemia     hx  . Pneumonia     HX OF pna  . Blood transfusion without reported diagnosis 09/2012     Past Surgical History  Procedure Laterality Date  . Rotator cuff repair  2/11    rt  . Inguinal hernia repair  1985    Left  . Laminectomy  1987    with discectomy  . Inguinal hernia repair  1999    Right  . Total hip arthroplasty  2011    Left  . Esophagogastroduodenoscopy  2012    1)Barrett's esoph, 2) Erosive esophagitis, 3) Mild gastritis in the antrum, 4) Duodenitis in  the bulb of duodenum, 5) Small hiatal hernia  . Colonoscopy w/ biopsies  08/24/2006    Divertics, polyps x3, bx negative, repeat 2012 with mild diverticulosis in teh sigmoid colon and 1 polyp in tranverse colon, repeat due 2017  . Knee arthroscopy  10/15/2011    Procedure: ARTHROSCOPY KNEE;  Surgeon: Ninetta Lights, MD;  Location: [facility];  Service: Orthopedics;  Laterality: Right;  right knee scope with lateral meniscectomy, removal loose foreign body, and microfracture technique  . Cardiac catheterization  05/30/2009    Dilated cardiomyopathy.  min VSD.  Hypokin Inf  wall.  Multiple min luminal Irr.  EF 35%  . Total knee arthroplasty  06/01/2012    Procedure: TOTAL KNEE ARTHROPLASTY;  Surgeon: Ninetta Lights, MD;  Location: [facility];  Service: Orthopedics;  Laterality: Right;     Family History  Problem Relation Age of Onset  . Heart disease Father     CABG  . Stroke Father   . Diabetes Father   . Hypertension Mother   . Alzheimer's disease Mother   . Diabetes Sister   . Hypertension Sister   . Cancer Sister     uterine cancer  . Diabetes Brother   . Throat cancer Brother     Throat  . Diabetes Maternal Grandmother     Insulin  . Drug abuse Neg Hx   . Prostate cancer Neg Hx   . Diabetes Sister   . Hypertension Sister   . Throat cancer Other     Throat  . Diabetes Other   . Alcohol abuse Other   . Colon cancer Cousin      Social History   Social History  . Marital Status: Single    Spouse Name: N/A  . Number of Children: N/A  . Years of Education: N/A   Occupational History  . ADJUSTER   . [place] History Main Topics  . Smoking status: Former Smoker -- 20 years    Types: Cigarettes    Quit date: 11/09/1998  . Smokeless tobacco: Former Systems developer    Quit date: 06/03/2007     Comment: no alcohol since nov 12  . Alcohol Use: No     Comment: sober as 09/2011  . Drug Use: No  . Sexual Activity: Not Currently    Other Topics Concern  . Not on file   Social History Narrative   Lives in [place], his daughter and her kids moved in with him (2015)   Divorced  PHYSICAL EXAM   BP 106/60 mmHg  Pulse 83  Ht 5\' 10"  (1.778 m)  Wt 226 lb (102.513 kg)  BMI 32.43 kg/m2 Constitutional: He is oriented to person, place, and time. He appears well-developed and well-nourished. No distress.  HENT: No nasal discharge.  Head: Normocephalic and atraumatic.  Eyes: Pupils are equal and round. Right eye exhibits no discharge. Left eye exhibits no discharge.  Neck: Normal range of motion. Neck supple. No JVD present. No thyromegaly present.  Cardiovascular: Normal rate, regular rhythm, normal heart sounds and. Exam reveals no gallop and no friction rub. No murmur heard.  Pulmonary/Chest: Effort normal and breath sounds normal. No stridor. No respiratory distress. He has no wheezes. He has no rales. He exhibits no tenderness.  Abdominal: Soft. Bowel sounds are normal. He exhibits no distension. There is no tenderness. There is no rebound and no guarding.  Musculoskeletal: Normal range of motion. He exhibits no edema and no tenderness.  Neurological: He is alert and oriented to person, place, and time. Coordination normal.  Skin: Skin is warm and dry. No rash noted. He is not diaphoretic. No erythema. No pallor.  Psychiatric: He has a normal mood and affect. His behavior is normal. Judgment and thought content normal.       EKG: Sinus  Rhythm  - frequent PAC s  # PACs = 4. WITHIN NORMAL LIMITS   ASSESSMENT AND PLAN

## 2015-07-01 NOTE — Patient Instructions (Signed)
Medication Instructions: Continue same medications.   Labwork: None.   Procedures/Testing: None.   Follow-Up: 1 year with Dr. Casha Estupinan  Any Additional Special Instructions Will Be Listed Below (If Applicable).   

## 2015-07-01 NOTE — Assessment & Plan Note (Signed)
Blood pressure is controlled on current medications. 

## 2015-07-10 ENCOUNTER — Other Ambulatory Visit: Payer: Self-pay

## 2015-07-10 MED ORDER — LOSARTAN POTASSIUM 100 MG PO TABS: ORAL_TABLET | ORAL | Status: DC

## 2015-07-10 NOTE — Telephone Encounter (Signed)
Refill sent for Losartan Potassium 100 mg

## 2015-07-16 ENCOUNTER — Other Ambulatory Visit: Payer: Self-pay | Admitting: *Deleted

## 2015-07-16 MED ORDER — LOSARTAN POTASSIUM 100 MG PO TABS: ORAL_TABLET | ORAL | Status: DC

## 2015-07-17 ENCOUNTER — Other Ambulatory Visit: Payer: Self-pay | Admitting: *Deleted

## 2015-07-17 MED ORDER — LOSARTAN POTASSIUM 100 MG PO TABS: ORAL_TABLET | ORAL | Status: DC

## 2015-07-21 ENCOUNTER — Other Ambulatory Visit: Payer: Self-pay | Admitting: Family Medicine

## 2015-08-08 ENCOUNTER — Other Ambulatory Visit: Payer: Self-pay | Admitting: Family Medicine

## 2015-08-11 ENCOUNTER — Other Ambulatory Visit: Payer: Self-pay | Admitting: Family Medicine

## 2015-08-11 DIAGNOSIS — E119 Type 2 diabetes mellitus without complications: Secondary | ICD-10-CM

## 2015-08-16 ENCOUNTER — Other Ambulatory Visit (INDEPENDENT_AMBULATORY_CARE_PROVIDER_SITE_OTHER): Payer: Medicare Other

## 2015-08-16 DIAGNOSIS — E119 Type 2 diabetes mellitus without complications: Secondary | ICD-10-CM

## 2015-08-16 LAB — LIPID PANEL
CHOL/HDL RATIO: 3
Cholesterol: 169 mg/dL (ref 0–200)
HDL: 49.2 mg/dL (ref >39.00)
LDL CALC: 94 mg/dL (ref 0–99)
NONHDL: 120.08
Triglycerides: 128 mg/dL (ref 0.0–149.0)
VLDL: 25.6 mg/dL (ref 0.0–40.0)

## 2015-08-16 LAB — COMPREHENSIVE METABOLIC PANEL
ALK PHOS: 57 U/L (ref 39–117)
ALT: 18 U/L (ref 0–53)
AST: 21 U/L (ref 0–37)
Albumin: 4.2 g/dL (ref 3.5–5.2)
BUN: 19 mg/dL (ref 6–23)
CHLORIDE: 105 meq/L (ref 96–112)
CO2: 30 meq/L (ref 19–32)
Calcium: 9.2 mg/dL (ref 8.4–10.5)
Creatinine, Ser: 0.92 mg/dL (ref 0.40–1.50)
GFR: 88.14 mL/min (ref >60.00)
GLUCOSE: 162 mg/dL — AB (ref 70–99)
POTASSIUM: 3.9 meq/L (ref 3.5–5.1)
SODIUM: 142 meq/L (ref 135–145)
TOTAL PROTEIN: 7 g/dL (ref 6.0–8.3)
Total Bilirubin: 0.9 mg/dL (ref 0.2–1.2)

## 2015-08-16 LAB — HEMOGLOBIN A1C: HEMOGLOBIN A1C: 7 % — AB (ref 4.6–6.5)

## 2015-08-20 ENCOUNTER — Ambulatory Visit (INDEPENDENT_AMBULATORY_CARE_PROVIDER_SITE_OTHER): Payer: Medicare Other | Admitting: Family Medicine

## 2015-08-20 ENCOUNTER — Encounter: Payer: Self-pay | Admitting: Family Medicine

## 2015-08-20 ENCOUNTER — Ambulatory Visit (INDEPENDENT_AMBULATORY_CARE_PROVIDER_SITE_OTHER)
Admission: RE | Admit: 2015-08-20 | Discharge: 2015-08-20 | Disposition: A | Discharge status: Home / Self Care | Payer: Medicare Other | Source: Ambulatory Visit | Attending: Family Medicine | Admitting: Family Medicine

## 2015-08-20 VITALS — BP 104/68 | HR 56 | Temp 98.7°F | Ht 69.0 in | Wt 222.0 lb

## 2015-08-20 DIAGNOSIS — M79642 Pain in left hand: Secondary | ICD-10-CM

## 2015-08-20 DIAGNOSIS — I10 Essential (primary) hypertension: Secondary | ICD-10-CM

## 2015-08-20 DIAGNOSIS — K4090 Unilateral inguinal hernia, without obstruction or gangrene, not specified as recurrent: Secondary | ICD-10-CM

## 2015-08-20 DIAGNOSIS — Z0000 Encounter for general adult medical examination without abnormal findings: Secondary | ICD-10-CM

## 2015-08-20 DIAGNOSIS — Z119 Encounter for screening for infectious and parasitic diseases, unspecified: Secondary | ICD-10-CM

## 2015-08-20 DIAGNOSIS — Z23 Encounter for immunization: Secondary | ICD-10-CM | POA: Diagnosis not present

## 2015-08-20 DIAGNOSIS — E119 Type 2 diabetes mellitus without complications: Secondary | ICD-10-CM

## 2015-08-20 DIAGNOSIS — Z114 Encounter for screening for human immunodeficiency virus [HIV]: Secondary | ICD-10-CM

## 2015-08-20 DIAGNOSIS — Z7189 Other specified counseling: Secondary | ICD-10-CM

## 2015-08-20 DIAGNOSIS — M19042 Primary osteoarthritis, left hand: Secondary | ICD-10-CM | POA: Diagnosis not present

## 2015-08-20 MED ORDER — PAROXETINE HCL 20 MG PO TABS: ORAL_TABLET | ORAL | Status: DC

## 2015-08-20 MED ORDER — METFORMIN HCL 500 MG PO TABS: 500.0000 mg | ORAL_TABLET | Freq: Every day | ORAL | Status: DC

## 2015-08-20 MED ORDER — PRAVASTATIN SODIUM 40 MG PO TABS: 40.0000 mg | ORAL_TABLET | Freq: Every day | ORAL | Status: DC

## 2015-08-20 MED ORDER — LANSOPRAZOLE 30 MG PO CPDR: DELAYED_RELEASE_CAPSULE | ORAL | Status: DC

## 2015-08-20 MED ORDER — DOXAZOSIN MESYLATE 1 MG PO TABS: ORAL_TABLET | ORAL | Status: DC

## 2015-08-20 MED ORDER — LOSARTAN POTASSIUM 100 MG PO TABS: ORAL_TABLET | ORAL | Status: DC

## 2015-08-20 MED ORDER — CARVEDILOL 25 MG PO TABS: ORAL_TABLET | ORAL | Status: DC

## 2015-08-20 NOTE — Patient Instructions (Addendum)
Check with your insurance to see if they will cover the shingles shot. Call about an eye exam when you get a chance.   Call GI in 2017 if they don't call you first.  Go to the lab on the way out.  We'll contact you with your xray report.  Recheck in about 6 months, labs ahead of time.  Take care.  Glad to see you.

## 2015-08-20 NOTE — Progress Notes (Signed)
Pre visit review using our clinic review tool, if applicable. No additional management support is needed unless otherwise documented below in the visit note.  I have personally reviewed the Medicare Annual Wellness questionnaire and have noted 1. The patient's medical and social history 2. Their use of alcohol, tobacco or illicit drugs 3. Their current medications and supplements 4. The patient's functional ability including ADL's, fall risks, home safety risks and hearing or visual             impairment. 5. Diet and physical activities 6. Evidence for depression or mood disorders  The patients weight, height, BMI have been recorded in the chart and visual acuity is per eye clinic.  I have made referrals, counseling and provided education to the patient based review of the above and I have provided the pt with a written personalized care plan for preventive services.  Provider list updated- see scanned forms.  Routine anticipatory guidance given to patient.  See health maintenance.  Flu 2016 Shingles 2013 PNA 2013 Tetanus 2014 Colonoscopy due 2017,d/w pt.  Prostate cancer screening and PSA options (with potential risks and benefits of testing vs not testing) were discussed along with recent recs/guidelines.  He declined testing PSA at this point. Advance directive- daughter designated if patient were incapacitated.  Cognitive function addressed- see scanned forms- and if abnormal then additional documentation follows.  Opts in for HIV and HCV screening with next set of labs.    L lower abd mass. Variable size. occ discomfort, mild.  Present for years.  Noted with position change.  No GI sx attributed.    L hand pain with puffy MCP, esp 2nd - 4th MCP.  Pain opening cans, bowling.  L handed.  Ibuprofen didn't help.    DM2.  Max sugar 175 at home.  No low.  No foot sx.  Doing well, compliant.  Working on [employer] and diet.  Labs d/w pt.   Hypertension:    Using medication without  problems or lightheadedness: yes Chest pain with exertion:no Edema:no Short of breath:no  PMH and SH reviewed  Meds, vitals, and allergies reviewed.   ROS: See HPI.  Otherwise negative.    GEN: nad, alert and oriented HEENT: mucous membranes moist NECK: supple w/o LA CV: rrr. PULM: ctab, no inc wob ABD: soft, +bs, soft L lower abd inguinal henia noted, easily reduced, not ttp EXT: no edema SKIN: no acute rash L 2nd MCP ttp and puffy.  Slight palmar contraction noted on L hand, mid palm, still with nearly 100 extension.  No other joint erythema.    Diabetic foot exam: Normal inspection except for old insect bite on R foot that he had scratched- not infected appearing.   No skin breakdown No calluses  Normal DP pulses Normal sensation to light touch and monofilament Nails normal

## 2015-08-21 DIAGNOSIS — M79642 Pain in left hand: Secondary | ICD-10-CM | POA: Insufficient documentation

## 2015-08-21 DIAGNOSIS — K439 Ventral hernia without obstruction or gangrene: Secondary | ICD-10-CM | POA: Insufficient documentation

## 2015-08-21 NOTE — Assessment & Plan Note (Signed)
Advance directive- daughter designated if patient were incapacitated.   

## 2015-08-21 NOTE — Assessment & Plan Note (Signed)
Anatomy d/w pt.  He isn't enthused about repair.  Present for years.   Minimally bothersome.  He'll update me as needed.  We can refer to gen surgery prn.  Routine cautions given.  He agrees.

## 2015-08-21 NOTE — Assessment & Plan Note (Signed)
Flu 2016  Shingles 2013  PNA 2013  Tetanus 2014  Colonoscopy due 2017,d/w pt.  Prostate cancer screening and PSA options (with potential risks and benefits of testing vs not testing) were discussed along with recent recs/guidelines. He declined testing PSA at this point.  Advance directive- daughter designated if patient were incapacitated.  Cognitive function addressed- see scanned forms- and if abnormal then additional documentation follows.  Opts in for HIV and HCV screening with next set of labs.

## 2015-08-21 NOTE — Assessment & Plan Note (Signed)
I want to check plain films and then we'll be in touch with patient.  He agrees.

## 2015-08-21 NOTE — Assessment & Plan Note (Signed)
Controlled, continue as is.  No change in meds at this point.  Labs d/ wpt.  Continue work on [employer].

## 2015-08-21 NOTE — Assessment & Plan Note (Addendum)
Controlled, continue as is.  Labs d/w pt. No change in meds.  Reasonable control of lipids.  Continue diet and exercise.

## 2015-08-23 ENCOUNTER — Other Ambulatory Visit: Payer: Self-pay | Admitting: Family Medicine

## 2015-08-23 DIAGNOSIS — M79642 Pain in left hand: Secondary | ICD-10-CM

## 2015-09-05 DIAGNOSIS — M19049 Primary osteoarthritis, unspecified hand: Secondary | ICD-10-CM | POA: Insufficient documentation

## 2015-09-05 DIAGNOSIS — M19042 Primary osteoarthritis, left hand: Secondary | ICD-10-CM | POA: Diagnosis not present

## 2015-09-06 ENCOUNTER — Other Ambulatory Visit: Payer: Self-pay | Admitting: Family Medicine

## 2015-09-16 DIAGNOSIS — D2272 Melanocytic nevi of left lower limb, including hip: Secondary | ICD-10-CM | POA: Diagnosis not present

## 2015-09-16 DIAGNOSIS — D2261 Melanocytic nevi of right upper limb, including shoulder: Secondary | ICD-10-CM | POA: Diagnosis not present

## 2015-09-16 DIAGNOSIS — L570 Actinic keratosis: Secondary | ICD-10-CM | POA: Diagnosis not present

## 2015-09-16 DIAGNOSIS — X32XXXA Exposure to sunlight, initial encounter: Secondary | ICD-10-CM | POA: Diagnosis not present

## 2015-09-16 DIAGNOSIS — D485 Neoplasm of uncertain behavior of skin: Secondary | ICD-10-CM | POA: Diagnosis not present

## 2015-09-16 DIAGNOSIS — Z85828 Personal history of other malignant neoplasm of skin: Secondary | ICD-10-CM | POA: Diagnosis not present

## 2015-09-16 DIAGNOSIS — L821 Other seborrheic keratosis: Secondary | ICD-10-CM | POA: Diagnosis not present

## 2015-09-18 ENCOUNTER — Other Ambulatory Visit: Payer: Self-pay | Admitting: Family Medicine

## 2015-10-19 ENCOUNTER — Other Ambulatory Visit: Payer: Self-pay | Admitting: Family Medicine

## 2015-12-02 ENCOUNTER — Encounter: Payer: Self-pay | Admitting: Gastroenterology

## 2015-12-17 ENCOUNTER — Encounter: Payer: Self-pay | Admitting: Gastroenterology

## 2015-12-25 ENCOUNTER — Encounter: Payer: Self-pay | Admitting: Gastroenterology

## 2015-12-27 ENCOUNTER — Other Ambulatory Visit: Payer: Self-pay | Admitting: Family Medicine

## 2016-02-12 ENCOUNTER — Ambulatory Visit (AMBULATORY_SURGERY_CENTER): Payer: Self-pay | Admitting: *Deleted

## 2016-02-12 VITALS — Ht 70.0 in | Wt 236.2 lb

## 2016-02-12 DIAGNOSIS — Z1211 Encounter for screening for malignant neoplasm of colon: Secondary | ICD-10-CM

## 2016-02-12 DIAGNOSIS — K2270 Barrett's esophagus without dysplasia: Secondary | ICD-10-CM

## 2016-02-12 MED ORDER — NA SULFATE-K SULFATE-MG SULF 17.5-3.13-1.6 GM/177ML PO SOLN: ORAL | Status: DC

## 2016-02-12 NOTE — Progress Notes (Signed)
No allergies to eggs or soy. No problems with anesthesia.  Pt given Emmi instructions for colonoscopy and egd  No oxygen use  No diet drug use

## 2016-02-18 ENCOUNTER — Ambulatory Visit: Payer: Medicare Other | Admitting: Family Medicine

## 2016-02-20 ENCOUNTER — Ambulatory Visit: Payer: Medicare Other | Admitting: Family Medicine

## 2016-02-26 ENCOUNTER — Encounter: Payer: Self-pay | Admitting: Gastroenterology

## 2016-02-26 ENCOUNTER — Ambulatory Visit (AMBULATORY_SURGERY_CENTER): Payer: Medicare Other | Admitting: Gastroenterology

## 2016-02-26 VITALS — BP 110/73 | HR 69 | Temp 97.3°F | Resp 16 | Ht 70.0 in | Wt 236.0 lb

## 2016-02-26 DIAGNOSIS — E119 Type 2 diabetes mellitus without complications: Secondary | ICD-10-CM | POA: Diagnosis not present

## 2016-02-26 DIAGNOSIS — I10 Essential (primary) hypertension: Secondary | ICD-10-CM | POA: Diagnosis not present

## 2016-02-26 DIAGNOSIS — G4733 Obstructive sleep apnea (adult) (pediatric): Secondary | ICD-10-CM | POA: Diagnosis not present

## 2016-02-26 DIAGNOSIS — K219 Gastro-esophageal reflux disease without esophagitis: Secondary | ICD-10-CM | POA: Diagnosis not present

## 2016-02-26 DIAGNOSIS — K2270 Barrett's esophagus without dysplasia: Secondary | ICD-10-CM

## 2016-02-26 DIAGNOSIS — E669 Obesity, unspecified: Secondary | ICD-10-CM | POA: Diagnosis not present

## 2016-02-26 DIAGNOSIS — K449 Diaphragmatic hernia without obstruction or gangrene: Secondary | ICD-10-CM | POA: Diagnosis not present

## 2016-02-26 DIAGNOSIS — I509 Heart failure, unspecified: Secondary | ICD-10-CM | POA: Diagnosis not present

## 2016-02-26 DIAGNOSIS — I429 Cardiomyopathy, unspecified: Secondary | ICD-10-CM | POA: Diagnosis not present

## 2016-02-26 DIAGNOSIS — Z1211 Encounter for screening for malignant neoplasm of colon: Secondary | ICD-10-CM | POA: Diagnosis not present

## 2016-02-26 LAB — GLUCOSE, CAPILLARY
Glucose-Capillary: 178 mg/dL — ABNORMAL HIGH (ref 65–99)
Glucose-Capillary: 155 mg/dL — ABNORMAL HIGH (ref 65–99)

## 2016-02-26 MED ORDER — SODIUM CHLORIDE 0.9 % IV SOLN: 500.0000 mL | INTRAVENOUS | Status: DC

## 2016-02-26 MED ORDER — OMEPRAZOLE 20 MG PO CPDR: 20.0000 mg | DELAYED_RELEASE_CAPSULE | Freq: Every day | ORAL | Status: DC

## 2016-02-26 NOTE — Patient Instructions (Signed)

## 2016-02-26 NOTE — Progress Notes (Signed)
Called to room to assist during endoscopic procedure.  Patient ID and intended procedure confirmed with present staff. Received instructions for my participation in the procedure from the performing physician.  

## 2016-02-26 NOTE — Op Note (Signed)
[facility] Patient Name: Oakley Coggins Procedure Date: 02/26/2016 8:26 AM MRN: [id number] Endoscopist: Ladene Artist , MD Age: 65 Date of Birth: 06/19/1951 Gender: Male Procedure:                Colonoscopy Indications:              Screening for colorectal malignant neoplasm Medicines:                Monitored Anesthesia Care Procedure:                Pre-Anesthesia Assessment:                           - Prior to the procedure, a History and Physical                            was performed, and patient medications and                            allergies were reviewed. The patient's tolerance of                            previous anesthesia was also reviewed. The risks                            and benefits of the procedure and the sedation                            options and risks were discussed with the patient.                            All questions were answered, and informed consent                            was obtained. Prior Anticoagulants: The patient has                            taken no previous anticoagulant or antiplatelet                            agents. ASA Grade Assessment: II - A patient with                            mild systemic disease. After reviewing the risks                            and benefits, the patient was deemed in                            satisfactory condition to undergo the procedure.                           After obtaining informed consent, the colonoscope  was passed under direct vision. Throughout the                            procedure, the patient's blood pressure, pulse, and                            oxygen saturations were monitored continuously. The                            Model [id number] scope was introduced                            through the anus and advanced to the the cecum,                            identified by appendiceal orifice and ileocecal                      valve. Anatomical landmarks were photographed. The                            quality of the bowel preparation was good. The                            colonoscopy was performed without difficulty. The                            patient tolerated the procedure well. Scope In: 8:43:14 AM Scope Out: 8:57:16 AM Scope Withdrawal Time: 0 hours 10 minutes 51 seconds  Total Procedure Duration: 0 hours 14 minutes 2 seconds  Findings:                 Internal hemorrhoids were found during                            retroflexion. The hemorrhoids were small and Grade                            I (internal hemorrhoids that do not prolapse).                           Scattered small-mouthed diverticula were found in                            the sigmoid colon and transverse colon. There was                            no evidence of diverticular bleeding.                           The exam was otherwise normal throughout the                            examined colon. Complications:            No immediate complications. Estimated blood loss:  None. Estimated Blood Loss:     Estimated blood loss: none. Impression:               - Internal hemorrhoids.                           - Mild diverticulosis in the sigmoid colon and in                            the transverse colon. Recommendation:           - Repeat colonoscopy in 10 years for screening                            purposes.                           - Patient has a contact number available for                            emergencies. The signs and symptoms of potential                            delayed complications were discussed with the                            patient. Return to normal activities tomorrow.                            Written discharge instructions were provided to the                            patient.                           - Resume previous diet.                            - Continue present medications. Ladene Artist, MD 02/26/2016 9:11:13 AM This report has been signed electronically.

## 2016-02-26 NOTE — Progress Notes (Signed)
Stable during procedure. Noted prior to sedation and during procedure EKG rhythm NSR with PVC's and PAC's

## 2016-02-26 NOTE — Op Note (Signed)
[facility] Patient Name: Tim Mccullough Procedure Date: 02/26/2016 8:59 AM MRN: [id number] Endoscopist: Ladene Artist , MD Age: 65 Date of Birth: [birth year] Gender: Male Procedure:                Upper GI endoscopy Indications:              Surveillance for malignancy due to personal history                            of Barrett's esophagus Medicines:                Monitored Anesthesia Care Procedure:                Pre-Anesthesia Assessment:                           - Prior to the procedure, a History and Physical                            was performed, and patient medications and                            allergies were reviewed. The patient's tolerance of                            previous anesthesia was also reviewed. The risks                            and benefits of the procedure and the sedation                            options and risks were discussed with the patient.                            All questions were answered, and informed consent                            was obtained. Prior Anticoagulants: The patient has                            taken no previous anticoagulant or antiplatelet                            agents. ASA Grade Assessment: II - A patient with                            mild systemic disease. After reviewing the risks                            and benefits, the patient was deemed in                            satisfactory condition to undergo the procedure.                           -  Prior to the procedure, a History and Physical                            was performed, and patient medications and                            allergies were reviewed. The patient's tolerance of                            previous anesthesia was also reviewed. The risks                            and benefits of the procedure and the sedation                            options and risks were discussed with the patient.                             All questions were answered, and informed consent                            was obtained. Prior Anticoagulants: The patient has                            taken no previous anticoagulant or antiplatelet                            agents. ASA Grade Assessment: II - A patient with                            mild systemic disease. After reviewing the risks                            and benefits, the patient was deemed in                            satisfactory condition to undergo the procedure.                           After obtaining informed consent, the endoscope was                            passed under direct vision. Throughout the                            procedure, the patient's blood pressure, pulse, and                            oxygen saturations were monitored continuously. The                            Model [id number]) scope was introduced  through the mouth, and advanced to the second part                            of duodenum. The upper GI endoscopy was                            accomplished without difficulty. The patient                            tolerated the procedure well. Scope In: Scope Out: Findings:                 The esophagus and gastroesophageal junction were                            examined with white light from a forward view and                            retroflexed position. There were esophageal mucosal                            changes secondary to established long-segment                            Barrett's disease. These changes involved the                            mucosa extending to the Z-line (31 cm from the                            incisors). Diffuse salmon-colored mucosa was                            present from 31 to 38 cm. The maximum longitudinal                            extent of these esophageal mucosal changes was 7 cm                            in length. Mucosa was biopsied with a  cold forceps                            for histology in 4 quadrants at intervals of 1 cm                            in the lower third of the esophagus. One specimen                            bottle was sent to pathology.                           The exam of the esophagus was otherwise normal.  A small hiatal hernia was present.                           The exam of the stomach was otherwise normal.                           One 3 mm angioectasia without bleeding was found in                            the duodenal bulb.                           The second portion of the duodenum was normal. Complications:            No immediate complications. Estimated Blood Loss:     Estimated blood loss: none. Impression:               - Esophageal mucosal changes secondary to                            established long-segment Barrett's disease.                            Biopsied.                           - Small hiatal hernia.                           - One small angioectasia in the duodenal bulb Recommendation:           - Patient has a contact number available for                            emergencies. The signs and symptoms of potential                            delayed complications were discussed with the                            patient. Return to normal activities tomorrow.                            Written discharge instructions were provided to the                            patient.                           - Resume previous diet.                           - Continue present medications including a PPI                            daily long term.                           -  Await pathology results.                           - Repeat upper endoscopy in 3 years for                            surveillance if no dysplasia. Ladene Artist, MD 02/26/2016 9:18:41 AM This report has been signed electronically.

## 2016-02-27 ENCOUNTER — Telehealth: Payer: Self-pay | Admitting: Family Medicine

## 2016-02-27 ENCOUNTER — Ambulatory Visit: Payer: Medicare Other | Admitting: Family Medicine

## 2016-02-27 ENCOUNTER — Telehealth: Payer: Self-pay

## 2016-02-27 NOTE — Telephone Encounter (Signed)
  Follow up Call-  Call back number 02/26/2016  Post procedure Call Back phone  # [phone]  Permission to leave phone message Yes     Patient questions:  Do you have a fever, pain , or abdominal swelling? No. Pain Score  0 *  Have you tolerated food without any problems? Yes.    Have you been able to return to your normal activities? Yes.    Do you have any questions about your discharge instructions: Diet   No. Medications  No. Follow up visit  No.  Do you have questions or concerns about your Care? No.  Actions: * If pain score is 4 or above: No action needed, pain <4.

## 2016-02-27 NOTE — Telephone Encounter (Signed)
Pt was here at 10:15 for a 10:30 appt. He played the voicemail on his phone that stated 10:30 appt for 4/13, he was confused. Pt was rescheduled on 4/24. Is it ok to cancel or no show?

## 2016-02-27 NOTE — Telephone Encounter (Signed)
Thanks

## 2016-02-27 NOTE — Telephone Encounter (Signed)
Please don't charge him.  Make it a cancel.  Thanks.

## 2016-03-02 ENCOUNTER — Telehealth: Payer: Self-pay | Admitting: Family Medicine

## 2016-03-02 ENCOUNTER — Other Ambulatory Visit: Payer: Self-pay | Admitting: Family Medicine

## 2016-03-02 ENCOUNTER — Ambulatory Visit (INDEPENDENT_AMBULATORY_CARE_PROVIDER_SITE_OTHER): Payer: Medicare Other | Admitting: Family Medicine

## 2016-03-02 ENCOUNTER — Encounter: Payer: Self-pay | Admitting: Family Medicine

## 2016-03-02 VITALS — BP 112/62 | HR 66 | Temp 97.8°F | Wt 230.0 lb

## 2016-03-02 DIAGNOSIS — Z119 Encounter for screening for infectious and parasitic diseases, unspecified: Secondary | ICD-10-CM | POA: Diagnosis not present

## 2016-03-02 DIAGNOSIS — Z1159 Encounter for screening for other viral diseases: Secondary | ICD-10-CM | POA: Diagnosis not present

## 2016-03-02 DIAGNOSIS — E1165 Type 2 diabetes mellitus with hyperglycemia: Secondary | ICD-10-CM | POA: Diagnosis not present

## 2016-03-02 DIAGNOSIS — Z114 Encounter for screening for human immunodeficiency virus [HIV]: Secondary | ICD-10-CM

## 2016-03-02 DIAGNOSIS — E119 Type 2 diabetes mellitus without complications: Secondary | ICD-10-CM | POA: Diagnosis not present

## 2016-03-02 DIAGNOSIS — IMO0001 Reserved for inherently not codable concepts without codable children: Secondary | ICD-10-CM

## 2016-03-02 LAB — HEMOGLOBIN A1C: Hgb A1c MFr Bld: 8 % — ABNORMAL HIGH (ref 4.6–6.5)

## 2016-03-02 NOTE — Progress Notes (Signed)
Pre visit review using our clinic review tool, if applicable. No additional management support is needed unless otherwise documented below in the visit note.  Still on meloxicam for L MCP OA.   Unclear benefit, d/w pt, see AVS.    H/o likely barrett's with path report pending, on PPI per GI.  D/w pt.    Diabetes:  Using medications without difficulties:yes Hypoglycemic episodes: no Hyperglycemic episodes:no Feet problems:no Blood Sugars averaging: usually ~150 eye exam within last year: due, d/w pt.  A1c pending.  "I'm slipping a little on my diet and weight." d/w pt.   Meds, vitals, and allergies reviewed.   ROS: See HPI.  Otherwise negative.    GEN: nad, alert and oriented HEENT: mucous membranes moist NECK: supple w/o LA CV: rrr. PULM: ctab, no inc wob ABD: soft, +bs EXT: no edema SKIN: no acute rash

## 2016-03-02 NOTE — Patient Instructions (Addendum)
You can stop the meloxicam to see if it truly is helping.  Go to the lab on the way out.  We'll contact you with your lab report. Take care.  Glad to see you.  Call about an eye exam.  Recheck at a physical in about 6 months, labs ahead of time.

## 2016-03-02 NOTE — Telephone Encounter (Signed)
Patient advised.

## 2016-03-02 NOTE — Telephone Encounter (Signed)
I can't do that here.  He would need to ask ortho if/how that should be done.  Thanks.

## 2016-03-02 NOTE — Telephone Encounter (Signed)
Pt called stated he has labs done today and wanted to know if you could run a test for metal content He stated he had a hip replacement in 2013

## 2016-03-03 LAB — HEPATITIS C ANTIBODY: HCV AB: NEGATIVE

## 2016-03-03 LAB — HIV ANTIBODY (ROUTINE TESTING W REFLEX): HIV: NONREACTIVE

## 2016-03-03 MED ORDER — METFORMIN HCL 500 MG PO TABS: 500.0000 mg | ORAL_TABLET | Freq: Two times a day (BID) | ORAL | Status: DC

## 2016-03-03 NOTE — Assessment & Plan Note (Signed)
See notes on labs. 

## 2016-03-04 ENCOUNTER — Encounter: Payer: Self-pay | Admitting: Gastroenterology

## 2016-04-29 DIAGNOSIS — E119 Type 2 diabetes mellitus without complications: Secondary | ICD-10-CM | POA: Diagnosis not present

## 2016-04-29 DIAGNOSIS — H04123 Dry eye syndrome of bilateral lacrimal glands: Secondary | ICD-10-CM | POA: Diagnosis not present

## 2016-04-29 LAB — HM DIABETES EYE EXAM

## 2016-05-26 ENCOUNTER — Encounter: Payer: Self-pay | Admitting: Family Medicine

## 2016-06-03 ENCOUNTER — Other Ambulatory Visit: Payer: Self-pay | Admitting: Family Medicine

## 2016-06-03 DIAGNOSIS — E1165 Type 2 diabetes mellitus with hyperglycemia: Principal | ICD-10-CM

## 2016-06-03 DIAGNOSIS — IMO0001 Reserved for inherently not codable concepts without codable children: Secondary | ICD-10-CM

## 2016-06-05 ENCOUNTER — Other Ambulatory Visit (INDEPENDENT_AMBULATORY_CARE_PROVIDER_SITE_OTHER): Payer: Medicare Other

## 2016-06-05 DIAGNOSIS — E1165 Type 2 diabetes mellitus with hyperglycemia: Secondary | ICD-10-CM

## 2016-06-05 DIAGNOSIS — IMO0001 Reserved for inherently not codable concepts without codable children: Secondary | ICD-10-CM

## 2016-06-05 LAB — HEMOGLOBIN A1C: HEMOGLOBIN A1C: 7.9 % — AB (ref 4.6–6.5)

## 2016-07-02 ENCOUNTER — Encounter: Payer: Self-pay | Admitting: Cardiovascular Disease

## 2016-07-02 ENCOUNTER — Ambulatory Visit (INDEPENDENT_AMBULATORY_CARE_PROVIDER_SITE_OTHER): Payer: Medicare Other | Admitting: Cardiovascular Disease

## 2016-07-02 VITALS — BP 100/64 | HR 88 | Ht 70.0 in | Wt 234.5 lb

## 2016-07-02 DIAGNOSIS — R0989 Other specified symptoms and signs involving the circulatory and respiratory systems: Secondary | ICD-10-CM

## 2016-07-02 DIAGNOSIS — E785 Hyperlipidemia, unspecified: Secondary | ICD-10-CM | POA: Diagnosis not present

## 2016-07-02 DIAGNOSIS — I10 Essential (primary) hypertension: Secondary | ICD-10-CM

## 2016-07-02 DIAGNOSIS — I5022 Chronic systolic (congestive) heart failure: Secondary | ICD-10-CM | POA: Diagnosis not present

## 2016-07-02 NOTE — Patient Instructions (Signed)
Medication Instructions:  Your physician recommends that you continue on your current medications as directed. Please refer to the Current Medication list given to you today.   Labwork: none  Testing/Procedures: Your physician has requested that you have a carotid duplex. This test is an ultrasound of the carotid arteries in your neck. It looks at blood flow through these arteries that supply the brain with blood. Allow one hour for this exam. There are no restrictions or special instructions.    Follow-Up: Your physician wants you to follow-up in: one year with Dr.Arida. You will receive a reminder letter in the mail two months in advance. If you don't receive a letter, please call our office to schedule the follow-up appointment.   Any Other Special Instructions Will Be Listed Below (If Applicable).     If you need a refill on your cardiac medications before your next appointment, please call your pharmacy.   

## 2016-07-02 NOTE — Progress Notes (Signed)
Cardiology Office Note   Date:  07/02/2016   ID:  Tim, Mccullough [birth year], MRN [id number]  PCP:  Elsie Stain, MD  Cardiologist:   [name], MD   Chief Complaint  Patient presents with  . Other    1 yr f/u. Meds reviewed verbally with pt.      History of Present Illness: Tim Mccullough is a 65 y.o. male who presents for A follow-up visit regarding chronic systolic heart failure due to nonischemic cardiomyopathy. This was diagnosed in 2010. Cardiac catheterization showed minor luminal irregularities without obstructive CAD. Ejection fraction was 35%. Most recent ejection fraction was 50-55% in May of  2013 .  He has other chronic medical conditions that include diabetes, Hyperlipidemia, frequent PACs and essential hypertension.  He has been doing very well and denies any chest pain or worsening dyspnea. No palpitations, dizziness or syncope. He continues to be active and goes bowling frequently.   Past Medical History:  Diagnosis Date  . Abnormal echocardiogram 2/11   Repeated after med mgmt of cardiomyopathy showed EF 40-45%, mildly dilated LV, mild LV hypertrophy, anterolateral and apical hypokinesis, mild diastolic dysfunction.  . Anemia    hx  . Arthritis   . Blood transfusion without reported diagnosis 09/2012  . BPH (benign prostatic hypertrophy) 2004  . Cancer ([place])    melanoma back  . Cardiomyopathy    ? tachycardia-mediated  . Depression   . Diabetes mellitus 11/2006   Type II  . Diverticulosis   . Echocardiogram abnormal 7/10   Mild LVH with a mildly dilated LV.  EF was difficult to assess given poor acoustic windows but appeared moderately decreased.  Mild MR.  Probable small perimembranous VSD, mild LAE  . Esophageal erosions   . GERD (gastroesophageal reflux disease)   . Hip pain   . History of MRI 7/10   Cardiac MRI was done to followup difficult echo.  This showed mild to moderately dilated LV, mild to moderate LAE, EF  33% with global  hypokinesis, normal RV size with mild systolic dysfunction.  There was no large VSD evident.  There was no myocardial delayed enhancement so no evidence for infiltrative disease or infarction.  LHC (7/10) showed only luminal irreg. in the coronaries and EF 35%  . Holter monitor, abnormal 7/10   Frequent PAC's and PVC's  Average HR 96  (but he had started Toprol XL).  HR range 71-130  . Hyperlipidemia 04/1998  . Hypertension 1995  . Obesity   . OSA (obstructive sleep apnea)    Not using CPAP- improved with weight loss  . Pneumonia    HX OF pna  . Sinus tachycardia ([place])    Pt says he has been told his heart rate is high since he was in high school.   . Sleep apnea    has cpap; but does not use  . VSD (ventricular septal defect)    Probable small perimembranous    Past Surgical History:  Procedure Laterality Date  . CARDIAC CATHETERIZATION  05/30/2009   Dilated cardiomyopathy.  min VSD.  Hypokin Inf  wall.  Multiple min luminal Irr.  EF 35%  . COLONOSCOPY W/ BIOPSIES  08/24/2006   Divertics, polyps x3, bx negative, repeat 2012 with mild diverticulosis in teh sigmoid colon and 1 polyp in tranverse colon, repeat due 2017  . ESOPHAGOGASTRODUODENOSCOPY  2012   1)Barrett's esoph, 2) Erosive esophagitis, 3) Mild gastritis in the antrum, 4) Duodenitis in the bulb of duodenum,  5) Small hiatal hernia  . INGUINAL HERNIA REPAIR  1985   Left  . INGUINAL HERNIA REPAIR  1999   Right  . KNEE ARTHROSCOPY  10/15/2011   Procedure: ARTHROSCOPY KNEE;  Surgeon: Ninetta Lights, MD;  Location: [facility];  Service: Orthopedics;  Laterality: Right;  right knee scope with lateral meniscectomy, removal loose foreign body, and microfracture technique  . LAMINECTOMY  1987   with discectomy  . ROTATOR CUFF REPAIR  2/11   rt  . TOTAL HIP ARTHROPLASTY  2011   Left  . TOTAL KNEE ARTHROPLASTY  06/01/2012   Procedure: TOTAL KNEE ARTHROPLASTY;  Surgeon: Ninetta Lights, MD;  Location: [facility];  Service:  Orthopedics;  Laterality: Right;     Current Outpatient Prescriptions  Medication Sig Dispense Refill  . aspirin 81 MG tablet Take 81 mg by mouth 2 (two) times daily.    . carvedilol (COREG) 25 MG tablet TAKE 1 TABLET BY MOUTH TWICE A DAY WITH A MEAL 180 tablet 3  . doxazosin (CARDURA) 1 MG tablet TAKE 1 TABLET (1 MG TOTAL) BY MOUTH AT BEDTIME. 90 tablet 3  . fish oil-omega-3 fatty acids 1000 MG capsule Take 1 g by mouth 2 (two) times daily.     . Lancets (ONETOUCH ULTRASOFT) lancets USE DAILY AS NEEDED TO CHECK SUGAR, DX 250.00 100 each 2  . losartan (COZAAR) 100 MG tablet 1/2 TABLET EVERY EVENING 45 tablet 3  . meloxicam (MOBIC) 15 MG tablet Take 15 mg by mouth daily.     . metFORMIN (GLUCOPHAGE) 500 MG tablet Take 1 tablet (500 mg total) by mouth 2 (two) times daily with a meal. 180 tablet 1  . omeprazole (PRILOSEC) 20 MG capsule Take 1 capsule (20 mg total) by mouth daily. 90 capsule 3  . ONE TOUCH ULTRA TEST test strip CHECK BLOOD SUGAR DAILY AND IF BLOOD SUGAR FLUCTUATES. 250.00 100 each 11  . PARoxetine (PAXIL) 20 MG tablet TAKE 1 TABLET (20 MG TOTAL) BY MOUTH DAILY. 90 tablet 3  . pravastatin (PRAVACHOL) 40 MG tablet Take 1 tablet (40 mg total) by mouth at bedtime. 90 tablet 3   No current facility-administered medications for this visit.     Allergies:   Review of patient's allergies indicates no known allergies.    Social History:  The patient  reports that he quit smoking about 17 years ago. His smoking use included Cigarettes. He quit after 20.00 years of use. He quit smokeless tobacco use about 9 years ago. He reports that he does not drink alcohol or use drugs.   Family History:  The patient's family history includes Alcohol abuse in his other; Alzheimer's disease in his mother; Cancer in his sister; Colon cancer in his cousin; Diabetes in his brother, father, maternal grandmother, other, sister, and sister; Heart disease in his father; Hypertension in his mother, sister, and  sister; Stroke in his father; Throat cancer in his brother and other.    ROS:  Please see the history of present illness.   Otherwise, review of systems are positive for none.   All other systems are reviewed and negative.    PHYSICAL EXAM: VS:  BP 100/64 (BP Location: Left Arm, Patient Position: Sitting, Cuff Size: Normal)   Pulse 88   Ht 5\' 10"  (1.778 m)   Wt 234 lb 8 oz (106.4 kg)   BMI 33.65 kg/m  , BMI Body mass index is 33.65 kg/m. GEN: Well nourished, well developed, in no acute distress  HEENT: normal  Neck: no JVD, or masses. Faint left carotid bruit Cardiac: RRR; no murmurs, rubs, or gallops,no edema  Respiratory:  clear to auscultation bilaterally, normal work of breathing GI: soft, nontender, nondistended, + BS MS: no deformity or atrophy  Skin: warm and dry, no rash Neuro:  Strength and sensation are intact Psych: euthymic mood, full affect   EKG:  EKG is ordered today. The ekg ordered today demonstrates normal sinus rhythm with PACs.   Recent Labs: 08/16/2015: ALT 18; BUN 19; Creatinine, Ser 0.92; Potassium 3.9; Sodium 142    Lipid Panel    Component Value Date/Time   CHOL 169 08/16/2015 0810   TRIG 128.0 08/16/2015 0810   HDL 49.20 08/16/2015 0810   CHOLHDL 3 08/16/2015 0810   VLDL 25.6 08/16/2015 0810   LDLCALC 94 08/16/2015 0810   LDLDIRECT 138.8 06/17/2011 0850      Wt Readings from Last 3 Encounters:  07/02/16 234 lb 8 oz (106.4 kg)  03/02/16 230 lb (104.3 kg)  02/26/16 236 lb (107 kg)         ASSESSMENT AND PLAN:  1.  Chronic systolic heart failure: Most recent ejection fraction was 50-55% in 2013. He appears to be euvolemic. Continue treatment with carvedilol and losartan.  2. Essential hypertension: Blood pressure is well controlled on current medications.  3. Hyperlipidemia: Continue treatment with pravastatin given that he is diabetic. Most recent LDL was 94.  4. Left carotid bruit: Carotid Doppler requested.  Disposition:   FU  with me in 1 year  Signed,  [name], MD  07/02/2016 11:08 AM    [facility]

## 2016-07-03 ENCOUNTER — Encounter: Payer: Self-pay | Admitting: Cardiovascular Disease

## 2016-07-03 NOTE — Addendum Note (Signed)
Addended by: Dede Query R on: 07/03/2016 02:51 PM   Modules accepted: Orders

## 2016-07-16 ENCOUNTER — Ambulatory Visit: Payer: Medicare Other

## 2016-07-16 DIAGNOSIS — R0989 Other specified symptoms and signs involving the circulatory and respiratory systems: Secondary | ICD-10-CM

## 2016-07-17 ENCOUNTER — Other Ambulatory Visit: Payer: Self-pay

## 2016-07-17 DIAGNOSIS — I779 Disorder of arteries and arterioles, unspecified: Secondary | ICD-10-CM

## 2016-07-17 DIAGNOSIS — I739 Peripheral vascular disease, unspecified: Principal | ICD-10-CM

## 2016-07-17 DIAGNOSIS — E785 Hyperlipidemia, unspecified: Secondary | ICD-10-CM

## 2016-07-17 MED ORDER — ATORVASTATIN CALCIUM 40 MG PO TABS: 40.0000 mg | ORAL_TABLET | Freq: Every day | ORAL | 3 refills | Status: DC

## 2016-08-07 ENCOUNTER — Other Ambulatory Visit: Payer: Self-pay | Admitting: Family Medicine

## 2016-08-07 DIAGNOSIS — Z125 Encounter for screening for malignant neoplasm of prostate: Secondary | ICD-10-CM

## 2016-08-07 DIAGNOSIS — E119 Type 2 diabetes mellitus without complications: Secondary | ICD-10-CM

## 2016-08-11 ENCOUNTER — Other Ambulatory Visit (INDEPENDENT_AMBULATORY_CARE_PROVIDER_SITE_OTHER): Payer: Medicare Other

## 2016-08-11 DIAGNOSIS — E119 Type 2 diabetes mellitus without complications: Secondary | ICD-10-CM | POA: Diagnosis not present

## 2016-08-11 DIAGNOSIS — Z125 Encounter for screening for malignant neoplasm of prostate: Secondary | ICD-10-CM

## 2016-08-11 LAB — COMPREHENSIVE METABOLIC PANEL
ALBUMIN: 4.1 g/dL (ref 3.5–5.2)
ALT: 19 U/L (ref 0–53)
AST: 18 U/L (ref 0–37)
Alkaline Phosphatase: 69 U/L (ref 39–117)
BUN: 11 mg/dL (ref 6–23)
CALCIUM: 9.2 mg/dL (ref 8.4–10.5)
CHLORIDE: 103 meq/L (ref 96–112)
CO2: 32 meq/L (ref 19–32)
CREATININE: 0.98 mg/dL (ref 0.40–1.50)
GFR: 81.68 mL/min (ref >60.00)
Glucose, Bld: 174 mg/dL — ABNORMAL HIGH (ref 70–99)
Potassium: 4.8 mEq/L (ref 3.5–5.1)
Sodium: 142 mEq/L (ref 135–145)
Total Bilirubin: 0.9 mg/dL (ref 0.2–1.2)
Total Protein: 6.7 g/dL (ref 6.0–8.3)

## 2016-08-11 LAB — LIPID PANEL
CHOL/HDL RATIO: 3
CHOLESTEROL: 131 mg/dL (ref 0–200)
HDL: 47.8 mg/dL (ref >39.00)
LDL CALC: 63 mg/dL (ref 0–99)
NonHDL: 82.74
Triglycerides: 101 mg/dL (ref 0.0–149.0)
VLDL: 20.2 mg/dL (ref 0.0–40.0)

## 2016-08-11 LAB — PSA, MEDICARE: PSA: 1.27 ng/mL (ref 0.10–4.00)

## 2016-08-11 LAB — HEMOGLOBIN A1C: HEMOGLOBIN A1C: 7.7 % — AB (ref 4.6–6.5)

## 2016-08-12 ENCOUNTER — Other Ambulatory Visit: Payer: Medicare Other

## 2016-08-19 ENCOUNTER — Other Ambulatory Visit: Payer: Medicare Other

## 2016-08-24 ENCOUNTER — Encounter: Payer: Self-pay | Admitting: Family Medicine

## 2016-08-24 ENCOUNTER — Other Ambulatory Visit: Payer: Self-pay | Admitting: *Deleted

## 2016-08-24 ENCOUNTER — Ambulatory Visit (INDEPENDENT_AMBULATORY_CARE_PROVIDER_SITE_OTHER): Payer: Medicare Other | Admitting: Family Medicine

## 2016-08-24 VITALS — BP 112/72 | HR 101 | Temp 98.4°F | Ht 70.0 in | Wt 237.8 lb

## 2016-08-24 DIAGNOSIS — F341 Dysthymic disorder: Secondary | ICD-10-CM

## 2016-08-24 DIAGNOSIS — I10 Essential (primary) hypertension: Secondary | ICD-10-CM | POA: Diagnosis not present

## 2016-08-24 DIAGNOSIS — E1165 Type 2 diabetes mellitus with hyperglycemia: Secondary | ICD-10-CM | POA: Diagnosis not present

## 2016-08-24 DIAGNOSIS — Z23 Encounter for immunization: Secondary | ICD-10-CM | POA: Diagnosis not present

## 2016-08-24 DIAGNOSIS — K2270 Barrett's esophagus without dysplasia: Secondary | ICD-10-CM | POA: Diagnosis not present

## 2016-08-24 DIAGNOSIS — IMO0001 Reserved for inherently not codable concepts without codable children: Secondary | ICD-10-CM

## 2016-08-24 DIAGNOSIS — Z0000 Encounter for general adult medical examination without abnormal findings: Secondary | ICD-10-CM

## 2016-08-24 MED ORDER — PAROXETINE HCL 20 MG PO TABS: ORAL_TABLET | ORAL | 3 refills | Status: DC

## 2016-08-24 MED ORDER — MELOXICAM 15 MG PO TABS: 15.0000 mg | ORAL_TABLET | Freq: Every day | ORAL | 3 refills | Status: DC

## 2016-08-24 MED ORDER — ATORVASTATIN CALCIUM 40 MG PO TABS: 40.0000 mg | ORAL_TABLET | Freq: Every day | ORAL | 3 refills | Status: DC

## 2016-08-24 MED ORDER — DOXAZOSIN MESYLATE 1 MG PO TABS: ORAL_TABLET | ORAL | 3 refills | Status: DC

## 2016-08-24 MED ORDER — LOSARTAN POTASSIUM 100 MG PO TABS: ORAL_TABLET | ORAL | 3 refills | Status: DC

## 2016-08-24 MED ORDER — CARVEDILOL 25 MG PO TABS: ORAL_TABLET | ORAL | 3 refills | Status: DC

## 2016-08-24 MED ORDER — METFORMIN HCL 500 MG PO TABS: 500.0000 mg | ORAL_TABLET | Freq: Two times a day (BID) | ORAL | 3 refills | Status: DC

## 2016-08-24 NOTE — Patient Instructions (Addendum)
Check with your insurance to see if they will cover the shingles shot. Keep working to cut out carbs.   Recheck A1c in about 6 months.  Take care.  Glad to see you.

## 2016-08-24 NOTE — Progress Notes (Signed)
I have personally reviewed the Medicare Annual Wellness questionnaire and have noted 1. The patient's medical and social history 2. Their use of alcohol, tobacco or illicit drugs 3. Their current medications and supplements 4. The patient's functional ability including ADL's, fall risks, home safety risks and hearing or visual             impairment. 5. Diet and physical activities 6. Evidence for depression or mood disorders  The patients weight, height, BMI have been recorded in the chart and visual acuity is per eye clinic.  I have made referrals, counseling and provided education to the patient based review of the above and I have provided the pt with a written personalized care plan for preventive services.  Provider list updated- see scanned forms.  Routine anticipatory guidance given to patient.  See health maintenance.  Flu 2017 Shingles d/w pt.   PNA 2013  Tetanus 2014  Colonoscopy done 2017 PSA wnl 2017 Advance directive- daughter designated if patient were incapacitated.  Cognitive function addressed- see scanned forms- and if abnormal then additional documentation follows.   H/o barrett's. On PPI.  No dysphagia.  No blood in stool.   Hypertension:    Using medication without problems or lightheadedness: yes Chest pain with exertion:no Edema:no Short of breath:no  Diabetes:  Using medications without difficulties: yes Hypoglycemic episodes: no Hyperglycemic episodes:no Feet problems:at baseline in L foot from prev back/hip issues, not DM2 Blood Sugars averaging: usually ~ 100-150 eye exam within last year: yes Labs d/w pt.  A1c some better, not yet at goal, he didn't want to go up on his med.  D/w pt about diet and exercise.  He cut out biscuits.    Mood is good.  Happy about grandson, born 2 months ago.    PMH and SH reviewed  Meds, vitals, and allergies reviewed.   ROS: Per HPI.  Unless specifically indicated otherwise in HPI, the patient denies:  General:  fever. Eyes: acute vision changes ENT: sore throat Cardiovascular: chest pain Respiratory: SOB GI: vomiting GU: dysuria Musculoskeletal: acute back pain Derm: acute rash Neuro: acute motor dysfunction Psych: worsening mood Endocrine: polydipsia Heme: bleeding Allergy: hayfever  GEN: nad, alert and oriented HEENT: mucous membranes moist NECK: supple w/o LA CV: rrr. PULM: ctab, no inc wob ABD: soft, +bs EXT: no edema SKIN: no acute rash  Diabetic foot exam: Normal inspection No skin breakdown No calluses  Normal DP pulses Normal sensation to light touch and monofilament Nails normal

## 2016-08-24 NOTE — Progress Notes (Signed)
Pre visit review using our clinic review tool, if applicable. No additional management support is needed unless otherwise documented below in the visit note. 

## 2016-08-25 NOTE — Assessment & Plan Note (Signed)
Continue SSRI, doing well.

## 2016-08-25 NOTE — Assessment & Plan Note (Signed)
Continue PPI, Cr okay.  D/w pt.

## 2016-08-25 NOTE — Assessment & Plan Note (Signed)
A1c is some better. Not yet at goal. Discussed about options. He didn't really want to go up on his medications. Reasonable to continue with diet and exercise. Recheck A1c in a few months. As long as his A1c continues to improve, this is reasonable.

## 2016-08-25 NOTE — Assessment & Plan Note (Signed)
Flu 2017 Shingles d/w pt.   PNA 2013  Tetanus 2014  Colonoscopy done 2017 PSA wnl 2017 Advance directive- daughter designated if patient were incapacitated.  Cognitive function addressed- see scanned forms- and if abnormal then additional documentation follows.

## 2016-08-25 NOTE — Assessment & Plan Note (Signed)
Controlled.  Continue as is.  He agrees. 

## 2016-08-28 ENCOUNTER — Other Ambulatory Visit (INDEPENDENT_AMBULATORY_CARE_PROVIDER_SITE_OTHER): Payer: Medicare Other

## 2016-08-28 DIAGNOSIS — E785 Hyperlipidemia, unspecified: Secondary | ICD-10-CM

## 2016-08-28 DIAGNOSIS — I779 Disorder of arteries and arterioles, unspecified: Secondary | ICD-10-CM | POA: Diagnosis not present

## 2016-08-28 DIAGNOSIS — I739 Peripheral vascular disease, unspecified: Principal | ICD-10-CM

## 2016-08-29 LAB — LIPID PANEL
Chol/HDL Ratio: 3.7 ratio units (ref 0.0–5.0)
Cholesterol, Total: 178 mg/dL (ref 100–199)
HDL: 48 mg/dL (ref >39)
LDL CALC: 106 mg/dL — AB (ref 0–99)
Triglycerides: 122 mg/dL (ref 0–149)
VLDL CHOLESTEROL CAL: 24 mg/dL (ref 5–40)

## 2016-08-29 LAB — HEPATIC FUNCTION PANEL
ALBUMIN: 4.1 g/dL (ref 3.6–4.8)
ALK PHOS: 68 IU/L (ref 39–117)
ALT: 21 IU/L (ref 0–44)
AST: 22 IU/L (ref 0–40)
Bilirubin Total: 0.5 mg/dL (ref 0.0–1.2)
Bilirubin, Direct: 0.14 mg/dL (ref 0.00–0.40)
TOTAL PROTEIN: 6.7 g/dL (ref 6.0–8.5)

## 2016-09-03 ENCOUNTER — Other Ambulatory Visit: Payer: Self-pay | Admitting: Family Medicine

## 2016-09-21 ENCOUNTER — Other Ambulatory Visit: Payer: Self-pay | Admitting: Family Medicine

## 2016-09-22 DIAGNOSIS — Z96642 Presence of left artificial hip joint: Secondary | ICD-10-CM | POA: Diagnosis not present

## 2016-09-22 DIAGNOSIS — M7582 Other shoulder lesions, left shoulder: Secondary | ICD-10-CM | POA: Diagnosis not present

## 2016-09-22 DIAGNOSIS — M545 Low back pain: Secondary | ICD-10-CM | POA: Diagnosis not present

## 2016-09-22 DIAGNOSIS — Z96651 Presence of right artificial knee joint: Secondary | ICD-10-CM | POA: Diagnosis not present

## 2016-10-06 ENCOUNTER — Other Ambulatory Visit: Payer: Self-pay | Admitting: Family Medicine

## 2016-10-29 ENCOUNTER — Telehealth: Payer: Self-pay | Admitting: Cardiovascular Disease

## 2016-10-29 NOTE — Telephone Encounter (Signed)
S/w pt who confirms he takes atorvastatin 40mg  qd as prescribed. He states [facility] continues to try and fill [facility]rav[facility]atin as well however, he has notified them he no longer takes pravastatin.

## 2016-10-30 ENCOUNTER — Other Ambulatory Visit: Payer: Self-pay | Admitting: Family Medicine

## 2016-11-10 DIAGNOSIS — D225 Melanocytic nevi of trunk: Secondary | ICD-10-CM | POA: Diagnosis not present

## 2016-11-10 DIAGNOSIS — D485 Neoplasm of uncertain behavior of skin: Secondary | ICD-10-CM | POA: Diagnosis not present

## 2016-11-10 DIAGNOSIS — L570 Actinic keratosis: Secondary | ICD-10-CM | POA: Diagnosis not present

## 2016-11-10 DIAGNOSIS — Z85820 Personal history of malignant melanoma of skin: Secondary | ICD-10-CM | POA: Diagnosis not present

## 2016-11-10 DIAGNOSIS — X32XXXA Exposure to sunlight, initial encounter: Secondary | ICD-10-CM | POA: Diagnosis not present

## 2016-11-10 DIAGNOSIS — Z85828 Personal history of other malignant neoplasm of skin: Secondary | ICD-10-CM | POA: Diagnosis not present

## 2016-11-10 DIAGNOSIS — D2261 Melanocytic nevi of right upper limb, including shoulder: Secondary | ICD-10-CM | POA: Diagnosis not present

## 2017-02-19 ENCOUNTER — Emergency Department
Admission: EM | Admit: 2017-02-19 | Discharge: 2017-02-19 | Disposition: A | Discharge status: Home / Self Care | Payer: Medicare Other | Attending: Emergency Medicine | Admitting: Emergency Medicine

## 2017-02-19 ENCOUNTER — Encounter: Payer: Self-pay | Admitting: Emergency Medicine

## 2017-02-19 DIAGNOSIS — Z7982 Long term (current) use of aspirin: Secondary | ICD-10-CM | POA: Diagnosis not present

## 2017-02-19 DIAGNOSIS — S6991XA Unspecified injury of right wrist, hand and finger(s), initial encounter: Secondary | ICD-10-CM | POA: Diagnosis present

## 2017-02-19 DIAGNOSIS — E119 Type 2 diabetes mellitus without complications: Secondary | ICD-10-CM | POA: Insufficient documentation

## 2017-02-19 DIAGNOSIS — I10 Essential (primary) hypertension: Secondary | ICD-10-CM | POA: Diagnosis not present

## 2017-02-19 DIAGNOSIS — W228XXA Striking against or struck by other objects, initial encounter: Secondary | ICD-10-CM | POA: Insufficient documentation

## 2017-02-19 DIAGNOSIS — Z7984 Long term (current) use of oral hypoglycemic drugs: Secondary | ICD-10-CM | POA: Insufficient documentation

## 2017-02-19 DIAGNOSIS — Y929 Unspecified place or not applicable: Secondary | ICD-10-CM | POA: Insufficient documentation

## 2017-02-19 DIAGNOSIS — Y9389 Activity, other specified: Secondary | ICD-10-CM | POA: Diagnosis not present

## 2017-02-19 DIAGNOSIS — S61541A Puncture wound with foreign body of right wrist, initial encounter: Secondary | ICD-10-CM | POA: Insufficient documentation

## 2017-02-19 DIAGNOSIS — S60851A Superficial foreign body of right wrist, initial encounter: Secondary | ICD-10-CM

## 2017-02-19 DIAGNOSIS — Y999 Unspecified external cause status: Secondary | ICD-10-CM | POA: Diagnosis not present

## 2017-02-19 DIAGNOSIS — Z87891 Personal history of nicotine dependence: Secondary | ICD-10-CM | POA: Diagnosis not present

## 2017-02-19 MED ORDER — SULFAMETHOXAZOLE-TRIMETHOPRIM 800-160 MG PO TABS: 1.0000 | ORAL_TABLET | Freq: Two times a day (BID) | ORAL | 0 refills | Status: DC

## 2017-02-19 MED ORDER — LIDOCAINE HCL (PF) 1 % IJ SOLN
INTRAMUSCULAR | Status: AC
  Filled 2017-02-19: qty 5

## 2017-02-19 NOTE — Discharge Instructions (Signed)
Keep area clean and dry. °

## 2017-02-19 NOTE — ED Provider Notes (Signed)
[facility] Emergency Department Provider Note   ____________________________________________   First MD Initiated Contact with Patient 02/19/17 1642     (approximate)  I have reviewed the triage vital signs and the nursing notes.   HISTORY  Chief Complaint Foreign Body    HPI Tim Mccullough is a 65 y.o. male patient's percent. Facial embedded in the right wrist. Instead occurred approximately 3 hours ago. Patient state bleeding is controlled with direct pressure. Patient stated pain increases with movement of the wrist and pain radiates to the right fifth finger. Patient rates his pain as a 5/10. Patient described a pain as "achy". Patient tetanus shot is less than 5 years.   Past Medical History:  Diagnosis Date  . Abnormal echocardiogram 2/11   Repeated after med mgmt of cardiomyopathy showed EF 40-45%, mildly dilated LV, mild LV hypertrophy, anterolateral and apical hypokinesis, mild diastolic dysfunction.  . Anemia    hx  . Arthritis   . Blood transfusion without reported diagnosis 09/2012  . BPH (benign prostatic hypertrophy) 2004  . Cancer ([place])    melanoma back  . Cardiomyopathy    ? tachycardia-mediated  . Depression   . Diabetes mellitus 11/2006   Type II  . Diverticulosis   . Echocardiogram abnormal 7/10   Mild LVH with a mildly dilated LV.  EF was difficult to assess given poor acoustic windows but appeared moderately decreased.  Mild MR.  Probable small perimembranous VSD, mild LAE  . Esophageal erosions   . GERD (gastroesophageal reflux disease)   . Hip pain   . History of MRI 7/10   Cardiac MRI was done to followup difficult echo.  This showed mild to moderately dilated LV, mild to moderate LAE, EF  33% with global hypokinesis, normal RV size with mild systolic dysfunction.  There was no large VSD evident.  There was no myocardial delayed enhancement so no evidence for infiltrative disease or infarction.  LHC (7/10) showed only  luminal irreg. in the coronaries and EF 35%  . Holter monitor, abnormal 7/10   Frequent PAC's and PVC's  Average HR 96  (but he had started Toprol XL).  HR range 71-130  . Hyperlipidemia 04/1998  . Hypertension 1995  . Obesity   . OSA (obstructive sleep apnea)    Not using CPAP- improved with weight loss  . Pneumonia    HX OF pna  . Sinus tachycardia    Pt says he has been told his heart rate is high since he was in high school.   . Sleep apnea    has cpap; but does not use  . VSD (ventricular septal defect)    Probable small perimembranous    Patient Active Problem List   Diagnosis Date Noted  . Hand pain, left 08/21/2015  . Inguinal hernia 08/21/2015  . OA (osteoarthritis) 02/18/2015  . Advance care planning 08/19/2014  . Medicare annual wellness visit, subsequent 07/30/2013  . Barrett's esophagus 01/17/2013  . Preop cardiovascular exam 03/15/2012  . Chronic systolic CHF (congestive heart failure) ([place]) 03/15/2012  . Shingles 02/09/2012  . Vertigo 02/09/2012  . GERD 11/24/2010  . ANEMIA, IRON DEFICIENCY 09/26/2010  . VSD 05/23/2009  . UNSPECIFIED TACHYCARDIA 04/22/2009  . OBESITY 12/17/2008  . Diabetes type 2, uncontrolled ([place]) 04/17/2007  . Hyperlipidemia associated with type 2 diabetes mellitus ([place]) 04/17/2007  . Essential hypertension 04/17/2007  . BENIGN PROSTATIC HYPERTROPHY 04/17/2007  . ANXIETY DEPRESSION 04/15/2007  . ERECTILE DYSFUNCTION 04/15/2007  . SLEEP APNEA  04/15/2007  . HX, PERSONAL, TOBACCO USE 04/15/2007    Past Surgical History:  Procedure Laterality Date  . CARDIAC CATHETERIZATION  05/30/2009   Dilated cardiomyopathy.  min VSD.  Hypokin Inf  wall.  Multiple min luminal Irr.  EF 35%  . COLONOSCOPY W/ BIOPSIES  08/24/2006   Divertics, polyps x3, bx negative, repeat 2012 with mild diverticulosis in teh sigmoid colon and 1 polyp in tranverse colon, repeat due 2017  . ESOPHAGOGASTRODUODENOSCOPY  2012   1)Barrett's esoph, 2) Erosive esophagitis, 3)  Mild gastritis in the antrum, 4) Duodenitis in the bulb of duodenum, 5) Small hiatal hernia  . INGUINAL HERNIA REPAIR  1985   Left  . INGUINAL HERNIA REPAIR  1999   Right  . KNEE ARTHROSCOPY  10/15/2011   Procedure: ARTHROSCOPY KNEE;  Surgeon: Ninetta Lights, MD;  Location: [place];  Service: Orthopedics;  Laterality: Right;  right knee scope with lateral meniscectomy, removal loose foreign body, and microfracture technique  . LAMINECTOMY  1987   with discectomy  . ROTATOR CUFF REPAIR  2/11   rt  . TOTAL HIP ARTHROPLASTY  2011   Left  . TOTAL KNEE ARTHROPLASTY  06/01/2012   Procedure: TOTAL KNEE ARTHROPLASTY;  Surgeon: Ninetta Lights, MD;  Location: [place];  Service: Orthopedics;  Laterality: Right;    Prior to Admission medications   Medication Sig Start Date End Date Taking? Authorizing Provider  aspirin 81 MG tablet Take 81 mg by mouth 2 (two) times daily.    Historical Provider, MD  atorvastatin (LIPITOR) 40 MG tablet Take 1 tablet (40 mg total) by mouth daily. 08/24/16 11/22/16  Tonia Ghent, MD  carvedilol (COREG) 25 MG tablet TAKE 1 TABLET BY MOUTH TWICE A DAY WITH A MEAL 08/24/16   Tonia Ghent, MD  carvedilol (COREG) 25 MG tablet TAKE 1 TABLET BY MOUTH TWICE A DAY WITH A MEAL 09/03/16   Tonia Ghent, MD  doxazosin (CARDURA) 1 MG tablet TAKE 1 TABLET (1 MG TOTAL) BY MOUTH AT BEDTIME. 08/24/16   Tonia Ghent, MD  fish oil-omega-3 fatty acids 1000 MG capsule Take 1 g by mouth 2 (two) times daily.     Historical Provider, MD  Lancets [facility] ULTRASOFT) lancets USE DAILY AS NEEDED TO CHECK SUGAR, DX 250.00 07/22/15   Tonia Ghent, MD  losartan (COZAAR) 100 MG tablet 1/2 TABLET EVERY EVENING 08/24/16   Tonia Ghent, MD  losartan (COZAAR) 100 MG tablet TAKE 1/2 TABLET EVERY EVENING 10/06/16   Tonia Ghent, MD  meloxicam (MOBIC) 15 MG tablet Take 1 tablet (15 mg total) by mouth daily. 08/24/16   Tonia Ghent, MD  metFORMIN (GLUCOPHAGE) 500 MG  tablet Take 1 tablet (500 mg total) by mouth 2 (two) times daily with a meal. 08/24/16   Tonia Ghent, MD  omeprazole (PRILOSEC) 20 MG capsule Take 1 capsule (20 mg total) by mouth daily. 02/26/16 08/24/16  Ladene Artist, MD  ONE TOUCH ULTRA TEST test strip CHECK BLOOD SUGAR DAILY AND IF BLOOD SUGAR FLUCTUATES. 250.00 01/02/15   Tonia Ghent, MD  PARoxetine (PAXIL) 20 MG tablet TAKE 1 TABLET (20 MG TOTAL) BY MOUTH DAILY. 08/24/16   Tonia Ghent, MD  sulfamethoxazole-trimethoprim (BACTRIM DS,SEPTRA DS) 800-160 MG tablet Take 1 tablet by mouth 2 (two) times daily. 02/19/17   Sable Feil, PA-C    Allergies Patient has no known allergies.  Family History  Problem Relation Age of Onset  .  Heart disease Father     CABG  . Stroke Father   . Diabetes Father   . Hypertension Mother   . Alzheimer's disease Mother   . Diabetes Sister   . Hypertension Sister   . Cancer Sister     uterine cancer  . Diabetes Brother   . Throat cancer Brother     Throat  . Diabetes Sister   . Hypertension Sister   . Throat cancer Other     Throat  . Diabetes Other   . Alcohol abuse Other   . Diabetes Maternal Grandmother     Insulin  . Colon cancer Cousin   . Drug abuse Neg Hx   . Prostate cancer Neg Hx     Social History Social History  Substance Use Topics  . Smoking status: Former Smoker    Years: 20.00    Types: Cigarettes    Quit date: 11/09/1998  . Smokeless tobacco: Former Systems developer    Quit date: 06/03/2007     Comment: no alcohol since nov 12  . Alcohol use No     Comment: sober as 09/2011    Review of Systems Constitutional: No fever/chills Eyes: No visual changes. ENT: No sore throat. Cardiovascular: Denies chest pain. Respiratory: Denies shortness of breath. Gastrointestinal: No abdominal pain.  No nausea, no vomiting.  No diarrhea.  No constipation. Genitourinary: Negative for dysuria. Musculoskeletal: Negative for back pain. Skin: Negative for rash. Neurological:  Negative for headaches, focal weakness or numbness. Psychiatric: Depression Endocrine:Diabetes, hyperlipidemia, and hypertension. ____________________________________________   PHYSICAL EXAM:  VITAL SIGNS: ED Triage Vitals  Enc Vitals Group     BP 02/19/17 1612 107/68     Pulse Rate 02/19/17 1612 78     Resp 02/19/17 1612 18     Temp 02/19/17 1612 98.4 F (36.9 C)     Temp Source 02/19/17 1612 Oral     SpO2 02/19/17 1612 96 %     Weight 02/19/17 1613 210 lb (95.3 kg)     Height 02/19/17 1613 5\' 10"  (1.778 m)     Head Circumference --      Peak Flow --      Pain Score 02/19/17 1616 5     Pain Loc --      Pain Edu? --      Excl. in [place]? --     Constitutional: Alert and oriented. Well appearing and in no acute distress. Eyes: Conjunctivae are normal. PERRL. EOMI. Head: Atraumatic. Nose: No congestion/rhinnorhea. Mouth/Throat: Mucous membranes are moist.  Oropharynx non-erythematous. Neck: No stridor.  No cervical spine tenderness to palpation. Hematological/Lymphatic/Immunilogical: No cervical lymphadenopathy. Cardiovascular: Normal rate, regular rhythm. Grossly normal heart sounds.  Good peripheral circulation. Respiratory: Normal respiratory effort.  No retractions. Lungs CTAB. Gastrointestinal: Soft and nontender. No distention. No abdominal bruits. No CVA tenderness. Musculoskeletal: No lower extremity tenderness nor edema.  No joint effusions. Neurologic:  Normal speech and language. No gross focal neurologic deficits are appreciated. No gait instability. Skin:  Skin is warm, dry and intact. No rash noted.Foreign body right wrist Psychiatric: Mood and affect are normal. Speech and behavior are normal.  ____________________________________________   LABS (all labs ordered are listed, but only abnormal results are displayed)  Labs Reviewed - No data to  display ____________________________________________  EKG   ____________________________________________  RADIOLOGY   ____________________________________________   PROCEDURES  Procedure(s) performed: None  Procedures  Critical Care performed: No  ____________________________________________   INITIAL IMPRESSION / ASSESSMENT AND PLAN / ED COURSE  Pertinent labs & imaging results that were available during my care of the patient were reviewed by me and considered in my medical decision making (see chart for details).  Fishhook embedded into right wrist. Area was surgical clean local block using  2 mL of 1% lidocaine without epinephrine. Distal end of fishhook was pushed through the skin and the barb was cut off and fishhook was backed out of the skin. Entrance and exit wound was copiously irrigated and pressure dressing applied.    ____________________________________________   FINAL CLINICAL IMPRESSION(S) / ED DIAGNOSES  Final diagnoses:  Penetrating foreign body of skin of right wrist, initial encounter  Patient given discharge care instructions. Patient given a prescription for Bactrim DS. Patient advised follow-up family doctor as needed.    NEW MEDICATIONS STARTED DURING THIS VISIT:  New Prescriptions   SULFAMETHOXAZOLE-TRIMETHOPRIM (BACTRIM DS,SEPTRA DS) 800-160 MG TABLET    Take 1 tablet by mouth 2 (two) times daily.     Note:  This document was prepared using Dragon voice recognition software and may include unintentional dictation errors.    Sable Feil, PA-C 02/19/17 [place], MD 02/24/17 [phone]

## 2017-02-19 NOTE — ED Triage Notes (Signed)
Pt states that he was fishing today and a fish hook got stuck in his wrist. Pt states that there were 3 hooks on it, pt cut 2 off but one is stuck in his wrist. Bleeding is controlled at this time. Pt states that he takes aspirin everyday, bruising and swelling to the area noted. Pt states that pain is increased when he moves his hand and pain radiates into the right pinky.

## 2017-02-22 ENCOUNTER — Other Ambulatory Visit: Payer: Self-pay | Admitting: Gastroenterology

## 2017-02-22 DIAGNOSIS — K2270 Barrett's esophagus without dysplasia: Secondary | ICD-10-CM

## 2017-03-23 ENCOUNTER — Other Ambulatory Visit: Payer: Self-pay | Admitting: Gastroenterology

## 2017-03-23 DIAGNOSIS — K2270 Barrett's esophagus without dysplasia: Secondary | ICD-10-CM

## 2017-04-06 ENCOUNTER — Other Ambulatory Visit: Payer: Self-pay | Admitting: Gastroenterology

## 2017-04-06 DIAGNOSIS — K2270 Barrett's esophagus without dysplasia: Secondary | ICD-10-CM

## 2017-04-15 ENCOUNTER — Other Ambulatory Visit: Payer: Self-pay | Admitting: *Deleted

## 2017-04-15 MED ORDER — PAROXETINE HCL 20 MG PO TABS: ORAL_TABLET | ORAL | 1 refills | Status: DC

## 2017-05-09 ENCOUNTER — Other Ambulatory Visit: Payer: Self-pay | Admitting: Gastroenterology

## 2017-05-09 DIAGNOSIS — K2270 Barrett's esophagus without dysplasia: Secondary | ICD-10-CM

## 2017-05-14 ENCOUNTER — Other Ambulatory Visit: Payer: Self-pay | Admitting: Gastroenterology

## 2017-05-14 DIAGNOSIS — K2270 Barrett's esophagus without dysplasia: Secondary | ICD-10-CM

## 2017-05-27 ENCOUNTER — Other Ambulatory Visit: Payer: Self-pay | Admitting: Gastroenterology

## 2017-05-27 DIAGNOSIS — K2270 Barrett's esophagus without dysplasia: Secondary | ICD-10-CM

## 2017-06-03 ENCOUNTER — Encounter: Payer: Self-pay | Admitting: Cardiovascular Disease

## 2017-06-03 ENCOUNTER — Ambulatory Visit (INDEPENDENT_AMBULATORY_CARE_PROVIDER_SITE_OTHER): Payer: Medicare Other | Admitting: Cardiovascular Disease

## 2017-06-03 VITALS — BP 98/60 | HR 82 | Ht 70.0 in | Wt 215.5 lb

## 2017-06-03 DIAGNOSIS — I739 Peripheral vascular disease, unspecified: Secondary | ICD-10-CM

## 2017-06-03 DIAGNOSIS — I10 Essential (primary) hypertension: Secondary | ICD-10-CM

## 2017-06-03 DIAGNOSIS — I779 Disorder of arteries and arterioles, unspecified: Secondary | ICD-10-CM

## 2017-06-03 DIAGNOSIS — E785 Hyperlipidemia, unspecified: Secondary | ICD-10-CM | POA: Diagnosis not present

## 2017-06-03 DIAGNOSIS — I5022 Chronic systolic (congestive) heart failure: Secondary | ICD-10-CM

## 2017-06-03 MED ORDER — LOSARTAN POTASSIUM 25 MG PO TABS: 25.0000 mg | ORAL_TABLET | Freq: Every day | ORAL | 3 refills | Status: DC

## 2017-06-03 NOTE — Progress Notes (Signed)
Cardiology Office Note   Date:  06/03/2017   ID:  Tim Mccullough, Tim Mccullough 12/19/1950, MRN [id number]  PCP:  Tonia Ghent, MD  Cardiologist:   [name], MD   Chief Complaint  Patient presents with  . other    12 month follow up. Meds reviewed by the pt. verbally. "doing well."       History of Present Illness: Tim Mccullough is a 66 y.o. male who presents for A follow-up visit regarding chronic systolic heart failure due to nonischemic cardiomyopathy. This was diagnosed in 2010. Cardiac catheterization showed minor luminal irregularities without obstructive CAD. Ejection fraction was 35%. Most recent ejection fraction was 50-55% in May of  2013 .  He has other chronic medical conditions that include diabetes, Hyperlipidemia, frequent PACs and essential hypertension.  He had carotid Doppler done in 2017 for carotid bruits which showed mild bilateral internal carotid artery disease. However, he was noted to have significant left distal common carotid stenosis. I switched him from pravastatin to atorvastatin. He has been doing well overall with no chest pain, shortness of breath or palpitations. He improved his lifestyle significantly and lost 20 pounds since last year.    Past Medical History:  Diagnosis Date  . Abnormal echocardiogram 2/11   Repeated after med mgmt of cardiomyopathy showed EF 40-45%, mildly dilated LV, mild LV hypertrophy, anterolateral and apical hypokinesis, mild diastolic dysfunction.  . Anemia    hx  . Arthritis   . Blood transfusion without reported diagnosis 09/2012  . BPH (benign prostatic hypertrophy) 2004  . Cancer ([place])    melanoma back  . Cardiomyopathy    ? tachycardia-mediated  . Depression   . Diabetes mellitus 11/2006   Type II  . Diverticulosis   . Echocardiogram abnormal 7/10   Mild LVH with a mildly dilated LV.  EF was difficult to assess given poor acoustic windows but appeared moderately decreased.  Mild MR.  Probable small  perimembranous VSD, mild LAE  . Esophageal erosions   . GERD (gastroesophageal reflux disease)   . Hip pain   . History of MRI 7/10   Cardiac MRI was done to followup difficult echo.  This showed mild to moderately dilated LV, mild to moderate LAE, EF  33% with global hypokinesis, normal RV size with mild systolic dysfunction.  There was no large VSD evident.  There was no myocardial delayed enhancement so no evidence for infiltrative disease or infarction.  LHC (7/10) showed only luminal irreg. in the coronaries and EF 35%  . Holter monitor, abnormal 7/10   Frequent PAC's and PVC's  Average HR 96  (but he had started Toprol XL).  HR range 71-130  . Hyperlipidemia 04/1998  . Hypertension 1995  . Obesity   . OSA (obstructive sleep apnea)    Not using CPAP- improved with weight loss  . Pneumonia    HX OF pna  . Sinus tachycardia    Pt says he has been told his heart rate is high since he was in high school.   . Sleep apnea    has cpap; but does not use  . VSD (ventricular septal defect)    Probable small perimembranous    Past Surgical History:  Procedure Laterality Date  . CARDIAC CATHETERIZATION  05/30/2009   Dilated cardiomyopathy.  min VSD.  Hypokin Inf  wall.  Multiple min luminal Irr.  EF 35%  . COLONOSCOPY W/ BIOPSIES  08/24/2006   Divertics, polyps x3, bx negative, repeat  2012 with mild diverticulosis in teh sigmoid colon and 1 polyp in tranverse colon, repeat due 2017  . ESOPHAGOGASTRODUODENOSCOPY  2012   1)Barrett's esoph, 2) Erosive esophagitis, 3) Mild gastritis in the antrum, 4) Duodenitis in the bulb of duodenum, 5) Small hiatal hernia  . INGUINAL HERNIA REPAIR  1985   Left  . INGUINAL HERNIA REPAIR  1999   Right  . KNEE ARTHROSCOPY  10/15/2011   Procedure: ARTHROSCOPY KNEE;  Surgeon: Ninetta Lights, MD;  Location: [place];  Service: Orthopedics;  Laterality: Right;  right knee scope with lateral meniscectomy, removal loose foreign body, and  microfracture technique  . LAMINECTOMY  1987   with discectomy  . ROTATOR CUFF REPAIR  2/11   rt  . TOTAL HIP ARTHROPLASTY  2011   Left  . TOTAL KNEE ARTHROPLASTY  06/01/2012   Procedure: TOTAL KNEE ARTHROPLASTY;  Surgeon: Ninetta Lights, MD;  Location: [place];  Service: Orthopedics;  Laterality: Right;     Current Outpatient Prescriptions  Medication Sig Dispense Refill  . aspirin 81 MG tablet Take 81 mg by mouth 2 (two) times daily.    Marland Kitchen atorvastatin (LIPITOR) 40 MG tablet Take 1 tablet (40 mg total) by mouth daily. 90 tablet 3  . carvedilol (COREG) 25 MG tablet TAKE 1 TABLET BY MOUTH TWICE A DAY WITH A MEAL 180 tablet 3  . doxazosin (CARDURA) 1 MG tablet TAKE 1 TABLET (1 MG TOTAL) BY MOUTH AT BEDTIME. 90 tablet 3  . fish oil-omega-3 fatty acids 1000 MG capsule Take 1 g by mouth 2 (two) times daily.     . Lancets (ONETOUCH ULTRASOFT) lancets USE DAILY AS NEEDED TO CHECK SUGAR, DX 250.00 100 each 2  . losartan (COZAAR) 100 MG tablet TAKE 1/2 TABLET EVERY EVENING 45 tablet 2  . meloxicam (MOBIC) 15 MG tablet Take 1 tablet (15 mg total) by mouth daily. 90 tablet 3  . metFORMIN (GLUCOPHAGE) 500 MG tablet Take 1 tablet (500 mg total) by mouth 2 (two) times daily with a meal. 180 tablet 3  . omeprazole (PRILOSEC) 20 MG capsule TAKE 1 CAPSULE (20 MG TOTAL) BY MOUTH DAILY. 30 capsule 0  . ONE TOUCH ULTRA TEST test strip CHECK BLOOD SUGAR DAILY AND IF BLOOD SUGAR FLUCTUATES. 250.00 100 each 11  . PARoxetine (PAXIL) 20 MG tablet TAKE 1 TABLET (20 MG TOTAL) BY MOUTH DAILY. 90 tablet 1  . sulfamethoxazole-trimethoprim (BACTRIM DS,SEPTRA DS) 800-160 MG tablet Take 1 tablet by mouth 2 (two) times daily. 20 tablet 0   No current facility-administered medications for this visit.     Allergies:   Patient has no known allergies.    Social History:  The patient  reports that he quit smoking about 18 years ago. His smoking use included Cigarettes. He quit after 20.00 years of use. He quit smokeless  tobacco use about 10 years ago. He reports that he does not drink alcohol or use drugs.   Family History:  The patient's family history includes Alcohol abuse in his other; Alzheimer's disease in his mother; Cancer in his sister; Colon cancer in his cousin; Diabetes in his brother, father, maternal grandmother, other, sister, and sister; Heart disease in his father; Hypertension in his mother, sister, and sister; Stroke in his father; Throat cancer in his brother and other.    ROS:  Please see the history of present illness.   Otherwise, review of systems are positive for none.   All other systems are reviewed and  negative.    PHYSICAL EXAM: VS:  BP 98/60 (BP Location: Left Arm, Patient Position: Sitting, Cuff Size: Normal)   Pulse 82   Ht 5\' 10"  (1.778 m)   Wt 215 lb 8 oz (97.8 kg)   BMI 30.92 kg/m  , BMI Body mass index is 30.92 kg/m. GEN: Well nourished, well developed, in no acute distress  HEENT: normal  Neck: no JVD, or masses. Faint left carotid bruit Cardiac: RRR; no murmurs, rubs, or gallops,no edema  Respiratory:  clear to auscultation bilaterally, normal work of breathing GI: soft, nontender, nondistended, + BS MS: no deformity or atrophy  Skin: warm and dry, no rash Neuro:  Strength and sensation are intact Psych: euthymic mood, full affect   EKG:  EKG is ordered today. The ekg ordered today demonstrates normal sinus rhythm with sinus arrhythmia and right bundle branch block.   Recent Labs: 08/11/2016: BUN 11; Creatinine, Ser 0.98; Potassium 4.8; Sodium 142 08/28/2016: ALT 21    Lipid Panel    Component Value Date/Time   CHOL 178 08/28/2016 0806   TRIG 122 08/28/2016 0806   HDL 48 08/28/2016 0806   CHOLHDL 3.7 08/28/2016 0806   CHOLHDL 3 08/11/2016 0926   VLDL 20.2 08/11/2016 0926   LDLCALC 106 (H) 08/28/2016 0806   LDLDIRECT 138.8 06/17/2011 0850      Wt Readings from Last 3 Encounters:  06/03/17 215 lb 8 oz (97.8 kg)  02/19/17 210 lb (95.3 kg)    08/24/16 237 lb 12 oz (107.8 kg)         ASSESSMENT AND PLAN:  1.  Chronic systolic heart failure: Most recent ejection fraction was 50-55% in 2013. He appears to be euvolemic. Continue treatment with carvedilol and losartan.  2. Essential hypertension: Blood pressure has been running low which could be due to his improved lifestyle and weight loss. I elected to decrease losartan to 25 mg once daily.  3. Hyperlipidemia: Continue treatment with atorvastatin. LDL while on the medication was 63 which is at target.  4. Bilateral carotid disease: Mild overall but there was significant stenosis affecting the left common carotid artery. Repeat study in September 2018.  Disposition:   FU with me in 1 year  Signed,  [name], MD  06/03/2017 8:15 AM    [place]

## 2017-06-03 NOTE — Patient Instructions (Signed)
Medication Instructions:  Your physician has recommended you make the following change in your medication:  1- DECREASE Losartan to 25 mg  (1 tablet) by mouth once a day.   Labwork: none  Testing/Procedures: Your physician has requested that you have a carotid duplex. This test is an ultrasound of the carotid arteries in your neck. It looks at blood flow through these arteries that supply the brain with blood. Allow one hour for this exam. There are no restrictions or special instructions. - IN September 2018.   Follow-Up: Your physician wants you to follow-up in: [place]. You will receive a reminder letter in the mail two months in advance. If you don't receive a letter, please call our office to schedule the follow-up appointment.   If you need a refill on your cardiac medications before your next appointment, please call your pharmacy.

## 2017-06-17 ENCOUNTER — Other Ambulatory Visit: Payer: Self-pay | Admitting: Cardiovascular Disease

## 2017-06-17 DIAGNOSIS — I739 Peripheral vascular disease, unspecified: Principal | ICD-10-CM

## 2017-06-17 DIAGNOSIS — I779 Disorder of arteries and arterioles, unspecified: Secondary | ICD-10-CM

## 2017-07-13 ENCOUNTER — Ambulatory Visit: Payer: Medicare Other

## 2017-07-13 DIAGNOSIS — I6523 Occlusion and stenosis of bilateral carotid arteries: Secondary | ICD-10-CM | POA: Diagnosis not present

## 2017-07-13 DIAGNOSIS — I739 Peripheral vascular disease, unspecified: Principal | ICD-10-CM

## 2017-07-13 DIAGNOSIS — I779 Disorder of arteries and arterioles, unspecified: Secondary | ICD-10-CM

## 2017-07-13 LAB — VAS US CAROTID
LCCAPDIAS: 25 cm/s
LCCAPSYS: 96 cm/s
LEFT ECA DIAS: -20 cm/s
LEFT VERTEBRAL DIAS: -19 cm/s
Left CCA dist dias: -25 cm/s
Left CCA dist sys: -97 cm/s
Left ICA dist dias: -22 cm/s
Left ICA dist sys: -54 cm/s
Left ICA prox dias: -18 cm/s
Left ICA prox sys: -55 cm/s
RCCADSYS: -41 cm/s
RCCAPDIAS: 16 cm/s
RIGHT ECA DIAS: -13 cm/s
RIGHT VERTEBRAL DIAS: 20 cm/s
Right CCA prox sys: 87 cm/s

## 2017-08-04 DIAGNOSIS — H40013 Open angle with borderline findings, low risk, bilateral: Secondary | ICD-10-CM | POA: Diagnosis not present

## 2017-08-04 DIAGNOSIS — H52223 Regular astigmatism, bilateral: Secondary | ICD-10-CM | POA: Diagnosis not present

## 2017-08-04 DIAGNOSIS — H524 Presbyopia: Secondary | ICD-10-CM | POA: Diagnosis not present

## 2017-08-04 DIAGNOSIS — H5203 Hypermetropia, bilateral: Secondary | ICD-10-CM | POA: Diagnosis not present

## 2017-08-04 DIAGNOSIS — H25013 Cortical age-related cataract, bilateral: Secondary | ICD-10-CM | POA: Diagnosis not present

## 2017-08-04 DIAGNOSIS — E119 Type 2 diabetes mellitus without complications: Secondary | ICD-10-CM | POA: Diagnosis not present

## 2017-08-04 LAB — HM DIABETES EYE EXAM

## 2017-08-17 ENCOUNTER — Other Ambulatory Visit: Payer: Self-pay | Admitting: Family Medicine

## 2017-08-17 ENCOUNTER — Encounter: Payer: Self-pay | Admitting: Family Medicine

## 2017-08-17 DIAGNOSIS — E119 Type 2 diabetes mellitus without complications: Secondary | ICD-10-CM

## 2017-08-17 NOTE — Telephone Encounter (Signed)
Electronic refill request. Meloxicam Last office visit:   08/24/16 I mistakenly sent in this RF along with a request for Metformin.  I phoned the pharmacy to cancel the prescription, sent in error.   Last Filled:   ? Please advise.

## 2017-08-18 MED ORDER — MELOXICAM 15 MG PO TABS: 15.0000 mg | ORAL_TABLET | Freq: Every day | ORAL | 3 refills | Status: DC

## 2017-08-18 NOTE — Telephone Encounter (Signed)
Tried to call patient and voicemail has not yet been set up. Will have to try and call again later.

## 2017-08-18 NOTE — Telephone Encounter (Signed)
Sent.  Is schedule for f/u but needs labs ahead of time.  Ordered.  Thanks.

## 2017-08-19 NOTE — Telephone Encounter (Signed)
Appointment scheduled.

## 2017-08-20 ENCOUNTER — Other Ambulatory Visit (INDEPENDENT_AMBULATORY_CARE_PROVIDER_SITE_OTHER): Payer: Medicare Other

## 2017-08-20 DIAGNOSIS — E119 Type 2 diabetes mellitus without complications: Secondary | ICD-10-CM

## 2017-08-20 LAB — COMPREHENSIVE METABOLIC PANEL
ALT: 14 U/L (ref 0–53)
AST: 14 U/L (ref 0–37)
Albumin: 4.1 g/dL (ref 3.5–5.2)
Alkaline Phosphatase: 53 U/L (ref 39–117)
BILIRUBIN TOTAL: 0.8 mg/dL (ref 0.2–1.2)
BUN: 21 mg/dL (ref 6–23)
CHLORIDE: 104 meq/L (ref 96–112)
CO2: 30 meq/L (ref 19–32)
CREATININE: 0.95 mg/dL (ref 0.40–1.50)
Calcium: 8.5 mg/dL (ref 8.4–10.5)
GFR: 84.4 mL/min (ref >60.00)
GLUCOSE: 180 mg/dL — AB (ref 70–99)
Potassium: 4.2 mEq/L (ref 3.5–5.1)
Sodium: 140 mEq/L (ref 135–145)
Total Protein: 6.6 g/dL (ref 6.0–8.3)

## 2017-08-20 LAB — HEMOGLOBIN A1C: Hgb A1c MFr Bld: 7.6 % — ABNORMAL HIGH (ref 4.6–6.5)

## 2017-08-20 LAB — LIPID PANEL
CHOL/HDL RATIO: 3
Cholesterol: 137 mg/dL (ref 0–200)
HDL: 48.8 mg/dL (ref >39.00)
LDL CALC: 71 mg/dL (ref 0–99)
NONHDL: 88.55
Triglycerides: 90 mg/dL (ref 0.0–149.0)
VLDL: 18 mg/dL (ref 0.0–40.0)

## 2017-08-23 ENCOUNTER — Encounter: Payer: Self-pay | Admitting: Family Medicine

## 2017-08-23 ENCOUNTER — Ambulatory Visit (INDEPENDENT_AMBULATORY_CARE_PROVIDER_SITE_OTHER): Payer: Medicare Other | Admitting: Family Medicine

## 2017-08-23 VITALS — BP 98/60 | HR 64 | Temp 97.9°F | Wt 224.2 lb

## 2017-08-23 DIAGNOSIS — E1165 Type 2 diabetes mellitus with hyperglycemia: Secondary | ICD-10-CM

## 2017-08-23 DIAGNOSIS — Z23 Encounter for immunization: Secondary | ICD-10-CM | POA: Diagnosis not present

## 2017-08-23 DIAGNOSIS — K439 Ventral hernia without obstruction or gangrene: Secondary | ICD-10-CM

## 2017-08-23 NOTE — Progress Notes (Signed)
Possible hernia.  Noted for years, with prior PCP.  occ discomfort.  Not sore all the time.  Sometime noted with bending over.  No FCNAVD.  No blood in stool. Not getting bigger but he can occ feel a bulge- but he can't see it.    Diabetes:  Using medications without difficulties:yes Hypoglycemic episodes:no Hyperglycemic episodes:no Feet problems:no Blood Sugars averaging: usually ~150.   eye exam within last year: yes  He isn't lightheaded.  BP at baseline.    PMH and SH reviewed  Meds, vitals, and allergies reviewed.   ROS: Per HPI unless specifically indicated in ROS section   GEN: nad, alert and oriented HEENT: mucous membranes moist NECK: supple w/o LA CV: rrr. PULM: ctab, no inc wob ABD: soft, +bs, but small nontender bulge in the LLQ noted EXT: no edema SKIN: no acute rash  Diabetic foot exam: Normal inspection No skin breakdown No calluses  Normal DP pulses Normal sensation to light touch and monofilament Nails normal

## 2017-08-23 NOTE — Patient Instructions (Signed)
Likely a hernia but if not more bothersome then you don't have to have surgery.  If severe pain, then go to the ER.  If gradually worse, then let me know and we'll set you up with the general surgeons. Update me as needed.  Take care.  Glad to see you.

## 2017-08-24 NOTE — Assessment & Plan Note (Signed)
Long-standing. Not worse recently. Not really bothersome. Discussed with him about anatomy and options. If any emergent symptoms then he will go to the emergency room. Otherwise observe for now. Routine cautions given. If gradually worsening he can let me know when we can refer him to general surgery. He agrees.

## 2017-08-24 NOTE — Assessment & Plan Note (Signed)
Not controlled, but not severe elevation of A1c either. Discussed with patient about options. Reasonable to continue work on diet and exercise. Likely not worth changing his medications at this point. I want to avoid hypoglycemia. He agrees. Recheck as planned. See after visit summary.

## 2017-10-09 ENCOUNTER — Other Ambulatory Visit: Payer: Self-pay | Admitting: Family Medicine

## 2017-10-22 ENCOUNTER — Other Ambulatory Visit: Payer: Self-pay | Admitting: Family Medicine

## 2017-10-26 ENCOUNTER — Telehealth: Payer: Self-pay | Admitting: Family Medicine

## 2017-10-26 MED ORDER — CYCLOBENZAPRINE HCL 10 MG PO TABS: 5.0000 mg | ORAL_TABLET | Freq: Two times a day (BID) | ORAL | 0 refills | Status: DC | PRN

## 2017-10-26 MED ORDER — TRAMADOL HCL 50 MG PO TABS: 50.0000 mg | ORAL_TABLET | Freq: Three times a day (TID) | ORAL | 0 refills | Status: DC | PRN

## 2017-10-26 NOTE — Telephone Encounter (Signed)
Copied from [place] [id number]. Topic: Quick Communication - See Telephone Encounter >> Oct 26, 2017  3:47 PM Corie Chiquito, [place] wrote: CRM for notification. See Telephone encounter for: Patient called again to see if Dr.Duncan could call him in something for his back pain. Patient does have an appointment tomorrow with Dr.Copland @9 :45 am. If someone could please give him a call back about this at [phone]  10/26/17.

## 2017-10-26 NOTE — Telephone Encounter (Signed)
Pt calling asking if something could be called in for back pain. Pt has an appt on 12/19 to be seen for complaints of back pain.

## 2017-10-26 NOTE — Telephone Encounter (Signed)
Copied from [place] [id number]. Topic: Quick Communication - See Telephone Encounter >> Oct 26, 2017  8:39 AM Bea Graff, [place] wrote: CRM for notification. See Telephone encounter for: Pt was up all night with back pain, wants to see if he can get a referral for his back. May be a bulging disc per pt. He would like to see if something can be called in for the pain. Scheduled with Dr. Lorelei Pont tomorrow at 9:45am. [facility]ses [facility] [address].   10/26/17.

## 2017-10-26 NOTE — Telephone Encounter (Signed)
Medication phoned to pharmacy. Patient advised.  

## 2017-10-26 NOTE — Telephone Encounter (Signed)
I wouldn't use prednisone at this point given his h/o DM2.  I would try tramadol for pain and use flexeril if needed for spasms.   Please call in the tramadol.  [facility].  I sent the flexeril.   I apologize for the delay and I hope he feels better soon.  I thank Dr. Lorelei Pont for seeing patient tomorrow.

## 2017-10-27 ENCOUNTER — Encounter: Payer: Self-pay | Admitting: Family Medicine

## 2017-10-27 ENCOUNTER — Ambulatory Visit (INDEPENDENT_AMBULATORY_CARE_PROVIDER_SITE_OTHER): Payer: Medicare Other | Admitting: Family Medicine

## 2017-10-27 ENCOUNTER — Other Ambulatory Visit: Payer: Self-pay

## 2017-10-27 ENCOUNTER — Ambulatory Visit (INDEPENDENT_AMBULATORY_CARE_PROVIDER_SITE_OTHER)
Admission: RE | Admit: 2017-10-27 | Discharge: 2017-10-27 | Disposition: A | Discharge status: Home / Self Care | Payer: Medicare Other | Source: Ambulatory Visit | Attending: Family Medicine | Admitting: Family Medicine

## 2017-10-27 VITALS — BP 90/60 | HR 49 | Temp 97.9°F | Ht 70.0 in | Wt 233.5 lb

## 2017-10-27 DIAGNOSIS — M545 Low back pain, unspecified: Secondary | ICD-10-CM

## 2017-10-27 MED ORDER — TRAMADOL HCL 50 MG PO TABS: 50.0000 mg | ORAL_TABLET | Freq: Four times a day (QID) | ORAL | 1 refills | Status: DC | PRN

## 2017-10-27 MED ORDER — PREDNISONE 20 MG PO TABS: ORAL_TABLET | ORAL | 0 refills | Status: DC

## 2017-10-27 NOTE — Progress Notes (Signed)
Dr. Karleen Hampshire T. Daleisa Halperin, MD, CAQ [facility] [address] [place], [address] Phone: [phone] Fax: [phone]  10/27/2017  Patient: Tim Mccullough, Tim Mccullough, Tim Mccullough, 66 y.o.  Primary Physician:  Joaquim Nam, MD   Chief Complaint  Patient presents with  . Back Pain   Subjective:   Tim Mccullough is a 66 y.o. very pleasant male patient who presents with the following: Back Pain  ongoing for approximately: 3 d The patient has had back pain before. The back pain is localized into the lumbar spine area. They also describe no radiculopathy.  H/o back surgery from [facility].  Taking flexeril now  Thursday, was bowling and started to get some back pain. Monday picking him up was an effort. R posterior buttocks deep pain. Monday could hardly walk and leaning forward he could hardly walk.   No numbness or tingling. No bowel or bladder incontinence. No focal weakness. Prior interventions: prior spine surgery distantly Physical therapy: No Chiropractic manipulations: No Acupuncture: No Osteopathic manipulation: No Heat or cold: Minimal effect  Past Medical History, Surgical History, Family History, Medications, Allergies have been reviewed and updated if relevant.  Patient Active Problem List   Diagnosis Date Noted  . Hand pain, left 08/21/2015  . Abdominal wall hernia 08/21/2015  . OA (osteoarthritis) 02/18/2015  . Advance care planning 08/19/2014  . Medicare annual wellness visit, subsequent 07/30/2013  . Barrett's esophagus 01/17/2013  . Preop cardiovascular exam 03/15/2012  . Chronic systolic CHF (congestive heart failure) (HCC) 03/15/2012  . Shingles 02/09/2012  . Vertigo 02/09/2012  . GERD 11/24/2010  . ANEMIA, IRON DEFICIENCY 09/26/2010  . VSD 05/23/2009  . UNSPECIFIED TACHYCARDIA 04/22/2009  . OBESITY 12/17/2008  . Diabetes type 2, uncontrolled (HCC) 04/17/2007  . Hyperlipidemia associated  with type 2 diabetes mellitus (HCC) 04/17/2007  . Essential hypertension 04/17/2007  . BENIGN PROSTATIC HYPERTROPHY 04/17/2007  . ANXIETY DEPRESSION 04/15/2007  . ERECTILE DYSFUNCTION 04/15/2007  . SLEEP APNEA 04/15/2007  . HX, PERSONAL, TOBACCO USE 04/15/2007    Past Medical History:  Diagnosis Date  . Abnormal echocardiogram 2/11   Repeated after med mgmt of cardiomyopathy showed EF 40-45%, mildly dilated LV, mild LV hypertrophy, anterolateral and apical hypokinesis, mild diastolic dysfunction.  . Anemia    hx  . Arthritis   . Blood transfusion without reported diagnosis 09/2012  . BPH (benign prostatic hypertrophy) 2004  . Cancer (HCC)    melanoma back  . Cardiomyopathy    ? tachycardia-mediated  . Depression   . Diabetes mellitus 11/2006   Type II  . Diverticulosis   . Echocardiogram abnormal 7/10   Mild LVH with a mildly dilated LV.  EF was difficult to assess given poor acoustic windows but appeared moderately decreased.  Mild MR.  Probable small perimembranous VSD, mild LAE  . Esophageal erosions   . GERD (gastroesophageal reflux disease)   . Hip pain   . History of MRI 7/10   Cardiac MRI was done to followup difficult echo.  This showed mild to moderately dilated LV, mild to moderate LAE, EF  33% with global hypokinesis, normal RV size with mild systolic dysfunction.  There was no large VSD evident.  There was no myocardial delayed enhancement so no evidence for infiltrative disease or infarction.  LHC (7/10) showed only luminal irreg. in the coronaries and EF 35%  . Holter monitor, abnormal 7/10   Frequent PAC's and PVC's  Average HR 96  (  but he had started Toprol XL).  HR range 71-130  . Hyperlipidemia 04/1998  . Hypertension 1995  . Obesity   . OSA (obstructive sleep apnea)    Not using CPAP- improved with weight loss  . Pneumonia    HX OF pna  . Sinus tachycardia    Pt says he has been told his heart rate is high since he was in high school.   . Sleep apnea     has cpap; but does not use  . VSD (ventricular septal defect)    Probable small perimembranous    Past Surgical History:  Procedure Laterality Date  . CARDIAC CATHETERIZATION  05/30/2009   Dilated cardiomyopathy.  min VSD.  Hypokin Inf  wall.  Multiple min luminal Irr.  EF 35%  . COLONOSCOPY W/ BIOPSIES  08/24/2006   Divertics, polyps x3, bx negative, repeat 2012 with mild diverticulosis in teh sigmoid colon and 1 polyp in tranverse colon, repeat due 2017  . ESOPHAGOGASTRODUODENOSCOPY  2012   1)Barrett's esoph, 2) Erosive esophagitis, 3) Mild gastritis in the antrum, 4) Duodenitis in the bulb of duodenum, 5) Small hiatal hernia  . INGUINAL HERNIA REPAIR  1985   Left  . INGUINAL HERNIA REPAIR  1999   Right  . KNEE ARTHROSCOPY  10/15/2011   Procedure: ARTHROSCOPY KNEE;  Surgeon: Loreta Ave, MD;  Location: [facility];  Service: Orthopedics;  Laterality: Right;  right knee scope with lateral meniscectomy, removal loose foreign body, and microfracture technique  . LAMINECTOMY  1987   with discectomy  . ROTATOR CUFF REPAIR  2/11   rt  . TOTAL HIP ARTHROPLASTY  2011   Left  . TOTAL KNEE ARTHROPLASTY  06/01/2012   Procedure: TOTAL KNEE ARTHROPLASTY;  Surgeon: Loreta Ave, MD;  Location[facility];  Service: Orthopedics;  Laterality: Right;    Social History   Socioeconomic History  . Marital status: Single    Spouse name: Not on file  . Number of children: Not on file  . Years of education: Not on file  . Highest education level: Not on file  Social Needs  . Financial resource strain: Not on file  . Food insecurity - worry: Not on file  . Food insecurity - inability: Not on file  . Transportation needs - medical: Not on file  . Transportation needs - non-medical: Not on file  Occupational History  . Occupation: Health visitor: [employer]  . 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 Smoking status: Former Smoker    Years: 20.00     Types: Cigarettes    Last attempt to quit: 11/09/1998    Years since quitting: 18.9  . Smokeless tobacco: Former Neurosurgeon    Quit date: 06/03/2007  . Tobacco comment: no alcohol since nov 12  Substance and Sexual Activity  . Alcohol use: No    Alcohol/week: 0.0 oz    Comment: sober as 09/2011  . Drug use: No  . Sexual activity: Not Currently  Other Topics Concern  . Not on file  Social History Narrative   Lives in [place], his daughter and her kids moved in with him (2015)   Divorced    Family History  Problem Relation Age of Onset  . Heart disease Father        CABG  . Stroke Father   . Diabetes Father   . Hypertension Mother   . Alzheimer's disease Mother   . Diabetes Sister   .  Hypertension Sister   . Cancer Sister        uterine cancer  . Diabetes Brother   . Throat cancer Brother        Throat  . Diabetes Sister   . Hypertension Sister   . Throat cancer Other        Throat  . Diabetes Other   . Alcohol abuse Other   . Diabetes Maternal Grandmother        Insulin  . Colon cancer Cousin   . Drug abuse Neg Hx   . Prostate cancer Neg Hx     No Known Allergies  Medication list reviewed and updated in full in [facility].  GEN: No fevers, chills. Nontoxic. Primarily MSK c/o today. MSK: Detailed in the HPI GI: tolerating PO intake without difficulty Neuro: As above  Otherwise the pertinent positives of the ROS are noted above.    Objective:   Blood pressure 90/60, pulse (!) 49, temperature 97.9 F (36.6 C), temperature source Oral, height 5\' 10"  (1.778 m), weight 233 lb 8 oz (105.9 kg).  Gen: Well-developed,well-nourished,in no acute distress; alert,appropriate and cooperative throughout examination HEENT: Normocephalic and atraumatic without obvious abnormalities.  Ears, externally no deformities Pulm: Breathing comfortably in no respiratory distress Range of motion at  the waist: Flexion, rotation and lateral bending: No significant impairment with flexion.   Extension causes significant pain.  Lateral bending and rotation are normal.  No echymosis or edema Rises to examination table with no difficulty Gait: minimally antalgic  Inspection/Deformity: No abnormality Paraspinus T: Moderate tenderness from L3 through L5.  B Ankle Dorsiflexion (L5,4): 5/5 B Great Toe Dorsiflexion (L5,4): 5/5 Heel Walk (L5): WNL Toe Walk (S1): WNL Rise/Squat (L4): WNL, mild pain  SENSORY B Medial Foot (L4): WNL B Dorsum (L5): WNL B Lateral (S1): WNL Light Touch: WNL Pinprick: WNL Patient does at baseline have some minor decreased sensation on the left foot as well as the lower extremity secondary to prior operative intervention of the spine.  REFLEXES Knee (L4): 2+ Ankle (S1): 2+  B SLR, seated: neg B SLR, supine: neg B FABER: neg B Reverse FABER: neg B Greater Troch: NT B Log Roll: neg B Stork: NT B Sciatic Notch: NT  Radiology: Dg Lumbar Spine Complete  Result Date: 10/27/2017 CLINICAL DATA:  Acute low back pain. EXAM: LUMBAR SPINE - COMPLETE 4+ VIEW COMPARISON:  None. FINDINGS: There is no evidence of lumbar spine fracture. Alignment is normal. There is narrowing of the L4-5 and L5-S1 disc spaces with vacuum phenomena. Degenerative facet arthritis primarily at L3-4. IMPRESSION: No acute abnormalities. Degenerative disc disease at L4-5 and L5-S1. Degenerative facet arthritis at L3-4. Electronically Signed   By: Francene Boyers M.D.   On: 10/27/2017 14:53    Assessment and Plan:   Acute bilateral low back pain without sciatica - Plan: DG Lumbar Spine Complete  Diffuse degenerative changes in a patient with prior spine surgery.  Plain films are reassuring, but do show significant degenerative disease.  We will start with some oral prednisone, pain medicine, muscle relaxants, and heat with some gentle stretching.  Start with medications, core rehab, and progress from there following low back pain algorithm. No red flags are present.  Follow-up:  3 weeks if needed  Meds ordered this encounter  Medications  . predniSONE (DELTASONE) 20 MG tablet    Sig: 2 tabs po for 5 days, then 1 tab po for 5 days    Dispense:  15 tablet  Refill:  0  . traMADol (ULTRAM) 50 MG tablet    Sig: Take 1 tablet (50 mg total) by mouth every 6 (six) hours as needed.    Dispense:  40 tablet    Refill:  1   Medications Discontinued During This Encounter  Medication Reason  . traMADol (ULTRAM) 50 MG tablet Reorder   Orders Placed This Encounter  Procedures  . DG Lumbar Spine Complete    Signed,  Aster Screws T. Justeen Hehr, MD   Allergies as of 10/27/2017   No Known Allergies     Medication List        Accurate as of 10/27/17  5:32 PM. Always use your most recent med list.          aspirin 81 MG tablet Take 81 mg by mouth 2 (two) times daily.   atorvastatin 40 MG tablet Commonly known as:  LIPITOR Take 1 tablet (40 mg total) by mouth daily.   carvedilol 25 MG tablet Commonly known as:  COREG TAKE 1 TABLET BY MOUTH TWICE A DAY WITH A MEAL   cyclobenzaprine 10 MG tablet Commonly known as:  FLEXERIL Take 0.5-1 tablets (5-10 mg total) by mouth 2 (two) times daily as needed for muscle spasms.   doxazosin 1 MG tablet Commonly known as:  CARDURA TAKE 1 TABLET (1 MG TOTAL) BY MOUTH AT BEDTIME.   fish oil-omega-3 fatty acids 1000 MG capsule Take 1 g by mouth 2 (two) times daily.   losartan 25 MG tablet Commonly known as:  COZAAR Take 1 tablet (25 mg total) by mouth daily.   meloxicam 15 MG tablet Commonly known as:  MOBIC Take 1 tablet (15 mg total) by mouth daily.   metFORMIN 500 MG tablet Commonly known as:  GLUCOPHAGE TAKE 1 TABLET (500 MG TOTAL) BY MOUTH 2 (TWO) TIMES DAILY WITH A MEAL.   omeprazole 20 MG capsule Commonly known as:  PRILOSEC TAKE 1 CAPSULE (20 MG TOTAL) BY MOUTH DAILY.   ONE TOUCH ULTRA TEST test strip Generic drug:  glucose blood CHECK BLOOD SUGAR DAILY AND IF BLOOD SUGAR FLUCTUATES. 250.00   onetouch  ultrasoft lancets USE DAILY AS NEEDED TO CHECK SUGAR, DX 250.00   PARoxetine 20 MG tablet Commonly known as:  PAXIL TAKE 1 TABLET (20 MG TOTAL) BY MOUTH DAILY.   predniSONE 20 MG tablet Commonly known as:  DELTASONE 2 tabs po for 5 days, then 1 tab po for 5 days   traMADol 50 MG tablet Commonly known as:  ULTRAM Take 1 tablet (50 mg total) by mouth every 6 (six) hours as needed.

## 2017-11-10 ENCOUNTER — Other Ambulatory Visit: Payer: Self-pay

## 2017-11-10 ENCOUNTER — Encounter: Payer: Self-pay | Admitting: Family Medicine

## 2017-11-10 ENCOUNTER — Ambulatory Visit (INDEPENDENT_AMBULATORY_CARE_PROVIDER_SITE_OTHER): Payer: Medicare Other | Admitting: Family Medicine

## 2017-11-10 VITALS — BP 100/60 | HR 52 | Temp 97.8°F | Ht 70.0 in | Wt 230.8 lb

## 2017-11-10 DIAGNOSIS — Z9889 Other specified postprocedural states: Secondary | ICD-10-CM

## 2017-11-10 DIAGNOSIS — M545 Low back pain, unspecified: Secondary | ICD-10-CM

## 2017-11-10 DIAGNOSIS — R29898 Other symptoms and signs involving the musculoskeletal system: Secondary | ICD-10-CM

## 2017-11-10 DIAGNOSIS — M5441 Lumbago with sciatica, right side: Secondary | ICD-10-CM | POA: Diagnosis not present

## 2017-11-10 LAB — BASIC METABOLIC PANEL
BUN: 20 mg/dL (ref 6–23)
CO2: 29 mEq/L (ref 19–32)
Calcium: 9.1 mg/dL (ref 8.4–10.5)
Chloride: 98 mEq/L (ref 96–112)
Creatinine, Ser: 1.19 mg/dL (ref 0.40–1.50)
GFR: 65.03 mL/min (ref >60.00)
GLUCOSE: 401 mg/dL — AB (ref 70–99)
POTASSIUM: 4.7 meq/L (ref 3.5–5.1)
SODIUM: 137 meq/L (ref 135–145)

## 2017-11-10 MED ORDER — CYCLOBENZAPRINE HCL 10 MG PO TABS: 5.0000 mg | ORAL_TABLET | Freq: Two times a day (BID) | ORAL | 1 refills | Status: DC | PRN

## 2017-11-10 NOTE — Progress Notes (Signed)
Dr. Karleen Hampshire T. Lebron Nauert, MD, CAQ [facility] [address] [place], [address] Phone: [phone] Fax: [phone]  11/10/2017  Patient: Tim Mccullough, MRN: [id number], DOB: 11/11/1950, 67 y.o.  Primary Physician:  Joaquim Nam, MD   Chief Complaint  Patient presents with  . Follow-up    Back pain-Seen on 10/27/17   Subjective:   Tim Mccullough is a 67 y.o. very pleasant male patient who presents with the following:  Not any better compared to before.  Taking tramadol every 4 hours.  Walked to [employer] the other day and his R knee gave out on his. Did not fall but almost.  Never any better at all.  He is globally not doing well, and he has some weakness in his leg, and also has pain down his right leg.  He did not feel any improvement, despite taking an extended course of some prednisone.  Thinks that the Flexeril did help a little bit until that ran out.  He thinks that he is having more pain now compared to even prior fractures and other surgeries that he has had.  Weak R hip  10/27/2017 Last OV with Hannah Beat, MD  ongoing for approximately: 3 d The patient has had back pain before. The back pain is localized into the lumbar spine area. They also describe no radiculopathy.  H/o back surgery from [facility].  Taking flexeril now  Thursday, was bowling and started to get some back pain. Monday picking him up was an effort. R posterior buttocks deep pain. Monday could hardly walk and leaning forward he could hardly walk.   No numbness or tingling. No bowel or bladder incontinence. No focal weakness. Prior interventions: prior spine surgery distantly Physical therapy: No Chiropractic manipulations: No Acupuncture: No Osteopathic manipulation: No Heat or cold: Minimal effect  Past Medical History, Surgical History, Social History, Family History, Problem List, Medications, and Allergies have been reviewed and  updated if relevant.  Patient Active Problem List   Diagnosis Date Noted  . Hand pain, left 08/21/2015  . Abdominal wall hernia 08/21/2015  . OA (osteoarthritis) 02/18/2015  . Advance care planning 08/19/2014  . Medicare annual wellness visit, subsequent 07/30/2013  . Barrett's esophagus 01/17/2013  . Preop cardiovascular exam 03/15/2012  . Chronic systolic CHF (congestive heart failure) (HCC) 03/15/2012  . Shingles 02/09/2012  . Vertigo 02/09/2012  . GERD 11/24/2010  . ANEMIA, IRON DEFICIENCY 09/26/2010  . VSD 05/23/2009  . UNSPECIFIED TACHYCARDIA 04/22/2009  . OBESITY 12/17/2008  . Diabetes type 2, uncontrolled (HCC) 04/17/2007  . Hyperlipidemia associated with type 2 diabetes mellitus (HCC) 04/17/2007  . Essential hypertension 04/17/2007  . BENIGN PROSTATIC HYPERTROPHY 04/17/2007  . ANXIETY DEPRESSION 04/15/2007  . ERECTILE DYSFUNCTION 04/15/2007  . SLEEP APNEA 04/15/2007  . HX, PERSONAL, TOBACCO USE 04/15/2007    Past Medical History:  Diagnosis Date  . Abnormal echocardiogram 2/11   Repeated after med mgmt of cardiomyopathy showed EF 40-45%, mildly dilated LV, mild LV hypertrophy, anterolateral and apical hypokinesis, mild diastolic dysfunction.  . Anemia    hx  . Arthritis   . Blood transfusion without reported diagnosis 09/2012  . BPH (benign prostatic hypertrophy) 2004  . Cancer (HCC)    melanoma back  . Cardiomyopathy    ? tachycardia-mediated  . Depression   . Diabetes mellitus 11/2006   Type II  . Diverticulosis   . Echocardiogram abnormal 7/10   Mild LVH with a mildly dilated  LV.  EF was difficult to assess given poor acoustic windows but appeared moderately decreased.  Mild MR.  Probable small perimembranous VSD, mild LAE  . Esophageal erosions   . GERD (gastroesophageal reflux disease)   . Hip pain   . History of MRI 7/10   Cardiac MRI was done to followup difficult echo.  This showed mild to moderately dilated LV, mild to moderate LAE, EF  33% with  global hypokinesis, normal RV size with mild systolic dysfunction.  There was no large VSD evident.  There was no myocardial delayed enhancement so no evidence for infiltrative disease or infarction.  LHC (7/10) showed only luminal irreg. in the coronaries and EF 35%  . Holter monitor, abnormal 7/10   Frequent PAC's and PVC's  Average HR 96  (but he had started Toprol XL).  HR range 71-130  . Hyperlipidemia 04/1998  . Hypertension 1995  . Obesity   . OSA (obstructive sleep apnea)    Not using CPAP- improved with weight loss  . Pneumonia    HX OF pna  . Sinus tachycardia    Pt says he has been told his heart rate is high since he was in high school.   . Sleep apnea    has cpap; but does not use  . VSD (ventricular septal defect)    Probable small perimembranous    Past Surgical History:  Procedure Laterality Date  . CARDIAC CATHETERIZATION  05/30/2009   Dilated cardiomyopathy.  min VSD.  Hypokin Inf  wall.  Multiple min luminal Irr.  EF 35%  . COLONOSCOPY W/ BIOPSIES  08/24/2006   Divertics, polyps x3, bx negative, repeat 2012 with mild diverticulosis in teh sigmoid colon and 1 polyp in tranverse colon, repeat due 2017  . ESOPHAGOGASTRODUODENOSCOPY  2012   1)Barrett's esoph, 2) Erosive esophagitis, 3) Mild gastritis in the antrum, 4) Duodenitis in the bulb of duodenum, 5) Small hiatal hernia  . INGUINAL HERNIA REPAIR  1985   Left  . INGUINAL HERNIA REPAIR  1999   Right  . KNEE ARTHROSCOPY  10/15/2011   Procedure: ARTHROSCOPY KNEE;  Surgeon: Loreta Ave, MD;  Location: [facility];  Service: Orthopedics;  Laterality: Right;  right knee scope with lateral meniscectomy, removal loose foreign body, and microfracture technique  . LAMINECTOMY  1987   with discectomy  . ROTATOR CUFF REPAIR  2/11   rt  . TOTAL HIP ARTHROPLASTY  2011   Left  . TOTAL KNEE ARTHROPLASTY  06/01/2012   Procedure: TOTAL KNEE ARTHROPLASTY;  Surgeon: Loreta Ave, MD;  Location: [facility] OR;   Service: Orthopedics;  Laterality: Right;    Social History   Socioeconomic History  . Marital status: Single    Spouse name: Not on file  . Number of children: Not on file  . Years of education: Not on file  . Highest education level: Not on file  Social Needs  . Financial resource strain: Not on file  . Food insecurity - worry: Not on file  . Food insecurity - inability: Not on file  . Transportation needs - medical: Not on file  . Transportation needs - non-medical: Not on file  Occupational History  . Occupation: Health visitor: [employer]  . 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 Smoking status: Former Smoker    Years: 20.00    Types: Cigarettes    Last attempt to quit: 11/09/1998    Years since quitting: 19.0  .  Smokeless tobacco: Former Neurosurgeon    Quit date: 06/03/2007  . Tobacco comment: no alcohol since nov 12  Substance and Sexual Activity  . Alcohol use: No    Alcohol/week: 0.0 oz    Comment: sober as 09/2011  . Drug use: No  . Sexual activity: Not Currently  Other Topics Concern  . Not on file  Social History Narrative   Lives in [place], his daughter and her kids moved in with him (2015)   Divorced    Family History  Problem Relation Age of Onset  . Heart disease Father        CABG  . Stroke Father   . Diabetes Father   . Hypertension Mother   . Alzheimer's disease Mother   . Diabetes Sister   . Hypertension Sister   . Cancer Sister        uterine cancer  . Diabetes Brother   . Throat cancer Brother        Throat  . Diabetes Sister   . Hypertension Sister   . Throat cancer Other        Throat  . Diabetes Other   . Alcohol abuse Other   . Diabetes Maternal Grandmother        Insulin  . Colon cancer Cousin   . Drug abuse Neg Hx   . Prostate cancer Neg Hx     No Known Allergies  Medication list reviewed and updated in full in [facility].  GEN: no acute illness or fever CV: No chest pain or shortness of  breath MSK: detailed above Neuro: neurological signs are described above ROS O/w per HPI  Objective:   BP 100/60   Pulse (!) 52   Temp 97.8 F (36.6 C) (Oral)   Ht 5\' 10"  (1.778 m)   Wt 230 lb 12 oz (104.7 kg)   BMI 33.11 kg/m    GEN: Well-developed,well-nourished,in no acute distress; alert,appropriate and cooperative throughout examination HEENT: Normocephalic and atraumatic without obvious abnormalities. Ears, externally no deformities PULM: Breathing comfortably in no respiratory distress EXT: No clubbing, cyanosis, or edema PSYCH: Normally interactive. Cooperative during the interview. Pleasant. Friendly and conversant. Not anxious or depressed appearing. Normal, full affect.  Range of motion at  the waist: Flexion, extension, lateral bending and rotation: Forward flexion to approximately 80 degrees.  Extension is normal but causes pain.  Lateral bending and rotational movements have approximate 15% decrease in expected motion.  No echymosis or edema Rises to examination table with mild difficulty Gait: minimally antalgic  Inspection/Deformity: N Paraspinus Tenderness: Diffusely tender from L3 through S1 bilaterally  B Ankle Dorsiflexion (L5,4): 5/5 B Great Toe Dorsiflexion (L5,4): 5/5 Heel Walk (L5): WNL Toe Walk (S1): WNL Rise/Squat (L4): WNL, mild pain  Hip flexion on the right is 4-/5  SENSORY B Medial Foot (L4): WNL B Dorsum (L5): WNL B Lateral (S1): WNL Light Touch: L sided decreased sensation from prior spine surgery, old Pinprick: WNL  REFLEXES Knee (L4): 2+ Ankle (S1): 2+  B SLR, seated: neg B SLR, supine: neg B FABER: neg B Reverse FABER: neg B Greater Troch: NT B Log Roll: neg B Stork: NT B Sciatic Notch: ttp r > l  Radiology: Dg Lumbar Spine Complete  Result Date: 10/27/2017 CLINICAL DATA:  Acute low back pain. EXAM: LUMBAR SPINE - COMPLETE 4+ VIEW COMPARISON:  None. FINDINGS: There is no evidence of lumbar spine fracture. Alignment is  normal. There is narrowing of the L4-5 and L5-S1 disc spaces  with vacuum phenomena. Degenerative facet arthritis primarily at L3-4. IMPRESSION: No acute abnormalities. Degenerative disc disease at L4-5 and L5-S1. Degenerative facet arthritis at L3-4. Electronically Signed   By: Francene Boyers M.D.   On: 10/27/2017 14:53    Assessment and Plan:   Acute bilateral low back pain with right-sided sciatica  Acute bilateral low back pain without sciatica - Plan: MR Lumbar Spine W Wo Contrast, Basic metabolic panel  Right leg weakness - Plan: MR Lumbar Spine W Wo Contrast, Basic metabolic panel  History of lumbosacral spine surgery - Plan: MR Lumbar Spine W Wo Contrast, Basic metabolic panel  Patient with prior spine surgery is doing poorly with acute back pain and some right-sided radicular pain and right-sided weakness at the hip obtain an MRI of the  lumbar spine  with and without contrast to evaluate for spinal stenosis, disc herniation, or other cord or foraminal impingement.  Results will direct further intervention.  Follow-up: No Follow-up on file.  Meds ordered this encounter  Medications  . cyclobenzaprine (FLEXERIL) 10 MG tablet    Sig: Take 0.5-1 tablets (5-10 mg total) by mouth 2 (two) times daily as needed for muscle spasms.    Dispense:  30 tablet    Refill:  1   Medications Discontinued During This Encounter  Medication Reason  . cyclobenzaprine (FLEXERIL) 10 MG tablet Completed Course  . predniSONE (DELTASONE) 20 MG tablet Completed Course   Orders Placed This Encounter  Procedures  . MR Lumbar Spine W Wo Contrast  . Basic metabolic panel    Signed,  Carlee Tesfaye T. Shealynn Saulnier, MD   Allergies as of 11/10/2017   No Known Allergies     Medication List        Accurate as of 11/10/17  1:53 PM. Always use your most recent med list.          aspirin 81 MG tablet Take 81 mg by mouth 2 (two) times daily.   atorvastatin 40 MG tablet Commonly known as:  LIPITOR Take 1  tablet (40 mg total) by mouth daily.   carvedilol 25 MG tablet Commonly known as:  COREG TAKE 1 TABLET BY MOUTH TWICE A DAY WITH A MEAL   cyclobenzaprine 10 MG tablet Commonly known as:  FLEXERIL Take 0.5-1 tablets (5-10 mg total) by mouth 2 (two) times daily as needed for muscle spasms.   doxazosin 1 MG tablet Commonly known as:  CARDURA TAKE 1 TABLET (1 MG TOTAL) BY MOUTH AT BEDTIME.   fish oil-omega-3 fatty acids 1000 MG capsule Take 1 g by mouth 2 (two) times daily.   losartan 25 MG tablet Commonly known as:  COZAAR Take 1 tablet (25 mg total) by mouth daily.   meloxicam 15 MG tablet Commonly known as:  MOBIC Take 1 tablet (15 mg total) by mouth daily.   metFORMIN 500 MG tablet Commonly known as:  GLUCOPHAGE TAKE 1 TABLET (500 MG TOTAL) BY MOUTH 2 (TWO) TIMES DAILY WITH A MEAL.   omeprazole 20 MG capsule Commonly known as:  PRILOSEC TAKE 1 CAPSULE (20 MG TOTAL) BY MOUTH DAILY.   ONE TOUCH ULTRA TEST test strip Generic drug:  glucose blood CHECK BLOOD SUGAR DAILY AND IF BLOOD SUGAR FLUCTUATES. 250.00   onetouch ultrasoft lancets USE DAILY AS NEEDED TO CHECK SUGAR, DX 250.00   PARoxetine 20 MG tablet Commonly known as:  PAXIL TAKE 1 TABLET (20 MG TOTAL) BY MOUTH DAILY.   traMADol 50 MG tablet Commonly known as:  ULTRAM Take 1  tablet (50 mg total) by mouth every 6 (six) hours as needed.

## 2017-11-10 NOTE — Patient Instructions (Signed)

## 2017-11-12 ENCOUNTER — Ambulatory Visit (HOSPITAL_COMMUNITY)
Admission: RE | Admit: 2017-11-12 | Discharge: 2017-11-12 | Disposition: A | Discharge status: Home / Self Care | Payer: Medicare Other | Source: Ambulatory Visit | Attending: Family Medicine | Admitting: Family Medicine

## 2017-11-12 DIAGNOSIS — R531 Weakness: Secondary | ICD-10-CM | POA: Diagnosis not present

## 2017-11-12 DIAGNOSIS — M4316 Spondylolisthesis, lumbar region: Secondary | ICD-10-CM | POA: Insufficient documentation

## 2017-11-12 DIAGNOSIS — Z9889 Other specified postprocedural states: Secondary | ICD-10-CM | POA: Insufficient documentation

## 2017-11-12 DIAGNOSIS — M5126 Other intervertebral disc displacement, lumbar region: Secondary | ICD-10-CM | POA: Diagnosis not present

## 2017-11-12 DIAGNOSIS — R29898 Other symptoms and signs involving the musculoskeletal system: Secondary | ICD-10-CM

## 2017-11-12 DIAGNOSIS — M545 Low back pain, unspecified: Secondary | ICD-10-CM

## 2017-11-12 DIAGNOSIS — M5136 Other intervertebral disc degeneration, lumbar region: Secondary | ICD-10-CM | POA: Diagnosis not present

## 2017-11-12 DIAGNOSIS — M48061 Spinal stenosis, lumbar region without neurogenic claudication: Secondary | ICD-10-CM | POA: Diagnosis not present

## 2017-11-12 MED ORDER — GADOBENATE DIMEGLUMINE 529 MG/ML IV SOLN
20.0000 mL | Freq: Once | INTRAVENOUS | Status: AC | PRN
  Administered 2017-11-12: 20 mL via INTRAVENOUS

## 2017-11-15 ENCOUNTER — Other Ambulatory Visit: Payer: Self-pay | Admitting: Family Medicine

## 2017-11-15 DIAGNOSIS — M5116 Intervertebral disc disorders with radiculopathy, lumbar region: Secondary | ICD-10-CM

## 2017-11-15 DIAGNOSIS — M5416 Radiculopathy, lumbar region: Secondary | ICD-10-CM

## 2017-11-15 DIAGNOSIS — R29898 Other symptoms and signs involving the musculoskeletal system: Secondary | ICD-10-CM

## 2017-11-15 DIAGNOSIS — Z9889 Other specified postprocedural states: Secondary | ICD-10-CM

## 2017-11-15 DIAGNOSIS — M5441 Lumbago with sciatica, right side: Secondary | ICD-10-CM

## 2017-11-15 DIAGNOSIS — M47819 Spondylosis without myelopathy or radiculopathy, site unspecified: Secondary | ICD-10-CM

## 2017-11-15 DIAGNOSIS — M48061 Spinal stenosis, lumbar region without neurogenic claudication: Secondary | ICD-10-CM

## 2017-11-16 ENCOUNTER — Other Ambulatory Visit: Payer: Self-pay | Admitting: Family Medicine

## 2017-11-22 ENCOUNTER — Telehealth: Payer: Self-pay

## 2017-11-22 NOTE — Telephone Encounter (Signed)
Referral placed, will forward this to [facility] for follow up with patient.

## 2017-11-22 NOTE — Telephone Encounter (Signed)
Copied from [place] [id number]. Topic: Quick Communication - See Telephone Encounter >> Nov 22, 2017  1:43 PM Vernona Rieger wrote: CRM for notification. See Telephone encounter for:   11/22/17.  Patient said he was suppose to be getting setup with a neuro surgeon & has not heard anything. Please call patient [phone]

## 2017-11-25 NOTE — Telephone Encounter (Signed)
Appt made Dr Annette Stable on 12/08/17

## 2017-11-27 ENCOUNTER — Other Ambulatory Visit: Payer: Self-pay | Admitting: Family Medicine

## 2017-11-29 ENCOUNTER — Encounter: Payer: Self-pay | Admitting: *Deleted

## 2017-12-08 DIAGNOSIS — M5126 Other intervertebral disc displacement, lumbar region: Secondary | ICD-10-CM | POA: Diagnosis not present

## 2017-12-08 DIAGNOSIS — Z6833 Body mass index (BMI) 33.0-33.9, adult: Secondary | ICD-10-CM | POA: Diagnosis not present

## 2017-12-09 ENCOUNTER — Other Ambulatory Visit: Payer: Self-pay | Admitting: Family Medicine

## 2017-12-09 NOTE — Telephone Encounter (Signed)
Sent. Thanks.   

## 2017-12-09 NOTE — Telephone Encounter (Signed)
Last office visit 11/10/2017.  Last refilled 11/10/2017 for #30 with 1 refill.  Ok to refill?

## 2017-12-14 DIAGNOSIS — M5126 Other intervertebral disc displacement, lumbar region: Secondary | ICD-10-CM | POA: Diagnosis not present

## 2018-01-04 DIAGNOSIS — Z85828 Personal history of other malignant neoplasm of skin: Secondary | ICD-10-CM | POA: Diagnosis not present

## 2018-01-04 DIAGNOSIS — L570 Actinic keratosis: Secondary | ICD-10-CM | POA: Diagnosis not present

## 2018-01-04 DIAGNOSIS — X32XXXA Exposure to sunlight, initial encounter: Secondary | ICD-10-CM | POA: Diagnosis not present

## 2018-01-04 DIAGNOSIS — D225 Melanocytic nevi of trunk: Secondary | ICD-10-CM | POA: Diagnosis not present

## 2018-01-04 DIAGNOSIS — D2262 Melanocytic nevi of left upper limb, including shoulder: Secondary | ICD-10-CM | POA: Diagnosis not present

## 2018-01-04 DIAGNOSIS — Z85820 Personal history of malignant melanoma of skin: Secondary | ICD-10-CM | POA: Diagnosis not present

## 2018-01-04 DIAGNOSIS — D485 Neoplasm of uncertain behavior of skin: Secondary | ICD-10-CM | POA: Diagnosis not present

## 2018-01-08 ENCOUNTER — Other Ambulatory Visit: Payer: Self-pay | Admitting: Family Medicine

## 2018-02-16 ENCOUNTER — Other Ambulatory Visit: Payer: Self-pay | Admitting: Family Medicine

## 2018-02-20 ENCOUNTER — Other Ambulatory Visit: Payer: Self-pay | Admitting: Family Medicine

## 2018-04-07 ENCOUNTER — Other Ambulatory Visit: Payer: Self-pay | Admitting: Family Medicine

## 2018-04-07 DIAGNOSIS — E1165 Type 2 diabetes mellitus with hyperglycemia: Secondary | ICD-10-CM

## 2018-04-08 ENCOUNTER — Telehealth: Payer: Self-pay

## 2018-04-08 ENCOUNTER — Other Ambulatory Visit: Payer: Self-pay | Admitting: Family Medicine

## 2018-04-08 ENCOUNTER — Ambulatory Visit (INDEPENDENT_AMBULATORY_CARE_PROVIDER_SITE_OTHER): Payer: Medicare Other

## 2018-04-08 VITALS — BP 108/70 | HR 46 | Temp 98.6°F | Ht 69.5 in | Wt 225.2 lb

## 2018-04-08 DIAGNOSIS — Z0000 Encounter for general adult medical examination without abnormal findings: Secondary | ICD-10-CM

## 2018-04-08 DIAGNOSIS — E1165 Type 2 diabetes mellitus with hyperglycemia: Secondary | ICD-10-CM

## 2018-04-08 LAB — LIPID PANEL
CHOLESTEROL: 143 mg/dL (ref 0–200)
HDL: 41.9 mg/dL (ref >39.00)
LDL Cholesterol: 75 mg/dL (ref 0–99)
NonHDL: 101.33
Total CHOL/HDL Ratio: 3
Triglycerides: 134 mg/dL (ref 0.0–149.0)
VLDL: 26.8 mg/dL (ref 0.0–40.0)

## 2018-04-08 LAB — COMPREHENSIVE METABOLIC PANEL
ALBUMIN: 4.3 g/dL (ref 3.5–5.2)
ALK PHOS: 80 U/L (ref 39–117)
ALT: 23 U/L (ref 0–53)
AST: 19 U/L (ref 0–37)
BUN: 17 mg/dL (ref 6–23)
CALCIUM: 9.4 mg/dL (ref 8.4–10.5)
CO2: 29 mEq/L (ref 19–32)
Chloride: 102 mEq/L (ref 96–112)
Creatinine, Ser: 0.94 mg/dL (ref 0.40–1.50)
GFR: 85.27 mL/min (ref >60.00)
Glucose, Bld: 271 mg/dL — ABNORMAL HIGH (ref 70–99)
POTASSIUM: 5 meq/L (ref 3.5–5.1)
Sodium: 139 mEq/L (ref 135–145)
TOTAL PROTEIN: 7.3 g/dL (ref 6.0–8.3)
Total Bilirubin: 0.8 mg/dL (ref 0.2–1.2)

## 2018-04-08 LAB — HEMOGLOBIN A1C: HEMOGLOBIN A1C: 10.4 % — AB (ref 4.6–6.5)

## 2018-04-08 MED ORDER — METFORMIN HCL 500 MG PO TABS: ORAL_TABLET | ORAL | Status: DC

## 2018-04-08 NOTE — Progress Notes (Signed)
Subjective:   Tim Mccullough is a 67 y.o. male who presents for Medicare Annual/Subsequent preventive examination.  Review of Systems:  N/A Cardiac Risk Factors include: advanced age (>37men, >69 women);male gender;diabetes mellitus;dyslipidemia;obesity (BMI >30kg/m2);hypertension     Objective:    Vitals: BP 108/70 (BP Location: Right Arm, Patient Position: Sitting, Cuff Size: Normal)   Pulse (!) 46   Temp 98.6 F (37 C) (Oral)   Ht 5' 9.5" (1.765 m) Comment: no shoes  Wt 225 lb 4 oz (102.2 kg)   SpO2 98%   BMI 32.79 kg/m   Body mass index is 32.79 kg/m.  Advanced Directives 04/08/2018 02/19/2017 02/26/2016 02/12/2016 06/02/2012 06/01/2012 05/25/2012  Does Patient Have a Medical Advance Directive? No No No No Patient does not have advance directive - Patient does not have advance directive  Would patient like information on creating a medical advance directive? Yes (MAU/Ambulatory/Procedural Areas - Information given) - - - - - -  Pre-existing out of facility DNR order (yellow form or pink MOST form) - - - - No No -    Tobacco Social History   Tobacco Use  Smoking Status Former Smoker  . Years: 20.00  . Types: Cigarettes  . Last attempt to quit: 11/09/1998  . Years since quitting: 19.4  Smokeless Tobacco Former Systems developer  . Quit date: 06/03/2007  Tobacco Comment   no alcohol since nov 12     Counseling given: No Comment: no alcohol since nov 12   Clinical Intake:  Pre-visit preparation completed: Yes  Pain : No/denies pain Pain Score: 0-No pain     Nutritional Status: BMI 25 -29 Overweight Nutritional Risks: None Diabetes: Yes CBG done?: No Did pt. bring in CBG monitor from home?: No  How often do you need to have someone help you when you read instructions, pamphlets, or other written materials from your doctor or pharmacy?: 1 - Never What is the last grade level you completed in school?: Associates degree  Interpreter Needed?: No  Comments: pt is divorced and  lives alone Information entered by :: LPinson, LPN  Past Medical History:  Diagnosis Date  . Abnormal echocardiogram 2/11   Repeated after med mgmt of cardiomyopathy showed EF 40-45%, mildly dilated LV, mild LV hypertrophy, anterolateral and apical hypokinesis, mild diastolic dysfunction.  . Anemia    hx  . Arthritis   . Blood transfusion without reported diagnosis 09/2012  . BPH (benign prostatic hypertrophy) 2004  . Cancer ([place])    melanoma back  . Cardiomyopathy    ? tachycardia-mediated  . Depression   . Diabetes mellitus 11/2006   Type II  . Diverticulosis   . Echocardiogram abnormal 7/10   Mild LVH with a mildly dilated LV.  EF was difficult to assess given poor acoustic windows but appeared moderately decreased.  Mild MR.  Probable small perimembranous VSD, mild LAE  . Esophageal erosions   . GERD (gastroesophageal reflux disease)   . Hip pain   . History of MRI 7/10   Cardiac MRI was done to followup difficult echo.  This showed mild to moderately dilated LV, mild to moderate LAE, EF  33% with global hypokinesis, normal RV size with mild systolic dysfunction.  There was no large VSD evident.  There was no myocardial delayed enhancement so no evidence for infiltrative disease or infarction.  LHC (7/10) showed only luminal irreg. in the coronaries and EF 35%  . Holter monitor, abnormal 7/10   Frequent PAC's and PVC's  Average HR  96  (but he had started Toprol XL).  HR range 71-130  . Hyperlipidemia 04/1998  . Hypertension 1995  . Obesity   . OSA (obstructive sleep apnea)    Not using CPAP- improved with weight loss  . Pneumonia    HX OF pna  . Sinus tachycardia    Pt says he has been told his heart rate is high since he was in high school.   . Sleep apnea    has cpap; but does not use  . VSD (ventricular septal defect)    Probable small perimembranous   Past Surgical History:  Procedure Laterality Date  . CARDIAC CATHETERIZATION  05/30/2009   Dilated cardiomyopathy.   min VSD.  Hypokin Inf  wall.  Multiple min luminal Irr.  EF 35%  . COLONOSCOPY W/ BIOPSIES  08/24/2006   Divertics, polyps x3, bx negative, repeat 2012 with mild diverticulosis in teh sigmoid colon and 1 polyp in tranverse colon, repeat due 2017  . ESOPHAGOGASTRODUODENOSCOPY  2012   1)Barrett's esoph, 2) Erosive esophagitis, 3) Mild gastritis in the antrum, 4) Duodenitis in the bulb of duodenum, 5) Small hiatal hernia  . INGUINAL HERNIA REPAIR  1985   Left  . INGUINAL HERNIA REPAIR  1999   Right  . KNEE ARTHROSCOPY  10/15/2011   Procedure: ARTHROSCOPY KNEE;  Surgeon: Ninetta Lights, MD;  Location: [facility];  Service: Orthopedics;  Laterality: Right;  right knee scope with lateral meniscectomy, removal loose foreign body, and microfracture technique  . LAMINECTOMY  1987   with discectomy  . ROTATOR CUFF REPAIR  2/11   rt  . TOTAL HIP ARTHROPLASTY  2011   Left  . TOTAL KNEE ARTHROPLASTY  06/01/2012   Procedure: TOTAL KNEE ARTHROPLASTY;  Surgeon: Ninetta Lights, MD;  Location: [facility];  Service: Orthopedics;  Laterality: Right;   Family History  Problem Relation Age of Onset  . Heart disease Father        CABG  . Stroke Father   . Diabetes Father   . Hypertension Mother   . Alzheimer's disease Mother   . Diabetes Sister   . Hypertension Sister   . Cancer Sister        uterine cancer  . Diabetes Brother   . Throat cancer Brother        Throat  . Diabetes Sister   . Hypertension Sister   . Throat cancer Other        Throat  . Diabetes Other   . Alcohol abuse Other   . Diabetes Maternal Grandmother        Insulin  . Colon cancer Cousin   . Drug abuse Neg Hx   . Prostate cancer Neg Hx    Social History   Socioeconomic History  . Marital status: Single    Spouse name: Not on file  . Number of children: Not on file  . Years of education: Not on file  . Highest education level: Not on file  Occupational History  . Occupation: Systems analyst:  [employer]  . Occupation: [place]  . Financial resource strain: Not on file  . Food insecurity:    Worry: Not on file    Inability: Not on file  . Transportation needs:    Medical: Not on file    Non-medical: Not on file  Tobacco Use  . Smoking status: Former Smoker    Years: 20.00    Types:  Cigarettes    Last attempt to quit: 11/09/1998    Years since quitting: 19.4  . Smokeless tobacco: Former Systems developer    Quit date: 06/03/2007  . Tobacco comment: no alcohol since nov 12  Substance and Sexual Activity  . Alcohol use: No    Alcohol/week: 0.0 oz    Comment: sober as 09/2011  . Drug use: No  . Sexual activity: Not Currently  Lifestyle  . Physical activity:    Days per week: Not on file    Minutes per session: Not on file  . Stress: Not on file  Relationships  . Social connections:    Talks on phone: Not on file    Gets together: Not on file    Attends religious service: Not on file    Active member of club or organization: Not on file    Attends meetings of clubs or organizations: Not on file    Relationship status: Not on file  Other Topics Concern  . Not on file  Social History Narrative   Lives in [place], his daughter and her kids moved in with him (2015)   Divorced    Outpatient Encounter Medications as of 04/08/2018  Medication Sig  . aspirin 81 MG tablet Take 81 mg by mouth 2 (two) times daily.  Marland Kitchen atorvastatin (LIPITOR) 40 MG tablet TAKE 1 TABLET BY MOUTH EVERY DAY **PT NEEDS APPT**  . carvedilol (COREG) 25 MG tablet TAKE 1 TABLET BY MOUTH TWICE A DAY WITH A MEAL  . cyclobenzaprine (FLEXERIL) 10 MG tablet TAKE 0.5-1 TABLETS (5-10 MG TOTAL) BY MOUTH 2 (TWO) TIMES DAILY AS NEEDED FOR MUSCLE SPASMS.  Marland Kitchen doxazosin (CARDURA) 1 MG tablet TAKE 1 TABLET (1 MG TOTAL) BY MOUTH AT BEDTIME.  . fish oil-omega-3 fatty acids 1000 MG capsule Take 1 g by mouth 2 (two) times daily.   . Lancets (ONETOUCH ULTRASOFT) lancets USE DAILY AS NEEDED TO  CHECK SUGAR, DX 250.00  . meloxicam (MOBIC) 15 MG tablet Take 1 tablet (15 mg total) by mouth daily.  . metFORMIN (GLUCOPHAGE) 500 MG tablet TAKE 1 TABLET (500 MG TOTAL) BY MOUTH 2 (TWO) TIMES DAILY WITH A MEAL. **PT NEEDS APPT**  . omeprazole (PRILOSEC) 20 MG capsule TAKE 1 CAPSULE (20 MG TOTAL) BY MOUTH DAILY.  . ONE TOUCH ULTRA TEST test strip CHECK BLOOD SUGAR DAILY AND IF BLOOD SUGAR FLUCTUATES. 250.00  . PARoxetine (PAXIL) 20 MG tablet TAKE 1 TABLET BY MOUTH EVERY DAY  . traMADol (ULTRAM) 50 MG tablet Take 1 tablet (50 mg total) by mouth every 6 (six) hours as needed.  Marland Kitchen losartan (COZAAR) 25 MG tablet Take 1 tablet (25 mg total) by mouth daily.   No facility-administered encounter medications on file as of 04/08/2018.     Activities of Daily Living In your present state of health, do you have any difficulty performing the following activities: 04/08/2018  Hearing? N  Vision? Y  Comment intermittent blurred vision   Difficulty concentrating or making decisions? Y  Comment sometimes cannot find words  Walking or climbing stairs? N  Dressing or bathing? N  Doing errands, shopping? N  Preparing Food and eating ? N  Using the Toilet? N  In the past six months, have you accidently leaked urine? N  Do you have problems with loss of bowel control? N  Managing your Medications? N  Managing your Finances? N  Housekeeping or managing your Housekeeping? N  Some recent data might be hidden    Patient Care  Team: Tonia Ghent, MD as PCP - General (Family Medicine)   Assessment:   This is a routine wellness examination for Bethany.   Hearing Screening   125Hz  250Hz  500Hz  1000Hz  2000Hz  3000Hz  4000Hz  6000Hz  8000Hz   Right ear:   40 40 40  40    Left ear:   40 40 40  40    Vision Screening Comments: Nov 2018 @ [facility] - Closed    Exercise Activities and Dietary recommendations Current Exercise Habits: The patient does not participate in regular exercise at present, Exercise  limited by: orthopedic condition(s)  Goals    . Patient Stated     Starting 04/08/2018, I will continue to take medications as prescribed.         Fall Risk Fall Risk  04/08/2018 08/20/2015  Falls in the past year? No No     Depression Screen PHQ 2/9 Scores 04/08/2018 08/20/2015  PHQ - 2 Score 0 0  PHQ- 9 Score 0 -    Cognitive Function MMSE - Mini Mental State Exam 04/08/2018  Orientation to time 5  Orientation to Place 5  Registration 3  Attention/ Calculation 0  Recall 1  Recall-comments unable to recall 2 of 3 words  Language- name 2 objects 0  Language- repeat 1  Language- follow 3 step command 3  Language- read & follow direction 0  Write a sentence 0  Copy design 0  Total score 18     PLEASE NOTE: A Mini-Cog screen was completed. Maximum score is 20. A value of 0 denotes this part of Folstein MMSE was not completed or the patient failed this part of the Mini-Cog screening.   Mini-Cog Screening Orientation to Time - Max 5 pts Orientation to Place - Max 5 pts Registration - Max 3 pts Recall - Max 3 pts Language Repeat - Max 1 pts Language Follow 3 Step Command - Max 3 pts     Immunization History  Administered Date(s) Administered  . Influenza Whole 08/28/1998, 08/09/2010  . Influenza,inj,Quad PF,6+ Mos 07/28/2013, 08/17/2014, 08/20/2015, 08/24/2016, 08/23/2017  . Pneumococcal Conjugate-13 08/23/2017  . Pneumococcal Polysaccharide-23 06/03/2012  . Pneumococcal-Unspecified 06/01/2012  . Td 11/09/1997  . Tdap 08/07/2011, 04/08/2013   Screening Tests Health Maintenance  Topic Date Due  . INFLUENZA VACCINE  06/09/2018  . OPHTHALMOLOGY EXAM  08/04/2018  . FOOT EXAM  08/23/2018  . PNA vac Low Risk Adult (2 of 2 - PPSV23) 08/23/2018  . HEMOGLOBIN A1C  10/08/2018  . TETANUS/TDAP  04/09/2023  . COLONOSCOPY  02/25/2026  . Hepatitis C Screening  Completed       Plan:     I have personally reviewed, addressed, and noted the following in the patient's  chart:  A. Medical and social history B. Use of alcohol, tobacco or illicit drugs  C. Current medications and supplements D. Functional ability and status E.  Nutritional status F.  Physical activity G. Advance directives H. List of other physicians I.  Hospitalizations, surgeries, and ER visits in previous 12 months J.  [place] to include hearing, vision, cognitive, depression L. Referrals and appointments - none  In addition, I have reviewed and discussed with patient certain preventive protocols, quality metrics, and best practice recommendations. A written personalized care plan for preventive services as well as general preventive health recommendations were provided to patient.  See attached scanned questionnaire for additional information.   Signed,   Lindell Noe, MHA, BS, LPN Health Coach

## 2018-04-08 NOTE — Progress Notes (Signed)
PCP notes:   Health maintenance:  A1C - completed per order  Abnormal screenings:   Mini-Cog score: 18/20 MMSE - Mini Mental State Exam 04/08/2018  Orientation to time 5  Orientation to Place 5  Registration 3  Attention/ Calculation 0  Recall 1  Recall-comments unable to recall 2 of 3 words  Language- name 2 objects 0  Language- repeat 1  Language- follow 3 step command 3  Language- read & follow direction 0  Write a sentence 0  Copy design 0  Total score 18   Patient concerns:   Patient reports blood glucose readings range from 200-250.   Nurse concerns:  None  Next PCP appt:   04/15/18 @ 1215  I reviewed health advisor's note, was available for consultation on the day of service listed in this note, and agree with documentation and plan. Elsie Stain, MD.

## 2018-04-08 NOTE — Telephone Encounter (Signed)
Pt is aware and understands these instructions/updated medication/he will call on Monday morning with BS readings/thx dmf  Take metformin 2 tabs BID (1000mg  BID). Drink plenty of fluids, low carb diet in meantime. Update me about his sugar on Monday. Thanks.  I will ask for staff to track down the prev phone call. I don't see any record of that call in the EMR. ------  Notes recorded by Marrion Coy, CMA on 04/08/2018 at 5:14 PM EDT GD-Patient states that he called back in February to get an appt and was asked to wait until his appt in May/he said that he takes Metformin 500mg  bid and his BS were running high back in February up to 300/he has polyuria and polydypsia/he has been really concerned/he does not know the name of who he talked with/I advised him to expect a change in his medication and he said that he really wants to get it figured out because he is scared/plz advise/thx dmf ------  Notes recorded by Tonia Ghent, MD on 04/08/2018 at 1:22 PM EDT Call pt. A1c is way up. Will Tim Mccullough/w pt at [facility]. See how his sugar is currently, how he is feeling, if still on metformin. Thanks.

## 2018-04-08 NOTE — Patient Instructions (Signed)
Tim Mccullough , Thank you for taking time to come for your Medicare Wellness Visit. I appreciate your ongoing commitment to your health goals. Please review the following plan we discussed and let me know if I can assist you in the future.   These are the goals we discussed: Goals    . Patient Stated     Starting 04/08/2018, I will continue to take medications as prescribed.         This is a list of the screening recommended for you and due dates:  Health Maintenance  Topic Date Due  . Flu Shot  06/09/2018  . Eye exam for diabetics  08/04/2018  . Complete foot exam   08/23/2018  . Pneumonia vaccines (2 of 2 - PPSV23) 08/23/2018  . Hemoglobin A1C  10/08/2018  . Tetanus Vaccine  04/09/2023  . Colon Cancer Screening  02/25/2026  .  Hepatitis C: One time screening is recommended by Center for Disease Control  (CDC) for  adults born from [birth year] through 1964.   Completed   Preventive Care for Adults  A healthy lifestyle and preventive care can promote health and wellness. Preventive health guidelines for adults include the following key practices.  . A routine yearly physical is a good way to check with your health care provider about your health and preventive screening. It is a chance to share any concerns and updates on your health and to receive a thorough exam.  . Visit your dentist for a routine exam and preventive care every 6 months. Brush your teeth twice a day and floss once a day. Good oral hygiene prevents tooth decay and gum disease.  . The frequency of eye exams is based on your age, health, family medical history, use  of contact lenses, and other factors. Follow your health care provider's recommendations for frequency of eye exams.  . Eat a healthy diet. Foods like vegetables, fruits, whole grains, low-fat dairy products, and lean protein foods contain the nutrients you need without too many calories. Decrease your intake of foods high in solid fats, added sugars, and salt.  Eat the right amount of calories for you. Get information about a proper diet from your health care provider, if necessary.  . Regular physical exercise is one of the most important things you can do for your health. Most adults should get at least 150 minutes of moderate-intensity exercise (any activity that increases your heart rate and causes you to sweat) each week. In addition, most adults need muscle-strengthening exercises on 2 or more days a week.  Silver Sneakers may be a benefit available to you. To determine eligibility, you may visit the website: www.silversneakers.com or contact program at (432) 144-0297 Mon-Fri between 8AM-8PM.   . Maintain a healthy weight. The body mass index (BMI) is a screening tool to identify possible weight problems. It provides an estimate of body fat based on height and weight. Your health care provider can find your BMI and can help you achieve or maintain a healthy weight.   For adults 20 years and older: ? A BMI below 18.5 is considered underweight. ? A BMI of 18.5 to 24.9 is normal. ? A BMI of 25 to 29.9 is considered overweight. ? A BMI of 30 and above is considered obese.   . Maintain normal blood lipids and cholesterol levels by exercising and minimizing your intake of saturated fat. Eat a balanced diet with plenty of fruit and vegetables. Blood tests for lipids and cholesterol should begin at  age 56 and be repeated every 5 years. If your lipid or cholesterol levels are high, you are over 50, or you are at high risk for heart disease, you may need your cholesterol levels checked more frequently. Ongoing high lipid and cholesterol levels should be treated with medicines if diet and exercise are not working.  . If you smoke, find out from your health care provider how to quit. If you do not use tobacco, please do not start.  . If you choose to drink alcohol, please do not consume more than 2 drinks per day. One drink is considered to be 12 ounces (355  mL) of beer, 5 ounces (148 mL) of wine, or 1.5 ounces (44 mL) of liquor.  . If you are 28-45 years old, ask your health care provider if you should take aspirin to prevent strokes.  . Use sunscreen. Apply sunscreen liberally and repeatedly throughout the day. You should seek shade when your shadow is shorter than you. Protect yourself by wearing long sleeves, pants, a wide-brimmed hat, and sunglasses year round, whenever you are outdoors.  . Once a month, do a whole body skin exam, using a mirror to look at the skin on your back. Tell your health care provider of new moles, moles that have irregular borders, moles that are larger than a pencil eraser, or moles that have changed in shape or color.

## 2018-04-11 NOTE — Telephone Encounter (Signed)
That is reasonable given the recent changes.  Would continue as is.  Update me as needed.  Have him keep checking sugar episodically between now and the OV.  Thanks.

## 2018-04-11 NOTE — Telephone Encounter (Signed)
Patient advised.

## 2018-04-11 NOTE — Telephone Encounter (Signed)
Pt states he was advised to call this morning. 04/11/18 pt fasting blood sugar before eating and right after medication was 204.

## 2018-04-15 ENCOUNTER — Encounter: Payer: Self-pay | Admitting: Family Medicine

## 2018-04-15 ENCOUNTER — Ambulatory Visit (INDEPENDENT_AMBULATORY_CARE_PROVIDER_SITE_OTHER): Payer: Medicare Other | Admitting: Family Medicine

## 2018-04-15 VITALS — BP 108/70 | HR 57 | Temp 98.6°F | Ht 69.5 in | Wt 225.2 lb

## 2018-04-15 DIAGNOSIS — E1165 Type 2 diabetes mellitus with hyperglycemia: Secondary | ICD-10-CM | POA: Diagnosis not present

## 2018-04-15 DIAGNOSIS — Z0000 Encounter for general adult medical examination without abnormal findings: Secondary | ICD-10-CM

## 2018-04-15 DIAGNOSIS — F341 Dysthymic disorder: Secondary | ICD-10-CM

## 2018-04-15 DIAGNOSIS — M1990 Unspecified osteoarthritis, unspecified site: Secondary | ICD-10-CM | POA: Diagnosis not present

## 2018-04-15 DIAGNOSIS — Z7189 Other specified counseling: Secondary | ICD-10-CM

## 2018-04-15 DIAGNOSIS — G4730 Sleep apnea, unspecified: Secondary | ICD-10-CM

## 2018-04-15 DIAGNOSIS — M549 Dorsalgia, unspecified: Secondary | ICD-10-CM

## 2018-04-15 DIAGNOSIS — I10 Essential (primary) hypertension: Secondary | ICD-10-CM | POA: Diagnosis not present

## 2018-04-15 MED ORDER — CARVEDILOL 25 MG PO TABS: ORAL_TABLET | ORAL | Status: DC

## 2018-04-15 MED ORDER — METFORMIN HCL 500 MG PO TABS: ORAL_TABLET | ORAL | 3 refills | Status: DC

## 2018-04-15 NOTE — Patient Instructions (Addendum)
Check with your insurance to see if they will cover the shingrix shot. Cut the carvedilol back to 1/2 tab twice a day.  Keep taking metformin as is.  Update me in about 1 week about your sugar and pulse.   Take care.  Glad to see you.  Recheck in 3 months.  The only lab you need to have done for your next diabetic visit is an A1c.  We can do this with a fingerstick test at the office visit.  You do not need a lab visit ahead of time for this.  It does not matter if you are fasting when the lab is done.

## 2018-04-15 NOTE — Progress Notes (Signed)
Back pain.  Clearly better after surgery.  He is putting up with the stiffness and trying to be active.  His R leg is getting stronger.  He is working on leg exercises.  He had sugar elevation over the last few months.  He was less active prior to surgery and that affected his activity/sugar.  His sense of taste was affected after surgery.  Spicy foods are not as "hot" now.    Hypertension:    Using medication without problems or lightheadedness:  yes Chest pain with exertion:no Edema:no Short of breath:no D/w pt about bradycardia and checking pulse on lower dose of coreg.   Diabetes:  Using medications without difficulties: taking metformin 1000mg  BID Hypoglycemic episodes:no Hyperglycemic episodes: see below Feet problems: no Blood Sugars averaging: 170-200 eye exam within last year:yes Labs d/w pt.   Taking meloxicam for hand pain.  No ADE on med.   Mood d/w pt.  Mood is good.  No ADE on med.  No SI/HI.    OSA resolved with weight loss.    Shingles shot d/w pt.  See AVS.  Recheck memory today.  3/3 attention today.   3/3 recall today.   Advance directive- daughter designated if patient were incapacitated.  Prostate cancer screening and PSA options (with potential risks and benefits of testing vs not testing) were discussed along with recent recs/guidelines.  He declined testing PSA at this point. Colonoscopy 2017  He has yearly derm f/u.    PMH and SH reviewed  Meds, vitals, and allergies reviewed.   ROS: Per HPI unless specifically indicated in ROS section   GEN: nad, alert and oriented HEENT: mucous membranes moist NECK: supple w/o LA CV: rrr. PULM: ctab, no inc wob ABD: soft, +bs EXT: no edema SKIN: no acute rash  Diabetic foot exam: Normal inspection No skin breakdown No calluses  Normal DP pulses Normal sensation to light touch and monofilament Nails normal

## 2018-04-17 DIAGNOSIS — M549 Dorsalgia, unspecified: Secondary | ICD-10-CM | POA: Insufficient documentation

## 2018-04-17 DIAGNOSIS — Z0000 Encounter for general adult medical examination without abnormal findings: Secondary | ICD-10-CM | POA: Insufficient documentation

## 2018-04-17 NOTE — Assessment & Plan Note (Signed)
Using meloxicam as needed for joint pains, especially in the hands.  No adverse effect of medication.  Discussed.  Routine cautions discussed.

## 2018-04-17 NOTE — Assessment & Plan Note (Signed)
He has relatively low pulse.  Discussed with him about cutting his carvedilol in half, taking 12.5 mg twice a day.  He will update me about his blood pressure and pulse in about a week.  He has some fatigue noted with 25 mg of carvedilol twice a day. Labs d/w pt.

## 2018-04-17 NOTE — Assessment & Plan Note (Addendum)
He was less active and his weight had gone up some, and all of this was understandable given his surgery this spring.  He is working on his diet.  He increased his metformin in the meantime.  His sugars are not at goal but are better in the meantime.  He will update me about his sugars in about 1 week.  I do not want to induce relative hypoglycemia by over adjusting his medications too quickly in the meantime.  Plan on recheck in 3 months.  Rationale discussed with patient.  He agrees. >25 minutes spent in face to face time with patient, >50% spent in counselling or coordination of care.

## 2018-04-17 NOTE — Assessment & Plan Note (Signed)
Mood is good.  No adverse effect on medication.  Continue as is.

## 2018-04-17 NOTE — Assessment & Plan Note (Signed)
Resolved with weight loss.   

## 2018-04-17 NOTE — Assessment & Plan Note (Signed)
Clearly improved after surgery.  He is putting up with some stiffness and trying to be as active as possible.

## 2018-04-17 NOTE — Assessment & Plan Note (Signed)
Shingles shot d/w pt.  See AVS.  Recheck memory today.  3/3 attention today.   3/3 recall today.   Advance directive- daughter designated if patient were incapacitated.  Prostate cancer screening and PSA options (with potential risks and benefits of testing vs not testing) were discussed along with recent recs/guidelines.  He declined testing PSA at this point. Colonoscopy 2017

## 2018-04-17 NOTE — Assessment & Plan Note (Signed)
Advance directive- daughter designated if patient were incapacitated.   

## 2018-04-22 ENCOUNTER — Other Ambulatory Visit: Payer: Self-pay | Admitting: Family Medicine

## 2018-04-22 ENCOUNTER — Telehealth: Payer: Self-pay | Admitting: Family Medicine

## 2018-04-22 MED ORDER — GLIMEPIRIDE 1 MG PO TABS: 1.0000 mg | ORAL_TABLET | Freq: Every day | ORAL | 3 refills | Status: DC

## 2018-04-22 NOTE — Telephone Encounter (Signed)
Patient advised.

## 2018-04-22 NOTE — Telephone Encounter (Signed)
I would continue the lower dose of carvedilol as he has been taking, 1/2 tab BID.   I would try adding on glimepiride 1-2 tabs a day.  Start with 1 tab in the AM for about 5-7 days, increase to 2 tabs in the AM if he has persistent sugar elevation >150 in the AM.  Caution for low sugars with the med addition.  Please update me in about 1-2 weeks, sooner if needed.  Continue metformin as is unless he has GI intolerance.   rx sent.   Thanks.

## 2018-04-22 NOTE — Telephone Encounter (Signed)
Copied from [place] [id number]. Topic: Quick Communication - See Telephone Encounter >> Apr 22, 2018  3:26 PM Boyd Kerbs wrote: CRM for notification. See Telephone encounter for: 04/22/18.  Pt. Called back - Heart rate - 47 beats then reduced medication and now 65 beats per minutes.   Blood sugar 204, 224,   240 today  He was to call back with reading

## 2018-05-08 ENCOUNTER — Telehealth: Payer: Self-pay | Admitting: Family Medicine

## 2018-05-08 NOTE — Telephone Encounter (Signed)
Please get update on patient re: sugar.  Thanks.

## 2018-05-09 NOTE — Telephone Encounter (Signed)
Spoke to patient and was advised that he did get a fasting blood sugar 130 once, but usually it runs between 180-190. Patient stated that he feels pretty good, but naps a lot when he sits down. Patient stated that he thinks that is caused by the medication he is taking to slow his heart rate down.

## 2018-05-10 MED ORDER — GLIMEPIRIDE 1 MG PO TABS: 2.0000 mg | ORAL_TABLET | Freq: Every day | ORAL | Status: DC

## 2018-05-10 NOTE — Telephone Encounter (Signed)
I would try to inc glimepiride up to 3 tabs in the AM but be careful for hypoglycemia.  Have him update me in about 2 weeks.  If any low sugars, then cut back to 2mg  a day.  I expect his sugars to gradually improve with inc in activity but this may be a reasonable temporary measure.  Thanks.   Med list updated, let me know if he needs a new rx.   Would continue metformin as is.

## 2018-05-10 NOTE — Telephone Encounter (Signed)
Left message on patient's voicemail to return call

## 2018-05-11 NOTE — Telephone Encounter (Signed)
Patient notified as instructed by telephone and verbalized understanding. Patient stated that he does not need a refill at this time because he has a 90 day supply. Advised patient to let Dr. Damita Dunnings know when he needs a refill.

## 2018-05-17 ENCOUNTER — Other Ambulatory Visit: Payer: Self-pay | Admitting: Family Medicine

## 2018-05-24 ENCOUNTER — Other Ambulatory Visit: Payer: Self-pay | Admitting: Cardiovascular Disease

## 2018-06-02 ENCOUNTER — Other Ambulatory Visit: Payer: Self-pay | Admitting: Family Medicine

## 2018-06-23 ENCOUNTER — Telehealth: Payer: Self-pay | Admitting: Family Medicine

## 2018-06-23 NOTE — Telephone Encounter (Signed)
Copied from [place] [id number]. Topic: Quick Communication - Rx Refill/Question >> Jun 23, 2018  2:55 PM Oliver Pila B wrote: Medication: glimepiride (AMARYL) 1 MG tablet [[id number]]   Pt called to inform pcp that BS is still running a little bit high; pt is wanting to see if an early refill can be given b/c medication is running out  [facility]

## 2018-06-24 MED ORDER — GLIMEPIRIDE 1 MG PO TABS: 2.0000 mg | ORAL_TABLET | Freq: Every day | ORAL | 3 refills | Status: DC

## 2018-06-24 NOTE — Telephone Encounter (Signed)
Patient is taking 3 mg of Glimepiride per day and is taking Metformin 2 in the morning and 2 at night.   FBS is running 130 - 150.

## 2018-06-24 NOTE — Telephone Encounter (Signed)
Those are reasonable sugars, so I would keep the OV as planned next month.  Thanks.

## 2018-06-24 NOTE — Telephone Encounter (Signed)
rx sent.   How is his sugar running?  Is he taking 2 or 3 mg of glimepiride?  How much metformin is he taking? Let me know in the meantime. Thanks.

## 2018-06-24 NOTE — Telephone Encounter (Signed)
Patient advised.

## 2018-06-30 ENCOUNTER — Other Ambulatory Visit: Payer: Self-pay | Admitting: Cardiovascular Disease

## 2018-07-04 ENCOUNTER — Other Ambulatory Visit: Payer: Self-pay | Admitting: Family Medicine

## 2018-07-07 ENCOUNTER — Other Ambulatory Visit: Payer: Self-pay | Admitting: Family Medicine

## 2018-07-08 ENCOUNTER — Telehealth: Payer: Self-pay | Admitting: Cardiovascular Disease

## 2018-07-08 NOTE — Telephone Encounter (Signed)
-----   Message from Clarisse Gouge sent at 07/01/2018  9:25 AM EDT ----- Regarding: Recall No ans no vm  ----- Message ----- From: Anselm Pancoast, CMA Sent: 06/30/2018  11:10 AM EDT To: Rebeca Alert Burl Scheduling  Please contact patient for a follow up with Dr. Fletcher Anon. Patient requesting refills again.  Thanks, Ivin Booty

## 2018-07-08 NOTE — Telephone Encounter (Signed)
Attempted to call patient and schedule appointment  No answer no vm  Will try again

## 2018-07-13 NOTE — Telephone Encounter (Signed)
No ans no vm   °

## 2018-07-14 NOTE — Telephone Encounter (Signed)
Attempted to call patient. No answer no voicemail.

## 2018-07-18 ENCOUNTER — Encounter: Payer: Self-pay | Admitting: Cardiovascular Disease

## 2018-07-18 ENCOUNTER — Encounter: Payer: Self-pay | Admitting: Family Medicine

## 2018-07-18 ENCOUNTER — Ambulatory Visit: Payer: Medicare Other | Admitting: Family Medicine

## 2018-07-18 ENCOUNTER — Ambulatory Visit (INDEPENDENT_AMBULATORY_CARE_PROVIDER_SITE_OTHER): Payer: Medicare Other | Admitting: Family Medicine

## 2018-07-18 VITALS — BP 120/70 | HR 76 | Temp 98.3°F | Ht 69.5 in | Wt 235.2 lb

## 2018-07-18 DIAGNOSIS — Z23 Encounter for immunization: Secondary | ICD-10-CM

## 2018-07-18 DIAGNOSIS — E119 Type 2 diabetes mellitus without complications: Secondary | ICD-10-CM

## 2018-07-18 DIAGNOSIS — L304 Erythema intertrigo: Secondary | ICD-10-CM | POA: Diagnosis not present

## 2018-07-18 DIAGNOSIS — R202 Paresthesia of skin: Secondary | ICD-10-CM

## 2018-07-18 LAB — POCT GLYCOSYLATED HEMOGLOBIN (HGB A1C): Hemoglobin A1C: 6.6 % — AB (ref 4.0–5.6)

## 2018-07-18 MED ORDER — NYSTATIN 100000 UNIT/GM EX POWD: Freq: Two times a day (BID) | CUTANEOUS | 1 refills | Status: DC

## 2018-07-18 NOTE — Telephone Encounter (Signed)
Sent letter

## 2018-07-18 NOTE — Progress Notes (Signed)
Diabetes:  Using medications without difficulties: yes, see below.   Hypoglycemic episodes: not now, see below.   Hyperglycemic episodes:no Feet problems:no Blood Sugars averaging: ~100-120s, almost always <150 eye exam within last year: due, d/w pt.   A1c way better at 6.6.   Taking 2 glimepiride in the AM.  He has lower sugars with 3 tabs so he cut back to 2 tabs.  80-90s feels lower for him.  No dangerous lows.   Taking 2 metformin BID.   He has been walking.  He is working on weight.  His weight clearly changed after the back surgery.  His back is better.    R hand with numbness in the 2nd and 3rd fingers.  L handed.  Rare L hand sx.  Grip is still okay.  No neck or arm sx.  Going on, off and on, for months.  More noted recently.  He notes early AM sx.    Groin rash, some better with lotrimin but not resolved.    Meds, vitals, and allergies reviewed.   ROS: Per HPI unless specifically indicated in ROS section   GEN: nad, alert and oriented HEENT: mucous membranes moist NECK: supple w/o LA CV: rrr. PULM: ctab, no inc wob ABD: soft, +bs EXT: no edema SKIN: no acute rash except for intertrigo noted.   S/S wnl BUE.  Normal R shoulder elbow wrist ROM.  Normal grip. Normal monofilament BUE.  Normal cap refill.    Diabetic foot exam: Normal inspection No skin breakdown No calluses  Normal DP pulses Normal sensation to light touch and monofilament Nails normal

## 2018-07-18 NOTE — Patient Instructions (Addendum)
If you have any lows, then cut back on the glimepiride and let me know.  Update me as needed.  Your A1c is way better and that is likely due to your effort.   Recheck in about 3 months.  The only lab you need to have done for your next diabetic visit is an A1c.  We can do this with a fingerstick test at the office visit.  You do not need a lab visit ahead of time for this.  It does not matter if you are fasting when the lab is done.    Use an OTC wrist brace.  Try sleeping in it.  See if that helps.  If worse then let me know.    Use nystatin and update me as needed.   Take care.  Glad to see you.

## 2018-07-20 DIAGNOSIS — R202 Paresthesia of skin: Secondary | ICD-10-CM | POA: Insufficient documentation

## 2018-07-20 DIAGNOSIS — L304 Erythema intertrigo: Secondary | ICD-10-CM | POA: Insufficient documentation

## 2018-07-20 NOTE — Assessment & Plan Note (Signed)
No weakness.  Possible carpal tunnel symptoms.  Okay for outpatient follow-up.  Reasonable to try an over-the-counter splint and then update me as needed.  He agrees. See avs.

## 2018-07-20 NOTE — Assessment & Plan Note (Signed)
Use nystatin and update me as needed.  He agrees.

## 2018-07-20 NOTE — Assessment & Plan Note (Signed)
A1c way better at 6.6.   Taking 2 glimepiride in the AM.  He has lower sugars with 3 tabs so he cut back to 2 tabs.  80-90s feels lower for him.  No dangerous lows.   Taking 2 metformin BID.   He has been walking.  He is working on weight.  His weight clearly changed after the back surgery.  His back is better.   See after visit summary.  Recheck in a few months.  He agrees.  Update me as needed.

## 2018-07-28 ENCOUNTER — Other Ambulatory Visit: Payer: Self-pay | Admitting: Cardiovascular Disease

## 2018-08-10 DIAGNOSIS — H5203 Hypermetropia, bilateral: Secondary | ICD-10-CM | POA: Diagnosis not present

## 2018-08-10 DIAGNOSIS — E119 Type 2 diabetes mellitus without complications: Secondary | ICD-10-CM | POA: Diagnosis not present

## 2018-08-10 DIAGNOSIS — H52223 Regular astigmatism, bilateral: Secondary | ICD-10-CM | POA: Diagnosis not present

## 2018-08-10 LAB — HM DIABETES EYE EXAM

## 2018-08-11 LAB — HM DIABETES EYE EXAM

## 2018-08-14 ENCOUNTER — Other Ambulatory Visit: Payer: Self-pay | Admitting: Family Medicine

## 2018-08-15 NOTE — Telephone Encounter (Signed)
Electronic refill Meloxicam Last office visit 07/18/18 Last refill 08/18/17 #90/3

## 2018-08-16 ENCOUNTER — Other Ambulatory Visit: Payer: Self-pay | Admitting: Cardiovascular Disease

## 2018-08-16 NOTE — Telephone Encounter (Signed)
Sent. Thanks.   

## 2018-08-18 ENCOUNTER — Encounter: Payer: Self-pay | Admitting: Family Medicine

## 2018-09-09 ENCOUNTER — Other Ambulatory Visit: Payer: Self-pay | Admitting: Cardiovascular Disease

## 2018-09-30 ENCOUNTER — Encounter: Payer: Self-pay | Admitting: Cardiovascular Disease

## 2018-09-30 ENCOUNTER — Ambulatory Visit (INDEPENDENT_AMBULATORY_CARE_PROVIDER_SITE_OTHER): Payer: Medicare Other | Admitting: Cardiovascular Disease

## 2018-09-30 VITALS — BP 130/80 | HR 98 | Ht 70.0 in | Wt 238.5 lb

## 2018-09-30 DIAGNOSIS — I5022 Chronic systolic (congestive) heart failure: Secondary | ICD-10-CM

## 2018-09-30 DIAGNOSIS — E785 Hyperlipidemia, unspecified: Secondary | ICD-10-CM

## 2018-09-30 DIAGNOSIS — I10 Essential (primary) hypertension: Secondary | ICD-10-CM | POA: Diagnosis not present

## 2018-09-30 DIAGNOSIS — I739 Peripheral vascular disease, unspecified: Secondary | ICD-10-CM | POA: Diagnosis not present

## 2018-09-30 DIAGNOSIS — I779 Disorder of arteries and arterioles, unspecified: Secondary | ICD-10-CM

## 2018-09-30 MED ORDER — CARVEDILOL 12.5 MG PO TABS: 12.5000 mg | ORAL_TABLET | Freq: Two times a day (BID) | ORAL | 3 refills | Status: DC

## 2018-09-30 MED ORDER — LOSARTAN POTASSIUM 25 MG PO TABS: 25.0000 mg | ORAL_TABLET | Freq: Every day | ORAL | 3 refills | Status: DC

## 2018-09-30 MED ORDER — ATORVASTATIN CALCIUM 40 MG PO TABS: ORAL_TABLET | ORAL | 3 refills | Status: DC

## 2018-09-30 NOTE — Patient Instructions (Signed)
Medication Instructions:  No changes  If you need a refill on your cardiac medications before your next appointment, please call your pharmacy.   Lab work: None ordered  Testing/Procedures: None ordered  Follow-Up: At [facility], you and your health needs are our priority.  As part of our continuing mission to provide you with exceptional heart care, we have created designated Provider Care Teams.  These Care Teams include your primary Cardiologist (physician) and Advanced Practice Providers (APPs -  Physician Assistants and Nurse Practitioners) who all work together to provide you with the care you need, when you need it. You will need a follow up appointment in 12 months.  Please call our office 2 months in advance to schedule this appointment.  You may see Dr. Arida or one of the following Advanced Practice Providers on your designated Care Team:   Christopher Berge, NP Ryan Dunn, PA-C . Jacquelyn Visser, PA-C    

## 2018-09-30 NOTE — Progress Notes (Signed)
Cardiology Office Note   Date:  09/30/2018   ID:  Tim Mccullough, DOB 08-19-1951, MRN [id number]  PCP:  Tonia Ghent, MD  Cardiologist:   [name], MD   Chief Complaint  Patient presents with  . other    12 month follow up. Meds reviewed by the pt. verbally. Pt. c/o numbness in hands.       History of Present Illness: Tim Mccullough is a 67 y.o. male who presents for a follow-up visit regarding chronic systolic heart failure due to nonischemic cardiomyopathy. This was diagnosed in 2010. Cardiac catheterization showed minor luminal irregularities without obstructive CAD. Ejection fraction was 35%. Most recent ejection fraction was 50-55% in May of  2013 .  He has other chronic medical conditions that include diabetes, Hyperlipidemia, frequent PACs and essential hypertension.  He had carotid Doppler done in 2017 for carotid bruits which showed mild bilateral internal carotid artery disease.  There was moderate common carotid disease.  However, repeat testing in September 2018 showed improvement.  He has been doing well with no recent chest pain, shortness of breath or palpitations.  He kept on the dose of carvedilol to 12.5 mg twice daily due to bradycardia.  He had back surgery last year and gained about 20 pounds after he was able to lose weight.   Past Medical History:  Diagnosis Date  . Abnormal echocardiogram 2/11   Repeated after med mgmt of cardiomyopathy showed EF 40-45%, mildly dilated LV, mild LV hypertrophy, anterolateral and apical hypokinesis, mild diastolic dysfunction.  . Anemia    hx  . Arthritis   . Blood transfusion without reported diagnosis 09/2012  . BPH (benign prostatic hypertrophy) 2004  . Cancer ([place])    melanoma back  . Cardiomyopathy    ? tachycardia-mediated  . Depression   . Diabetes mellitus 11/2006   Type II  . Diverticulosis   . Echocardiogram abnormal 7/10   Mild LVH with a mildly dilated LV.  EF was difficult to assess given  poor acoustic windows but appeared moderately decreased.  Mild MR.  Probable small perimembranous VSD, mild LAE  . Esophageal erosions   . GERD (gastroesophageal reflux disease)   . Hip pain   . History of MRI 7/10   Cardiac MRI was done to followup difficult echo.  This showed mild to moderately dilated LV, mild to moderate LAE, EF  33% with global hypokinesis, normal RV size with mild systolic dysfunction.  There was no large VSD evident.  There was no myocardial delayed enhancement so no evidence for infiltrative disease or infarction.  LHC (7/10) showed only luminal irreg. in the coronaries and EF 35%  . Holter monitor, abnormal 7/10   Frequent PAC's and PVC's  Average HR 96  (but he had started Toprol XL).  HR range 71-130  . Hyperlipidemia 04/1998  . Hypertension 1995  . Obesity   . OSA (obstructive sleep apnea)    Not using CPAP- improved with weight loss  . Pneumonia    HX OF pna  . Sinus tachycardia    Pt says he has been told his heart rate is high since he was in high school.   . Sleep apnea    has cpap; but does not use  . VSD (ventricular septal defect)    Probable small perimembranous    Past Surgical History:  Procedure Laterality Date  . CARDIAC CATHETERIZATION  05/30/2009   Dilated cardiomyopathy.  min VSD.  Hypokin Inf  wall.  Multiple min luminal Irr.  EF 35%  . COLONOSCOPY W/ BIOPSIES  08/24/2006   Divertics, polyps x3, bx negative, repeat 2012 with mild diverticulosis in teh sigmoid colon and 1 polyp in tranverse colon, repeat due 2017  . ESOPHAGOGASTRODUODENOSCOPY  2012   1)Barrett's esoph, 2) Erosive esophagitis, 3) Mild gastritis in the antrum, 4) Duodenitis in the bulb of duodenum, 5) Small hiatal hernia  . INGUINAL HERNIA REPAIR  1985   Left  . INGUINAL HERNIA REPAIR  1999   Right  . KNEE ARTHROSCOPY  10/15/2011   Procedure: ARTHROSCOPY KNEE;  Surgeon: Ninetta Lights, MD;  Location: [facility];  Service: Orthopedics;  Laterality: Right;   right knee scope with lateral meniscectomy, removal loose foreign body, and microfracture technique  . LAMINECTOMY  1987   with discectomy  . ROTATOR CUFF REPAIR  2/11   rt  . TOTAL HIP ARTHROPLASTY  2011   Left  . TOTAL KNEE ARTHROPLASTY  06/01/2012   Procedure: TOTAL KNEE ARTHROPLASTY;  Surgeon: Ninetta Lights, MD;  Location: [facility];  Service: Orthopedics;  Laterality: Right;     Current Outpatient Medications  Medication Sig Dispense Refill  . aspirin 81 MG tablet Take 81 mg by mouth 2 (two) times daily.    Marland Kitchen atorvastatin (LIPITOR) 40 MG tablet TAKE 1 TABLET BY MOUTH EVERY DAY **PT NEEDS APPT** 90 tablet 2  . carvedilol (COREG) 25 MG tablet 1/2 tab twice a day.    . cyclobenzaprine (FLEXERIL) 10 MG tablet TAKE 0.5-1 TABLETS (5-10 MG TOTAL) BY MOUTH 2 (TWO) TIMES DAILY AS NEEDED FOR MUSCLE SPASMS. 30 tablet 1  . doxazosin (CARDURA) 1 MG tablet TAKE 1 TABLET (1 MG TOTAL) BY MOUTH AT BEDTIME. 90 tablet 2  . fish oil-omega-3 fatty acids 1000 MG capsule Take 1 g by mouth 2 (two) times daily.     Marland Kitchen glimepiride (AMARYL) 1 MG tablet Take 2-3 tablets (2-3 mg total) by mouth daily with breakfast. 270 tablet 3  . Lancets (ONETOUCH ULTRASOFT) lancets USE DAILY AS NEEDED TO CHECK SUGAR, DX 250.00 100 each 2  . losartan (COZAAR) 25 MG tablet Take 1 tablet (25 mg total) by mouth daily. Pt must keep appointment for further refills!!! 30 tablet 0  . meloxicam (MOBIC) 15 MG tablet TAKE 1 TABLET BY MOUTH EVERY DAY 90 tablet 3  . metFORMIN (GLUCOPHAGE) 500 MG tablet 2 tabs twice a day. 360 tablet 3  . nystatin (MYCOSTATIN/NYSTOP) powder Apply topically 2 (two) times daily. 30 g 1  . omeprazole (PRILOSEC) 20 MG capsule TAKE 1 CAPSULE (20 MG TOTAL) BY MOUTH DAILY. 30 capsule 0  . ONE TOUCH ULTRA TEST test strip CHECK BLOOD SUGAR DAILY AND IF BLOOD SUGAR FLUCTUATES. 250.00 100 each 11  . PARoxetine (PAXIL) 20 MG tablet TAKE 1 TABLET BY MOUTH EVERY DAY 90 tablet 1   No current facility-administered  medications for this visit.     Allergies:   Patient has no known allergies.    Social History:  The patient  reports that he quit smoking about 19 years ago. His smoking use included cigarettes. He quit after 20.00 years of use. He quit smokeless tobacco use about 11 years ago. He reports that he does not drink alcohol or use drugs.   Family History:  The patient's family history includes Alcohol abuse in his other; Alzheimer's disease in his mother; Cancer in his sister; Colon cancer in his cousin; Diabetes in his brother, father, maternal grandmother, other, sister, and  sister; Heart disease in his father; Hypertension in his mother, sister, and sister; Stroke in his father; Throat cancer in his brother and other.    ROS:  Please see the history of present illness.   Otherwise, review of systems are positive for none.   All other systems are reviewed and negative.    PHYSICAL EXAM: VS:  BP 130/80 (BP Location: Left Arm, Patient Position: Sitting, Cuff Size: Normal)   Pulse 98   Ht 5\' 10"  (1.778 m)   Wt 238 lb 8 oz (108.2 kg)   BMI 34.22 kg/m  , BMI Body mass index is 34.22 kg/m. GEN: Well nourished, well developed, in no acute distress  HEENT: normal  Neck: no JVD, or masses. Faint left carotid bruit Cardiac: RRR; no murmurs, rubs, or gallops,no edema  Respiratory:  clear to auscultation bilaterally, normal work of breathing GI: soft, nontender, nondistended, + BS MS: no deformity or atrophy  Skin: warm and dry, no rash Neuro:  Strength and sensation are intact Psych: euthymic mood, full affect   EKG:  EKG is ordered today. The ekg ordered today demonstrates normal sinus rhythm with frequent PACs   Recent Labs: 04/08/2018: ALT 23; BUN 17; Creatinine, Ser 0.94; Potassium 5.0; Sodium 139    Lipid Panel    Component Value Date/Time   CHOL 143 04/08/2018 0927   CHOL 178 08/28/2016 0806   TRIG 134.0 04/08/2018 0927   HDL 41.90 04/08/2018 0927   HDL 48 08/28/2016 0806    CHOLHDL 3 04/08/2018 0927   VLDL 26.8 04/08/2018 0927   LDLCALC 75 04/08/2018 0927   LDLCALC 106 (H) 08/28/2016 0806   LDLDIRECT 138.8 06/17/2011 0850      Wt Readings from Last 3 Encounters:  09/30/18 238 lb 8 oz (108.2 kg)  07/18/18 235 lb 4 oz (106.7 kg)  04/15/18 225 lb 4 oz (102.2 kg)         ASSESSMENT AND PLAN:  1.  Chronic systolic heart failure: Most recent ejection fraction was 50-55% in 2013. He appears to be euvolemic. Continue treatment with carvedilol and losartan.  2. Essential hypertension: Blood pressure is reasonably controlled on losartan and carvedilol.  3. Hyperlipidemia: Continue treatment with atorvastatin.  Most recent lipid profile showed an LDL of 75.  4. Bilateral carotid disease: Repeat carotid Doppler in July 11, 2017 showed only mild nonobstructive disease.  Continue to follow clinically.  5.  Frequent PACs: Currently on carvedilol.  He cut down the dose to 12.5 mg twice daily due to bradycardia.  Bradycardia could have been a misread by his machine.  Continue to monitor for now and if needed we can increase the dose back to 25 mg twice daily.   Disposition:   FU with me in 1 year  Signed,  [name], MD  09/30/2018 2:57 PM    [facility]

## 2018-10-20 ENCOUNTER — Encounter: Payer: Self-pay | Admitting: Family Medicine

## 2018-10-20 ENCOUNTER — Ambulatory Visit (INDEPENDENT_AMBULATORY_CARE_PROVIDER_SITE_OTHER): Payer: Medicare Other | Admitting: Family Medicine

## 2018-10-20 VITALS — BP 102/70 | HR 50 | Temp 97.8°F | Ht 70.0 in | Wt 228.0 lb

## 2018-10-20 DIAGNOSIS — Z23 Encounter for immunization: Secondary | ICD-10-CM | POA: Diagnosis not present

## 2018-10-20 DIAGNOSIS — B356 Tinea cruris: Secondary | ICD-10-CM | POA: Diagnosis not present

## 2018-10-20 DIAGNOSIS — M1990 Unspecified osteoarthritis, unspecified site: Secondary | ICD-10-CM

## 2018-10-20 DIAGNOSIS — I5022 Chronic systolic (congestive) heart failure: Secondary | ICD-10-CM | POA: Diagnosis not present

## 2018-10-20 DIAGNOSIS — E119 Type 2 diabetes mellitus without complications: Secondary | ICD-10-CM

## 2018-10-20 LAB — POCT GLYCOSYLATED HEMOGLOBIN (HGB A1C): Hemoglobin A1C: 6.4 % — AB (ref 4.0–5.6)

## 2018-10-20 MED ORDER — NYSTATIN 100000 UNIT/GM EX POWD: Freq: Two times a day (BID) | CUTANEOUS | 3 refills | Status: DC | PRN

## 2018-10-20 NOTE — Progress Notes (Signed)
Recheck pulse 50.  He had may have had artificially elevated pulse on pulse ox meter.  He didn't feel racing at time of intake this AM.  Manual check is regular but mildly brady.  He can check his pulse at home. D/w pt.  Only rarely lightheaded.    Rash in groin, some better in the meantime.    Diabetes:  Using medications without difficulties: yes Hypoglycemic episodes:not unless prolonged fasting, cautions d/w pt.  Hyperglycemic episodes:no Feet problems:no Blood Sugars averaging: max 140-150 eye exam within last year: yes He is taking 2 metformin BID He is taking 2 glimepiride in the AM and 1 in the PM.  a1c 6.4  D/w pt at OV.   He is working on his diet.     He wanted a trial off meloxicam to see if it was doing any good.  D/w pt.  His hand pain is some better, unclear if from medicine.    Meds, vitals, and allergies reviewed.   ROS: Per HPI unless specifically indicated in ROS section   GEN: nad, alert and oriented HEENT: mucous membranes moist NECK: supple w/o LA CV: rrr. PULM: ctab, no inc wob ABD: soft, +bs EXT: no edema SKIN: Mild tinea cruris noted.

## 2018-10-20 NOTE — Patient Instructions (Addendum)
Stop the meloxicam and see how you feel.    The only lab you need to have done for your next diabetic visit is an A1c.  We can do this with a fingerstick test at the office visit.  You do not need a lab visit ahead of time for this.  It does not matter if you are fasting when the lab is done.    Recheck in about 3 months.   If you notice troubles with low or high pulse then let or cardiology know.   Take care.  Glad to see you.

## 2018-10-23 DIAGNOSIS — B356 Tinea cruris: Secondary | ICD-10-CM | POA: Insufficient documentation

## 2018-10-23 NOTE — Assessment & Plan Note (Signed)
He wanted a trial off of meloxicam to see if it was actually doing any good.  This is reasonable.  He can update me as needed.

## 2018-10-23 NOTE — Assessment & Plan Note (Signed)
Can use nystatin powder as needed.  Update me as needed.  He agrees.

## 2018-10-23 NOTE — Assessment & Plan Note (Signed)
He is currently treated with a beta-blocker.  He has a history of sinus tachycardia in the past.  I do not want to have him go back into sinus tachycardia.  I doubt he truly was in sinus tachycardia on intake today-he agrees.  I think he may have had his pulse in the 60s with the pulse ox meter artificially registering double speed, meaning in the 120s.  He is not having symptomatic bradycardia otherwise.  He can check his pulse at home and update me as needed.  He agrees.  Routine cautions given.

## 2018-10-23 NOTE — Assessment & Plan Note (Signed)
He is taking 2 metformin BID He is taking 2 glimepiride in the AM and 1 in the PM.  a1c 6.4  D/w pt at OV.   He is working on his diet.    I thanked him for his effort. No change in meds at this point.  See after visit summary.

## 2018-10-28 ENCOUNTER — Other Ambulatory Visit: Payer: Self-pay | Admitting: Family Medicine

## 2018-11-04 ENCOUNTER — Telehealth: Payer: Self-pay | Admitting: Family Medicine

## 2018-11-04 ENCOUNTER — Other Ambulatory Visit: Payer: Self-pay | Admitting: *Deleted

## 2018-11-04 MED ORDER — METFORMIN HCL 500 MG PO TABS: ORAL_TABLET | ORAL | 1 refills | Status: DC

## 2018-11-04 NOTE — Telephone Encounter (Signed)
Spoke to pt who states the Rx was sent to his pharmacy incorrect. He states Rx should be 2tabs BID. Requesting a new rx be sent to pharmacy on file

## 2018-11-04 NOTE — Telephone Encounter (Signed)
New Rx with updated instructions [facility] to [facility] [address].

## 2018-11-04 NOTE — Telephone Encounter (Signed)
Pt called office stating Dr.Duncan changed the instruction on the metformin. Pt was told to start taking 2 a day and now the pt is out of medication. Pt was told by [facility] to call the office to get a prescription sent with new instructions. Pt uses [facility] on [address].

## 2018-11-07 MED ORDER — METFORMIN HCL 500 MG PO TABS: 1000.0000 mg | ORAL_TABLET | Freq: Two times a day (BID) | ORAL | 1 refills | Status: DC

## 2018-11-07 NOTE — Addendum Note (Signed)
Addended by: Ria Bush on: 11/07/2018 11:17 PM   Modules accepted: Orders

## 2018-11-07 NOTE — Telephone Encounter (Signed)
Have updated metformin Rx. plz notify pt.

## 2018-11-08 NOTE — Telephone Encounter (Signed)
Patient advised.

## 2018-11-09 NOTE — Telephone Encounter (Signed)
Noted. Thanks.

## 2018-12-27 ENCOUNTER — Other Ambulatory Visit: Payer: Self-pay | Admitting: Family Medicine

## 2019-01-03 DIAGNOSIS — D2272 Melanocytic nevi of left lower limb, including hip: Secondary | ICD-10-CM | POA: Diagnosis not present

## 2019-01-03 DIAGNOSIS — Z85820 Personal history of malignant melanoma of skin: Secondary | ICD-10-CM | POA: Diagnosis not present

## 2019-01-03 DIAGNOSIS — D2262 Melanocytic nevi of left upper limb, including shoulder: Secondary | ICD-10-CM | POA: Diagnosis not present

## 2019-01-03 DIAGNOSIS — Z08 Encounter for follow-up examination after completed treatment for malignant neoplasm: Secondary | ICD-10-CM | POA: Diagnosis not present

## 2019-01-03 DIAGNOSIS — L304 Erythema intertrigo: Secondary | ICD-10-CM | POA: Diagnosis not present

## 2019-01-03 DIAGNOSIS — D2271 Melanocytic nevi of right lower limb, including hip: Secondary | ICD-10-CM | POA: Diagnosis not present

## 2019-01-03 DIAGNOSIS — D2261 Melanocytic nevi of right upper limb, including shoulder: Secondary | ICD-10-CM | POA: Diagnosis not present

## 2019-01-03 DIAGNOSIS — L570 Actinic keratosis: Secondary | ICD-10-CM | POA: Diagnosis not present

## 2019-01-03 DIAGNOSIS — D225 Melanocytic nevi of trunk: Secondary | ICD-10-CM | POA: Diagnosis not present

## 2019-01-03 DIAGNOSIS — X32XXXA Exposure to sunlight, initial encounter: Secondary | ICD-10-CM | POA: Diagnosis not present

## 2019-01-03 DIAGNOSIS — Z85828 Personal history of other malignant neoplasm of skin: Secondary | ICD-10-CM | POA: Diagnosis not present

## 2019-01-20 ENCOUNTER — Encounter: Payer: Self-pay | Admitting: Family Medicine

## 2019-01-20 ENCOUNTER — Ambulatory Visit (INDEPENDENT_AMBULATORY_CARE_PROVIDER_SITE_OTHER): Payer: Medicare Other | Admitting: Family Medicine

## 2019-01-20 ENCOUNTER — Other Ambulatory Visit: Payer: Self-pay

## 2019-01-20 VITALS — BP 102/60 | HR 43 | Temp 98.1°F | Ht 70.0 in | Wt 240.4 lb

## 2019-01-20 DIAGNOSIS — E119 Type 2 diabetes mellitus without complications: Secondary | ICD-10-CM | POA: Diagnosis not present

## 2019-01-20 LAB — POCT GLYCOSYLATED HEMOGLOBIN (HGB A1C): Hemoglobin A1C: 6.9 % — AB (ref 4.0–5.6)

## 2019-01-20 MED ORDER — GLIMEPIRIDE 1 MG PO TABS: ORAL_TABLET | ORAL | Status: DC

## 2019-01-20 MED ORDER — METFORMIN HCL 500 MG PO TABS: 1000.0000 mg | ORAL_TABLET | Freq: Two times a day (BID) | ORAL | 3 refills | Status: DC

## 2019-01-20 NOTE — Progress Notes (Signed)
Diabetes:  Using medications without difficulties:yes Hypoglycemic episodes:only if prolonged fasting, cautions d/w pt  Hyperglycemic episodes:no Feet problems:no Blood Sugars averaging: 120-130s in the AM eye exam within last year: yes A1c 6.9.    He isn't lightheaded.  He is still taking coreg 25mg  BID, d/w pt about cutting his current pills to use up his supply.  That should help with pulse.    Meds, vitals, and allergies reviewed.   ROS: Per HPI unless specifically indicated in ROS section   GEN: nad, alert and oriented HEENT: mucous membranes moist NECK: supple w/o LA CV: brady but regular, d/w pt PULM: ctab, no inc wob ABD: soft, +bs EXT: no edema SKIN: well perfused.

## 2019-01-20 NOTE — Patient Instructions (Addendum)
If your pulse doesn't come up (above 60) with the lower dose of carvedilol then let me know.  Recheck labs prior to a physical in 04/2019.  Take care.  Glad to see you.

## 2019-01-22 NOTE — Assessment & Plan Note (Signed)
A1c 6.9.  No change in meds at this point. Recheck labs prior to a physical in 04/2019.  He agrees with plan.  He isn't lightheaded.  He is still taking coreg 25mg  BID, d/w pt about cutting his current pills to use up his supply.  That should help with pulse.    If his pulse doesn't come up (above 60) with the lower dose of carvedilol then he can let me know.

## 2019-03-17 ENCOUNTER — Encounter: Payer: Self-pay | Admitting: Gastroenterology

## 2019-03-25 ENCOUNTER — Other Ambulatory Visit: Payer: Self-pay | Admitting: Family Medicine

## 2019-04-18 IMAGING — DX DG LUMBAR SPINE COMPLETE 4+V
5 series · 5 of 5 positions shown · non-contrast
Comparison: None.

CLINICAL DATA: Acute low back pain.

EXAM:
LUMBAR SPINE - COMPLETE 4+ VIEW

[l-spine ap]
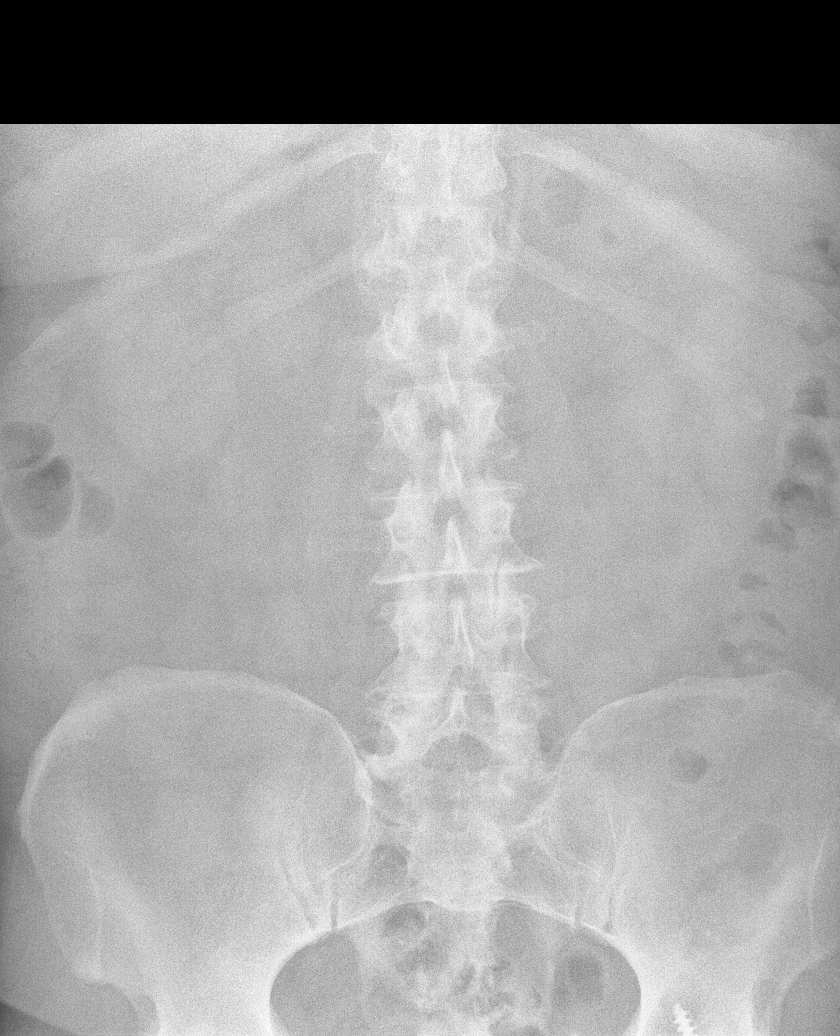

[l-spine obl (1 of 2)]
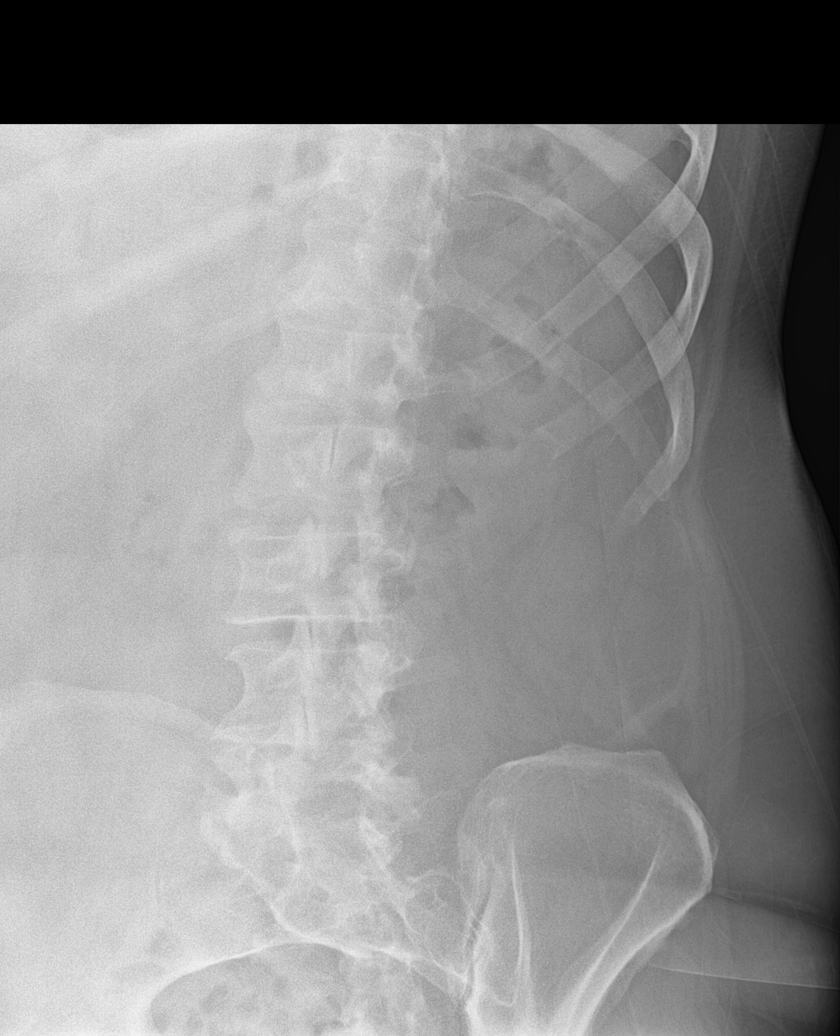

[l-spine obl (2 of 2)]
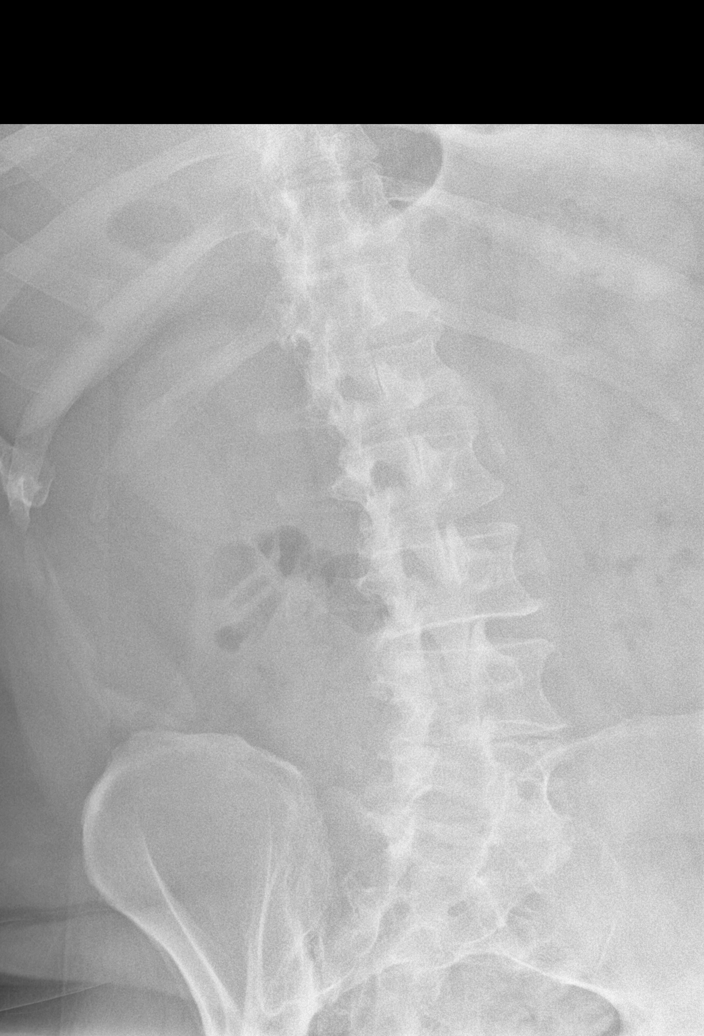

[l-spine lat]
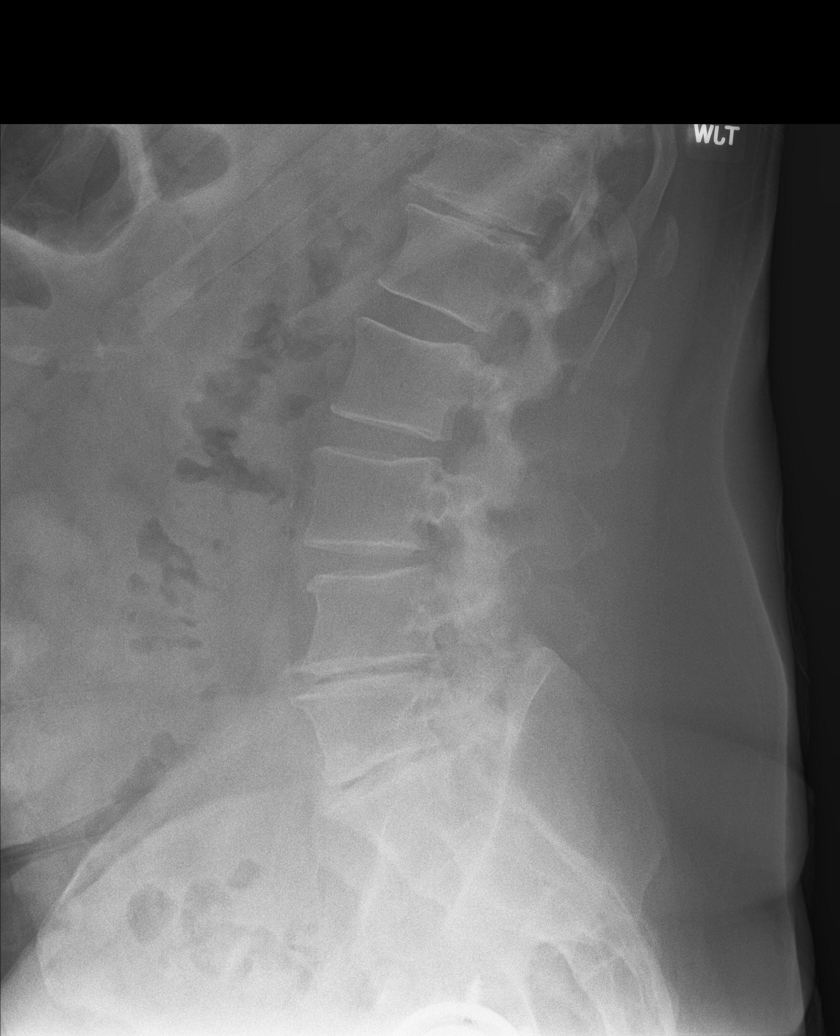

[l-spine l5/s1]
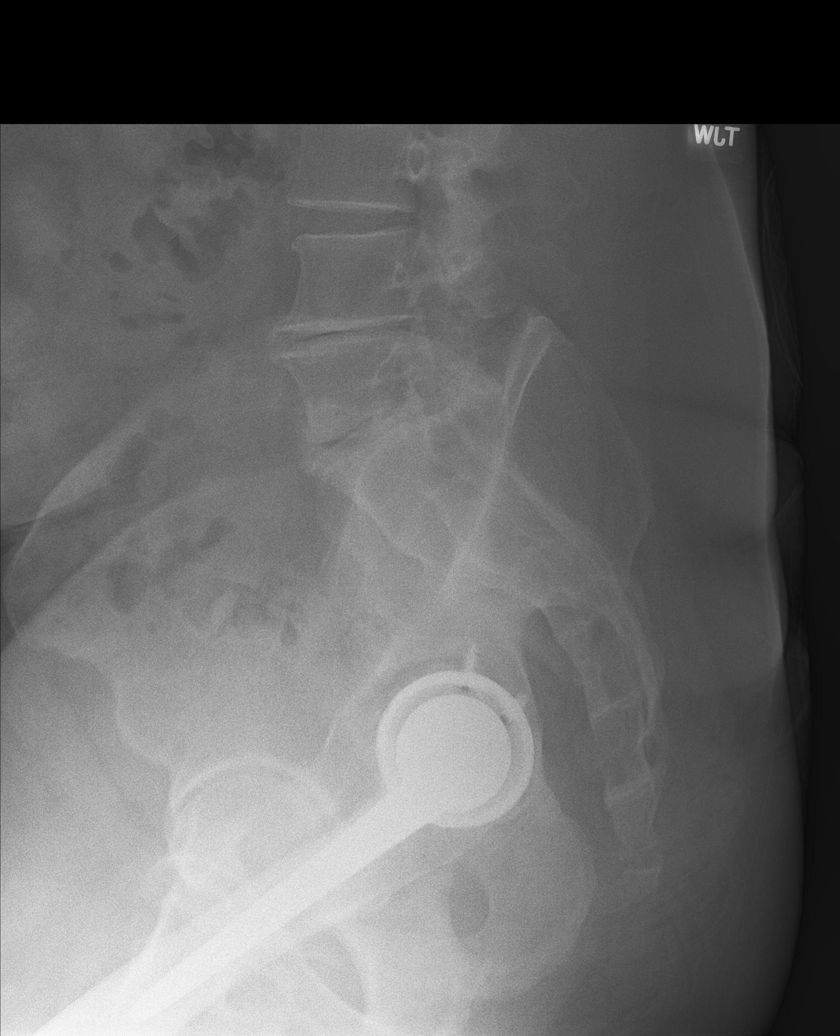

[5 of 5 positions shown; findings below may reference images not displayed]

FINDINGS: There is no evidence of lumbar spine fracture. Alignment is normal.
There is narrowing of the L4-5 and L5-S1 disc spaces with vacuum
phenomena. Degenerative facet arthritis primarily at L3-4.
IMPRESSION: No acute abnormalities. Degenerative disc disease at L4-5 and L5-S1.
Degenerative facet arthritis at L3-4.

## 2019-04-26 ENCOUNTER — Other Ambulatory Visit (INDEPENDENT_AMBULATORY_CARE_PROVIDER_SITE_OTHER): Payer: Medicare Other

## 2019-04-26 ENCOUNTER — Other Ambulatory Visit: Payer: Self-pay | Admitting: Family Medicine

## 2019-04-26 ENCOUNTER — Ambulatory Visit (INDEPENDENT_AMBULATORY_CARE_PROVIDER_SITE_OTHER): Payer: Medicare Other

## 2019-04-26 ENCOUNTER — Other Ambulatory Visit: Payer: Self-pay

## 2019-04-26 VITALS — Wt 234.0 lb

## 2019-04-26 DIAGNOSIS — E1165 Type 2 diabetes mellitus with hyperglycemia: Secondary | ICD-10-CM | POA: Diagnosis not present

## 2019-04-26 DIAGNOSIS — D509 Iron deficiency anemia, unspecified: Secondary | ICD-10-CM | POA: Diagnosis not present

## 2019-04-26 DIAGNOSIS — Z0000 Encounter for general adult medical examination without abnormal findings: Secondary | ICD-10-CM | POA: Diagnosis not present

## 2019-04-26 LAB — CBC WITH DIFFERENTIAL/PLATELET
Basophils Absolute: 0 10*3/uL (ref 0.0–0.1)
Basophils Relative: 0.4 % (ref 0.0–3.0)
Eosinophils Absolute: 0.1 10*3/uL (ref 0.0–0.7)
Eosinophils Relative: 1.7 % (ref 0.0–5.0)
HCT: 40.1 % (ref 39.0–52.0)
Hemoglobin: 13.7 g/dL (ref 13.0–17.0)
Lymphocytes Relative: 40.9 % (ref 12.0–46.0)
Lymphs Abs: 2.5 10*3/uL (ref 0.7–4.0)
MCHC: 34.1 g/dL (ref 30.0–36.0)
MCV: 91.8 fl (ref 78.0–100.0)
Monocytes Absolute: 0.5 10*3/uL (ref 0.1–1.0)
Monocytes Relative: 7.6 % (ref 3.0–12.0)
Neutro Abs: 3 10*3/uL (ref 1.4–7.7)
Neutrophils Relative %: 49.4 % (ref 43.0–77.0)
Platelets: 218 10*3/uL (ref 150.0–400.0)
RBC: 4.37 Mil/uL (ref 4.22–5.81)
RDW: 12.9 % (ref 11.5–15.5)
WBC: 6.1 10*3/uL (ref 4.0–10.5)

## 2019-04-26 LAB — COMPREHENSIVE METABOLIC PANEL
ALT: 21 U/L (ref 0–53)
AST: 17 U/L (ref 0–37)
Albumin: 4.2 g/dL (ref 3.5–5.2)
Alkaline Phosphatase: 66 U/L (ref 39–117)
BUN: 25 mg/dL — ABNORMAL HIGH (ref 6–23)
CO2: 27 mEq/L (ref 19–32)
Calcium: 9 mg/dL (ref 8.4–10.5)
Chloride: 104 mEq/L (ref 96–112)
Creatinine, Ser: 1.03 mg/dL (ref 0.40–1.50)
GFR: 71.96 mL/min (ref >60.00)
Glucose, Bld: 156 mg/dL — ABNORMAL HIGH (ref 70–99)
Potassium: 4.8 mEq/L (ref 3.5–5.1)
Sodium: 140 mEq/L (ref 135–145)
Total Bilirubin: 0.5 mg/dL (ref 0.2–1.2)
Total Protein: 6.5 g/dL (ref 6.0–8.3)

## 2019-04-26 LAB — LIPID PANEL
Cholesterol: 246 mg/dL — ABNORMAL HIGH (ref 0–200)
HDL: 43.4 mg/dL (ref >39.00)
LDL Cholesterol: 169 mg/dL — ABNORMAL HIGH (ref 0–99)
NonHDL: 203.03
Total CHOL/HDL Ratio: 6
Triglycerides: 172 mg/dL — ABNORMAL HIGH (ref 0.0–149.0)
VLDL: 34.4 mg/dL (ref 0.0–40.0)

## 2019-04-26 LAB — IRON: Iron: 87 ug/dL (ref 42–165)

## 2019-04-26 LAB — HEMOGLOBIN A1C: Hgb A1c MFr Bld: 7 % — ABNORMAL HIGH (ref 4.6–6.5)

## 2019-04-26 NOTE — Progress Notes (Signed)
Subjective:   Tim Mccullough is a 68 y.o. male who presents for Medicare Annual/Subsequent preventive examination.  Review of Systems:  N/A Cardiac Risk Factors include: advanced age (>56men, >33 women);male gender;diabetes mellitus;dyslipidemia;obesity (BMI >30kg/m2);hypertension     Objective:    Vitals: Wt 234 lb (106.1 kg) Comment: patient supplied  BMI 33.58 kg/m   Body mass index is 33.58 kg/m.  Advanced Directives 04/26/2019 04/08/2018 02/19/2017 02/26/2016 02/12/2016 06/02/2012 06/01/2012  Does Patient Have a Medical Advance Directive? No No No No No Patient does not have advance directive -  Would patient like information on creating a medical advance directive? No - Patient declined Yes (MAU/Ambulatory/Procedural Areas - Information given) - - - - -  Pre-existing out of facility DNR order (yellow form or pink MOST form) - - - - - No No    Tobacco Social History   Tobacco Use  Smoking Status Former Smoker  . Years: 20.00  . Types: Cigarettes  . Quit date: 11/09/1998  . Years since quitting: 20.4  Smokeless Tobacco Former Systems developer  . Quit date: 06/03/2007  Tobacco Comment   no alcohol since nov 12     Counseling given: No Comment: no alcohol since nov 12   Clinical Intake:  Pre-visit preparation completed: Yes  Pain : 0-10 Pain Score: 5  Pain Location: Generalized     Nutritional Status: BMI > 30  Obese Nutritional Risks: None Diabetes: Yes CBG done?: No Did pt. bring in CBG monitor from home?: No  How often do you need to have someone help you when you read instructions, pamphlets, or other written materials from your doctor or pharmacy?: 1 - Never  Interpreter Needed?: No  Comments: pt is divorced and lives independently Information entered by :: LPinson, RN  Past Medical History:  Diagnosis Date  . Abnormal echocardiogram 2/11   Repeated after med mgmt of cardiomyopathy showed EF 40-45%, mildly dilated LV, mild LV hypertrophy, anterolateral and apical  hypokinesis, mild diastolic dysfunction.  . Anemia    hx  . Arthritis   . Blood transfusion without reported diagnosis 09/2012  . BPH (benign prostatic hypertrophy) 2004  . Cancer ([facility])    melanoma back  . Cardiomyopathy    ? tachycardia-mediated  . Depression   . Diabetes mellitus 11/2006   Type II  . Diverticulosis   . Echocardiogram abnormal 7/10   Mild LVH with a mildly dilated LV.  EF was difficult to assess given poor acoustic windows but appeared moderately decreased.  Mild MR.  Probable small perimembranous VSD, mild LAE  . Esophageal erosions   . GERD (gastroesophageal reflux disease)   . Hip pain   . History of MRI 7/10   Cardiac MRI was done to followup difficult echo.  This showed mild to moderately dilated LV, mild to moderate LAE, EF  33% with global hypokinesis, normal RV size with mild systolic dysfunction.  There was no large VSD evident.  There was no myocardial delayed enhancement so no evidence for infiltrative disease or infarction.  LHC (7/10) showed only luminal irreg. in the coronaries and EF 35%  . Holter monitor, abnormal 7/10   Frequent PAC's and PVC's  Average HR 96  (but he had started Toprol XL).  HR range 71-130  . Hyperlipidemia 04/1998  . Hypertension 1995  . Obesity   . OSA (obstructive sleep apnea)    Not using CPAP- improved with weight loss  . Pneumonia    HX OF pna  . Sinus tachycardia  Pt says he has been told his heart rate is high since he was in high school.   . Sleep apnea    has cpap; but does not use  . VSD (ventricular septal defect)    Probable small perimembranous   Past Surgical History:  Procedure Laterality Date  . CARDIAC CATHETERIZATION  05/30/2009   Dilated cardiomyopathy.  min VSD.  Hypokin Inf  wall.  Multiple min luminal Irr.  EF 35%  . COLONOSCOPY W/ BIOPSIES  08/24/2006   Divertics, polyps x3, bx negative, repeat 2012 with mild diverticulosis in teh sigmoid colon and 1 polyp in tranverse colon, repeat due 2017  .  ESOPHAGOGASTRODUODENOSCOPY  2012   1)Barrett's esoph, 2) Erosive esophagitis, 3) Mild gastritis in the antrum, 4) Duodenitis in the bulb of duodenum, 5) Small hiatal hernia  . INGUINAL HERNIA REPAIR  1985   Left  . INGUINAL HERNIA REPAIR  1999   Right  . KNEE ARTHROSCOPY  10/15/2011   Procedure: ARTHROSCOPY KNEE;  Surgeon: Ninetta Lights, MD;  Location: [facility];  Service: Orthopedics;  Laterality: Right;  right knee scope with lateral meniscectomy, removal loose foreign body, and microfracture technique  . LAMINECTOMY  1987   with discectomy  . ROTATOR CUFF REPAIR  2/11   rt  . TOTAL HIP ARTHROPLASTY  2011   Left  . TOTAL KNEE ARTHROPLASTY  06/01/2012   Procedure: TOTAL KNEE ARTHROPLASTY;  Surgeon: Ninetta Lights, MD;  Location: [facility];  Service: Orthopedics;  Laterality: Right;   Family History  Problem Relation Age of Onset  . Heart disease Father        CABG  . Stroke Father   . Diabetes Father   . Hypertension Mother   . Alzheimer's disease Mother   . Diabetes Sister   . Hypertension Sister   . Cancer Sister        uterine cancer  . Diabetes Brother   . Throat cancer Brother        Throat  . Diabetes Sister   . Hypertension Sister   . Throat cancer Other        Throat  . Diabetes Other   . Alcohol abuse Other   . Diabetes Maternal Grandmother        Insulin  . Colon cancer Cousin   . Drug abuse Neg Hx   . Prostate cancer Neg Hx    Social History   Socioeconomic History  . Marital status: Single    Spouse name: Not on file  . Number of children: Not on file  . Years of education: Not on file  . Highest education level: Not on file  Occupational History  . 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 Occupation: [employer]  . Financial resource strain: Not on file  . Food insecurity    Worry: Not on file    Inability: Not on file  . Transportation needs    Medical: Not on file    Non-medical:  Not on file  Tobacco Use  . Smoking status: Former Smoker    Years: 20.00    Types: Cigarettes    Quit date: 11/09/1998    Years since quitting: 20.4  . Smokeless tobacco: Former Systems developer    Quit date: 06/03/2007  . Tobacco comment: no alcohol since nov 12  Substance and Sexual Activity  . Alcohol use: No    Alcohol/week: 0.0 standard drinks    Comment:  sober as 09/2011  . Drug use: No  . Sexual activity: Not Currently  Lifestyle  . Physical activity    Days per week: Not on file    Minutes per session: Not on file  . Stress: Not on file  Relationships  . Social Herbalist on phone: Not on file    Gets together: Not on file    Attends religious service: Not on file    Active member of club or organization: Not on file    Attends meetings of clubs or organizations: Not on file    Relationship status: Not on file  Other Topics Concern  . Not on file  Social History Narrative   Lives in [place], lives alone, 3 kids (2 sons, 1 daughter)   Divorced    Outpatient Encounter Medications as of 04/26/2019  Medication Sig  . aspirin 81 MG tablet Take 81 mg by mouth 2 (two) times daily.  Marland Kitchen atorvastatin (LIPITOR) 40 MG tablet TAKE 1 TABLET BY MOUTH EVERY DAY  . carvedilol (COREG) 12.5 MG tablet Take 1 tablet (12.5 mg total) by mouth 2 (two) times daily with a meal.  . doxazosin (CARDURA) 1 MG tablet TAKE 1 TABLET (1 MG TOTAL) BY MOUTH AT BEDTIME.  . fish oil-omega-3 fatty acids 1000 MG capsule Take 1 g by mouth 2 (two) times daily.   Marland Kitchen glimepiride (AMARYL) 1 MG tablet 2 tabs in the AM, 1 in the PM  . Lancets (ONETOUCH ULTRASOFT) lancets USE DAILY AS NEEDED TO CHECK SUGAR, DX 250.00  . losartan (COZAAR) 25 MG tablet Take 1 tablet (25 mg total) by mouth daily.  . meloxicam (MOBIC) 15 MG tablet TAKE 1 TABLET BY MOUTH EVERY DAY  . metFORMIN (GLUCOPHAGE) 500 MG tablet Take 2 tablets (1,000 mg total) by mouth 2 (two) times daily with a meal.  . nystatin (MYCOSTATIN/NYSTOP) powder Apply  topically 2 (two) times daily as needed.  Marland Kitchen omeprazole (PRILOSEC) 20 MG capsule TAKE 1 CAPSULE (20 MG TOTAL) BY MOUTH DAILY.  . ONE TOUCH ULTRA TEST test strip CHECK BLOOD SUGAR DAILY AND IF BLOOD SUGAR FLUCTUATES. 250.00  . PARoxetine (PAXIL) 20 MG tablet TAKE 1 TABLET BY MOUTH EVERY DAY   No facility-administered encounter medications on file as of 04/26/2019.     Activities of Daily Living In your present state of health, do you have any difficulty performing the following activities: 04/26/2019  Hearing? N  Vision? N  Difficulty concentrating or making decisions? N  Walking or climbing stairs? N  Dressing or bathing? N  Doing errands, shopping? N  Preparing Food and eating ? N  Using the Toilet? N  In the past six months, have you accidently leaked urine? N  Do you have problems with loss of bowel control? N  Managing your Medications? N  Managing your Finances? N  Housekeeping or managing your Housekeeping? N  Some recent data might be hidden    Patient Care Team: Tonia Ghent, MD as PCP - General (Family Medicine)   Assessment:   This is a routine wellness examination for [place].   Hearing Screening   125Hz  250Hz  500Hz  1000Hz  2000Hz  3000Hz  4000Hz  6000Hz  8000Hz   Right ear:           Left ear:           Vision Screening Comments: Vision exam in 2019 with Dr. Alain Honey   Exercise Activities and Dietary recommendations Exercise limited by: None identified  Goals    .  Patient Stated     Starting 04/26/19, I will continue to take medications as prescribed.         Fall Risk Fall Risk  04/26/2019 04/08/2018 08/20/2015  Falls in the past year? 0 No No   Depression Screen PHQ 2/9 Scores 04/26/2019 04/08/2018 08/20/2015  PHQ - 2 Score 0 0 0  PHQ- 9 Score 0 0 -    Cognitive Function MMSE - Mini Mental State Exam 04/26/2019 04/08/2018  Orientation to time 5 5  Orientation to Place 5 5  Registration 3 3  Attention/ Calculation 0 0  Recall 3 1  Recall-comments  - unable to recall 2 of 3 words  Language- name 2 objects 0 0  Language- repeat 1 1  Language- follow 3 step command 0 3  Language- read & follow direction 0 0  Write a sentence 0 0  Copy design 0 0  Total score 17 18       PLEASE NOTE: A Mini-Cog screen was completed. Maximum score is 17. A value of 0 denotes this part of Folstein MMSE was not completed or the patient failed this part of the Mini-Cog screening.   Mini-Cog Screening Orientation to Time - Max 5 pts Orientation to Place - Max 5 pts Registration - Max 3 pts Recall - Max 3 pts Language Repeat - Max 1 pts      Immunization History  Administered Date(s) Administered  . Influenza Whole 08/28/1998, 08/09/2010  . Influenza,inj,Quad PF,6+ Mos 07/28/2013, 08/17/2014, 08/20/2015, 08/24/2016, 08/23/2017, 07/18/2018  . Pneumococcal Conjugate-13 08/23/2017  . Pneumococcal Polysaccharide-23 06/03/2012, 10/20/2018  . Pneumococcal-Unspecified 06/01/2012  . Td 11/09/1997  . Tdap 08/07/2011, 04/08/2013    Screening Tests Health Maintenance  Topic Date Due  . INFLUENZA VACCINE  06/10/2019  . FOOT EXAM  07/19/2019  . HEMOGLOBIN A1C  07/23/2019  . OPHTHALMOLOGY EXAM  08/12/2019  . TETANUS/TDAP  04/09/2023  . COLONOSCOPY  02/25/2026  . Hepatitis C Screening  Completed  . PNA vac Low Risk Adult  Completed     Plan:     I have personally reviewed, addressed, and noted the following in the patient's chart:  A. Medical and social history B. Use of alcohol, tobacco or illicit drugs  C. Current medications and supplements D. Functional ability and status E.  Nutritional status F.  Physical activity G. Advance directives H. List of other physicians I.  Hospitalizations, surgeries, and ER visits in previous 12 months J.  Vitals (unless it is a telemedicine encounter) K. Screenings to include cognitive, depression, hearing, vision (NOTE: hearing and vision screenings not completed in telemedicine encounter) L. Referrals and  appointments   In addition, I have reviewed and discussed with patient certain preventive protocols, quality metrics, and best practice recommendations. A written personalized care plan for preventive services and recommendations were provided to patient.  With patient's permission, we connected on 04/26/19 at  8:00 AM EDT. Interactive audio and video telecommunications were attempted with patient. This attempt was unsuccessful due to patient having technical difficulties OR patient did not have access to video capability.  Encounter was completed with audio only.  Two patient identifiers were used to ensure the encounter occurred with the correct person. Patient was in home and writer was in office.   Signed,   Lindell Noe, MHA, BS, RN Health Coach

## 2019-04-26 NOTE — Patient Instructions (Signed)
[name] , Thank you for taking time to come for your Medicare Wellness Visit. I appreciate your ongoing commitment to your health goals. Please review the following plan we discussed and let me know if I can assist you in the future.   These are the goals we discussed: Goals    . Patient Stated     Starting 04/26/19, I will continue to take medications as prescribed.         This is a list of the screening recommended for you and due dates:  Health Maintenance  Topic Date Due  . Flu Shot  06/10/2019  . Complete foot exam   07/19/2019  . Hemoglobin A1C  07/23/2019  . Eye exam for diabetics  08/12/2019  . Tetanus Vaccine  04/09/2023  . Colon Cancer Screening  02/25/2026  .  Hepatitis C: One time screening is recommended by Center for Disease Control  (CDC) for  adults born from [birth year] through 1964.   Completed  . Pneumonia vaccines  Completed   Preventive Care for Adults  A healthy lifestyle and preventive care can promote health and wellness. Preventive health guidelines for adults include the following key practices.  . A routine yearly physical is a good way to check with your health care provider about your health and preventive screening. It is a chance to share any concerns and updates on your health and to receive a thorough exam.  . Visit your dentist for a routine exam and preventive care every 6 months. Brush your teeth twice a day and floss once a day. Good oral hygiene prevents tooth decay and gum disease.  . The frequency of eye exams is based on your age, health, family medical history, use  of contact lenses, and other factors. Follow your health care provider's recommendations for frequency of eye exams.  . Eat a healthy diet. Foods like vegetables, fruits, whole grains, low-fat dairy products, and lean protein foods contain the nutrients you need without too many calories. Decrease your intake of foods high in solid fats, added sugars, and salt. Eat the right  amount of calories for you. Get information about a proper diet from your health care provider, if necessary.  . Regular physical exercise is one of the most important things you can do for your health. Most adults should get at least 150 minutes of moderate-intensity exercise (any activity that increases your heart rate and causes you to sweat) each week. In addition, most adults need muscle-strengthening exercises on 2 or more days a week.  Silver Sneakers may be a benefit available to you. To determine eligibility, you may visit the website: www.silversneakers.com or contact program at (629)840-2333 Mon-Fri between 8AM-8PM.   . Maintain a healthy weight. The body mass index (BMI) is a screening tool to identify possible weight problems. It provides an estimate of body fat based on height and weight. Your health care provider can find your BMI and can help you achieve or maintain a healthy weight.   For adults 20 years and older: ? A BMI below 18.5 is considered underweight. ? A BMI of 18.5 to 24.9 is normal. ? A BMI of 25 to 29.9 is considered overweight. ? A BMI of 30 and above is considered obese.   . Maintain normal blood lipids and cholesterol levels by exercising and minimizing your intake of saturated fat. Eat a balanced diet with plenty of fruit and vegetables. Blood tests for lipids and cholesterol should begin at age 67 and be  repeated every 5 years. If your lipid or cholesterol levels are high, you are over 50, or you are at high risk for heart disease, you may need your cholesterol levels checked more frequently. Ongoing high lipid and cholesterol levels should be treated with medicines if diet and exercise are not working.  . If you smoke, find out from your health care provider how to quit. If you do not use tobacco, please do not start.  . If you choose to drink alcohol, please do not consume more than 2 drinks per day. One drink is considered to be 12 ounces (355 mL) of beer, 5  ounces (148 mL) of wine, or 1.5 ounces (44 mL) of liquor.  . If you are 51-8 years old, ask your health care provider if you should take aspirin to prevent strokes.  . Use sunscreen. Apply sunscreen liberally and repeatedly throughout the day. You should seek shade when your shadow is shorter than you. Protect yourself by wearing long sleeves, pants, a wide-brimmed hat, and sunglasses year round, whenever you are outdoors.  . Once a month, do a whole body skin exam, using a mirror to look at the skin on your back. Tell your health care provider of new moles, moles that have irregular borders, moles that are larger than a pencil eraser, or moles that have changed in shape or color.

## 2019-04-26 NOTE — Progress Notes (Signed)
PCP notes:   Health maintenance:  No gaps identified  Abnormal screenings:   None  Patient concerns:   None  Nurse concerns:  None  Next PCP appt:   04/28/19 @ 1045

## 2019-04-28 ENCOUNTER — Ambulatory Visit (INDEPENDENT_AMBULATORY_CARE_PROVIDER_SITE_OTHER): Payer: Medicare Other | Admitting: Family Medicine

## 2019-04-28 ENCOUNTER — Encounter: Payer: Self-pay | Admitting: Family Medicine

## 2019-04-28 VITALS — Wt 235.0 lb

## 2019-04-28 DIAGNOSIS — R001 Bradycardia, unspecified: Secondary | ICD-10-CM | POA: Diagnosis not present

## 2019-04-28 DIAGNOSIS — E1169 Type 2 diabetes mellitus with other specified complication: Secondary | ICD-10-CM

## 2019-04-28 DIAGNOSIS — E785 Hyperlipidemia, unspecified: Secondary | ICD-10-CM | POA: Diagnosis not present

## 2019-04-28 DIAGNOSIS — E1165 Type 2 diabetes mellitus with hyperglycemia: Secondary | ICD-10-CM

## 2019-04-28 DIAGNOSIS — E119 Type 2 diabetes mellitus without complications: Secondary | ICD-10-CM

## 2019-04-28 DIAGNOSIS — R439 Unspecified disturbances of smell and taste: Secondary | ICD-10-CM | POA: Diagnosis not present

## 2019-04-28 DIAGNOSIS — Z7189 Other specified counseling: Secondary | ICD-10-CM

## 2019-04-28 DIAGNOSIS — F341 Dysthymic disorder: Secondary | ICD-10-CM | POA: Diagnosis not present

## 2019-04-28 DIAGNOSIS — Z0000 Encounter for general adult medical examination without abnormal findings: Secondary | ICD-10-CM

## 2019-04-28 MED ORDER — DOXAZOSIN MESYLATE 1 MG PO TABS: ORAL_TABLET | ORAL | 3 refills | Status: DC

## 2019-04-28 MED ORDER — CARVEDILOL 12.5 MG PO TABS: 6.2500 mg | ORAL_TABLET | Freq: Two times a day (BID) | ORAL | Status: DC

## 2019-04-28 MED ORDER — PAROXETINE HCL 20 MG PO TABS: 20.0000 mg | ORAL_TABLET | Freq: Every day | ORAL | 3 refills | Status: DC

## 2019-04-28 MED ORDER — GLIMEPIRIDE 1 MG PO TABS: ORAL_TABLET | ORAL | 3 refills | Status: DC

## 2019-04-28 NOTE — Progress Notes (Signed)
Interactive audio and video telecommunications were attempted between this provider and patient, however failed, due to patient having technical difficulties OR patient did not have access to video capability.  We continued and completed visit with audio only.   Virtual Visit via Telephone Note  I connected with patient on 04/28/19  at 11:15 AM  by telephone and verified that I am speaking with the correct person using two identifiers.  Location of patient: home  Location of MD: [place] Name of referring provider (if blank then none associated): Names per persons and role in encounter:  MD: Earlyne Iba, Patient: name listed above.    I discussed the limitations, risks, security and privacy concerns of performing an evaluation and management service by telephone and the availability of in person appointments. I also discussed with the patient that there may be a patient responsible charge related to this service. The patient expressed understanding and agreed to proceed.  CC: f/u  HPI:  Diabetes:  Using medications without difficulties:yes Hypoglycemic episodes:no Hyperglycemic episodes:no Feet problems:no Blood Sugars averaging: usually ~150-160 eye exam within last year: yes Labs d/w pt.  A1c 7.  Needs f/u labs before OV in 3-4 months, we will set up.   He has some B hand paresthesia w/o foot sx, d/w pt about trying a night splint.  He'll update me as needed.    Mood d/w pt.  No ADE on med.  Mood is good, no SI/HI.   Compliant with medication.  Elevated Cholesterol: He didn't have trouble with atorvastatin but was accidentally off med, with lipid increase noted.   Muscle aches: not from statin.   Diet compliance:yes Exercise:yes D/w pt about restart statin in the meantime.  He will update me as needed.     CBC wnl.  D/w pt.    Bradycardia.  Often with lower pulse noted, 50 or lower.  Fatigue noted.  He is taking coreg 12.5mg  BID.  He'll cut back to 6.25mg  and  update me next week.    Taste change since last year.  Noted since surgery last year.  He doesn't have a lot of rhinorrhea or GERD sx. Still on PPI.  He is using afrin 3 times a week for nasal congestion. D/w pt about trying to taper afrin, we can refer to ENT later if needed.  He agrees.  Shingles shot d/w pt.  out of stock.  Vaccines d/w pt.  Advance directive- daughter designated if patient were incapacitated.  Prostate cancer screening and PSA options (with potential risks and benefits of testing vs not testing) were discussed along with recent recs/guidelines.  He declined testing PSA at this point. Colonoscopy 2017  His son is moving to [place].  Discussed.  This will limit his contact with his grandchildren, with whom he is close.   Observations/Objective: nad Speech wnl  Assessment and Plan: Diabetes:  Labs d/w pt.  A1c 7.  Needs f/u labs before OV in 3-4 months, we will set up.  No change in meds at this point.  He has some B hand paresthesia w/o foot sx, d/w pt about trying a night splint.  He'll update me as needed.    Mood d/w pt.  No ADE on med.  Mood is good, no SI/HI.   Compliant with medication.  Elevated Cholesterol: He didn't have trouble with atorvastatin but was accidentally off med, with lipid increase noted.   D/w pt about restart statin in the meantime.  He will update me as  needed.     CBC wnl.  D/w pt. no intervention needed.  Bradycardia.  Often with lower pulse noted, 50 or lower.  Fatigue noted.  He is taking coreg 12.5mg  BID.  He'll cut back to 6.25mg  and update me next week.    Taste change since last year.  Noted since surgery last year.  He doesn't have a lot of rhinorrhea or GERD sx. Still on PPI.  He is using afrin 3 times a week for nasal congestion. D/w pt about trying to taper afrin, we can refer to ENT later if needed.  He agrees.  Will await update from patient.   Shingles shot d/w pt.  out of stock.  Vaccines d/w pt.  Advance directive-  daughter designated if patient were incapacitated.  Prostate cancer screening and PSA options (with potential risks and benefits of testing vs not testing) were discussed along with recent recs/guidelines.  He declined testing PSA at this point. Colonoscopy 2017  His son is moving to [place].  Discussed.  This will limit his contact with his grandchildren, with whom he is close.  Follow Up Instructions: See above  I discussed the assessment and treatment plan with the patient. The patient was provided an opportunity to ask questions and all were answered. The patient agreed with the plan and demonstrated an understanding of the instructions.   The patient was advised to call back or seek an in-person evaluation if the symptoms worsen or if the condition fails to improve as anticipated.  I provided 30 minutes of non-face-to-face time during this encounter.  Elsie Stain, MD

## 2019-04-30 DIAGNOSIS — R439 Unspecified disturbances of smell and taste: Secondary | ICD-10-CM | POA: Insufficient documentation

## 2019-04-30 DIAGNOSIS — R001 Bradycardia, unspecified: Secondary | ICD-10-CM | POA: Insufficient documentation

## 2019-04-30 NOTE — Assessment & Plan Note (Signed)
Often with lower pulse noted, 50 or lower.  Fatigue noted.  He is taking coreg 12.5mg  BID.  He'll cut back to 6.25mg  and update me next week.

## 2019-04-30 NOTE — Assessment & Plan Note (Signed)
He didn't have trouble with atorvastatin but was accidentally off med, with lipid increase noted.   D/w pt about restart statin in the meantime.  He will update me as needed.

## 2019-04-30 NOTE — Assessment & Plan Note (Signed)
Advance directive- daughter designated if patient were incapacitated.   

## 2019-04-30 NOTE — Assessment & Plan Note (Signed)
Shingles shot d/w pt.  out of stock.  Vaccines d/w pt.  Advance directive- daughter designated if patient were incapacitated.  Prostate cancer screening and PSA options (with potential risks and benefits of testing vs not testing) were discussed along with recent recs/guidelines.  He declined testing PSA at this point. Colonoscopy 2017

## 2019-04-30 NOTE — Assessment & Plan Note (Signed)
Taste change since last year.  Noted since surgery last year.  He doesn't have a lot of rhinorrhea or GERD sx. Still on PPI.  He is using afrin 3 times a week for nasal congestion. D/w pt about trying to taper afrin, we can refer to ENT later if needed.  He agrees.  Will await update from patient.

## 2019-04-30 NOTE — Assessment & Plan Note (Signed)
Labs d/w pt.  A1c 7.  Needs f/u labs before OV in 3-4 months, we will set up.  No change in meds at this point.

## 2019-04-30 NOTE — Assessment & Plan Note (Signed)
Mood d/w pt.  No ADE on med.  Mood is good, no SI/HI.   Compliant with medication.

## 2019-05-01 ENCOUNTER — Encounter: Payer: Self-pay | Admitting: Family Medicine

## 2019-05-08 NOTE — Progress Notes (Signed)
I reviewed health advisor's note, was available for consultation, and agree with documentation and plan.  

## 2019-07-21 ENCOUNTER — Other Ambulatory Visit: Payer: Self-pay | Admitting: Family Medicine

## 2019-07-24 ENCOUNTER — Other Ambulatory Visit: Payer: Self-pay | Admitting: *Deleted

## 2019-07-27 ENCOUNTER — Other Ambulatory Visit: Payer: Self-pay | Admitting: Family Medicine

## 2019-08-21 ENCOUNTER — Other Ambulatory Visit: Payer: Self-pay

## 2019-08-21 ENCOUNTER — Other Ambulatory Visit (INDEPENDENT_AMBULATORY_CARE_PROVIDER_SITE_OTHER): Payer: Medicare Other

## 2019-08-21 DIAGNOSIS — E1165 Type 2 diabetes mellitus with hyperglycemia: Secondary | ICD-10-CM | POA: Diagnosis not present

## 2019-08-21 LAB — LIPID PANEL
Cholesterol: 142 mg/dL (ref 0–200)
HDL: 38.7 mg/dL — ABNORMAL LOW (ref >39.00)
LDL Cholesterol: 76 mg/dL (ref 0–99)
NonHDL: 103.2
Total CHOL/HDL Ratio: 4
Triglycerides: 137 mg/dL (ref 0.0–149.0)
VLDL: 27.4 mg/dL (ref 0.0–40.0)

## 2019-08-21 LAB — HEMOGLOBIN A1C: Hgb A1c MFr Bld: 7 % — ABNORMAL HIGH (ref 4.6–6.5)

## 2019-08-22 ENCOUNTER — Encounter: Payer: Self-pay | Admitting: Gastroenterology

## 2019-08-28 ENCOUNTER — Other Ambulatory Visit: Payer: Self-pay

## 2019-08-28 ENCOUNTER — Encounter: Payer: Self-pay | Admitting: Family Medicine

## 2019-08-28 ENCOUNTER — Ambulatory Visit (INDEPENDENT_AMBULATORY_CARE_PROVIDER_SITE_OTHER): Payer: Medicare Other | Admitting: Family Medicine

## 2019-08-28 VITALS — BP 126/68 | HR 91 | Temp 97.2°F | Ht 70.0 in | Wt 239.6 lb

## 2019-08-28 DIAGNOSIS — E785 Hyperlipidemia, unspecified: Secondary | ICD-10-CM

## 2019-08-28 DIAGNOSIS — Z23 Encounter for immunization: Secondary | ICD-10-CM | POA: Diagnosis not present

## 2019-08-28 DIAGNOSIS — E119 Type 2 diabetes mellitus without complications: Secondary | ICD-10-CM

## 2019-08-28 DIAGNOSIS — I10 Essential (primary) hypertension: Secondary | ICD-10-CM

## 2019-08-28 DIAGNOSIS — E1169 Type 2 diabetes mellitus with other specified complication: Secondary | ICD-10-CM

## 2019-08-28 MED ORDER — LOSARTAN POTASSIUM 25 MG PO TABS: 25.0000 mg | ORAL_TABLET | Freq: Every day | ORAL | 3 refills | Status: DC

## 2019-08-28 MED ORDER — ATORVASTATIN CALCIUM 40 MG PO TABS: ORAL_TABLET | ORAL | 3 refills | Status: DC

## 2019-08-28 NOTE — Assessment & Plan Note (Signed)
BP is reasonable and pulse is not low on 12.5mg  carvedilol BID.  D/w pt. Would continue as is.

## 2019-08-28 NOTE — Assessment & Plan Note (Signed)
  A1c controlled, similar to prev, d/w pt at OV.   Recheck periodically.  He'll update me as needed.  See avs, cautions d/w pt.  He agrees.

## 2019-08-28 NOTE — Progress Notes (Signed)
Diabetes:  Using medications without difficulties: yes Hypoglycemic episodes: not unless he skips a meal or has a really light meal. Cautions d/w pt.  He doesn't have sx o/w.   Hyperglycemic episodes:no Feet problems:no Blood Sugars averaging: usually ~130s eye exam within last year: due, d/w pt.  A1c controlled, similar to prev, d/w pt at [facility].    Lipids clearly improved, d/w pt at [facility].  No ADE on lipitor.    His son moved to [place], but they will be able to come back some around [place].    BP is reasonable and pulse is not low on 12.5mg  carvedilol BID.  D/w pt.   Meds, vitals, and allergies reviewed.   ROS: Per HPI unless specifically indicated in ROS section   GEN: nad, alert and oriented HEENT: ncat NECK: supple w/o LA CV: rrr. PULM: ctab, no inc wob ABD: soft, +bs EXT: no edema SKIN: well perfused.   Diabetic foot exam: Normal inspection No skin breakdown No calluses  Normal DP pulses Normal sensation to light touch and monofilament Nails normal

## 2019-08-28 NOTE — Assessment & Plan Note (Signed)
Lipids clearly improved, d/w pt at [facility].  No ADE on lipitor.  Would continue as is.  He agrees.

## 2019-08-28 NOTE — Patient Instructions (Addendum)
If you have any troubles with low sugars, then skip the nighttime glimepiride.   Flu shot today.  Plan on recheck 3 months with A1c at the visit. You don't have to fast.  Update me as needed.  Take care.  Glad to see you.

## 2019-09-28 ENCOUNTER — Other Ambulatory Visit: Payer: Self-pay

## 2019-09-28 ENCOUNTER — Encounter: Payer: Self-pay | Admitting: Gastroenterology

## 2019-09-28 ENCOUNTER — Ambulatory Visit (INDEPENDENT_AMBULATORY_CARE_PROVIDER_SITE_OTHER): Payer: Medicare Other | Admitting: Gastroenterology

## 2019-09-28 VITALS — BP 120/80 | HR 60 | Temp 97.2°F | Ht 70.0 in | Wt 241.0 lb

## 2019-09-28 DIAGNOSIS — Z1159 Encounter for screening for other viral diseases: Secondary | ICD-10-CM | POA: Diagnosis not present

## 2019-09-28 DIAGNOSIS — K219 Gastro-esophageal reflux disease without esophagitis: Secondary | ICD-10-CM

## 2019-09-28 DIAGNOSIS — K2270 Barrett's esophagus without dysplasia: Secondary | ICD-10-CM

## 2019-09-28 MED ORDER — OMEPRAZOLE 20 MG PO CPDR: 20.0000 mg | DELAYED_RELEASE_CAPSULE | Freq: Every day | ORAL | 11 refills | Status: AC

## 2019-09-28 NOTE — Progress Notes (Signed)
History of Present Illness: This is a 68 year old male referred by Tim Ghent, MD for the evaluation of GERD with a history of long segment Barrett's esophagus without dysplasia.  His reflux symptoms are controlled on medications however when he misses doses he has reflux symptoms.  He has no gastrointestinal complaints. Denies weight loss, abdominal pain, constipation, diarrhea, change in stool caliber, melena, hematochezia, nausea, vomiting, dysphagia, chest pain.   EGD 02/2016 - Esophageal mucosal changes secondary to established long-segment Barrett's disease. Biopsied.  - Small hiatal hernia. - One small angioectasia in the duodenal bulb   No Known Allergies Outpatient Medications Prior to Visit  Medication Sig Dispense Refill  . aspirin 81 MG tablet Take 81 mg by mouth 2 (two) times daily.    Marland Kitchen atorvastatin (LIPITOR) 40 MG tablet TAKE 1 TABLET BY MOUTH EVERY DAY 90 tablet 3  . carvedilol (COREG) 12.5 MG tablet TAKE 1 TABLET BY MOUTH 2 TIMES DAILY WITH A MEAL. 180 tablet 3  . doxazosin (CARDURA) 1 MG tablet 1 tablet daily 90 tablet 3  . fish oil-omega-3 fatty acids 1000 MG capsule Take 1 g by mouth 2 (two) times daily.     Marland Kitchen glimepiride (AMARYL) 1 MG tablet 2 tabs in the AM, 1 in the PM 270 tablet 3  . Lancets (ONETOUCH ULTRASOFT) lancets USE DAILY AS NEEDED TO CHECK SUGAR, DX 250.00 100 each 2  . losartan (COZAAR) 25 MG tablet Take 1 tablet (25 mg total) by mouth daily. 90 tablet 3  . meloxicam (MOBIC) 15 MG tablet TAKE 1 TABLET BY MOUTH EVERY DAY 90 tablet 3  . metFORMIN (GLUCOPHAGE) 500 MG tablet Take 2 tablets (1,000 mg total) by mouth 2 (two) times daily with a meal. 360 tablet 3  . nystatin (MYCOSTATIN/NYSTOP) powder Apply topically 2 (two) times daily as needed. 30 g 3  . omeprazole (PRILOSEC) 20 MG capsule TAKE 1 CAPSULE (20 MG TOTAL) BY MOUTH DAILY. 30 capsule 0  . ONE TOUCH ULTRA TEST test strip CHECK BLOOD SUGAR DAILY AND IF BLOOD SUGAR FLUCTUATES. 250.00 100 each  11  . PARoxetine (PAXIL) 20 MG tablet Take 1 tablet (20 mg total) by mouth daily. 90 tablet 3   No facility-administered medications prior to visit.    Past Medical History:  Diagnosis Date  . Abnormal echocardiogram 2/11   Repeated after med mgmt of cardiomyopathy showed EF 40-45%, mildly dilated LV, mild LV hypertrophy, anterolateral and apical hypokinesis, mild diastolic dysfunction.  . Anemia    hx  . Arthritis   . Blood transfusion without reported diagnosis 09/2012  . BPH (benign prostatic hypertrophy) 2004  . Cancer ([place])    melanoma back  . Cardiomyopathy    ? tachycardia-mediated  . Depression   . Diabetes mellitus 11/2006   Type II  . Diverticulosis   . Echocardiogram abnormal 7/10   Mild LVH with a mildly dilated LV.  EF was difficult to assess given poor acoustic windows but appeared moderately decreased.  Mild MR.  Probable small perimembranous VSD, mild LAE  . Esophageal erosions   . GERD (gastroesophageal reflux disease)   . Hip pain   . History of MRI 7/10   Cardiac MRI was done to followup difficult echo.  This showed mild to moderately dilated LV, mild to moderate LAE, EF  33% with global hypokinesis, normal RV size with mild systolic dysfunction.  There was no large VSD evident.  There was no myocardial delayed enhancement so no evidence for infiltrative  disease or infarction.  LHC (7/10) showed only luminal irreg. in the coronaries and EF 35%  . Holter monitor, abnormal 7/10   Frequent PAC's and PVC's  Average HR 96  (but he had started Toprol XL).  HR range 71-130  . Hyperlipidemia 04/1998  . Hypertension 1995  . Obesity   . OSA (obstructive sleep apnea)    Not using CPAP- improved with weight loss  . Pneumonia    HX OF pna  . Sinus tachycardia    Pt says he has been told his heart rate is high since he was in high school.   . Sleep apnea    has cpap; but does not use  . VSD (ventricular septal defect)    Probable small perimembranous   Past Surgical  History:  Procedure Laterality Date  . CARDIAC CATHETERIZATION  05/30/2009   Dilated cardiomyopathy.  min VSD.  Hypokin Inf  wall.  Multiple min luminal Irr.  EF 35%  . COLONOSCOPY W/ BIOPSIES  08/24/2006   Divertics, polyps x3, bx negative, repeat 2012 with mild diverticulosis in teh sigmoid colon and 1 polyp in tranverse colon, repeat due 2017  . ESOPHAGOGASTRODUODENOSCOPY  2012   1)Barrett's esoph, 2) Erosive esophagitis, 3) Mild gastritis in the antrum, 4) Duodenitis in the bulb of duodenum, 5) Small hiatal hernia  . INGUINAL HERNIA REPAIR  1985   Left  . INGUINAL HERNIA REPAIR  1999   Right  . KNEE ARTHROSCOPY  10/15/2011   Procedure: ARTHROSCOPY KNEE;  Surgeon: Ninetta Lights, MD;  Location: [facility];  Service: Orthopedics;  Laterality: Right;  right knee scope with lateral meniscectomy, removal loose foreign body, and microfracture technique  . LAMINECTOMY  1987   with discectomy  . ROTATOR CUFF REPAIR  2/11   rt  . TOTAL HIP ARTHROPLASTY  2011   Left  . TOTAL KNEE ARTHROPLASTY  06/01/2012   Procedure: TOTAL KNEE ARTHROPLASTY;  Surgeon: Ninetta Lights, MD;  Location: [facility];  Service: Orthopedics;  Laterality: Right;   Social History   Socioeconomic History  . Marital status: Single    Spouse name: Not on file  . Number of children: 3  . Years of education: Not on file  . Highest education level: Not on file  Occupational History  . 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 Occupation: [employer]  . Financial resource strain: Not on file  . Food insecurity    Worry: Not on file    Inability: Not on file  . Transportation needs    Medical: Not on file    Non-medical: Not on file  Tobacco Use  . Smoking status: Former Smoker    Years: 20.00    Types: Cigarettes    Quit date: 11/09/1998    Years since quitting: 20.8  . Smokeless tobacco: Former Systems developer    Quit date: 06/03/2007  . Tobacco comment: no alcohol  since nov 12  Substance and Sexual Activity  . Alcohol use: No    Alcohol/week: 0.0 standard drinks    Comment: sober as 09/2011  . Drug use: No  . Sexual activity: Not Currently  Lifestyle  . Physical activity    Days per week: Not on file    Minutes per session: Not on file  . Stress: Not on file  Relationships  . Social Herbalist on phone: Not on file    Gets together: Not on file  Attends religious service: Not on file    Active member of club or organization: Not on file    Attends meetings of clubs or organizations: Not on file    Relationship status: Not on file  Other Topics Concern  . Not on file  Social History Narrative   Lives in [place], daughter and her 2 kids live with patient, 3 kids (2 sons, 1 daughter)   Divorced   Family History  Problem Relation Age of Onset  . Heart disease Father        CABG  . Stroke Father   . Diabetes Father   . Hypertension Mother   . Alzheimer's disease Mother   . Diabetes Sister   . Hypertension Sister   . Cancer Sister        uterine cancer  . Diabetes Brother   . Throat cancer Brother        Throat  . Diabetes Sister   . Hypertension Sister   . Throat cancer Other        Throat  . Diabetes Other   . Alcohol abuse Other   . Diabetes Maternal Grandmother        Insulin  . Colon cancer Cousin   . Drug abuse Neg Hx   . Prostate cancer Neg Hx        Review of Systems: Pertinent positive and negative review of systems were noted in the above HPI section. All other review of systems were otherwise negative.   Physical Exam: General: Well developed, well nourished, no acute distress Head: Normocephalic and atraumatic Eyes:  sclerae anicteric, EOMI Ears: Normal auditory acuity Mouth: No deformity or lesions Neck: Supple, no masses or thyromegaly Lungs: Clear throughout to auscultation Heart: Regular rate and rhythm; no murmurs, rubs or bruits Abdomen: Soft, non tender and non distended. No masses,  hepatosplenomegaly or hernias noted. Normal Bowel sounds Rectal: Not done Musculoskeletal: Symmetrical with no gross deformities  Skin: No lesions on visible extremities Pulses:  Normal pulses noted Extremities: No clubbing, cyanosis, edema or deformities noted Neurological: Alert oriented x 4, grossly nonfocal Cervical Nodes:  No significant cervical adenopathy Inguinal Nodes: No significant inguinal adenopathy Psychological:  Alert and cooperative. Normal mood and affect   Assessment and Recommendations:  1.  Long segment Barrett's esophagus without dysplasia.  GERD.  Continue omeprazole 20 mg daily and follow standard antireflux measures.  Schedule surveillance EGD. The risks (including bleeding, perforation, infection, missed lesions, medication reactions and possible hospitalization or surgery if complications occur), benefits, and alternatives to endoscopy with possible biopsy and possible dilation were discussed with the patient and they consent to proceed.     cc: Tim Ghent, MD [address] [place],   [address]

## 2019-09-28 NOTE — Patient Instructions (Signed)
We have sent the following medications to your pharmacy for you to pick up at your convenience: omeprazole.   You have been scheduled for an endoscopy. Please follow written instructions given to you at your visit today. If you use inhalers (even only as needed), please bring them with you on the day of your procedure.   Thank you for choosing me and [facility].  Pricilla Riffle. Dagoberto Ligas., MD., [facility]

## 2019-10-10 ENCOUNTER — Other Ambulatory Visit: Payer: Self-pay | Admitting: Gastroenterology

## 2019-10-10 ENCOUNTER — Ambulatory Visit (INDEPENDENT_AMBULATORY_CARE_PROVIDER_SITE_OTHER): Payer: Medicare Other

## 2019-10-10 DIAGNOSIS — Z1159 Encounter for screening for other viral diseases: Secondary | ICD-10-CM

## 2019-10-11 LAB — SARS CORONAVIRUS 2 (TAT 6-24 HRS): SARS Coronavirus 2: NEGATIVE

## 2019-10-12 ENCOUNTER — Other Ambulatory Visit: Payer: Self-pay

## 2019-10-12 ENCOUNTER — Encounter: Payer: Self-pay | Admitting: Gastroenterology

## 2019-10-12 ENCOUNTER — Ambulatory Visit (AMBULATORY_SURGERY_CENTER): Payer: Medicare Other | Admitting: Gastroenterology

## 2019-10-12 VITALS — BP 119/74 | HR 94 | Temp 98.0°F | Resp 19 | Ht 70.0 in | Wt 241.0 lb

## 2019-10-12 DIAGNOSIS — K2270 Barrett's esophagus without dysplasia: Secondary | ICD-10-CM | POA: Diagnosis not present

## 2019-10-12 DIAGNOSIS — K219 Gastro-esophageal reflux disease without esophagitis: Secondary | ICD-10-CM

## 2019-10-12 DIAGNOSIS — K449 Diaphragmatic hernia without obstruction or gangrene: Secondary | ICD-10-CM

## 2019-10-12 MED ORDER — SODIUM CHLORIDE 0.9 % IV SOLN: 500.0000 mL | Freq: Once | INTRAVENOUS | Status: DC

## 2019-10-12 NOTE — Progress Notes (Signed)
Called to room to assist during endoscopic procedure.  Patient ID and intended procedure confirmed with present staff. Received instructions for my participation in the procedure from the performing physician.  

## 2019-10-12 NOTE — Patient Instructions (Signed)
Impression/Recommendations:  Hiatal hernia handout given to patient. Barrett's esophagus handout given to patient.  Resume previous diet. Continue present medications. Await pathology results.  Repeat upper endoscopy in 3 years for surveillance of Barrett's esophagus if no dysplasia.  YOU HAD AN ENDOSCOPIC PROCEDURE TODAY AT [facility]:   Refer to the procedure report that was given to you for any specific questions about what was found during the examination.  If the procedure report does not answer your questions, please call your gastroenterologist to clarify.  If you requested that your care partner not be given the details of your procedure findings, then the procedure report has been included in a sealed envelope for you to review at your convenience later.  YOU SHOULD EXPECT: Some feelings of bloating in the abdomen. Passage of more gas than usual.  Walking can help get rid of the air that was put into your GI tract during the procedure and reduce the bloating. If you had a lower endoscopy (such as a colonoscopy or flexible sigmoidoscopy) you may notice spotting of blood in your stool or on the toilet paper. If you underwent a bowel prep for your procedure, you may not have a normal bowel movement for a few days.  Please Note:  You might notice some irritation and congestion in your nose or some drainage.  This is from the oxygen used during your procedure.  There is no need for concern and it should clear up in a day or so.  SYMPTOMS TO REPORT IMMEDIATELY:  Following upper endoscopy (EGD)  Vomiting of blood or coffee ground material  New chest pain or pain under the shoulder blades  Painful or persistently difficult swallowing  New shortness of breath  Fever of 100F or higher  Black, tarry-looking stools  For urgent or emergent issues, a gastroenterologist can be reached at any hour by calling [phone].   DIET:  We do recommend a small meal at first, but  then you may proceed to your regular diet.  Drink plenty of fluids but you should avoid alcoholic beverages for 24 hours.  ACTIVITY:  You should plan to take it easy for the rest of today and you should NOT DRIVE or use heavy machinery until tomorrow (because of the sedation medicines used during the test).    FOLLOW UP: Our staff will call the number listed on your records 48-72 hours following your procedure to check on you and address any questions or concerns that you may have regarding the information given to you following your procedure. If we do not reach you, we will leave a message.  We will attempt to reach you two times.  During this call, we will ask if you have developed any symptoms of COVID 19. If you develop any symptoms (ie: fever, flu-like symptoms, shortness of breath, cough etc.) before then, please call [phone].  If you test positive for Covid 19 in the 2 weeks post procedure, please call and report this information to [place].    If any biopsies were taken you will be contacted by phone or by letter within the next 1-3 weeks.  Please call us at [phone] if you have not heard about the biopsies in 3 weeks.    SIGNATURES/CONFIDENTIALITY: You and/or your care partner have signed paperwork which will be entered into your electronic medical record.  These signatures attest to the fact that that the information above on your After Visit Summary has been reviewed and is understood.  Full responsibility of the confidentiality of this discharge information lies with you and/or your care-partner.

## 2019-10-12 NOTE — Op Note (Signed)
[facility] Patient Name: Tim Mccullough Procedure Date: 10/12/2019 9:50 AM MRN: [id number] Endoscopist: Ladene Artist , MD Age: 68 Referring MD:  Date of Birth: 1951/05/05 Gender: Male Account #: [id number] Procedure:                Upper GI endoscopy Indications:              Surveillance for malignancy due to personal history                            of Barrett's esophagus Medicines:                Monitored Anesthesia Care Procedure:                Pre-Anesthesia Assessment:                           - Prior to the procedure, a History and Physical                            was performed, and patient medications and                            allergies were reviewed. The patient's tolerance of                            previous anesthesia was also reviewed. The risks                            and benefits of the procedure and the sedation                            options and risks were discussed with the patient.                            All questions were answered, and informed consent                            was obtained. Prior Anticoagulants: The patient has                            taken no previous anticoagulant or antiplatelet                            agents. ASA Grade Assessment: II - A patient with                            mild systemic disease. After reviewing the risks                            and benefits, the patient was deemed in                            satisfactory condition to undergo the procedure.  After obtaining informed consent, the endoscope was                            passed under direct vision. Throughout the                            procedure, the patient's blood pressure, pulse, and                            oxygen saturations were monitored continuously. The                            Endoscope was introduced through the mouth, and                            advanced to the second part of  duodenum. The upper                            GI endoscopy was accomplished without difficulty.                            The patient tolerated the procedure well. Scope In: Scope Out: Findings:                 There were esophageal mucosal changes secondary to                            established long-segment Barrett's disease present                            in the distal esophagus. The maximum longitudinal                            extent of these mucosal changes was 7 cm in length.                            Mucosa was biopsied with a cold forceps for                            histology. A total of 2 specimen bottles were sent                            to pathology, #1 from 35-38 cm and #2 from 31-35 cm.                           The exam of the esophagus was otherwise normal.                           A small hiatal hernia was present.                           The exam of the stomach was otherwise normal.  A single 2 mm angioectasia without bleeding was                            found in the duodenal bulb.                           The exam of the duodenum was otherwise normal. Complications:            No immediate complications. Estimated Blood Loss:     Estimated blood loss was minimal. Impression:               - Esophageal mucosal changes secondary to                            established long-segment Barrett's disease.                            Biopsied.                           - Small hiatal hernia.                           - Small duodenum bulb AVM. Recommendation:           - Patient has a contact number available for                            emergencies. The signs and symptoms of potential                            delayed complications were discussed with the                            patient. Return to normal activities tomorrow.                            Written discharge instructions were provided to the                             patient.                           - Resume previous diet.                           - Continue present medications.                           - Await pathology results.                           - Repeat upper endoscopy in 3 years for                            surveillance of Barrett's esophagus if no dysplasia. Ladene Artist, MD 10/12/2019 10:12:42 AM This report has been signed electronically.

## 2019-10-12 NOTE — Progress Notes (Signed)
Temp check by:LC  Vital check by:CW  The medical and surgical history was reviewed and verified with the patient.

## 2019-10-12 NOTE — Progress Notes (Signed)
Report to PACU, RN, vss, BBS= Clear.  

## 2019-10-16 ENCOUNTER — Telehealth: Payer: Self-pay | Admitting: *Deleted

## 2019-10-16 NOTE — Telephone Encounter (Signed)
  Follow up Call-  Call back number 10/12/2019  Post procedure Call Back phone  # [phone]  Permission to leave phone message Yes  Some recent data might be hidden     Patient questions:  Do you have a fever, pain , or abdominal swelling? No. Pain Score  0 *  Have you tolerated food without any problems? Yes.    Have you been able to return to your normal activities? Yes.    Do you have any questions about your discharge instructions: Diet   No. Medications  No. Follow up visit  No.  Do you have questions or concerns about your Care? No.  Actions: * If pain score is 4 or above: No action needed, pain <4.   1. Have you developed a fever since your procedure? no  2.   Have you had an respiratory symptoms (SOB or cough) since your procedure? no  3.   Have you tested positive for COVID 19 since your procedure no  4.   Have you had any family members/close contacts diagnosed with the COVID 19 since your procedure?  no   If yes to any of these questions please route to Joylene John, RN and Alphonsa Gin, Therapist, sports.

## 2019-10-23 ENCOUNTER — Encounter: Payer: Self-pay | Admitting: Gastroenterology

## 2019-10-23 ENCOUNTER — Ambulatory Visit (INDEPENDENT_AMBULATORY_CARE_PROVIDER_SITE_OTHER): Payer: Medicare Other | Admitting: Family

## 2019-10-23 ENCOUNTER — Other Ambulatory Visit: Payer: Self-pay

## 2019-10-23 ENCOUNTER — Encounter: Payer: Self-pay | Admitting: Family

## 2019-10-23 VITALS — BP 120/70 | HR 87 | Temp 97.2°F | Ht 70.0 in | Wt 242.0 lb

## 2019-10-23 DIAGNOSIS — I5022 Chronic systolic (congestive) heart failure: Secondary | ICD-10-CM | POA: Diagnosis not present

## 2019-10-23 DIAGNOSIS — E782 Mixed hyperlipidemia: Secondary | ICD-10-CM

## 2019-10-23 DIAGNOSIS — I493 Ventricular premature depolarization: Secondary | ICD-10-CM

## 2019-10-23 DIAGNOSIS — I491 Atrial premature depolarization: Secondary | ICD-10-CM

## 2019-10-23 DIAGNOSIS — I779 Disorder of arteries and arterioles, unspecified: Secondary | ICD-10-CM

## 2019-10-23 DIAGNOSIS — I10 Essential (primary) hypertension: Secondary | ICD-10-CM | POA: Diagnosis not present

## 2019-10-23 DIAGNOSIS — I4510 Unspecified right bundle-branch block: Secondary | ICD-10-CM

## 2019-10-23 DIAGNOSIS — K219 Gastro-esophageal reflux disease without esophagitis: Secondary | ICD-10-CM | POA: Diagnosis not present

## 2019-10-23 NOTE — Progress Notes (Signed)
Office Visit    Patient Name: Tim Mccullough Date of Encounter: 10/23/2019  Primary Care Provider:  Tonia Ghent, MD Primary Cardiologist:  [name], MD Electrophysiologist:  None   Chief Complaint    Tim Mccullough is a 68 y.o. male with a hx of chronic systolic HF due to NICM initially diagnosed 2010, HLD, DM2, HTN, frequent PAC presents today for annual follow up of chronic systolic heart failure and NICM.   Past Medical History    Past Medical History:  Diagnosis Date  . Abnormal echocardiogram 2/11   Repeated after med mgmt of cardiomyopathy showed EF 40-45%, mildly dilated LV, mild LV hypertrophy, anterolateral and apical hypokinesis, mild diastolic dysfunction.  . Anemia    hx  . Arthritis   . Blood transfusion without reported diagnosis 09/2012  . BPH (benign prostatic hypertrophy) 2004  . Cancer ([place])    melanoma back  . Cardiomyopathy    ? tachycardia-mediated  . Depression   . Diabetes mellitus 11/2006   Type II  . Diverticulosis   . Echocardiogram abnormal 7/10   Mild LVH with a mildly dilated LV.  EF was difficult to assess given poor acoustic windows but appeared moderately decreased.  Mild MR.  Probable small perimembranous VSD, mild LAE  . Esophageal erosions   . GERD (gastroesophageal reflux disease)   . Hip pain   . History of MRI 7/10   Cardiac MRI was done to followup difficult echo.  This showed mild to moderately dilated LV, mild to moderate LAE, EF  33% with global hypokinesis, normal RV size with mild systolic dysfunction.  There was no large VSD evident.  There was no myocardial delayed enhancement so no evidence for infiltrative disease or infarction.  LHC (7/10) showed only luminal irreg. in the coronaries and EF 35%  . Holter monitor, abnormal 7/10   Frequent PAC's and PVC's  Average HR 96  (but he had started Toprol XL).  HR range 71-130  . Hyperlipidemia 04/1998  . Hypertension 1995  . Obesity   . OSA (obstructive sleep  apnea)    Not using CPAP- improved with weight loss  . Pneumonia    HX OF pna  . Sinus tachycardia    Pt says he has been told his heart rate is high since he was in high school.   . Sleep apnea    has cpap; but does not use  . VSD (ventricular septal defect)    Probable small perimembranous   Past Surgical History:  Procedure Laterality Date  . CARDIAC CATHETERIZATION  05/30/2009   Dilated cardiomyopathy.  min VSD.  Hypokin Inf  wall.  Multiple min luminal Irr.  EF 35%  . COLONOSCOPY W/ BIOPSIES  08/24/2006   Divertics, polyps x3, bx negative, repeat 2012 with mild diverticulosis in teh sigmoid colon and 1 polyp in tranverse colon, repeat due 2017  . ESOPHAGOGASTRODUODENOSCOPY  2012   1)Barrett's esoph, 2) Erosive esophagitis, 3) Mild gastritis in the antrum, 4) Duodenitis in the bulb of duodenum, 5) Small hiatal hernia  . INGUINAL HERNIA REPAIR  1985   Left  . INGUINAL HERNIA REPAIR  1999   Right  . KNEE ARTHROSCOPY  10/15/2011   Procedure: ARTHROSCOPY KNEE;  Surgeon: Ninetta Lights, MD;  Location: [facility];  Service: Orthopedics;  Laterality: Right;  right knee scope with lateral meniscectomy, removal loose foreign body, and microfracture technique  . LAMINECTOMY  1987   with discectomy  . ROTATOR  CUFF REPAIR  2/11   rt  . TOTAL HIP ARTHROPLASTY  2011   Left  . TOTAL KNEE ARTHROPLASTY  06/01/2012   Procedure: TOTAL KNEE ARTHROPLASTY;  Surgeon: Ninetta Lights, MD;  Location: [facility];  Service: Orthopedics;  Laterality: Right;    Allergies  No Known Allergies  History of Present Illness    Tim Mccullough is a 68 y.o. male with a hx of chronic systolic HF due to NICM initially diagnosed 2010, HLD, DM2, HTN, frequent PAC last seen by Dr. Fletcher Anon 09/30/18. Presents today for annual follow up of his heart failure and NICM.  NICM and systolic heart failure initially diagnosed 2010. Cardiac cath with minor luminal irregularities and no obstructive CAD, EF 35%.  03/2012 EF 50-55%. Carotid doppler 2017 for carotid bruit with mild bilateral ICA disease and moderate common carotid disease. Repeat testing 07/2017 with improvement.   He reports feeling well. Enjoys spending time with his 36 and 37 year old granddaughter who stay with him every couple of weeks. He endorses he has not been as active as he once was and notices some DOE. Encouraged increased physical activity. He eats most of his meals at home and does try to look at nutrition labels for low sodium options.   He reports intermittent LE edema that resolves by morning. He has noted varicose veins to bilateral LE.  Tells me he is aware of his irregular heart beats. He notices them when laying down and resting. They are not associated with SOB nor chest pain. They do not interfere with his daily activities. He takes no prescribed nor over the counter proarrthymic medications. We discussed triggers for PVC/PAC including etoh (does not drink), caffeine (drinks diet coke), and stress.  He reports no chest pain, pressure, tightness. No orthopnea nor PND. He reports no dizziness, lightheadedness, near syncope, nor syncope.   Monitors his BP regularly at home and reports readings are consistently 110s-120s/60s-70s.  EKGs/Labs/Other Studies Reviewed:   The following studies were reviewed today:  Echo 03/2012 - Left ventricle: The cavity size was normal. Wall thickness    was normal. Systolic function was at the lower limits of    normal. The estimated ejection fraction was in the range    of 50% to 55%. Wall motion was normal; there were no    regional wall motion abnormalities. Doppler parameters are    consistent with abnormal left ventricular relaxation    (grade 1 diastolic dysfunction).  - Mitral valve: Valve area by pressure half-time: 2.37cm^2.  - Left atrium: The atrium was mildly dilated.  - Atrial septum: A septal defect cannot be excluded.  Transthoracic echocardiography.  M-mode, complete  2D,  spectral Doppler, and color Doppler.  Height:  Height:  180.3cm. Height: 71in.  Weight:  Weight: 108.9kg. Weight:  239.5lb.  Body mass index:  BMI: 33.5kg/m^2.  Body surface  area:    BSA: 2.78m^2.  Blood pressure:     110/80.  Patient  status:  Outpatient.   Carotid duplex 07/2017 Minimal plaque, bilaterally. 1-39% bilateral ICA stenosis. Examination of the left CCA found it to be widely patent. Patent vertebral arteries with antegrade flow. Normal subclavian arteries, bilaterally.  EKG:  EKG is ordered today.  The ekg ordered today demonstrates SR with PVC in bigeminy pattern and known RBBB.  Recent Labs: 04/26/2019: ALT 21; BUN 25; Creatinine, Ser 1.03; Hemoglobin 13.7; Platelets 218.0; Potassium 4.8; Sodium 140  Recent Lipid Panel    Component Value Date/Time   CHOL  142 08/21/2019 0819   CHOL 178 08/28/2016 0806   TRIG 137.0 08/21/2019 0819   HDL 38.70 (L) 08/21/2019 0819   HDL 48 08/28/2016 0806   CHOLHDL 4 08/21/2019 0819   VLDL 27.4 08/21/2019 0819   LDLCALC 76 08/21/2019 0819   LDLCALC 106 (H) 08/28/2016 0806   LDLDIRECT 138.8 06/17/2011 0850    Home Medications   Current Meds  Medication Sig  . aspirin 81 MG tablet Take 81 mg by mouth 2 (two) times daily.  Marland Kitchen atorvastatin (LIPITOR) 40 MG tablet TAKE 1 TABLET BY MOUTH EVERY DAY  . carvedilol (COREG) 12.5 MG tablet TAKE 1 TABLET BY MOUTH 2 TIMES DAILY WITH A MEAL.  Marland Kitchen doxazosin (CARDURA) 1 MG tablet 1 tablet daily  . fish oil-omega-3 fatty acids 1000 MG capsule Take 1 g by mouth 2 (two) times daily.   Marland Kitchen glimepiride (AMARYL) 1 MG tablet 2 tabs in the AM, 1 in the PM  . Lancets (ONETOUCH ULTRASOFT) lancets USE DAILY AS NEEDED TO CHECK SUGAR, DX 250.00  . losartan (COZAAR) 25 MG tablet Take 1 tablet (25 mg total) by mouth daily.  . meloxicam (MOBIC) 15 MG tablet TAKE 1 TABLET BY MOUTH EVERY DAY  . metFORMIN (GLUCOPHAGE) 500 MG tablet Take 2 tablets (1,000 mg total) by mouth 2 (two) times daily with a meal.  .  nystatin (MYCOSTATIN/NYSTOP) powder Apply topically 2 (two) times daily as needed.  Marland Kitchen omeprazole (PRILOSEC) 20 MG capsule Take 1 capsule (20 mg total) by mouth daily.  . ONE TOUCH ULTRA TEST test strip CHECK BLOOD SUGAR DAILY AND IF BLOOD SUGAR FLUCTUATES. 250.00  . PARoxetine (PAXIL) 20 MG tablet Take 1 tablet (20 mg total) by mouth daily.    Review of Systems    Review of Systems  Constitution: Negative for chills, fever and malaise/fatigue.  Cardiovascular: Positive for dyspnea on exertion and leg swelling (intermittent). Negative for chest pain, near-syncope, orthopnea and palpitations.  Respiratory: Negative for cough, shortness of breath and wheezing.   Gastrointestinal: Negative for nausea and vomiting.  Neurological: Negative for dizziness, light-headedness and weakness.   All other systems reviewed and are otherwise negative except as noted above.  Physical Exam    VS:  BP 120/70 (BP Location: Left Arm, Patient Position: Sitting, Cuff Size: Normal)   Pulse 87   Temp (!) 97.2 F (36.2 C)   Ht 5\' 10"  (1.778 m)   Wt 242 lb (109.8 kg)   SpO2 96%   BMI 34.72 kg/m  , BMI Body mass index is 34.72 kg/m. GEN: Well nourished, well developed, in no acute distress. HEENT: normal. Neck: Supple, no JVD, carotid bruits, or masses. Cardiac: irregular, no murmurs, rubs, or gallops. No clubbing, cyanosis, edema.  Radials/DP/PT 2+ and equal bilaterally.  Respiratory:  Respirations regular and unlabored, clear to auscultation bilaterally. GI: Soft, nontender, nondistended, BS + x 4. MS: No deformity or atrophy. Skin: Warm and dry, no rash. Varicose vein to bilateral LE. Neuro:  Strength and sensation are intact. Psych: Normal affect.  Accessory Clinical Findings    ECG personally reviewed by me today - SR with PVC in bigeminy. RBBB. No acute ST/T wave changes. - no acute changes.  Assessment & Plan    1. Chronic systolic heart failure/NICM - EF 2013 improved to 50-55% from 35%. He  reports some DOE with more than normal activity and intermittent LE edema. Continue low sodium, heart healthy diet. Endorses decreased physical activity, DOE likely deconditioning. Continue GDMT beta blocker and ARB.  Will update echocardiogram.   2. HTN - BP well controlled. Continue present antihypertensive regimen including carvedilol, cardura, losartan.  3. HLD - Lipid panel 08/21/19 with total cholesterol 142, HDL 38.7, LDL 76, triglycerides 137. Continue omega 3 and atorvastatin 40mg .   4. DM2 - Follows with his PCP. If additional agent needed consider SGLT2i for cardioprotective benefit.   5. Bilateral carotid disease - Repeat carotid doppler 07/2017 with mild nonobstructive disease. Lipids well controlled, continue statin.   6. Frequent PACs - Presently on Carvedilol. No PAC noted on EKG today. Continue to monitor.  7. PVC in bigeminy - Noted on EKG today. Reports he only notices palpitations when laying down at night. No SOB nor pain associated with palpitations. Overall asymptomatic. Discussed triggers - does not smoke/drink, drinks some caffeine, no proarrthymic medications noted. Will update echocardiogram to rule out physiologic cause. If palpitations are more symptomatic, consider increased dose of Carvedilol or transition to Metoprolol to avoid hypotension.  8. RBBB - Longstanding history. No near-syncope. Continue to monitor with annual EKG. He will report new lightheadedness, dizziness, near-syncope.  9. Barrett's esophagus/GERD - Follows with GI. Reports symptoms are well controlled.   Disposition: Echocardiogram. Follow up in 1 year(s) with Dr. Fletcher Anon or APP.  If echo is with acute findings, will schedule for sooner follow up.   Loel Dubonnet, NP 10/23/2019, 3:39 PM

## 2019-10-23 NOTE — Patient Instructions (Addendum)
Medication Instructions:  Your physician recommends that you continue on your current medications as directed. Please refer to the Current Medication list given to you today.  *If you need a refill on your cardiac medications before your next appointment, please call your pharmacy*  Lab Work: none If you have labs (blood work) drawn today and your tests are completely normal, you will receive your results only by: Marland Kitchen MyChart Message (if you have MyChart) OR . A paper copy in the mail If you have any lab test that is abnormal or we need to change your treatment, we will call you to review the results.  Testing/Procedures: Your physician has requested that you have an echocardiogram. Echocardiography is a painless test that uses sound waves to create images of your heart. It provides your doctor with information about the size and shape of your heart and how well your heart's chambers and valves are working. This procedure takes approximately one hour. There are no restrictions for this procedure. You may get an IV, if needed, to receive an ultrasound enhancing agent through to better visualize your heart.    Follow-Up: At [facility], you and your health needs are our priority.  As part of our continuing mission to provide you with exceptional heart care, we have created designated Provider Care Teams.  These Care Teams include your primary Cardiologist (physician) and Advanced Practice Providers (APPs -  Physician Assistants and Nurse Practitioners) who all work together to provide you with the care you need, when you need it.  Your next appointment:   12 month(s)  The format for your next appointment:   In Person  Provider:    You may see [name], MD or one of the following Advanced Practice Providers on your designated Care Team:    Murray Hodgkins, NP  Christell Faith, PA-C      Medications and palpitation: 1. Avoid all over-the-counter antihistamines except  Claritin/Loratadine and Zyrtec/Cetrizine. 2. Avoid all combination including cold sinus allergies flu decongestant and sleep medications 3. You can use Robitussin DM Mucinex and Mucinex DM for cough. 4. can use Tylenol aspirin ibuprofen and naproxen but no combinations such as sleep or sinus.    Premature Ventricular Contraction  A premature ventricular contraction (PVC) is a common kind of irregular heartbeat (arrhythmia). These contractions are extra heartbeats that start in the ventricles of the heart and occur too early in the normal sequence. During the PVC, the heart's normal electrical pathway is not used, so the beat is shorter and less effective. In most cases, these contractions come and go and do not require treatment. What are the causes? Common causes of the condition include:  Smoking.  Drinking alcohol.  Certain medicines.  Some illegal drugs.  Stress.  Caffeine. Certain medical conditions can also cause PVCs:  Heart failure.  Heart attack, or coronary artery disease.  Heart valve problems.  Changes in minerals in the blood (electrolytes).  Low blood oxygen levels or high carbon dioxide levels. In many cases, the cause of this condition is not known. What are the signs or symptoms? The main symptom of this condition is fast or skipped heartbeats (palpitations). Other symptoms include:  Chest pain.  Shortness of breath.  Feeling tired.  Dizziness.  Difficulty exercising. In some cases, there are no symptoms. How is this diagnosed? This condition may be diagnosed based on:  Your medical history.  A physical exam. During the exam, the health care provider will check for irregular heartbeats.  Tests,  such as: ? An ECG (electrocardiogram) to monitor the electrical activity of your heart. ? An ambulatory cardiac monitor. This device records your heartbeats for 24 hours or more. ? Stress tests to see how exercise affects your heart rhythm and blood  supply. ? An echocardiogram. This test uses sound waves (ultrasound) to produce an image of your heart. ? An electrophysiology study (EPS). This test checks for electrical problems in your heart. How is this treated? Treatment for this condition depends on any underlying conditions, the type of PVCs that you are having, and how much the symptoms are interfering with your daily life. Possible treatments include:  Avoiding things that cause premature contractions (triggers). These include caffeine and alcohol.  Taking medicines if symptoms are severe or if the extra heartbeats are frequent.  Getting treatment for underlying conditions that cause PVCs.  Having an implantable cardioverter defibrillator (ICD), if you are at risk for a serious arrhythmia. The ICD is a small device that is inserted into your chest to monitor your heartbeat. When it senses an irregular heartbeat, it sends a shock to bring the heartbeat back to normal.  Having a procedure to destroy the portion of the heart tissue that sends out abnormal signals (catheter ablation). In some cases, no treatment is required. Follow these instructions at home: Lifestyle  Do not use any products that contain nicotine or tobacco, such as cigarettes, e-cigarettes, and chewing tobacco. If you need help quitting, ask your health care provider.  Do not use illegal drugs.  Exercise regularly. Ask your health care provider what type of exercise is safe for you.  Try to get at least 7-9 hours of sleep each night, or as much as recommended by your health care provider.  Find healthy ways to manage stress. Avoid stressful situations when possible. Alcohol use  Do not drink alcohol if: ? Your health care provider tells you not to drink. ? You are pregnant, may be pregnant, or are planning to become pregnant. ? Alcohol triggers your episodes.  If you drink alcohol: ? Limit how much you use to:  0-1 drink a day for women.  0-2 drinks  a day for men.  Be aware of how much alcohol is in your drink. In the [place] one drink equals one 12 oz bottle of beer (355 mL), one 5 oz glass of wine (148 mL), or one 1 oz glass of hard liquor (44 mL). General instructions  Take over-the-counter and prescription medicines only as told by your health care provider.  If caffeine triggers episodes of PVC, do not eat, drink, or use anything with caffeine in it.  Keep all follow-up visits as told by your health care provider. This is important. Contact a health care provider if you:  Feel palpitations. Get help right away if you:  Have chest pain.  Have shortness of breath.  Have sweating for no reason.  Have nausea and vomiting.  Become light-headed or you faint. Summary  A premature ventricular contraction (PVC) is a common kind of irregular heartbeat (arrhythmia).  In most cases, these contractions come and go and do not require treatment.  You may need to wear an ambulatory cardiac monitor. This records your heartbeats for 24 hours or more.  Treatment depends on any underlying conditions, the type of PVCs that you are having, and how much the symptoms are interfering with your daily life. This information is not intended to replace advice given to you by your health care provider. Make sure you  discuss any questions you have with your health care provider. Document Released: 06/12/2004 Document Revised: 07/21/2018 Document Reviewed: 07/21/2018 Elsevier Patient Education  2020 [employer].

## 2019-11-22 ENCOUNTER — Ambulatory Visit (INDEPENDENT_AMBULATORY_CARE_PROVIDER_SITE_OTHER): Payer: Medicare Other

## 2019-11-22 ENCOUNTER — Other Ambulatory Visit: Payer: Self-pay

## 2019-11-22 DIAGNOSIS — I5022 Chronic systolic (congestive) heart failure: Secondary | ICD-10-CM | POA: Diagnosis not present

## 2019-11-22 DIAGNOSIS — I493 Ventricular premature depolarization: Secondary | ICD-10-CM | POA: Diagnosis not present

## 2019-11-22 MED ORDER — PERFLUTREN LIPID MICROSPHERE
1.0000 mL | INTRAVENOUS | Status: AC | PRN
  Administered 2019-11-22: 2 mL via INTRAVENOUS

## 2019-11-28 ENCOUNTER — Other Ambulatory Visit: Payer: Self-pay

## 2019-11-28 ENCOUNTER — Encounter: Payer: Self-pay | Admitting: Family Medicine

## 2019-11-28 ENCOUNTER — Ambulatory Visit (INDEPENDENT_AMBULATORY_CARE_PROVIDER_SITE_OTHER): Payer: Medicare Other | Admitting: Family Medicine

## 2019-11-28 VITALS — BP 118/60 | HR 56 | Temp 97.2°F | Ht 70.0 in | Wt 237.0 lb

## 2019-11-28 DIAGNOSIS — I5022 Chronic systolic (congestive) heart failure: Secondary | ICD-10-CM

## 2019-11-28 DIAGNOSIS — E119 Type 2 diabetes mellitus without complications: Secondary | ICD-10-CM

## 2019-11-28 LAB — BASIC METABOLIC PANEL
BUN: 23 mg/dL (ref 6–23)
CO2: 27 mEq/L (ref 19–32)
Calcium: 9 mg/dL (ref 8.4–10.5)
Chloride: 102 mEq/L (ref 96–112)
Creatinine, Ser: 0.95 mg/dL (ref 0.40–1.50)
GFR: 78.86 mL/min (ref >60.00)
Glucose, Bld: 144 mg/dL — ABNORMAL HIGH (ref 70–99)
Potassium: 4.6 mEq/L (ref 3.5–5.1)
Sodium: 139 mEq/L (ref 135–145)

## 2019-11-28 LAB — HEMOGLOBIN A1C: Hgb A1c MFr Bld: 6.8 % — ABNORMAL HIGH (ref 4.6–6.5)

## 2019-11-28 MED ORDER — GLIMEPIRIDE 1 MG PO TABS: ORAL_TABLET | ORAL | Status: DC

## 2019-11-28 NOTE — Patient Instructions (Addendum)
[email or web address]  Go to the lab on the way out.   If you have mychart we'll likely use that to update you.     Take care.  Glad to see you. Plan on recheck in about 3 months, A1c at the visit.

## 2019-11-28 NOTE — Progress Notes (Signed)
This visit occurred during the SARS-CoV-2 public health emergency.  Safety protocols were in place, including screening questions prior to the visit, additional usage of staff PPE, and extensive cleaning of exam room while observing appropriate contact time as indicated for disinfecting solutions.  Diabetes:  Using medications without difficulties: yes Hypoglycemic episodes: not since taking lower dose of glimepiride, down to 1mg  in the AM and 1 in the PM.    Hyperglycemic episodes: no Feet problems: no Blood Sugars averaging: ~120-150 eye exam within last year: due, d/w pt.  Prev pushed back with pandemic.    Dec in EF noted.  He noted that he was a little winded walking around xmas, no CP.  He attributed that to deconditioning at the time.  Repeat labs pending.   He has cards f/u pending with possible med changes noted, d/w pt.  I'll defer to cards.   His son moved to [place].  They are doing well.   We talked about covid vaccine and waitlist.  See avs.  Meds, vitals, and allergies reviewed.   ROS: Per HPI unless specifically indicated in ROS section   GEN: nad, alert and oriented HEENT: ncat NECK: supple w/o LA CV: rrr. PULM: ctab, no inc wob ABD: soft, +bs EXT: no edema SKIN: well perfused.

## 2019-12-01 NOTE — Assessment & Plan Note (Addendum)
No change in meds at this point.  See notes on labs.  See avs.

## 2019-12-01 NOTE — Assessment & Plan Note (Signed)
Dec in EF noted.  He noted that he was a little winded walking around xmas, no CP.  He attributed that to deconditioning at the time.  Repeat labs pending.   He has cards f/u pending with possible med changes noted, d/w pt.  I'll defer to cards.  See notes on labs.

## 2019-12-07 NOTE — Progress Notes (Addendum)
Office Visit    Patient Name: Tim Mccullough Date of Encounter: 12/08/2019  Primary Care Provider:  Tonia Ghent, MD Primary Cardiologist:  [name], MD Electrophysiologist:  None   Chief Complaint    Tim Mccullough is a 69 y.o. male with a hx of chronic systolic HF due to NICM initially diagnosed 2010, HLD, DM2, HTN, frequent PAC, PVC presents today for follow up after echocardiogram.   Past Medical History    Past Medical History:  Diagnosis Date  . Abnormal echocardiogram 2/11   Repeated after med mgmt of cardiomyopathy showed EF 40-45%, mildly dilated LV, mild LV hypertrophy, anterolateral and apical hypokinesis, mild diastolic dysfunction.  . Anemia    hx  . Arthritis   . Blood transfusion without reported diagnosis 09/2012  . BPH (benign prostatic hypertrophy) 2004  . Cancer ([place])    melanoma back  . Cardiomyopathy    ? tachycardia-mediated  . Depression   . Diabetes mellitus 11/2006   Type II  . Diverticulosis   . Echocardiogram abnormal 7/10   Mild LVH with a mildly dilated LV.  EF was difficult to assess given poor acoustic windows but appeared moderately decreased.  Mild MR.  Probable small perimembranous VSD, mild LAE  . Esophageal erosions   . GERD (gastroesophageal reflux disease)   . Hip pain   . History of MRI 7/10   Cardiac MRI was done to followup difficult echo.  This showed mild to moderately dilated LV, mild to moderate LAE, EF  33% with global hypokinesis, normal RV size with mild systolic dysfunction.  There was no large VSD evident.  There was no myocardial delayed enhancement so no evidence for infiltrative disease or infarction.  LHC (7/10) showed only luminal irreg. in the coronaries and EF 35%  . Holter monitor, abnormal 7/10   Frequent PAC's and PVC's  Average HR 96  (but he had started Toprol XL).  HR range 71-130  . Hyperlipidemia 04/1998  . Hypertension 1995  . Obesity   . OSA (obstructive sleep apnea)    Not using CPAP-  improved with weight loss  . Pneumonia    HX OF pna  . Sinus tachycardia    Pt says he has been told his heart rate is high since he was in high school.   . Sleep apnea    has cpap; but does not use  . VSD (ventricular septal defect)    Probable small perimembranous   Past Surgical History:  Procedure Laterality Date  . CARDIAC CATHETERIZATION  05/30/2009   Dilated cardiomyopathy.  min VSD.  Hypokin Inf  wall.  Multiple min luminal Irr.  EF 35%  . COLONOSCOPY W/ BIOPSIES  08/24/2006   Divertics, polyps x3, bx negative, repeat 2012 with mild diverticulosis in teh sigmoid colon and 1 polyp in tranverse colon, repeat due 2017  . ESOPHAGOGASTRODUODENOSCOPY  2012   1)Barrett's esoph, 2) Erosive esophagitis, 3) Mild gastritis in the antrum, 4) Duodenitis in the bulb of duodenum, 5) Small hiatal hernia  . INGUINAL HERNIA REPAIR  1985   Left  . INGUINAL HERNIA REPAIR  1999   Right  . KNEE ARTHROSCOPY  10/15/2011   Procedure: ARTHROSCOPY KNEE;  Surgeon: Ninetta Lights, MD;  Location: [facility];  Service: Orthopedics;  Laterality: Right;  right knee scope with lateral meniscectomy, removal loose foreign body, and microfracture technique  . LAMINECTOMY  1987   with discectomy  . ROTATOR CUFF REPAIR  2/11  rt  . TOTAL HIP ARTHROPLASTY  2011   Left  . TOTAL KNEE ARTHROPLASTY  06/01/2012   Procedure: TOTAL KNEE ARTHROPLASTY;  Surgeon: Ninetta Lights, MD;  Location: [facility];  Service: Orthopedics;  Laterality: Right;    Allergies  No Known Allergies  History of Present Illness    Tim Mccullough is a 69 y.o. male with a hx of chronic systolic HF due to NICM initially diagnosed 2010, HLD, DM2, HTN, frequent PAC last seen 10/23/2019. He enjoys spending time with his 4 and 66 year old granddaughters who spend time with him every few weeks.  NICM and systolic heart failure initially diagnosed 2010. Cardiac cath with minor luminal irregularities and no obstructive CAD, EF 35%.  03/2012 EF 50-55%. Carotid doppler 2017 for carotid bruit with mild bilateral ICA disease and moderate common carotid disease. Repeat testing 07/2017 with improvement.    At office visit 10/23/19 noted increased DOE. As his previous echo was in 2013, an updated echocardiogram was ordered. Echo 11/22/19 showed LVEF 30-35%, mildly dilated LV, RV normal size/function, LA mildly dilated, no significant valvular abnormality.   Is aware of his PACs when resting. They do not interfere with his daily activities. No noted proarrthymic medicaitons.   Monitors BP at home with readings 110s-120s/60s-70s.  We reviewed his echocardiogram in depth during office visit. His daughter Nathen May was on the phone during office visit. All questions were answered. We discussed guideline directed therapy for heart failure and the initiation of Entresto.   EKGs/Labs/Other Studies Reviewed:   The following studies were reviewed today:  Echo 11/22/19 1. Left ventricular ejection fraction, by visual estimation, is 30 to 35%. The left ventricle has moderate to severely decreased function. There is no left ventricular hypertrophy.  2. Left ventricular diastolic function could not be evaluated.  3. Mildly dilated left ventricular internal cavity size.  4. The left ventricle demonstrates global hypokinesis.  5. Global right ventricle has mildly reduced systolic function.The right ventricular size is normal. Right vetricular wall thickness was not assessed.  6. Left atrial size was mildly dilated.  7. Right atrial size was normal.  8. The mitral valve is normal in structure. No evidence of mitral valve regurgitation.  9. The tricuspid valve is grossly normal. 10. The aortic valve is grossly normal. Aortic valve regurgitation is not visualized. 11. Pulmonic regurgitation not assessed. 12. The pulmonic valve was not well visualized. Pulmonic valve regurgitation not assessed.  EKG:  EKG is ordered today.  The ekg ordered today  demonstrates SR with PVC in a pattern of bigeminty and no acute ST/T wave changes.   Recent Labs: 04/26/2019: ALT 21; Hemoglobin 13.7; Platelets 218.0 11/28/2019: BUN 23; Creatinine, Ser 0.95; Potassium 4.6; Sodium 139  Recent Lipid Panel    Component Value Date/Time   CHOL 142 08/21/2019 0819   CHOL 178 08/28/2016 0806   TRIG 137.0 08/21/2019 0819   HDL 38.70 (L) 08/21/2019 0819   HDL 48 08/28/2016 0806   CHOLHDL 4 08/21/2019 0819   VLDL 27.4 08/21/2019 0819   LDLCALC 76 08/21/2019 0819   LDLCALC 106 (H) 08/28/2016 0806   LDLDIRECT 138.8 06/17/2011 0850    Home Medications   Current Meds  Medication Sig  . aspirin 81 MG tablet Take 1 tablet (81 mg) by mouth once daily   . atorvastatin (LIPITOR) 40 MG tablet TAKE 1 TABLET BY MOUTH EVERY DAY  . carvedilol (COREG) 12.5 MG tablet TAKE 1 TABLET BY MOUTH 2 TIMES DAILY WITH A MEAL.  Marland Kitchen  doxazosin (CARDURA) 1 MG tablet 1 tablet daily  . fish oil-omega-3 fatty acids 1000 MG capsule Take 1 g by mouth 2 (two) times daily.   Marland Kitchen glimepiride (AMARYL) 1 MG tablet 1 tab in the AM, 1 in the PM  . Lancets (ONETOUCH ULTRASOFT) lancets USE DAILY AS NEEDED TO CHECK SUGAR, DX 250.00  . meloxicam (MOBIC) 15 MG tablet TAKE 1 TABLET BY MOUTH EVERY DAY  . metFORMIN (GLUCOPHAGE) 500 MG tablet Take 2 tablets (1,000 mg total) by mouth 2 (two) times daily with a meal.  . nystatin (MYCOSTATIN/NYSTOP) powder Apply topically 2 (two) times daily as needed.  Marland Kitchen omeprazole (PRILOSEC) 20 MG capsule Take 1 capsule (20 mg total) by mouth daily.  . ONE TOUCH ULTRA TEST test strip CHECK BLOOD SUGAR DAILY AND IF BLOOD SUGAR FLUCTUATES. 250.00  . PARoxetine (PAXIL) 20 MG tablet Take 1 tablet (20 mg total) by mouth daily.  . [DISCONTINUED] losartan (COZAAR) 25 MG tablet Take 1 tablet (25 mg total) by mouth daily.    Review of Systems      Review of Systems  Constitution: Negative for chills, fever and malaise/fatigue.  Cardiovascular: Positive for dyspnea on exertion and  leg swelling. Negative for chest pain, near-syncope, orthopnea, palpitations and syncope.  Respiratory: Negative for cough, shortness of breath and wheezing.   Gastrointestinal: Negative for nausea and vomiting.  Neurological: Negative for dizziness, light-headedness and weakness.   All other systems reviewed and are otherwise negative except as noted above.  Physical Exam    VS:  BP 100/68 (BP Location: Left Arm, Patient Position: Sitting, Cuff Size: Normal)   Pulse 88   Ht 5\' 10"  (1.778 m)   Wt 237 lb 12 oz (107.8 kg)   SpO2 96%   BMI 34.11 kg/m  , BMI Body mass index is 34.11 kg/m. GEN: Well nourished, well developed, in no acute distress. HEENT: normal. Neck: Supple, no JVD, carotid bruits, or masses. Cardiac: RRR, no murmurs, rubs, or gallops. No clubbing, cyanosis, edema.  Radials/PT 2+ and equal bilaterally.  Respiratory:  Respirations regular and unlabored, clear to auscultation bilaterally. GI: Soft, nontender, nondistended, BS + x 4. MS: No deformity or atrophy. Skin: Warm and dry, no rash. Neuro:  Strength and sensation are intact. Psych: Normal affect.  Accessory Clinical Findings    ECG personally reviewed by me today - SR with PVC in a pattern of bigeminy rate 88 bpm - no acute changes.  Assessment & Plan    1. Chronic systolic heart failure/NICM - Recent echo with EF diminished 30-35%, mildly dilated LV size. Symptomatic with DOE and intermittent LE edema. NYHA II-III.   GDMT: Continue Coreg. Stop Losartan. Start Entresto 24-26mg  BID. Further optimization of GDMT may be limited by relatively low blood pressure.  Referral to cardiac rehab   Recommend low sodium diet. Recommend limit to 1.5 liter of fluid per day.   Due to worsening of EF without clear cause will likely need to pursue ischemic evaluation. Will discuss at upcoming office visit.   2. HTN - BP well controlled. Present antihypertensive regimen includes Carvedilol, Cardura. Stop Losartan and start  Entresto, as above. May have to discontinue Cardura at future date to optimize HF medications. He presently takes this for BPH and we discussed that we could potentially reduce the dose rather than discontinuing entirely and monitor BPH symptoms.   3. HLD - Lipid panel 08/21/19 LDL 76. Continue Omega 3 and Atorvastatin 40mg .   4. DM2 - Follows with PCP. Consider  addition of dapagliflozin for cardioprotective benefit at future date.   5. Bilateral carotid disease - Carotid doppler 07/2017 with mild nonobstructive disease. Lipids well controlled, continue statin.   6. PAC/PVC- Presently on Carvedilol. He notices palpitations intermittently at rest and not associated with SOB nor chest pain. He has PVC in a pattern of bigeminy today on EKG. Consider switch to Metoprolol at next office visit to be able to increase dose as further up-titration of Coreg is limited by low normal BP.  7. RBBB - Longstanding history. No lightheadedness, dizziness, near syncope. Continue to monitor with EKG.  Disposition: Follow up in 2 week(s) with Dr. Fletcher Anon or APP with [facility].  Loel Dubonnet, NP 12/08/2019, 12:22 PM

## 2019-12-08 ENCOUNTER — Encounter: Payer: Self-pay | Admitting: Family

## 2019-12-08 ENCOUNTER — Telehealth: Payer: Self-pay | Admitting: Cardiovascular Disease

## 2019-12-08 ENCOUNTER — Ambulatory Visit (INDEPENDENT_AMBULATORY_CARE_PROVIDER_SITE_OTHER): Payer: Medicare Other | Admitting: Family

## 2019-12-08 ENCOUNTER — Other Ambulatory Visit: Payer: Self-pay

## 2019-12-08 VITALS — BP 100/68 | HR 88 | Ht 70.0 in | Wt 237.8 lb

## 2019-12-08 DIAGNOSIS — I5042 Chronic combined systolic (congestive) and diastolic (congestive) heart failure: Secondary | ICD-10-CM | POA: Diagnosis not present

## 2019-12-08 DIAGNOSIS — I10 Essential (primary) hypertension: Secondary | ICD-10-CM

## 2019-12-08 DIAGNOSIS — E782 Mixed hyperlipidemia: Secondary | ICD-10-CM

## 2019-12-08 DIAGNOSIS — I491 Atrial premature depolarization: Secondary | ICD-10-CM | POA: Diagnosis not present

## 2019-12-08 DIAGNOSIS — I4510 Unspecified right bundle-branch block: Secondary | ICD-10-CM

## 2019-12-08 DIAGNOSIS — Z79899 Other long term (current) drug therapy: Secondary | ICD-10-CM

## 2019-12-08 DIAGNOSIS — I493 Ventricular premature depolarization: Secondary | ICD-10-CM

## 2019-12-08 MED ORDER — SACUBITRIL-VALSARTAN 24-26 MG PO TABS: 1.0000 | ORAL_TABLET | Freq: Two times a day (BID) | ORAL | 3 refills | Status: DC

## 2019-12-08 NOTE — Telephone Encounter (Signed)
Patient calling - saw C. Walker today  Went to pick up [employer] medication and it is too expensive  Would like to discuss other options

## 2019-12-08 NOTE — Telephone Encounter (Signed)
Spoke with the patients pharmacy. The patient was able to get the free 30 day supply of Entresto. The pharmacy could not tell me what the patients co-pay would be.  Spoke with the patient. Pt sts that he was told that his out of pocket cost for [facility] after insurance would be $400 a month.  Advised the patient that have talked with the prescribing provider Laurann Montana, NP and she is aware. Adv the pt that Urban Gibson would like to see him back in 2 weeks instead of 4 weeks. Patients appt rescheduled for 12/22/19 @ 8am.  Pt sts that he is going to contact his insurance company directly to inquire more about the medication copay.  FYI fwd to [place].

## 2019-12-08 NOTE — Patient Instructions (Addendum)
Medication Instructions:  - Your physician has recommended you make the following change in your medication:   1) Stop losartan  2) Tomorrow start entresto 24/26 mg- take 1 tablet by mouth twice a daily (every 12 hours)  3) Decrease aspirin 81 mg- take 1 tablet by mouth once daily  *If you need a refill on your cardiac medications before your next appointment, please call your pharmacy*  Lab Work: - Your physician recommends that you return for lab work in: 2 weeks- [facility] entrance, 1st desk on the right to check in - Monday- Friday (7:30 am- 5:30 pm)  If you have labs (blood work) drawn today and your tests are completely normal, you will receive your results only by: Marland Kitchen MyChart Message (if you have MyChart) OR . A paper copy in the mail If you have any lab test that is abnormal or we need to change your treatment, we will call you to review the results.  Testing/Procedures: - none ordered  Follow-Up: At [facility], you and your health needs are our priority.  As part of our continuing mission to provide you with exceptional heart care, we have created designated Provider Care Teams.  These Care Teams include your primary Cardiologist (physician) and Advanced Practice Providers (APPs -  Physician Assistants and Nurse Practitioners) who all work together to provide you with the care you need, when you need it.  Your next appointment:   4 week(s)  The format for your next appointment:   In Person  Provider:    You may see [name], MD or one of the following Advanced Practice Providers on your designated Care Team:    Murray Hodgkins, NP  Christell Faith, PA-C  Marrianne Mood, PA-C   Other Instructions - We are referring you to Cardiac Rehab

## 2019-12-11 ENCOUNTER — Other Ambulatory Visit: Payer: Self-pay | Admitting: *Deleted

## 2019-12-11 DIAGNOSIS — I5022 Chronic systolic (congestive) heart failure: Secondary | ICD-10-CM

## 2019-12-21 NOTE — Progress Notes (Signed)
Office Visit    Patient Name: Tim Mccullough Date of Encounter: 12/22/2019  Primary Care Provider:  Tonia Ghent, MD Primary Cardiologist:  [name], MD Electrophysiologist:  None   Chief Complaint    Tim Mccullough is a 69 y.o. male with a hx of chronic systolic HF due to NICM initially diagnosed 2010, HLD, DM2, HTN, frequent PAC, PVC  presents today for heart failure follow up after initiation of Entresto.   Past Medical History    Past Medical History:  Diagnosis Date  . Abnormal echocardiogram 2/11   Repeated after med mgmt of cardiomyopathy showed EF 40-45%, mildly dilated LV, mild LV hypertrophy, anterolateral and apical hypokinesis, mild diastolic dysfunction.  . Anemia    hx  . Arthritis   . Blood transfusion without reported diagnosis 09/2012  . BPH (benign prostatic hypertrophy) 2004  . Cancer ([place])    melanoma back  . Cardiomyopathy    ? tachycardia-mediated  . Depression   . Diabetes mellitus 11/2006   Type II  . Diverticulosis   . Echocardiogram abnormal 7/10   Mild LVH with a mildly dilated LV.  EF was difficult to assess given poor acoustic windows but appeared moderately decreased.  Mild MR.  Probable small perimembranous VSD, mild LAE  . Esophageal erosions   . GERD (gastroesophageal reflux disease)   . Hip pain   . History of MRI 7/10   Cardiac MRI was done to followup difficult echo.  This showed mild to moderately dilated LV, mild to moderate LAE, EF  33% with global hypokinesis, normal RV size with mild systolic dysfunction.  There was no large VSD evident.  There was no myocardial delayed enhancement so no evidence for infiltrative disease or infarction.  LHC (7/10) showed only luminal irreg. in the coronaries and EF 35%  . Holter monitor, abnormal 7/10   Frequent PAC's and PVC's  Average HR 96  (but he had started Toprol XL).  HR range 71-130  . Hyperlipidemia 04/1998  . Hypertension 1995  . Obesity   . OSA (obstructive sleep apnea)     Not using CPAP- improved with weight loss  . Pneumonia    HX OF pna  . Sinus tachycardia    Pt says he has been told his heart rate is high since he was in high school.   . Sleep apnea    has cpap; but does not use  . VSD (ventricular septal defect)    Probable small perimembranous   Past Surgical History:  Procedure Laterality Date  . CARDIAC CATHETERIZATION  05/30/2009   Dilated cardiomyopathy.  min VSD.  Hypokin Inf  wall.  Multiple min luminal Irr.  EF 35%  . COLONOSCOPY W/ BIOPSIES  08/24/2006   Divertics, polyps x3, bx negative, repeat 2012 with mild diverticulosis in teh sigmoid colon and 1 polyp in tranverse colon, repeat due 2017  . ESOPHAGOGASTRODUODENOSCOPY  2012   1)Barrett's esoph, 2) Erosive esophagitis, 3) Mild gastritis in the antrum, 4) Duodenitis in the bulb of duodenum, 5) Small hiatal hernia  . INGUINAL HERNIA REPAIR  1985   Left  . INGUINAL HERNIA REPAIR  1999   Right  . KNEE ARTHROSCOPY  10/15/2011   Procedure: ARTHROSCOPY KNEE;  Surgeon: Ninetta Lights, MD;  Location: [facility];  Service: Orthopedics;  Laterality: Right;  right knee scope with lateral meniscectomy, removal loose foreign body, and microfracture technique  . LAMINECTOMY  1987   with discectomy  . ROTATOR  CUFF REPAIR  2/11   rt  . TOTAL HIP ARTHROPLASTY  2011   Left  . TOTAL KNEE ARTHROPLASTY  06/01/2012   Procedure: TOTAL KNEE ARTHROPLASTY;  Surgeon: Ninetta Lights, MD;  Location: [facility];  Service: Orthopedics;  Laterality: Right;   Allergies  No Known Allergies  History of Present Illness    Tim Mccullough is a 69 y.o. male with a hx of chronic systolic HF due to NICM initially diagnosed 2010, HLD, DM2, HTN, frequent PAC, PVC last seen 12/08/2019.  NICM and systolic heart failure initially diagnosed 2010. Cardiac cath with minor luminal irregularities and no obstructive CAD, EF 35%. At that time noted to have probable small, hemodynamically insignificant VSD that did not  explain his cardiomyopathy. Cardiomyopathy thought to be tachy-mediated. 03/2012 EF 50-55%. Carotid doppler 2017 for carotid bruit with mild bilateral ICA disease and moderate common carotid disease.   At office visit 10/23/19 noted increased DOE. As his previous echo was in 2013, an updated echocardiogram was ordered. Echo 11/22/19 showed LVEF 30-35%, mildly dilated LV, RV normal size/function, LA mildly dilated, no significant valvular abnormality.    Is aware of his PVCs when resting. They do not interfere with his daily activities. No noted proarrthymic medicaitons.   When seen 12/08/19, Entresto was added for optimization of GDMT.  Tells me he feels about the same but it has only been 2 weeks and is agreeable to continue.  Report blood pressure at home routinely 100-120/60-70.  Reports his heart rate previously had been well controlled in the 60s 70s but he has noted over the last month or so that it has been up in the 80s or 90s.  We discussed his carvedilol likely cannot be increased due to blood pressure and he was agreeable to transition to metoprolol.  Reports continued DOE.  No chest pain, pressure, tightness.  No edema, orthopnea, PND.  We discussed the need for an ischemic evaluation in the setting of worsened EF to rule out ischemic origin.  Have discussed with Dr. Fletcher Anon who recommended right and left heart cath.  After discussion of risks and benefits of cardiac catheterization he is agreeable to proceed.  He has been referred to cardiac rehab for heart failure education but has not heard from them yet.  I will reach out to them to confirm they received referral.  EKGs/Labs/Other Studies Reviewed:   The following studies were reviewed today:  Echo 11/22/19 1. Left ventricular ejection fraction, by visual estimation, is 30 to 35%. The left ventricle has moderate to severely decreased function. There is no left ventricular hypertrophy.  2. Left ventricular diastolic function could not  be evaluated.  3. Mildly dilated left ventricular internal cavity size.  4. The left ventricle demonstrates global hypokinesis.  5. Global right ventricle has mildly reduced systolic function.The right ventricular size is normal. Right vetricular wall thickness was not assessed.  6. Left atrial size was mildly dilated.  7. Right atrial size was normal.  8. The mitral valve is normal in structure. No evidence of mitral valve regurgitation.  9. The tricuspid valve is grossly normal. 10. The aortic valve is grossly normal. Aortic valve regurgitation is not visualized. 11. Pulmonic regurgitation not assessed. 12. The pulmonic valve was not well visualized. Pulmonic valve regurgitation not assessed.  EKG:  EKG is ordered today.  The ekg ordered today demonstrates SR 93 bpm with frequent PVC and RBBB  Recent Labs: 04/26/2019: ALT 21; Hemoglobin 13.7; Platelets 218.0 11/28/2019: BUN 23; Creatinine,  Ser 0.95; Potassium 4.6; Sodium 139  Recent Lipid Panel    Component Value Date/Time   CHOL 142 08/21/2019 0819   CHOL 178 08/28/2016 0806   TRIG 137.0 08/21/2019 0819   HDL 38.70 (L) 08/21/2019 0819   HDL 48 08/28/2016 0806   CHOLHDL 4 08/21/2019 0819   VLDL 27.4 08/21/2019 0819   LDLCALC 76 08/21/2019 0819   LDLCALC 106 (H) 08/28/2016 0806   LDLDIRECT 138.8 06/17/2011 0850   Home Medications   Current Meds  Medication Sig  . aspirin 81 MG tablet Take 1 tablet (81 mg) by mouth once daily   . atorvastatin (LIPITOR) 40 MG tablet TAKE 1 TABLET BY MOUTH EVERY DAY  . carvedilol (COREG) 12.5 MG tablet TAKE 1 TABLET BY MOUTH 2 TIMES DAILY WITH A MEAL.  Marland Kitchen doxazosin (CARDURA) 1 MG tablet 1 tablet daily  . fish oil-omega-3 fatty acids 1000 MG capsule Take 1 g by mouth 2 (two) times daily.   Marland Kitchen glimepiride (AMARYL) 1 MG tablet 1 tab in the AM, 1 in the PM  . Lancets (ONETOUCH ULTRASOFT) lancets USE DAILY AS NEEDED TO CHECK SUGAR, DX 250.00  . meloxicam (MOBIC) 15 MG tablet TAKE 1 TABLET BY MOUTH  EVERY DAY  . metFORMIN (GLUCOPHAGE) 500 MG tablet Take 2 tablets (1,000 mg total) by mouth 2 (two) times daily with a meal.  . nystatin (MYCOSTATIN/NYSTOP) powder Apply topically 2 (two) times daily as needed.  Marland Kitchen omeprazole (PRILOSEC) 20 MG capsule Take 1 capsule (20 mg total) by mouth daily.  . ONE TOUCH ULTRA TEST test strip CHECK BLOOD SUGAR DAILY AND IF BLOOD SUGAR FLUCTUATES. 250.00  . PARoxetine (PAXIL) 20 MG tablet Take 1 tablet (20 mg total) by mouth daily.  . sacubitril-valsartan (ENTRESTO) 24-26 MG Take 1 tablet by mouth 2 (two) times daily.   Review of Systems    Review of Systems  Constitution: Negative for chills, fever and malaise/fatigue.  Cardiovascular: Positive for dyspnea on exertion. Negative for chest pain, leg swelling, near-syncope, orthopnea, palpitations and syncope.  Respiratory: Negative for cough, shortness of breath and wheezing.   Gastrointestinal: Negative for nausea and vomiting.  Neurological: Negative for dizziness, light-headedness and weakness.   All other systems reviewed and are otherwise negative except as noted above.  Physical Exam    VS:  BP 100/60 (BP Location: Left Arm, Patient Position: Sitting, Cuff Size: Normal)   Pulse 93   Temp (!) 97.2 F (36.2 C)   Ht 5\' 10"  (1.778 m)   Wt 240 lb 8 oz (109.1 kg)   SpO2 98%   BMI 34.51 kg/m  , BMI Body mass index is 34.51 kg/m. GEN: Well nourished, overweight, well developed, in no acute distress. HEENT: normal. Neck: Supple, no JVD, carotid bruits, or masses. Cardiac: RRR, no murmurs, rubs, or gallops. No clubbing, cyanosis, edema.  Radials/PT 2+ and equal bilaterally.  Respiratory:  Respirations regular and unlabored, clear to auscultation bilaterally. GI: Soft, nontender, nondistended. MS: No deformity or atrophy. Skin: Warm and dry, no rash. Neuro:  Strength and sensation are intact. Psych: Normal affect.  Accessory Clinical Findings    ECG personally reviewed by me today - SR 93 bpm  with frequent PVC and RBBB  - no acute changes.  Assessment & Plan    1. Chronic systolic failure- Recent echo with EF 30-35%, mildly dilated LV. Prior cardiomyopathy was presumed tachy mediated.  Euvolemic on exam today.  NYHA II-III with DOE.  GDMT: Continue Entresto 24-26mg  BID. This was started  12/08/19, BMET today for monitoring of renal function. Will submit prior auth as he reports pharmacy quoted him a price of $400. He has a free 30 day sample from pharmacy with coupon and was provided with an additional 7 day sample in the office today. If prior auth denied, will apply for patient assistance. Transition Coreg to Metoprolol for optimization of HR as low normal BP precludes up-titration of Coreg.   Anticipate addition of Spironolactone or up-titration of Entresto at next office visit.  Referral previously placed to cardiac rehab, will send staff message to ensure they received.  Recommend low sodium diet, limit to 1.5 L fluid per day.  Due to worsening EF without clear cause, will pursue ischemic evaluation to rule out ICM.  Per Dr. Tyrell Antonio recommendation, will proceed with left and right cardiac catheterization. Risk and benefits discussed with Mr. Fjeld today and he is agreeable to proceed. The patient understands that risks include but are not limited to stroke (1 in 1000), death (1 in 1), kidney failure [usually temporary] (1 in 500), bleeding (1 in 200), allergic reaction [possibly serious] (1 in 200), and agrees to proceed.   2. HTN - BP low normal, but asymptomatic with no lightheadedness, dizziness. Continue Entresto 24-26mg  BID. Continue Cardura 1mg  daily. Switch Coreg to Metoprolol, as below. Anticipate this transition will allow for better control of HR and for further up-titration of heart failure therapies. May need to reduce Cardura dose in the future, but he is hesitant due to BPH.  3. HLD - Lipid panel 08/21/19 LDL 76. Continue Omega 3 and Atorvastatin 40mg . If    4. DM2 - Follows with PCP. Consider addition of dapagliflozin in future for cardioprotective benefit. However, may be cost prohibitive.  5. Bilateral carotid disease - Carotid doppler 07/2017 with mild nonobstructive disease. Lipid well controlled, continue statin.    6. PAC/PVC - He is aware of PVC only when he is resting, overall asymptomatic.  We will stop carvedilol and transition to metoprolol tartrate 25 mg twice daily.  Previous cardiomyopathy was tachy-mediated and goal to optimize HR. This will allow for up titration of beta-blocker and hopefully reduction in heart rate.  7. RBBB - Stable finding. Continue to monitor with EKG.  Disposition: Follow up in 2 week(s) with Dr. Fletcher Anon or APP    Loel Dubonnet, NP 12/22/2019, 8:16 AM

## 2019-12-21 NOTE — H&P (View-Only) (Signed)
Office Visit    Patient Name: Tim Mccullough Date of Encounter: 12/22/2019  Primary Care Provider:  Tonia Ghent, MD Primary Cardiologist:  [name], MD Electrophysiologist:  None   Chief Complaint    Tim Mccullough is a 69 y.o. male with a hx of chronic systolic HF due to NICM initially diagnosed 2010, HLD, DM2, HTN, frequent PAC, PVC  presents today for heart failure follow up after initiation of Entresto.   Past Medical History    Past Medical History:  Diagnosis Date  . Abnormal echocardiogram 2/11   Repeated after med mgmt of cardiomyopathy showed EF 40-45%, mildly dilated LV, mild LV hypertrophy, anterolateral and apical hypokinesis, mild diastolic dysfunction.  . Anemia    hx  . Arthritis   . Blood transfusion without reported diagnosis 09/2012  . BPH (benign prostatic hypertrophy) 2004  . Cancer ([facility])    melanoma back  . Cardiomyopathy    ? tachycardia-mediated  . Depression   . Diabetes mellitus 11/2006   Type II  . Diverticulosis   . Echocardiogram abnormal 7/10   Mild LVH with a mildly dilated LV.  EF was difficult to assess given poor acoustic windows but appeared moderately decreased.  Mild MR.  Probable small perimembranous VSD, mild LAE  . Esophageal erosions   . GERD (gastroesophageal reflux disease)   . Hip pain   . History of MRI 7/10   Cardiac MRI was done to followup difficult echo.  This showed mild to moderately dilated LV, mild to moderate LAE, EF  33% with global hypokinesis, normal RV size with mild systolic dysfunction.  There was no large VSD evident.  There was no myocardial delayed enhancement so no evidence for infiltrative disease or infarction.  LHC (7/10) showed only luminal irreg. in the coronaries and EF 35%  . Holter monitor, abnormal 7/10   Frequent PAC's and PVC's  Average HR 96  (but he had started Toprol XL).  HR range 71-130  . Hyperlipidemia 04/1998  . Hypertension 1995  . Obesity   . OSA (obstructive sleep apnea)     Not using CPAP- improved with weight loss  . Pneumonia    HX OF pna  . Sinus tachycardia    Pt says he has been told his heart rate is high since he was in high school.   . Sleep apnea    has cpap; but does not use  . VSD (ventricular septal defect)    Probable small perimembranous   Past Surgical History:  Procedure Laterality Date  . CARDIAC CATHETERIZATION  05/30/2009   Dilated cardiomyopathy.  min VSD.  Hypokin Inf  wall.  Multiple min luminal Irr.  EF 35%  . COLONOSCOPY W/ BIOPSIES  08/24/2006   Divertics, polyps x3, bx negative, repeat 2012 with mild diverticulosis in teh sigmoid colon and 1 polyp in tranverse colon, repeat due 2017  . ESOPHAGOGASTRODUODENOSCOPY  2012   1)Barrett's esoph, 2) Erosive esophagitis, 3) Mild gastritis in the antrum, 4) Duodenitis in the bulb of duodenum, 5) Small hiatal hernia  . INGUINAL HERNIA REPAIR  1985   Left  . INGUINAL HERNIA REPAIR  1999   Right  . KNEE ARTHROSCOPY  10/15/2011   Procedure: ARTHROSCOPY KNEE;  Surgeon: Ninetta Lights, MD;  Location: [place];  Service: Orthopedics;  Laterality: Right;  right knee scope with lateral meniscectomy, removal loose foreign body, and microfracture technique  . LAMINECTOMY  1987   with discectomy  . ROTATOR  CUFF REPAIR  2/11   rt  . TOTAL HIP ARTHROPLASTY  2011   Left  . TOTAL KNEE ARTHROPLASTY  06/01/2012   Procedure: TOTAL KNEE ARTHROPLASTY;  Surgeon: Ninetta Lights, MD;  Location: [facility];  Service: Orthopedics;  Laterality: Right;   Allergies  No Known Allergies  History of Present Illness    Tim Mccullough is a 69 y.o. male with a hx of chronic systolic HF due to NICM initially diagnosed 2010, HLD, DM2, HTN, frequent PAC, PVC last seen 12/08/2019.  NICM and systolic heart failure initially diagnosed 2010. Cardiac cath with minor luminal irregularities and no obstructive CAD, EF 35%. At that time noted to have probable small, hemodynamically insignificant VSD that did not  explain his cardiomyopathy. Cardiomyopathy thought to be tachy-mediated. 03/2012 EF 50-55%. Carotid doppler 2017 for carotid bruit with mild bilateral ICA disease and moderate common carotid disease.   At office visit 10/23/19 noted increased DOE. As his previous echo was in 2013, an updated echocardiogram was ordered. Echo 11/22/19 showed LVEF 30-35%, mildly dilated LV, RV normal size/function, LA mildly dilated, no significant valvular abnormality.    Is aware of his PVCs when resting. They do not interfere with his daily activities. No noted proarrthymic medicaitons.   When seen 12/08/19, Entresto was added for optimization of GDMT.  Tells me he feels about the same but it has only been 2 weeks and is agreeable to continue.  Report blood pressure at home routinely 100-120/60-70.  Reports his heart rate previously had been well controlled in the 60s 70s but he has noted over the last month or so that it has been up in the 80s or 90s.  We discussed his carvedilol likely cannot be increased due to blood pressure and he was agreeable to transition to metoprolol.  Reports continued DOE.  No chest pain, pressure, tightness.  No edema, orthopnea, PND.  We discussed the need for an ischemic evaluation in the setting of worsened EF to rule out ischemic origin.  Have discussed with Dr. Fletcher Anon who recommended right and left heart cath.  After discussion of risks and benefits of cardiac catheterization he is agreeable to proceed.  He has been referred to cardiac rehab for heart failure education but has not heard from them yet.  I will reach out to them to confirm they received referral.  EKGs/Labs/Other Studies Reviewed:   The following studies were reviewed today:  Echo 11/22/19 1. Left ventricular ejection fraction, by visual estimation, is 30 to 35%. The left ventricle has moderate to severely decreased function. There is no left ventricular hypertrophy.  2. Left ventricular diastolic function could not  be evaluated.  3. Mildly dilated left ventricular internal cavity size.  4. The left ventricle demonstrates global hypokinesis.  5. Global right ventricle has mildly reduced systolic function.The right ventricular size is normal. Right vetricular wall thickness was not assessed.  6. Left atrial size was mildly dilated.  7. Right atrial size was normal.  8. The mitral valve is normal in structure. No evidence of mitral valve regurgitation.  9. The tricuspid valve is grossly normal. 10. The aortic valve is grossly normal. Aortic valve regurgitation is not visualized. 11. Pulmonic regurgitation not assessed. 12. The pulmonic valve was not well visualized. Pulmonic valve regurgitation not assessed.  EKG:  EKG is ordered today.  The ekg ordered today demonstrates SR 93 bpm with frequent PVC and RBBB  Recent Labs: 04/26/2019: ALT 21; Hemoglobin 13.7; Platelets 218.0 11/28/2019: BUN 23; Creatinine,  Ser 0.95; Potassium 4.6; Sodium 139  Recent Lipid Panel    Component Value Date/Time   CHOL 142 08/21/2019 0819   CHOL 178 08/28/2016 0806   TRIG 137.0 08/21/2019 0819   HDL 38.70 (L) 08/21/2019 0819   HDL 48 08/28/2016 0806   CHOLHDL 4 08/21/2019 0819   VLDL 27.4 08/21/2019 0819   LDLCALC 76 08/21/2019 0819   LDLCALC 106 (H) 08/28/2016 0806   LDLDIRECT 138.8 06/17/2011 0850   Home Medications   Current Meds  Medication Sig  . aspirin 81 MG tablet Take 1 tablet (81 mg) by mouth once daily   . atorvastatin (LIPITOR) 40 MG tablet TAKE 1 TABLET BY MOUTH EVERY DAY  . carvedilol (COREG) 12.5 MG tablet TAKE 1 TABLET BY MOUTH 2 TIMES DAILY WITH A MEAL.  Marland Kitchen doxazosin (CARDURA) 1 MG tablet 1 tablet daily  . fish oil-omega-3 fatty acids 1000 MG capsule Take 1 g by mouth 2 (two) times daily.   Marland Kitchen glimepiride (AMARYL) 1 MG tablet 1 tab in the AM, 1 in the PM  . Lancets (ONETOUCH ULTRASOFT) lancets USE DAILY AS NEEDED TO CHECK SUGAR, DX 250.00  . meloxicam (MOBIC) 15 MG tablet TAKE 1 TABLET BY MOUTH  EVERY DAY  . metFORMIN (GLUCOPHAGE) 500 MG tablet Take 2 tablets (1,000 mg total) by mouth 2 (two) times daily with a meal.  . nystatin (MYCOSTATIN/NYSTOP) powder Apply topically 2 (two) times daily as needed.  Marland Kitchen omeprazole (PRILOSEC) 20 MG capsule Take 1 capsule (20 mg total) by mouth daily.  . ONE TOUCH ULTRA TEST test strip CHECK BLOOD SUGAR DAILY AND IF BLOOD SUGAR FLUCTUATES. 250.00  . PARoxetine (PAXIL) 20 MG tablet Take 1 tablet (20 mg total) by mouth daily.  . sacubitril-valsartan (ENTRESTO) 24-26 MG Take 1 tablet by mouth 2 (two) times daily.   Review of Systems    Review of Systems  Constitution: Negative for chills, fever and malaise/fatigue.  Cardiovascular: Positive for dyspnea on exertion. Negative for chest pain, leg swelling, near-syncope, orthopnea, palpitations and syncope.  Respiratory: Negative for cough, shortness of breath and wheezing.   Gastrointestinal: Negative for nausea and vomiting.  Neurological: Negative for dizziness, light-headedness and weakness.   All other systems reviewed and are otherwise negative except as noted above.  Physical Exam    VS:  BP 100/60 (BP Location: Left Arm, Patient Position: Sitting, Cuff Size: Normal)   Pulse 93   Temp (!) 97.2 F (36.2 C)   Ht 5\' 10"  (1.778 m)   Wt 240 lb 8 oz (109.1 kg)   SpO2 98%   BMI 34.51 kg/m  , BMI Body mass index is 34.51 kg/m. GEN: Well nourished, overweight, well developed, in no acute distress. HEENT: normal. Neck: Supple, no JVD, carotid bruits, or masses. Cardiac: RRR, no murmurs, rubs, or gallops. No clubbing, cyanosis, edema.  Radials/PT 2+ and equal bilaterally.  Respiratory:  Respirations regular and unlabored, clear to auscultation bilaterally. GI: Soft, nontender, nondistended. MS: No deformity or atrophy. Skin: Warm and dry, no rash. Neuro:  Strength and sensation are intact. Psych: Normal affect.  Accessory Clinical Findings    ECG personally reviewed by me today - SR 93 bpm  with frequent PVC and RBBB  - no acute changes.  Assessment & Plan    1. Chronic systolic failure- Recent echo with EF 30-35%, mildly dilated LV. Prior cardiomyopathy was presumed tachy mediated.  Euvolemic on exam today.  NYHA II-III with DOE.  GDMT: Continue Entresto 24-26mg  BID. This was started  12/08/19, BMET today for monitoring of renal function. Will submit prior auth as he reports pharmacy quoted him a price of $400. He has a free 30 day sample from pharmacy with coupon and was provided with an additional 7 day sample in the office today. If prior auth denied, will apply for patient assistance. Transition Coreg to Metoprolol for optimization of HR as low normal BP precludes up-titration of Coreg.   Anticipate addition of Spironolactone or up-titration of Entresto at next office visit.  Referral previously placed to cardiac rehab, will send staff message to ensure they received.  Recommend low sodium diet, limit to 1.5 L fluid per day.  Due to worsening EF without clear cause, will pursue ischemic evaluation to rule out ICM.  Per Dr. Tyrell Antonio recommendation, will proceed with left and right cardiac catheterization. Risk and benefits discussed with Tim Mccullough today and he is agreeable to proceed. The patient understands that risks include but are not limited to stroke (1 in 1000), death (1 in 70), kidney failure [usually temporary] (1 in 500), bleeding (1 in 200), allergic reaction [possibly serious] (1 in 200), and agrees to proceed.   2. HTN - BP low normal, but asymptomatic with no lightheadedness, dizziness. Continue Entresto 24-26mg  BID. Continue Cardura 1mg  daily. Switch Coreg to Metoprolol, as below. Anticipate this transition will allow for better control of HR and for further up-titration of heart failure therapies. May need to reduce Cardura dose in the future, but he is hesitant due to BPH.  3. HLD - Lipid panel 08/21/19 LDL 76. Continue Omega 3 and Atorvastatin 40mg . If   4.  DM2 - Follows with PCP. Consider addition of dapagliflozin in future for cardioprotective benefit. However, may be cost prohibitive.  5. Bilateral carotid disease - Carotid doppler 07/2017 with mild nonobstructive disease. Lipid well controlled, continue statin.    6. PAC/PVC - He is aware of PVC only when he is resting, overall asymptomatic.  We will stop carvedilol and transition to metoprolol tartrate 25 mg twice daily.  Previous cardiomyopathy was tachy-mediated and goal to optimize HR. This will allow for up titration of beta-blocker and hopefully reduction in heart rate.  7. RBBB - Stable finding. Continue to monitor with EKG.  Disposition: Follow up in 2 week(s) with Dr. Fletcher Anon or APP    Loel Dubonnet, NP 12/22/2019, 8:16 AM

## 2019-12-22 ENCOUNTER — Other Ambulatory Visit: Payer: Self-pay

## 2019-12-22 ENCOUNTER — Other Ambulatory Visit
Admission: RE | Admit: 2019-12-22 | Discharge: 2019-12-22 | Disposition: A | Discharge status: Home / Self Care | Payer: Medicare Other | Source: Ambulatory Visit | Attending: Cardiovascular Disease | Admitting: Cardiovascular Disease

## 2019-12-22 ENCOUNTER — Ambulatory Visit (INDEPENDENT_AMBULATORY_CARE_PROVIDER_SITE_OTHER): Payer: Medicare Other | Admitting: Family

## 2019-12-22 ENCOUNTER — Encounter: Payer: Self-pay | Admitting: Family

## 2019-12-22 VITALS — BP 100/60 | HR 93 | Temp 97.2°F | Ht 70.0 in | Wt 240.5 lb

## 2019-12-22 DIAGNOSIS — E782 Mixed hyperlipidemia: Secondary | ICD-10-CM

## 2019-12-22 DIAGNOSIS — I5042 Chronic combined systolic (congestive) and diastolic (congestive) heart failure: Secondary | ICD-10-CM | POA: Diagnosis not present

## 2019-12-22 DIAGNOSIS — I493 Ventricular premature depolarization: Secondary | ICD-10-CM | POA: Diagnosis not present

## 2019-12-22 DIAGNOSIS — I4510 Unspecified right bundle-branch block: Secondary | ICD-10-CM | POA: Diagnosis not present

## 2019-12-22 DIAGNOSIS — Z20822 Contact with and (suspected) exposure to covid-19: Secondary | ICD-10-CM | POA: Insufficient documentation

## 2019-12-22 DIAGNOSIS — I10 Essential (primary) hypertension: Secondary | ICD-10-CM

## 2019-12-22 DIAGNOSIS — Z01812 Encounter for preprocedural laboratory examination: Secondary | ICD-10-CM | POA: Insufficient documentation

## 2019-12-22 MED ORDER — METOPROLOL TARTRATE 25 MG PO TABS: 25.0000 mg | ORAL_TABLET | Freq: Two times a day (BID) | ORAL | 3 refills | Status: DC

## 2019-12-22 NOTE — Addendum Note (Signed)
Addended by: Loel Dubonnet on: 12/22/2019 10:15 AM   Modules accepted: Orders, SmartSet

## 2019-12-22 NOTE — Patient Instructions (Addendum)
Medication Instructions:  Your physician has recommended you make the following change in your medication:   STOP Coreg  START Metoprolol 25mg  BID  *If you need a refill on your cardiac medications before your next appointment, please call your pharmacy*  Lab Work: Your physician recommends that you return for lab work today: BMET, CBC  CV19 Pre admit testing DRIVE THRU  Please report to the PAT testing site ([facility])  Today after your visit___ time for your DRIVE THRU covid testing that is required prior to your procedure.  Following covid testing, please remain in quarantine. If you must be around others, please wash hands, avoid touching face and wear your mask.    If you have labs (blood work) drawn today and your tests are completely normal, you will receive your results only by: Marland Kitchen MyChart Message (if you have MyChart) OR . A paper copy in the mail If you have any lab test that is abnormal or we need to change your treatment, we will call you to review the results.  Testing/Procedures:    [address], [place] [address] Dept: [phone] Loc: [place]  12/22/2019  You are scheduled for a Cardiac Catheterization on Monday, February 15 with Dr. [name].  1. Please arrive at [facility] at 10:30 AM Free valet parking service is available.   Special note: Every effort is made to have your procedure done on time. Please understand that emergencies sometimes delay scheduled procedures.  2. Diet: Do not eat solid foods after midnight.  The patient may have clear liquids until 5am upon the day of the procedure.  3. Labs: today  4. Medication instructions in preparation for your procedure: Hold metformin the night before and the morning of.   On the morning of your procedure, take your Aspirin and any morning  medicines NOT listed above.  You may use sips of water.  5. Plan for one night stay--bring personal belongings. 6. Bring a current list of your medications and current insurance cards. 7. You MUST have a responsible person to drive you home. 8. Someone MUST be with you the first 24 hours after you arrive home or your discharge will be delayed. 9. Please wear clothes that are easy to get on and off and wear slip-on shoes.  Thank you for allowing [place] to care for you!   -- [facility]    Follow-Up: At [facility], you and your health needs are our priority.  As part of our continuing mission to provide you with exceptional heart care, we have created designated Provider Care Teams.  These Care Teams include your primary Cardiologist (physician) and Advanced Practice Providers (APPs -  Physician Assistants and Nurse Practitioners) who all work together to provide you with the care you need, when you need it.  Your next appointment:  1-2 weeks

## 2019-12-23 DIAGNOSIS — Z23 Encounter for immunization: Secondary | ICD-10-CM | POA: Diagnosis not present

## 2019-12-23 LAB — BASIC METABOLIC PANEL
BUN/Creatinine Ratio: 18 (ref 10–24)
BUN: 21 mg/dL (ref 8–27)
CO2: 23 mmol/L (ref 20–29)
Calcium: 9 mg/dL (ref 8.6–10.2)
Chloride: 101 mmol/L (ref 96–106)
Creatinine, Ser: 1.15 mg/dL (ref 0.76–1.27)
GFR calc Af Amer: 76 mL/min/{1.73_m2} (ref >59)
GFR calc non Af Amer: 65 mL/min/{1.73_m2} (ref >59)
Glucose: 173 mg/dL — ABNORMAL HIGH (ref 65–99)
Potassium: 5.1 mmol/L (ref 3.5–5.2)
Sodium: 138 mmol/L (ref 134–144)

## 2019-12-23 LAB — CBC
Hematocrit: 40.9 % (ref 37.5–51.0)
Hemoglobin: 13.8 g/dL (ref 13.0–17.7)
MCH: 31.1 pg (ref 26.6–33.0)
MCHC: 33.7 g/dL (ref 31.5–35.7)
MCV: 92 fL (ref 79–97)
Platelets: 228 10*3/uL (ref 150–450)
RBC: 4.44 x10E6/uL (ref 4.14–5.80)
RDW: 12.1 % (ref 11.6–15.4)
WBC: 6.7 10*3/uL (ref 3.4–10.8)

## 2019-12-23 LAB — SARS CORONAVIRUS 2 (TAT 6-24 HRS): SARS Coronavirus 2: NEGATIVE

## 2019-12-25 ENCOUNTER — Ambulatory Visit
Admission: RE | Admit: 2019-12-25 | Discharge: 2019-12-25 | Disposition: A | Discharge status: Home / Self Care | Payer: Medicare Other | Attending: Cardiovascular Disease | Admitting: Cardiovascular Disease

## 2019-12-25 ENCOUNTER — Other Ambulatory Visit: Payer: Self-pay

## 2019-12-25 ENCOUNTER — Encounter
Admission: RE | Disposition: A | Discharge status: Home / Self Care | Payer: Self-pay | Source: Home / Self Care | Attending: Cardiovascular Disease

## 2019-12-25 ENCOUNTER — Encounter: Payer: Self-pay | Admitting: Cardiology

## 2019-12-25 DIAGNOSIS — I4510 Unspecified right bundle-branch block: Secondary | ICD-10-CM | POA: Insufficient documentation

## 2019-12-25 DIAGNOSIS — E785 Hyperlipidemia, unspecified: Secondary | ICD-10-CM | POA: Insufficient documentation

## 2019-12-25 DIAGNOSIS — I5042 Chronic combined systolic (congestive) and diastolic (congestive) heart failure: Secondary | ICD-10-CM

## 2019-12-25 DIAGNOSIS — N400 Enlarged prostate without lower urinary tract symptoms: Secondary | ICD-10-CM | POA: Diagnosis not present

## 2019-12-25 DIAGNOSIS — I110 Hypertensive heart disease with heart failure: Secondary | ICD-10-CM | POA: Insufficient documentation

## 2019-12-25 DIAGNOSIS — Z7982 Long term (current) use of aspirin: Secondary | ICD-10-CM | POA: Diagnosis not present

## 2019-12-25 DIAGNOSIS — G4733 Obstructive sleep apnea (adult) (pediatric): Secondary | ICD-10-CM | POA: Diagnosis not present

## 2019-12-25 DIAGNOSIS — I420 Dilated cardiomyopathy: Secondary | ICD-10-CM | POA: Diagnosis not present

## 2019-12-25 DIAGNOSIS — I5022 Chronic systolic (congestive) heart failure: Secondary | ICD-10-CM | POA: Diagnosis not present

## 2019-12-25 DIAGNOSIS — I6523 Occlusion and stenosis of bilateral carotid arteries: Secondary | ICD-10-CM | POA: Diagnosis not present

## 2019-12-25 DIAGNOSIS — I272 Pulmonary hypertension, unspecified: Secondary | ICD-10-CM | POA: Diagnosis not present

## 2019-12-25 DIAGNOSIS — Z7984 Long term (current) use of oral hypoglycemic drugs: Secondary | ICD-10-CM | POA: Insufficient documentation

## 2019-12-25 DIAGNOSIS — Z6834 Body mass index (BMI) 34.0-34.9, adult: Secondary | ICD-10-CM | POA: Diagnosis not present

## 2019-12-25 DIAGNOSIS — Z85820 Personal history of malignant melanoma of skin: Secondary | ICD-10-CM | POA: Diagnosis not present

## 2019-12-25 DIAGNOSIS — R0609 Other forms of dyspnea: Secondary | ICD-10-CM | POA: Diagnosis not present

## 2019-12-25 DIAGNOSIS — E119 Type 2 diabetes mellitus without complications: Secondary | ICD-10-CM | POA: Insufficient documentation

## 2019-12-25 DIAGNOSIS — I509 Heart failure, unspecified: Secondary | ICD-10-CM

## 2019-12-25 DIAGNOSIS — E669 Obesity, unspecified: Secondary | ICD-10-CM | POA: Diagnosis not present

## 2019-12-25 DIAGNOSIS — Z791 Long term (current) use of non-steroidal anti-inflammatories (NSAID): Secondary | ICD-10-CM | POA: Diagnosis not present

## 2019-12-25 DIAGNOSIS — I5020 Unspecified systolic (congestive) heart failure: Secondary | ICD-10-CM | POA: Diagnosis not present

## 2019-12-25 DIAGNOSIS — F329 Major depressive disorder, single episode, unspecified: Secondary | ICD-10-CM | POA: Insufficient documentation

## 2019-12-25 DIAGNOSIS — K219 Gastro-esophageal reflux disease without esophagitis: Secondary | ICD-10-CM | POA: Diagnosis not present

## 2019-12-25 DIAGNOSIS — Q210 Ventricular septal defect: Secondary | ICD-10-CM | POA: Insufficient documentation

## 2019-12-25 DIAGNOSIS — R000 Tachycardia, unspecified: Secondary | ICD-10-CM | POA: Insufficient documentation

## 2019-12-25 HISTORY — PX: RIGHT/LEFT HEART CATH AND CORONARY ANGIOGRAPHY: CATH118266

## 2019-12-25 LAB — GLUCOSE, CAPILLARY: Glucose-Capillary: 139 mg/dL — ABNORMAL HIGH (ref 70–99)

## 2019-12-25 SURGERY — RIGHT/LEFT HEART CATH AND CORONARY ANGIOGRAPHY
Anesthesia: Moderate Sedation

## 2019-12-25 MED ORDER — SODIUM CHLORIDE 0.9% FLUSH: 3.0000 mL | INTRAVENOUS | Status: DC | PRN

## 2019-12-25 MED ORDER — SODIUM CHLORIDE 0.9% FLUSH: 3.0000 mL | Freq: Two times a day (BID) | INTRAVENOUS | Status: DC

## 2019-12-25 MED ORDER — ASPIRIN 81 MG PO CHEW: 81.0000 mg | CHEWABLE_TABLET | ORAL | Status: DC

## 2019-12-25 MED ORDER — HEPARIN SODIUM (PORCINE) 1000 UNIT/ML IJ SOLN
INTRAMUSCULAR | Status: DC | PRN
  Administered 2019-12-25: 5000 [IU] via INTRAVENOUS

## 2019-12-25 MED ORDER — HEPARIN SODIUM (PORCINE) 1000 UNIT/ML IJ SOLN
INTRAMUSCULAR | Status: AC
  Filled 2019-12-25: qty 1

## 2019-12-25 MED ORDER — FENTANYL CITRATE (PF) 100 MCG/2ML IJ SOLN
INTRAMUSCULAR | Status: AC
  Filled 2019-12-25: qty 2

## 2019-12-25 MED ORDER — VERAPAMIL HCL 2.5 MG/ML IV SOLN
INTRAVENOUS | Status: DC | PRN
  Administered 2019-12-25: 2.5 mg via INTRA_ARTERIAL

## 2019-12-25 MED ORDER — VERAPAMIL HCL 2.5 MG/ML IV SOLN
INTRAVENOUS | Status: AC
  Filled 2019-12-25: qty 2

## 2019-12-25 MED ORDER — MIDAZOLAM HCL 2 MG/2ML IJ SOLN
INTRAMUSCULAR | Status: DC | PRN
  Administered 2019-12-25: 1 mg via INTRAVENOUS

## 2019-12-25 MED ORDER — SODIUM CHLORIDE 0.9 % IV SOLN: INTRAVENOUS | Status: DC

## 2019-12-25 MED ORDER — HEPARIN (PORCINE) IN NACL 1000-0.9 UT/500ML-% IV SOLN
INTRAVENOUS | Status: DC | PRN
  Administered 2019-12-25: 500 mL

## 2019-12-25 MED ORDER — SODIUM CHLORIDE 0.9 % IV SOLN: 250.0000 mL | INTRAVENOUS | Status: DC | PRN

## 2019-12-25 MED ORDER — IOHEXOL 300 MG/ML  SOLN
INTRAMUSCULAR | Status: DC | PRN
  Administered 2019-12-25: 50 mL

## 2019-12-25 MED ORDER — MIDAZOLAM HCL 2 MG/2ML IJ SOLN
INTRAMUSCULAR | Status: AC
  Filled 2019-12-25: qty 2

## 2019-12-25 MED ORDER — ONDANSETRON HCL 4 MG/2ML IJ SOLN: 4.0000 mg | Freq: Four times a day (QID) | INTRAMUSCULAR | Status: DC | PRN

## 2019-12-25 MED ORDER — ACETAMINOPHEN 325 MG PO TABS: 650.0000 mg | ORAL_TABLET | ORAL | Status: DC | PRN

## 2019-12-25 MED ORDER — FENTANYL CITRATE (PF) 100 MCG/2ML IJ SOLN
INTRAMUSCULAR | Status: DC | PRN
  Administered 2019-12-25: 25 ug via INTRAVENOUS

## 2019-12-25 SURGICAL SUPPLY — 9 items
CATH BALLN WEDGE 5F 110CM (CATHETERS) ×2 IMPLANT
CATH INFINITI 5FR JK (CATHETERS) ×2 IMPLANT
CATH INFINITI JR4 5F (CATHETERS) ×2 IMPLANT
DEVICE RAD TR BAND REGULAR (VASCULAR PRODUCTS) ×2 IMPLANT
GLIDESHEATH SLEND SS 6F .021 (SHEATH) ×2 IMPLANT
KIT MANI 3VAL PERCEP (MISCELLANEOUS) ×3 IMPLANT
PACK CARDIAC CATH (CUSTOM PROCEDURE TRAY) ×3 IMPLANT
SHEATH GLIDE SLENDER 4/5FR (SHEATH) ×2 IMPLANT
WIRE ROSEN-J .035X260CM (WIRE) ×2 IMPLANT

## 2019-12-25 NOTE — Discharge Instructions (Signed)
Radial Site Care  This sheet gives you information about how to care for yourself after your procedure. Your health care provider may also give you more specific instructions. If you have problems or questions, contact your health care provider. What can I expect after the procedure? After the procedure, it is common to have:  Bruising and tenderness at the catheter insertion area. Follow these instructions at home: Medicines  Take over-the-counter and prescription medicines only as told by your health care provider. Insertion site care  Follow instructions from your health care provider about how to take care of your insertion site. Make sure you: ? Wash your hands with soap and water before you change your bandage (dressing). If soap and water are not available, use hand sanitizer. ? Change your dressing as told by your health care provider. ? Leave stitches (sutures), skin glue, or adhesive strips in place. These skin closures may need to stay in place for 2 weeks or longer. If adhesive strip edges start to loosen and curl up, you may trim the loose edges. Do not remove adhesive strips completely unless your health care provider tells you to do that.  Check your insertion site every day for signs of infection. Check for: ? Redness, swelling, or pain. ? Fluid or blood. ? Pus or a bad smell. ? Warmth.  Do not take baths, swim, or use a hot tub until your health care provider approves.  You may shower 24-48 hours after the procedure, or as directed by your health care provider. ? Remove the dressing and gently wash the site with plain soap and water. ? Pat the area dry with a clean towel. ? Do not rub the site. That could cause bleeding.  Do not apply powder or lotion to the site. Activity   For 24 hours after the procedure, or as directed by your health care provider: ? Do not flex or bend the affected arm. ? Do not push or pull heavy objects with the affected arm. ? Do not  drive yourself home from the hospital or clinic. You may drive 24 hours after the procedure unless your health care provider tells you not to. ? Do not operate machinery or power tools.  Do not lift anything that is heavier than 10 lb (4.5 kg), or the limit that you are told, until your health care provider says that it is safe.  Ask your health care provider when it is okay to: ? Return to work or school. ? Resume usual physical activities or sports. ? Resume sexual activity. General instructions  If the catheter site starts to bleed, raise your arm and put firm pressure on the site. If the bleeding does not stop, get help right away. This is a medical emergency.  If you went home on the same day as your procedure, a responsible adult should be with you for the first 24 hours after you arrive home.  Keep all follow-up visits as told by your health care provider. This is important. Contact a health care provider if:  You have a fever.  You have redness, swelling, or yellow drainage around your insertion site. Get help right away if:  You have unusual pain at the radial site.  The catheter insertion area swells very fast.  The insertion area is bleeding, and the bleeding does not stop when you hold steady pressure on the area.  Your arm or hand becomes pale, cool, tingly, or numb. These symptoms may represent a serious problem   that is an emergency. Do not wait to see if the symptoms will go away. Get medical help right away. Call your local emergency services (911 in the [place] Do not drive yourself to the hospital. Summary  After the procedure, it is common to have bruising and tenderness at the site.  Follow instructions from your health care provider about how to take care of your radial site wound. Check the wound every day for signs of infection.  Do not lift anything that is heavier than 10 lb (4.5 kg), or the limit that you are told, until your health care provider says  that it is safe. This information is not intended to replace advice given to you by your health care provider. Make sure you discuss any questions you have with your health care provider. Document Revised: 12/01/2017 Document Reviewed: 12/01/2017 Elsevier Patient Education  2020 [employer]  

## 2019-12-25 NOTE — Interval H&P Note (Signed)
Cath Lab Visit (complete for each Cath Lab visit)  Clinical Evaluation Leading to the Procedure:   ACS: No.  Non-ACS:    Anginal Classification: CCS III  Anti-ischemic medical therapy: Minimal Therapy (1 class of medications)  Non-Invasive Test Results: No non-invasive testing performed  Prior CABG: No previous CABG      History and Physical Interval Note:  12/25/2019 1:18 PM  Tim Mccullough  has presented today for surgery, with the diagnosis of RT LT Heart Cath   Worsening heart failure  reduced ejection fraction.  The various methods of treatment have been discussed with the patient and family. After consideration of risks, benefits and other options for treatment, the patient has consented to  Procedure(s): RIGHT/LEFT HEART CATH AND CORONARY ANGIOGRAPHY (N/A) as a surgical intervention.  The patient's history has been reviewed, patient examined, no change in status, stable for surgery.  I have reviewed the patient's chart and labs.  Questions were answered to the patient's satisfaction.     [name]

## 2020-01-01 NOTE — Telephone Encounter (Signed)
[facility] fax received notification. No PA is required for Entresto 24-26 mg tablet. Medication is on pt's plan list of covered drugs.

## 2020-01-02 ENCOUNTER — Other Ambulatory Visit: Payer: Self-pay | Admitting: Family Medicine

## 2020-01-02 DIAGNOSIS — L538 Other specified erythematous conditions: Secondary | ICD-10-CM | POA: Diagnosis not present

## 2020-01-02 DIAGNOSIS — L820 Inflamed seborrheic keratosis: Secondary | ICD-10-CM | POA: Diagnosis not present

## 2020-01-02 DIAGNOSIS — Z85820 Personal history of malignant melanoma of skin: Secondary | ICD-10-CM | POA: Diagnosis not present

## 2020-01-02 DIAGNOSIS — L570 Actinic keratosis: Secondary | ICD-10-CM | POA: Diagnosis not present

## 2020-01-02 DIAGNOSIS — Z85828 Personal history of other malignant neoplasm of skin: Secondary | ICD-10-CM | POA: Diagnosis not present

## 2020-01-02 DIAGNOSIS — X32XXXA Exposure to sunlight, initial encounter: Secondary | ICD-10-CM | POA: Diagnosis not present

## 2020-01-02 DIAGNOSIS — L280 Lichen simplex chronicus: Secondary | ICD-10-CM | POA: Diagnosis not present

## 2020-01-02 DIAGNOSIS — H61031 Chondritis of right external ear: Secondary | ICD-10-CM | POA: Diagnosis not present

## 2020-01-02 NOTE — Progress Notes (Signed)
Office Visit    Patient Name: Tim Mccullough Date of Encounter: 01/03/2020  Primary Care Provider:  Tonia Ghent, MD Primary Cardiologist:  [name], MD Electrophysiologist:  None   Chief Complaint    Tim Mccullough is a 69 y.o. male with a hx of chronic systolic HF due to NICM initially diagnosed 2010, HLD, DM2, HTN, frequent PAC, PVC presents today for follow up after cardiac catheterization.    Past Medical History    Past Medical History:  Diagnosis Date  . Abnormal echocardiogram 2/11   Repeated after med mgmt of cardiomyopathy showed EF 40-45%, mildly dilated LV, mild LV hypertrophy, anterolateral and apical hypokinesis, mild diastolic dysfunction.  . Anemia    hx  . Arthritis   . Blood transfusion without reported diagnosis 09/2012  . BPH (benign prostatic hypertrophy) 2004  . Cancer ([place])    melanoma back  . Cardiomyopathy    ? tachycardia-mediated  . Depression   . Diabetes mellitus 11/2006   Type II  . Diverticulosis   . Echocardiogram abnormal 7/10   Mild LVH with a mildly dilated LV.  EF was difficult to assess given poor acoustic windows but appeared moderately decreased.  Mild MR.  Probable small perimembranous VSD, mild LAE  . Esophageal erosions   . GERD (gastroesophageal reflux disease)   . Hip pain   . History of MRI 7/10   Cardiac MRI was done to followup difficult echo.  This showed mild to moderately dilated LV, mild to moderate LAE, EF  33% with global hypokinesis, normal RV size with mild systolic dysfunction.  There was no large VSD evident.  There was no myocardial delayed enhancement so no evidence for infiltrative disease or infarction.  LHC (7/10) showed only luminal irreg. in the coronaries and EF 35%  . Holter monitor, abnormal 7/10   Frequent PAC's and PVC's  Average HR 96  (but he had started Toprol XL).  HR range 71-130  . Hyperlipidemia 04/1998  . Hypertension 1995  . Obesity   . OSA (obstructive sleep apnea)    Not using  CPAP- improved with weight loss  . Pneumonia    HX OF pna  . Sinus tachycardia    Pt says he has been told his heart rate is high since he was in high school.   . Sleep apnea    has cpap; but does not use  . VSD (ventricular septal defect)    Probable small perimembranous   Past Surgical History:  Procedure Laterality Date  . CARDIAC CATHETERIZATION  05/30/2009   Dilated cardiomyopathy.  min VSD.  Hypokin Inf  wall.  Multiple min luminal Irr.  EF 35%  . COLONOSCOPY W/ BIOPSIES  08/24/2006   Divertics, polyps x3, bx negative, repeat 2012 with mild diverticulosis in teh sigmoid colon and 1 polyp in tranverse colon, repeat due 2017  . ESOPHAGOGASTRODUODENOSCOPY  2012   1)Barrett's esoph, 2) Erosive esophagitis, 3) Mild gastritis in the antrum, 4) Duodenitis in the bulb of duodenum, 5) Small hiatal hernia  . INGUINAL HERNIA REPAIR  1985   Left  . INGUINAL HERNIA REPAIR  1999   Right  . KNEE ARTHROSCOPY  10/15/2011   Procedure: ARTHROSCOPY KNEE;  Surgeon: Ninetta Lights, MD;  Location: [facility];  Service: Orthopedics;  Laterality: Right;  right knee scope with lateral meniscectomy, removal loose foreign body, and microfracture technique  . LAMINECTOMY  1987   with discectomy  . RIGHT/LEFT HEART CATH AND CORONARY  ANGIOGRAPHY N/A 12/25/2019   Procedure: RIGHT/LEFT HEART CATH AND CORONARY ANGIOGRAPHY;  Surgeon: Wellington Hampshire, MD;  Location: [facility];  Service: Cardiovascular;  Laterality: N/A;  . ROTATOR CUFF REPAIR  2/11   rt  . TOTAL HIP ARTHROPLASTY  2011   Left  . TOTAL KNEE ARTHROPLASTY  06/01/2012   Procedure: TOTAL KNEE ARTHROPLASTY;  Surgeon: Ninetta Lights, MD;  Location: [facility];  Service: Orthopedics;  Laterality: Right;   Allergies  No Known Allergies  History of Present Illness    Tim Mccullough is a 69 y.o. male with a hx of chronic systolic HF due to NICM initially diagnosed 2010, HLD, DM2, HTN, frequent PAC, PVC  presents today for heart  failure follow up after initiation of Entresto and follow up after cardiac catheterization.  An ICM with systolic heart failure initially dx 2010. Cardiac cath with minor luminal irregularities and no obstructive CAD, EF 35%. At that time noted probable small, hemodynamically insignificant VSD that did not explain his cardiomyopathy. Cardiomyopathy thought to be tachy-mediated. 03/2012 EF 50-55%. Carotid doppler 2017 for carotid bruit with mild bilateral ICA disease and moderate common carotid disease.   At office visit 10/23/19 increased DOE. As his previous echo was in 2013, an updated echocardiogram was ordered. Echo 11/22/19 showed LVEF 30-35%, mildly dilated LV, RV normal size/function, LA mildly dilated, no significant valvular abnormality.    He has been noted to have frequent PVCs which are overall asymptomatic. Will not occasionally when restint.     When seen 12/08/19, Entresto was added for optimization of GDMT.  One 12/22/19 he was transitioned from Coreg to Metoprolol due to frequent PVC with low normal blood pressure.   Underwent right and left heart cath 12/25/19 which showed normal coronary arteries, mildly elevated filling pressure (PCW 25mmHg), mild pulmonary hypertension (37/21 mmHg), and mildly reduced cardiac output at 4.69 with cardiac index 2.09.  His cath site is healing well.  We reviewed his cardiac catheterization report and detail.  We discussed that ischemia is not contributing to his systolic heart failure.  He reports that he still has dyspnea on exertion but no shortness of breath at rest.  He reports no edema, orthopnea, PND.  [facility].  His co-pay after insurance from [place] for 30-day supply is $450.  His co-pay for dapagliflozin 10 mg for 30-day supplies $492.  In discussion with patient he tells me his secondary insurance prefers [facility] and he is going to check with them regarding cost.  Majority of our discussion focused  on it patient assistance paperwork for [employer].  We discussed that he will likely to spend $500 out-of-pocket on medications prior to patient assistance kicking in and he tells me this is feasible.  He is approximately 18 days left of Entresto samples.  He is already utilize the free 30-day card.  Reports systolic blood pressure at home 1 15-1 30.  Reports diastolic typically in the 70s.  Denies lightheadedness, dizziness, near syncope.   EKGs/Labs/Other Studies Reviewed:   The following studies were reviewed today:  Echo 11/22/19 1. Left ventricular ejection fraction, by visual estimation, is 30 to 35%. The left ventricle has moderate to severely decreased function. There is no left ventricular hypertrophy.  2. Left ventricular diastolic function could not be evaluated.  3. Mildly dilated left ventricular internal cavity size.  4. The left ventricle demonstrates global hypokinesis.  5. Global right ventricle has mildly reduced systolic function.The right ventricular size  is normal. Right vetricular wall thickness was not assessed.  6. Left atrial size was mildly dilated.  7. Right atrial size was normal.  8. The mitral valve is normal in structure. No evidence of mitral valve regurgitation.  9. The tricuspid valve is grossly normal. 10. The aortic valve is grossly normal. Aortic valve regurgitation is not visualized. 11. Pulmonic regurgitation not assessed. 12. The pulmonic valve was not well visualized. Pulmonic valve regurgitation not assessed.    Cath 12/25/19 1.  Normal coronary arteries. 2.  Right heart catheterization showed mildly elevated filling pressures (PCW of 14 mmHg), mild pulmonary hypertension (37/21 mmHg) and mildly reduced cardiac output at 4.69 with a cardiac index of 2.09.   Recommendations: Continue medical therapy for nonischemic cardiomyopathy. Consider EP evaluation for treatment of PVCs which might be contributing to worsening cardiomyopathy.   EKG:  EKG is  ordered today.  The ekg ordered today demonstrates SR 88 bpm with RBBB and PVC in pattern of bigeminy.  Recent Labs: 04/26/2019: ALT 21 12/22/2019: BUN 21; Creatinine, Ser 1.15; Hemoglobin 13.8; Platelets 228; Potassium 5.1; Sodium 138  Recent Lipid Panel    Component Value Date/Time   CHOL 142 08/21/2019 0819   CHOL 178 08/28/2016 0806   TRIG 137.0 08/21/2019 0819   HDL 38.70 (L) 08/21/2019 0819   HDL 48 08/28/2016 0806   CHOLHDL 4 08/21/2019 0819   VLDL 27.4 08/21/2019 0819   LDLCALC 76 08/21/2019 0819   LDLCALC 106 (H) 08/28/2016 0806   LDLDIRECT 138.8 06/17/2011 0850    Home Medications   Current Meds  Medication Sig  . aspirin 81 MG tablet Take 81 mg by mouth daily.   Marland Kitchen atorvastatin (LIPITOR) 40 MG tablet TAKE 1 TABLET BY MOUTH EVERY DAY (Patient taking differently: Take 40 mg by mouth daily. )  . doxazosin (CARDURA) 1 MG tablet 1 tablet daily  . fish oil-omega-3 fatty acids 1000 MG capsule Take 1 g by mouth 2 (two) times daily.   Marland Kitchen glimepiride (AMARYL) 1 MG tablet 1 tab in the AM, 1 in the PM (Patient taking differently: Take 1 mg by mouth in the morning and at bedtime. )  . Lancets (ONETOUCH ULTRASOFT) lancets USE DAILY AS NEEDED TO CHECK SUGAR, DX 250.00  . meloxicam (MOBIC) 15 MG tablet TAKE 1 TABLET BY MOUTH EVERY DAY (Patient taking differently: Take 15 mg by mouth daily. )  . metFORMIN (GLUCOPHAGE) 500 MG tablet TAKE 2 TABLETS (1,000 MG TOTAL) BY MOUTH 2 (TWO) TIMES DAILY WITH A MEAL.  . metoprolol tartrate (LOPRESSOR) 25 MG tablet Take 1.5 tablets (37.5 mg total) by mouth 2 (two) times daily.  Marland Kitchen omeprazole (PRILOSEC) 20 MG capsule Take 1 capsule (20 mg total) by mouth daily.  . ONE TOUCH ULTRA TEST test strip CHECK BLOOD SUGAR DAILY AND IF BLOOD SUGAR FLUCTUATES. 250.00  . oxymetazoline (AFRIN) 0.05 % nasal spray Place 1 spray into both nostrils 2 (two) times daily as needed for congestion.  Marland Kitchen PARoxetine (PAXIL) 20 MG tablet Take 1 tablet (20 mg total) by mouth daily.   . sacubitril-valsartan (ENTRESTO) 24-26 MG Take 1 tablet by mouth 2 (two) times daily.  . [DISCONTINUED] metoprolol tartrate (LOPRESSOR) 25 MG tablet Take 1 tablet (25 mg total) by mouth 2 (two) times daily.    Review of Systems      Review of Systems  Constitution: Negative for chills, fever and malaise/fatigue.  Cardiovascular: Positive for dyspnea on exertion and palpitations. Negative for chest pain, leg swelling, near-syncope, orthopnea and  syncope.  Respiratory: Negative for cough, shortness of breath and wheezing.   Gastrointestinal: Negative for nausea and vomiting.  Neurological: Negative for dizziness, light-headedness and weakness.   All other systems reviewed and are otherwise negative except as noted above.  Physical Exam    VS:  BP 100/64 (BP Location: Left Arm, Patient Position: Sitting, Cuff Size: Normal)   Pulse 88   Ht 5\' 10"  (1.778 m)   Wt 238 lb 8 oz (108.2 kg)   SpO2 97%   BMI 34.22 kg/m  , BMI Body mass index is 34.22 kg/m. GEN: Well nourished, well developed, in no acute distress. HEENT: normal. Neck: Supple, no JVD, carotid bruits, or masses. Cardiac: RRR, no murmurs, rubs, or gallops. No clubbing, cyanosis, edema.  Radials/DP/PT 2+ and equal bilaterally.  Respiratory:  Respirations regular and unlabored, clear to auscultation bilaterally. GI: Soft, nontender, nondistended, BS + x 4. MS: No deformity or atrophy. Skin: Warm and dry, no rash. R radial cath site clean, dry, intact. No ecchymosis, erythema, no signs of infection. Neuro:  Strength and sensation are intact. Psych: Normal affect.  Accessory Clinical Findings    ECG personally reviewed by me today - SR 88 bpm with PVC in pattern of bigeminy. - no acute changes.  Assessment & Plan    1. Chronic systolic heart failure/NICM - Initial dx tachymediated cardiomyopathy in 2010. Echo 11/2019 with LVEF 30-35%, mildly dilated LV. Cath 12/2019 with normal coronary arteries.  Euvolemic and well  compensated on exam today.  NYHA II with dyspnea on exertion.  Cardiac rehab referral has been placed and confirm they received.  He was provided with their phone number to contact them regarding scheduling.  GDMT includes beta-blocker and Entresto.  12/22/2019 potassium 5.1-hesitant to add spironolactone at this time.  Dapagliflozin likely cost prohibitive, as below.  Will focus on Entresto.  Primary focus today was obtaining Entresto at affordable price.  [facility]day supply.  Discussed that he would likely will have to pay for this 1 time out of pocket to meet $500 deductible -he is agreeable.  He was told by his secondary insurance they prefer [facility], he is going to check with them regarding the cost.  He was provided patient assistance paperwork today for [employer]. He has 2 weeks of Entresto 24-26mg  samples at home. Anticipate we will likely need to send prescription of Entresto 24-26mg  to pharmacy and he will contact us to let us know what pharmacy. Hesitant to increase dose due to low normal BP. If Delene Loll remains cost prohibitive, transition back to Losartan.  2. PAC/PVC -EKG today shows sinus rhythm with PVC and bigeminy.  Last visit transition from [place] to metoprolol to ability to further uptitrate in the setting of low normal blood pressure.  Increase metoprolol to 37.5 mg twice daily.   Upcoming appointment with Dr. Caryl Comes.  Anticipate frequent PVC may be contributing to cardiomyopathy.  3. HTN -blood pressure well controlled.  Blood pressure remains low normal.  Blood pressure at home routinely 115-130/70s.   4. HLD - Lipid panel 08/21/19 with LDL 76. Continue Omega 3 and Atorvastatin 40mg . LDL goal <100 as recent cardiac cath with normal coronary arteries.   5. DM2 - Follows with his PCP.  Consider dapagliflozin for heart failure therapy.  However, anticipate this will be cost prohibitive and we will focus on getting coverage for Entresto  first.  6. Bilateral carotid disease - Carotid doppler 07/2017 with mild nonobstructive disease. Lipids  well controlled, continue statin. Continue aspirin.   7. RBBB - Stable finding on EKG. Continue to monitor. Reports no lightheadedness, dizziness, near-syncope.  Disposition: Upcoming appointment 01/11/2020 with Dr. Caryl Comes.  Follow up in 1 month(s) with Dr. Fletcher Anon or APP.   Loel Dubonnet, NP 01/03/2020, 9:29 AM

## 2020-01-03 ENCOUNTER — Ambulatory Visit (INDEPENDENT_AMBULATORY_CARE_PROVIDER_SITE_OTHER): Payer: Medicare Other | Admitting: Family

## 2020-01-03 ENCOUNTER — Encounter: Payer: Self-pay | Admitting: Family

## 2020-01-03 ENCOUNTER — Other Ambulatory Visit: Payer: Self-pay

## 2020-01-03 VITALS — BP 100/64 | HR 88 | Ht 70.0 in | Wt 238.5 lb

## 2020-01-03 DIAGNOSIS — I493 Ventricular premature depolarization: Secondary | ICD-10-CM | POA: Diagnosis not present

## 2020-01-03 DIAGNOSIS — I4510 Unspecified right bundle-branch block: Secondary | ICD-10-CM

## 2020-01-03 DIAGNOSIS — E785 Hyperlipidemia, unspecified: Secondary | ICD-10-CM | POA: Diagnosis not present

## 2020-01-03 DIAGNOSIS — I5042 Chronic combined systolic (congestive) and diastolic (congestive) heart failure: Secondary | ICD-10-CM

## 2020-01-03 DIAGNOSIS — I10 Essential (primary) hypertension: Secondary | ICD-10-CM

## 2020-01-03 DIAGNOSIS — I428 Other cardiomyopathies: Secondary | ICD-10-CM

## 2020-01-03 MED ORDER — METOPROLOL TARTRATE 25 MG PO TABS: 37.5000 mg | ORAL_TABLET | Freq: Two times a day (BID) | ORAL | 0 refills | Status: DC

## 2020-01-03 NOTE — Patient Instructions (Addendum)
Medication Instructions:  Your physician has recommended you make the following change in your medication:   Change Metoprolol to 37.5 mg twice daily (1.5 tablet)  *If you need a refill on your cardiac medications before your next appointment, please call your pharmacy*  Lab Work: No lab work today.  If you have labs (blood work) drawn today and your tests are completely normal, you will receive your results only by: Marland Kitchen MyChart Message (if you have MyChart) OR . A paper copy in the mail If you have any lab test that is abnormal or we need to change your treatment, we will call you to review the results.  Testing/Procedures: You had an EKG today. It was stable compared to previous. It showed early beats in the bottom chamber of your heart.  Follow-Up: At [facility], you and your health needs are our priority.  As part of our continuing mission to provide you with exceptional heart care, we have created designated Provider Care Teams.  These Care Teams include your primary Cardiologist (physician) and Advanced Practice Providers (APPs -  Physician Assistants and Nurse Practitioners) who all work together to provide you with the care you need, when you need it.  Your next appointment:   Appointment with Dr. Caryl Comes on 01/11/2020.  Follow up in 1 month in person with Dr. Fletcher Anon.  Other Instructions  Happy belated birthday!!!  We will give you patient assistance paperwork for Entresto.  Use up the samples we provided.  [facility] with [facility] regarding your cost of Delene Loll since your secondary plan prefers that pharmacy. Let us know via phone or MyChart what they say the cost will be.  May have to pay for 1 prescription for Entresto out of pocket. Medicare often requires you meet a $500 out of pocket medication cost prior to letting patient assistance kick in.  If we find this medication is still too expensive, we will switch back to Losartan. We really appreciate your patience!  We  confirmed cardiac rehab has received your referral on 12/25/19. They are in the process of calling their referrals. Their phone number is [phone] if you wish to call them.

## 2020-01-04 ENCOUNTER — Encounter: Payer: Medicare Other | Attending: Cardiovascular Disease | Admitting: *Deleted

## 2020-01-04 DIAGNOSIS — I5022 Chronic systolic (congestive) heart failure: Secondary | ICD-10-CM

## 2020-01-04 NOTE — Progress Notes (Signed)
Initial orientation phone call completed. Diagnosis can be found in [facility] 1/29. EP orientation scheduled for 3/1 at 9:30

## 2020-01-05 ENCOUNTER — Ambulatory Visit: Payer: Medicare Other | Admitting: Family

## 2020-01-08 ENCOUNTER — Other Ambulatory Visit: Payer: Self-pay

## 2020-01-08 ENCOUNTER — Encounter: Payer: Medicare Other | Attending: Cardiovascular Disease | Admitting: *Deleted

## 2020-01-08 VITALS — Ht 70.4 in | Wt 241.7 lb

## 2020-01-08 DIAGNOSIS — I5022 Chronic systolic (congestive) heart failure: Secondary | ICD-10-CM | POA: Diagnosis not present

## 2020-01-08 DIAGNOSIS — I110 Hypertensive heart disease with heart failure: Secondary | ICD-10-CM | POA: Insufficient documentation

## 2020-01-08 DIAGNOSIS — E119 Type 2 diabetes mellitus without complications: Secondary | ICD-10-CM | POA: Diagnosis not present

## 2020-01-08 DIAGNOSIS — Z87891 Personal history of nicotine dependence: Secondary | ICD-10-CM | POA: Insufficient documentation

## 2020-01-08 NOTE — Progress Notes (Signed)
Cardiac Individual Treatment Plan  Patient Details  Name: Tim Mccullough MRN: [id number] Date of Birth: 05/05/1951 Referring Provider:     Cardiac Rehab from 01/08/2020 in [facility] Cardiac and Pulmonary Rehab  Referring Provider  [name] MD      Initial Encounter Date:    Cardiac Rehab from 01/08/2020 in [facility] Cardiac and Pulmonary Rehab  Date  01/08/20      Visit Diagnosis: Heart failure, chronic systolic ([place])  Patient's Home Medications on Admission:  Current Outpatient Medications:  .  aspirin 81 MG tablet, Take 81 mg by mouth daily. , Disp: , Rfl:  .  atorvastatin (LIPITOR) 40 MG tablet, TAKE 1 TABLET BY MOUTH EVERY DAY (Patient taking differently: Take 40 mg by mouth daily. ), Disp: 90 tablet, Rfl: 3 .  doxazosin (CARDURA) 1 MG tablet, 1 tablet daily, Disp: 90 tablet, Rfl: 3 .  fish oil-omega-3 fatty acids 1000 MG capsule, Take 1 g by mouth 2 (two) times daily. , Disp: , Rfl:  .  glimepiride (AMARYL) 1 MG tablet, 1 tab in the AM, 1 in the PM (Patient taking differently: Take 1 mg by mouth in the morning and at bedtime. ), Disp: , Rfl:  .  Lancets (ONETOUCH ULTRASOFT) lancets, USE DAILY AS NEEDED TO CHECK SUGAR, DX 250.00, Disp: 100 each, Rfl: 2 .  meloxicam (MOBIC) 15 MG tablet, TAKE 1 TABLET BY MOUTH EVERY DAY (Patient taking differently: Take 15 mg by mouth daily. ), Disp: 90 tablet, Rfl: 3 .  metFORMIN (GLUCOPHAGE) 500 MG tablet, TAKE 2 TABLETS (1,000 MG TOTAL) BY MOUTH 2 (TWO) TIMES DAILY WITH A MEAL., Disp: 360 tablet, Rfl: 3 .  metoprolol tartrate (LOPRESSOR) 25 MG tablet, Take 1.5 tablets (37.5 mg total) by mouth 2 (two) times daily., Disp: 270 tablet, Rfl: 0 .  omeprazole (PRILOSEC) 20 MG capsule, Take 1 capsule (20 mg total) by mouth daily., Disp: 30 capsule, Rfl: 11 .  ONE TOUCH ULTRA TEST test strip, CHECK BLOOD SUGAR DAILY AND IF BLOOD SUGAR FLUCTUATES. 250.00, Disp: 100 each, Rfl: 11 .  oxymetazoline (AFRIN) 0.05 % nasal spray, Place 1 spray into both nostrils 2  (two) times daily as needed for congestion., Disp: , Rfl:  .  PARoxetine (PAXIL) 20 MG tablet, Take 1 tablet (20 mg total) by mouth daily., Disp: 90 tablet, Rfl: 3 .  sacubitril-valsartan (ENTRESTO) 24-26 MG, Take 1 tablet by mouth 2 (two) times daily., Disp: 60 tablet, Rfl: 3  Past Medical History: Past Medical History:  Diagnosis Date  . Abnormal echocardiogram 2/11   Repeated after med mgmt of cardiomyopathy showed EF 40-45%, mildly dilated LV, mild LV hypertrophy, anterolateral and apical hypokinesis, mild diastolic dysfunction.  . Anemia    hx  . Arthritis   . Blood transfusion without reported diagnosis 09/2012  . BPH (benign prostatic hypertrophy) 2004  . Cancer ([place])    melanoma back  . Cardiomyopathy    ? tachycardia-mediated  . Depression   . Diabetes mellitus 11/2006   Type II  . Diverticulosis   . Echocardiogram abnormal 7/10   Mild LVH with a mildly dilated LV.  EF was difficult to assess given poor acoustic windows but appeared moderately decreased.  Mild MR.  Probable small perimembranous VSD, mild LAE  . Esophageal erosions   . GERD (gastroesophageal reflux disease)   . Hip pain   . History of MRI 7/10   Cardiac MRI was done to followup difficult echo.  This showed mild to moderately dilated LV, mild  to moderate LAE, EF  33% with global hypokinesis, normal RV size with mild systolic dysfunction.  There was no large VSD evident.  There was no myocardial delayed enhancement so no evidence for infiltrative disease or infarction.  LHC (7/10) showed only luminal irreg. in the coronaries and EF 35%  . Holter monitor, abnormal 7/10   Frequent PAC's and PVC's  Average HR 96  (but he had started Toprol XL).  HR range 71-130  . Hyperlipidemia 04/1998  . Hypertension 1995  . Obesity   . OSA (obstructive sleep apnea)    Not using CPAP- improved with weight loss  . Pneumonia    HX OF pna  . Sinus tachycardia    Pt says he has been told his heart rate is high since he was in  high school.   . Sleep apnea    has cpap; but does not use  . VSD (ventricular septal defect)    Probable small perimembranous    Tobacco Use: Social History   Tobacco Use  Smoking Status Former Smoker  . Years: 20.00  . Types: Cigarettes  . Quit date: 11/09/1998  . Years since quitting: 21.1  Smokeless Tobacco Former Systems developer  . Quit date: 06/03/2007  Tobacco Comment   no alcohol since nov 12    Labs: Recent Review Flowsheet Data    Labs for ITP Cardiac and Pulmonary Rehab Latest Ref Rng & Units 10/20/2018 01/20/2019 04/26/2019 08/21/2019 11/28/2019   Cholestrol 0 - 200 mg/dL - - 246(H) 142 -   LDLCALC 0 - 99 mg/dL - - 169(H) 76 -   LDLDIRECT mg/dL - - - - -   HDL >39.00 mg/dL - - 43.40 38.70(L) -   Trlycerides 0.0 - 149.0 mg/dL - - 172.0(H) 137.0 -   Hemoglobin A1c 4.6 - 6.5 % 6.4(A) 6.9(A) 7.0(H) 7.0(H) 6.8(H)   PHART 7.350 - 7.450 - - - - -   PCO2ART 35.0 - 45.0 mmHg - - - - -   HCO3 20.0 - 24.0 mEq/L - - - - -   TCO2 0 - 100 mmol/L - - - - -   O2SAT % - - - - -       Exercise Target Goals: Exercise Program Goal: Individual exercise prescription set using results from initial 6 min walk test and THRR while considering  patient's activity barriers and safety.   Exercise Prescription Goal: Initial exercise prescription builds to 30-45 minutes a day of aerobic activity, 2-3 days per week.  Home exercise guidelines will be given to patient during program as part of exercise prescription that the participant will acknowledge.  Activity Barriers & Risk Stratification: Activity Barriers & Cardiac Risk Stratification - 01/08/20 1102      Activity Barriers & Cardiac Risk Stratification   Activity Barriers  Joint Problems;Back Problems;Left Hip Replacement;Right Knee Replacement;Deconditioning;Muscular Weakness    Cardiac Risk Stratification  High       6 Minute Walk: 6 Minute Walk    Row Name 01/08/20 1101         6 Minute Walk   Phase  Initial     Distance  1163 feet      Walk Time  6 minutes     # of Rest Breaks  0     MPH  2.2     METS  2.71     RPE  11     VO2 Peak  9.49     Symptoms  Yes (comment)  Comments  Left hip starting to be bothersome at end 6/10     Resting HR  91 bpm     Resting BP  126/70     Resting Oxygen Saturation   96 %     Exercise Oxygen Saturation  during 6 min walk  93 %     Max Ex. HR  123 bpm     Max Ex. BP  142/70     2 Minute Post BP  130/60        Oxygen Initial Assessment:   Oxygen Re-Evaluation:   Oxygen Discharge (Final Oxygen Re-Evaluation):   Initial Exercise Prescription: Initial Exercise Prescription - 01/08/20 1100      Date of Initial Exercise RX and Referring Provider   Date  01/08/20    Referring Provider  [name] MD      Treadmill   MPH  2.2    Grade  0    Minutes  15    METs  2.68      Recumbant Elliptical   Level  1    RPM  50    Minutes  15    METs  2.7      Elliptical   Level  1    Speed  2.5    Minutes  15      REL-XR   Level  2    Speed  50    Minutes  15    METs  2.7      Prescription Details   Frequency (times per week)  3    Duration  Progress to 30 minutes of continuous aerobic without signs/symptoms of physical distress      Intensity   THRR 40-80% of Max Heartrate  115-139    Ratings of Perceived Exertion  11-13    Perceived Dyspnea  0-4      Progression   Progression  Continue to progress workloads to maintain intensity without signs/symptoms of physical distress.      Resistance Training   Training Prescription  Yes    Weight  4 lb       Perform Capillary Blood Glucose checks as needed.  Exercise Prescription Changes: Exercise Prescription Changes    Row Name 01/08/20 1100             Response to Exercise   Blood Pressure (Admit)  126/70       Blood Pressure (Exercise)  142/74       Blood Pressure (Exit)  130/60       Heart Rate (Admit)  97 bpm       Heart Rate (Exercise)  123 bpm       Heart Rate (Exit)  94 bpm        Oxygen Saturation (Admit)  96 %       Oxygen Saturation (Exercise)  93 %       Rating of Perceived Exertion (Exercise)  11       Perceived Dyspnea (Exercise)  0       Symptoms  hip bothersome at end 6/10       Comments  walk test results          Exercise Comments:   Exercise Goals and Review: Exercise Goals    Row Name 01/08/20 1255             Exercise Goals   Increase Physical Activity  Yes       Intervention  Provide advice, education, support and counseling about  physical activity/exercise needs.;Develop an individualized exercise prescription for aerobic and resistive training based on initial evaluation findings, risk stratification, comorbidities and participant's personal goals.       Expected Outcomes  Short Term: Attend rehab on a regular basis to increase amount of physical activity.;Long Term: Add in home exercise to make exercise part of routine and to increase amount of physical activity.;Long Term: Exercising regularly at least 3-5 days a week.       Increase Strength and Stamina  Yes       Intervention  Provide advice, education, support and counseling about physical activity/exercise needs.;Develop an individualized exercise prescription for aerobic and resistive training based on initial evaluation findings, risk stratification, comorbidities and participant's personal goals.       Expected Outcomes  Short Term: Increase workloads from initial exercise prescription for resistance, speed, and METs.;Short Term: Perform resistance training exercises routinely during rehab and add in resistance training at home;Long Term: Improve cardiorespiratory fitness, muscular endurance and strength as measured by increased METs and functional capacity (6MWT)       Able to understand and use rate of perceived exertion (RPE) scale  Yes       Intervention  Provide education and explanation on how to use RPE scale       Expected Outcomes  Short Term: Able to use RPE daily in rehab to  express subjective intensity level;Long Term:  Able to use RPE to guide intensity level when exercising independently       Able to understand and use Dyspnea scale  Yes       Intervention  Provide education and explanation on how to use Dyspnea scale       Expected Outcomes  Short Term: Able to use Dyspnea scale daily in rehab to express subjective sense of shortness of breath during exertion;Long Term: Able to use Dyspnea scale to guide intensity level when exercising independently       Knowledge and understanding of Target Heart Rate Range (THRR)  Yes       Intervention  Provide education and explanation of THRR including how the numbers were predicted and where they are located for reference       Expected Outcomes  Short Term: Able to use daily as guideline for intensity in rehab;Short Term: Able to state/look up THRR;Long Term: Able to use THRR to govern intensity when exercising independently       Able to check pulse independently  Yes       Intervention  Provide education and demonstration on how to check pulse in carotid and radial arteries.;Review the importance of being able to check your own pulse for safety during independent exercise       Expected Outcomes  Short Term: Able to explain why pulse checking is important during independent exercise;Long Term: Able to check pulse independently and accurately       Understanding of Exercise Prescription  Yes       Intervention  Provide education, explanation, and written materials on patient's individual exercise prescription       Expected Outcomes  Short Term: Able to explain program exercise prescription;Long Term: Able to explain home exercise prescription to exercise independently          Exercise Goals Re-Evaluation :   Discharge Exercise Prescription (Final Exercise Prescription Changes): Exercise Prescription Changes - 01/08/20 1100      Response to Exercise   Blood Pressure (Admit)  126/70    Blood Pressure (Exercise)   142/74  Blood Pressure (Exit)  130/60    Heart Rate (Admit)  97 bpm    Heart Rate (Exercise)  123 bpm    Heart Rate (Exit)  94 bpm    Oxygen Saturation (Admit)  96 %    Oxygen Saturation (Exercise)  93 %    Rating of Perceived Exertion (Exercise)  11    Perceived Dyspnea (Exercise)  0    Symptoms  hip bothersome at end 6/10    Comments  walk test results       Nutrition:  Target Goals: Understanding of nutrition guidelines, daily intake of sodium <1562m, cholesterol <2076m calories 30% from fat and 7% or less from saturated fats, daily to have 5 or more servings of fruits and vegetables.  Biometrics: Pre Biometrics - 01/08/20 1257      Pre Biometrics   Height  5' 10.4" (1.788 m)    Weight  241 lb 11.2 oz (109.6 kg)    BMI (Calculated)  34.29    Single Leg Stand  26.06 seconds        Nutrition Therapy Plan and Nutrition Goals:   Nutrition Assessments: Nutrition Assessments - 01/08/20 1259      MEDFICTS Scores   Pre Score  72       Nutrition Goals Re-Evaluation:   Nutrition Goals Discharge (Final Nutrition Goals Re-Evaluation):   Psychosocial: Target Goals: Acknowledge presence or absence of significant depression and/or stress, maximize coping skills, provide positive support system. Participant is able to verbalize types and ability to use techniques and skills needed for reducing stress and depression.   Initial Review & Psychosocial Screening: Initial Psych Review & Screening - 01/04/20 1040      Initial Review   Current issues with  Current Stress Concerns    Source of Stress Concerns  Chronic Illness      Family Dynamics   Good Support System?  Yes   daughter, grandaughters     Barriers   Psychosocial barriers to participate in program  There are no identifiable barriers or psychosocial needs.;The patient should benefit from training in stress management and relaxation.      Screening Interventions   Interventions  Provide feedback about the  scores to participant;Encouraged to exercise;To provide support and resources with identified psychosocial needs    Expected Outcomes  Short Term goal: Utilizing psychosocial counselor, staff and physician to assist with identification of specific Stressors or current issues interfering with healing process. Setting desired goal for each stressor or current issue identified.;Long Term Goal: Stressors or current issues are controlled or eliminated.;Short Term goal: Identification and review with participant of any Quality of Life or Depression concerns found by scoring the questionnaire.;Long Term goal: The participant improves quality of Life and PHQ9 Scores as seen by post scores and/or verbalization of changes       Quality of Life Scores:  Quality of Life - 01/08/20 1258      Quality of Life   Select  Quality of Life      Quality of Life Scores   Health/Function Pre  17.03 %    Socioeconomic Pre  20.79 %    Psych/Spiritual Pre  20.71 %    Family Pre  27 %    GLOBAL Pre  19.82 %      Scores of 19 and below usually indicate a poorer quality of life in these areas.  A difference of  2-3 points is a clinically meaningful difference.  A difference of 2-3 points in the  total score of the Quality of Life Index has been associated with significant improvement in overall quality of life, self-image, physical symptoms, and general health in studies assessing change in quality of life.  PHQ-9: Recent Review Flowsheet Data    Depression screen [facility] 2/9 01/08/2020 04/26/2019 04/08/2018 08/20/2015   Decreased Interest 1 0 0 0   Down, Depressed, Hopeless 0 0 0 0   PHQ - 2 Score 1 0 0 0   Altered sleeping 1 0 0 -   Tired, decreased energy 2 0 0 -   Change in appetite 1 0 0 -   Feeling bad or failure about yourself  0 0 0 -   Trouble concentrating 0 0 0 -   Moving slowly or fidgety/restless 0 0 0 -   Suicidal thoughts 0 0 0 -   PHQ-9 Score 5 0 0 -   Difficult doing work/chores Somewhat difficult Not  difficult at all Not difficult at all -     Interpretation of Total Score  Total Score Depression Severity:  1-4 = Minimal depression, 5-9 = Mild depression, 10-14 = Moderate depression, 15-19 = Moderately severe depression, 20-27 = Severe depression   Psychosocial Evaluation and Intervention: Psychosocial Evaluation - 01/04/20 1044      Psychosocial Evaluation & Interventions   Comments  Casimir reports doing well. His daughter and her two daughters live with him. The issue causing him the most stress right now is the cost of Entresto. He knows this medicine will help him a lot, it is just hard coming up with the additional $400-$500 a month. He is working with his doctor's office and pharmacy to find a plan that is more affordable. This issue currently has caused some sleep issues, but he knows it will all work out in the end. He is looking forward to coming to cardiac rehab because he really wants to work hard to feel better and have the best quality of life.    Expected Outcomes  Short: attend [facility] for exercise and education. Long: develope positive self care habits    Continue Psychosocial Services   Follow up required by staff       Psychosocial Re-Evaluation:   Psychosocial Discharge (Final Psychosocial Re-Evaluation):   Vocational Rehabilitation: Provide vocational rehab assistance to qualifying candidates.   Vocational Rehab Evaluation & Intervention: Vocational Rehab - 01/04/20 1037      Initial Vocational Rehab Evaluation & Intervention   Assessment shows need for Vocational Rehabilitation  No       Education: Education Goals: Education classes will be provided on a variety of topics geared toward better understanding of heart health and risk factor modification. Participant will state understanding/return demonstration of topics presented as noted by education test scores.  Learning Barriers/Preferences: Learning Barriers/Preferences - 01/04/20 1036       Learning Barriers/Preferences   Learning Barriers  None    Learning Preferences  None       Education Topics:  AED/CPR: - Group verbal and written instruction with the use of models to demonstrate the basic use of the AED with the basic ABC's of resuscitation.   General Nutrition Guidelines/Fats and Fiber: -Group instruction provided by verbal, written material, models and posters to present the general guidelines for heart healthy nutrition. Gives an explanation and review of dietary fats and fiber.   Controlling Sodium/Reading Food Labels: -Group verbal and written material supporting the discussion of sodium use in heart healthy nutrition. Review and explanation with models, verbal and written  materials for utilization of the food label.   Exercise Physiology & General Exercise Guidelines: - Group verbal and written instruction with models to review the exercise physiology of the cardiovascular system and associated critical values. Provides general exercise guidelines with specific guidelines to those with heart or lung disease.    Aerobic Exercise & Resistance Training: - Gives group verbal and written instruction on the various components of exercise. Focuses on aerobic and resistive training programs and the benefits of this training and how to safely progress through these programs..   Flexibility, Balance, Mind/Body Relaxation: Provides group verbal/written instruction on the benefits of flexibility and balance training, including mind/body exercise modes such as yoga, pilates and tai chi.  Demonstration and skill practice provided.   Stress and Anxiety: - Provides group verbal and written instruction about the health risks of elevated stress and causes of high stress.  Discuss the correlation between heart/lung disease and anxiety and treatment options. Review healthy ways to manage with stress and anxiety.   Depression: - Provides group verbal and written instruction  on the correlation between heart/lung disease and depressed mood, treatment options, and the stigmas associated with seeking treatment.   Anatomy & Physiology of the Heart: - Group verbal and written instruction and models provide basic cardiac anatomy and physiology, with the coronary electrical and arterial systems. Review of Valvular disease and Heart Failure   Cardiac Procedures: - Group verbal and written instruction to review commonly prescribed medications for heart disease. Reviews the medication, class of the drug, and side effects. Includes the steps to properly store meds and maintain the prescription regimen. (beta blockers and nitrates)   Cardiac Medications I: - Group verbal and written instruction to review commonly prescribed medications for heart disease. Reviews the medication, class of the drug, and side effects. Includes the steps to properly store meds and maintain the prescription regimen.   Cardiac Medications II: -Group verbal and written instruction to review commonly prescribed medications for heart disease. Reviews the medication, class of the drug, and side effects. (all other drug classes)    Go Sex-Intimacy & Heart Disease, Get SMART - Goal Setting: - Group verbal and written instruction through game format to discuss heart disease and the return to sexual intimacy. Provides group verbal and written material to discuss and apply goal setting through the application of the S.M.A.R.T. Method.   Other Matters of the Heart: - Provides group verbal, written materials and models to describe Stable Angina and Peripheral Artery. Includes description of the disease process and treatment options available to the cardiac patient.   Exercise & Equipment Safety: - Individual verbal instruction and demonstration of equipment use and safety with use of the equipment.   Cardiac Rehab from 01/08/2020 in [facility] Cardiac and Pulmonary Rehab  Date  01/08/20  Educator  [facility]   Instruction Review Code  1- Verbalizes Understanding      Infection Prevention: - Provides verbal and written material to individual with discussion of infection control including proper hand washing and proper equipment cleaning during exercise session.   Cardiac Rehab from 01/08/2020 in [facility] Cardiac and Pulmonary Rehab  Date  01/08/20  Educator  [facility]  Instruction Review Code  1- Verbalizes Understanding      Falls Prevention: - Provides verbal and written material to individual with discussion of falls prevention and safety.   Cardiac Rehab from 01/08/2020 in [facility] Cardiac and Pulmonary Rehab  Date  01/08/20  Educator  [facility]  Instruction Review Code  1- [employer]  Understanding      Diabetes: - Individual verbal and written instruction to review signs/symptoms of diabetes, desired ranges of glucose level fasting, after meals and with exercise. Acknowledge that pre and post exercise glucose checks will be done for 3 sessions at entry of program.   Cardiac Rehab from 01/08/2020 in [facility] Cardiac and Pulmonary Rehab  Date  01/04/20  Educator  [facility]  Instruction Review Code  1- Verbalizes Understanding      Know Your Numbers and Risk Factors: -Group verbal and written instruction about important numbers in your health.  Discussion of what are risk factors and how they play a role in the disease process.  Review of Cholesterol, Blood Pressure, Diabetes, and BMI and the role they play in your overall health.   Sleep Hygiene: -Provides group verbal and written instruction about how sleep can affect your health.  Define sleep hygiene, discuss sleep cycles and impact of sleep habits. Review good sleep hygiene tips.    Other: -Provides group and verbal instruction on various topics (see comments)   Knowledge Questionnaire Score: Knowledge Questionnaire Score - 01/08/20 1259      Knowledge Questionnaire Score   Pre Score  20/26 Education Focus: Depression, Angina, Nutrition, Exercise        Core Components/Risk Factors/Patient Goals at Admission: Personal Goals and Risk Factors at Admission - 01/08/20 1259      Core Components/Risk Factors/Patient Goals on Admission    Weight Management  Yes;Obesity;Weight Loss    Intervention  Weight Management: Develop a combined nutrition and exercise program designed to reach desired caloric intake, while maintaining appropriate intake of nutrient and fiber, sodium and fats, and appropriate energy expenditure required for the weight goal.;Weight Management: Provide education and appropriate resources to help participant work on and attain dietary goals.;Weight Management/Obesity: Establish reasonable short term and long term weight goals.;Obesity: Provide education and appropriate resources to help participant work on and attain dietary goals.    Admit Weight  241 lb 11.2 oz (109.6 kg)    Goal Weight: Short Term  235 lb (106.6 kg)    Goal Weight: Long Term  230 lb (104.3 kg)    Expected Outcomes  Short Term: Continue to assess and modify interventions until short term weight is achieved;Long Term: Adherence to nutrition and physical activity/exercise program aimed toward attainment of established weight goal;Weight Loss: Understanding of general recommendations for a balanced deficit meal plan, which promotes 1-2 lb weight loss per week and includes a negative energy balance of [phone] kcal/d;Understanding recommendations for meals to include 15-35% energy as protein, 25-35% energy from fat, 35-60% energy from carbohydrates, less than 27m of dietary cholesterol, 20-35 gm of total fiber daily;Understanding of distribution of calorie intake throughout the day with the consumption of 4-5 meals/snacks    Diabetes  Yes    Intervention  Provide education about signs/symptoms and action to take for hypo/hyperglycemia.;Provide education about proper nutrition, including hydration, and aerobic/resistive exercise prescription along with prescribed  medications to achieve blood glucose in normal ranges: Fasting glucose 65-99 mg/dL    Expected Outcomes  Short Term: Participant verbalizes understanding of the signs/symptoms and immediate care of hyper/hypoglycemia, proper foot care and importance of medication, aerobic/resistive exercise and nutrition plan for blood glucose control.;Long Term: Attainment of HbA1C < 7%.    Lipids  Yes    Intervention  Provide education and support for participant on nutrition & aerobic/resistive exercise along with prescribed medications to achieve LDL <746m HDL >402m   Expected Outcomes  Short Term: Participant states understanding of desired cholesterol values and is compliant with medications prescribed. Participant is following exercise prescription and nutrition guidelines.;Long Term: Cholesterol controlled with medications as prescribed, with individualized exercise RX and with personalized nutrition plan. Value goals: LDL < 42m, HDL > 40 mg.       Core Components/Risk Factors/Patient Goals Review:    Core Components/Risk Factors/Patient Goals at Discharge (Final Review):    ITP Comments: ITP Comments    Row Name 01/04/20 1048 01/08/20 1100         ITP Comments  Initial orientation phone call completed. [facility] EP orientation scheduled for 3/1 at 9:30  Completed 6MWT and gym orientation.  Initial ITP created and sent for review to Dr. MEmily Filbert Medical Director.         Comments: Initial ITP

## 2020-01-08 NOTE — Patient Instructions (Signed)
Patient Instructions  Patient Details  Name: Tim Mccullough MRN: [id number] Date of Birth: 04/26/1951 Referring Provider:  Wellington Hampshire, MD  Below are your personal goals for exercise, nutrition, and risk factors. Our goal is to help you stay on track towards obtaining and maintaining these goals. We will be discussing your progress on these goals with you throughout the program.  Initial Exercise Prescription: Initial Exercise Prescription - 01/08/20 1100      Date of Initial Exercise RX and Referring Provider   Date  01/08/20    Referring Provider  [name] MD      Treadmill   MPH  2.2    Grade  0    Minutes  15    METs  2.68      Recumbant Elliptical   Level  1    RPM  50    Minutes  15    METs  2.7      Elliptical   Level  1    Speed  2.5    Minutes  15      REL-XR   Level  2    Speed  50    Minutes  15    METs  2.7      Prescription Details   Frequency (times per week)  3    Duration  Progress to 30 minutes of continuous aerobic without signs/symptoms of physical distress      Intensity   THRR 40-80% of Max Heartrate  115-139    Ratings of Perceived Exertion  11-13    Perceived Dyspnea  0-4      Progression   Progression  Continue to progress workloads to maintain intensity without signs/symptoms of physical distress.      Resistance Training   Training Prescription  Yes    Weight  4 lb       Exercise Goals: Frequency: Be able to perform aerobic exercise two to three times per week in program working toward 2-5 days per week of home exercise.  Intensity: Work with a perceived exertion of 11 (fairly light) - 15 (hard) while following your exercise prescription.  We will make changes to your prescription with you as you progress through the program.   Duration: Be able to do 30 to 45 minutes of continuous aerobic exercise in addition to a 5 minute warm-up and a 5 minute cool-down routine.   Nutrition Goals: Your personal nutrition goals  will be established when you do your nutrition analysis with the dietician.  The following are general nutrition guidelines to follow: Cholesterol < 200mg /day Sodium < 1500mg /day Fiber: Men over 50 yrs - 30 grams per day  Personal Goals: Personal Goals and Risk Factors at Admission - 01/08/20 1259      Core Components/Risk Factors/Patient Goals on Admission    Weight Management  Yes;Obesity;Weight Loss    Intervention  Weight Management: Develop a combined nutrition and exercise program designed to reach desired caloric intake, while maintaining appropriate intake of nutrient and fiber, sodium and fats, and appropriate energy expenditure required for the weight goal.;Weight Management: Provide education and appropriate resources to help participant work on and attain dietary goals.;Weight Management/Obesity: Establish reasonable short term and long term weight goals.;Obesity: Provide education and appropriate resources to help participant work on and attain dietary goals.    Admit Weight  241 lb 11.2 oz (109.6 kg)    Goal Weight: Short Term  235 lb (106.6 kg)    Goal Weight: Long Term  230 lb (104.3 kg)    Expected Outcomes  Short Term: Continue to assess and modify interventions until short term weight is achieved;Long Term: Adherence to nutrition and physical activity/exercise program aimed toward attainment of established weight goal;Weight Loss: Understanding of general recommendations for a balanced deficit meal plan, which promotes 1-2 lb weight loss per week and includes a negative energy balance of [phone] kcal/d;Understanding recommendations for meals to include 15-35% energy as protein, 25-35% energy from fat, 35-60% energy from carbohydrates, less than 200mg  of dietary cholesterol, 20-35 gm of total fiber daily;Understanding of distribution of calorie intake throughout the day with the consumption of 4-5 meals/snacks    Diabetes  Yes    Intervention  Provide education about  signs/symptoms and action to take for hypo/hyperglycemia.;Provide education about proper nutrition, including hydration, and aerobic/resistive exercise prescription along with prescribed medications to achieve blood glucose in normal ranges: Fasting glucose 65-99 mg/dL    Expected Outcomes  Short Term: Participant verbalizes understanding of the signs/symptoms and immediate care of hyper/hypoglycemia, proper foot care and importance of medication, aerobic/resistive exercise and nutrition plan for blood glucose control.;Long Term: Attainment of HbA1C < 7%.    Lipids  Yes    Intervention  Provide education and support for participant on nutrition & aerobic/resistive exercise along with prescribed medications to achieve LDL 70mg , HDL >40mg .    Expected Outcomes  Short Term: Participant states understanding of desired cholesterol values and is compliant with medications prescribed. Participant is following exercise prescription and nutrition guidelines.;Long Term: Cholesterol controlled with medications as prescribed, with individualized exercise RX and with personalized nutrition plan. Value goals: LDL < 70mg , HDL > 40 mg.       Tobacco Use Initial Evaluation: Social History   Tobacco Use  Smoking Status Former Smoker  . Years: 20.00  . Types: Cigarettes  . Quit date: 11/09/1998  . Years since quitting: 21.1  Smokeless Tobacco Former Systems developer  . Quit date: 06/03/2007  Tobacco Comment   no alcohol since nov 12    Exercise Goals and Review: Exercise Goals    Row Name 01/08/20 1255             Exercise Goals   Increase Physical Activity  Yes       Intervention  Provide advice, education, support and counseling about physical activity/exercise needs.;Develop an individualized exercise prescription for aerobic and resistive training based on initial evaluation findings, risk stratification, comorbidities and participant's personal goals.       Expected Outcomes  Short Term: Attend rehab on a  regular basis to increase amount of physical activity.;Long Term: Add in home exercise to make exercise part of routine and to increase amount of physical activity.;Long Term: Exercising regularly at least 3-5 days a week.       Increase Strength and Stamina  Yes       Intervention  Provide advice, education, support and counseling about physical activity/exercise needs.;Develop an individualized exercise prescription for aerobic and resistive training based on initial evaluation findings, risk stratification, comorbidities and participant's personal goals.       Expected Outcomes  Short Term: Increase workloads from initial exercise prescription for resistance, speed, and METs.;Short Term: Perform resistance training exercises routinely during rehab and add in resistance training at home;Long Term: Improve cardiorespiratory fitness, muscular endurance and strength as measured by increased METs and functional capacity (6MWT)       Able to understand and use rate of perceived exertion (RPE) scale  Yes  Intervention  Provide education and explanation on how to use RPE scale       Expected Outcomes  Short Term: Able to use RPE daily in rehab to express subjective intensity level;Long Term:  Able to use RPE to guide intensity level when exercising independently       Able to understand and use Dyspnea scale  Yes       Intervention  Provide education and explanation on how to use Dyspnea scale       Expected Outcomes  Short Term: Able to use Dyspnea scale daily in rehab to express subjective sense of shortness of breath during exertion;Long Term: Able to use Dyspnea scale to guide intensity level when exercising independently       Knowledge and understanding of Target Heart Rate Range (THRR)  Yes       Intervention  Provide education and explanation of THRR including how the numbers were predicted and where they are located for reference       Expected Outcomes  Short Term: Able to use daily as  guideline for intensity in rehab;Short Term: Able to state/look up THRR;Long Term: Able to use THRR to govern intensity when exercising independently       Able to check pulse independently  Yes       Intervention  Provide education and demonstration on how to check pulse in carotid and radial arteries.;Review the importance of being able to check your own pulse for safety during independent exercise       Expected Outcomes  Short Term: Able to explain why pulse checking is important during independent exercise;Long Term: Able to check pulse independently and accurately       Understanding of Exercise Prescription  Yes       Intervention  Provide education, explanation, and written materials on patient's individual exercise prescription       Expected Outcomes  Short Term: Able to explain program exercise prescription;Long Term: Able to explain home exercise prescription to exercise independently          Copy of goals given to participant.

## 2020-01-10 ENCOUNTER — Other Ambulatory Visit: Payer: Self-pay

## 2020-01-10 ENCOUNTER — Encounter: Payer: Medicare Other | Admitting: *Deleted

## 2020-01-10 DIAGNOSIS — E119 Type 2 diabetes mellitus without complications: Secondary | ICD-10-CM | POA: Diagnosis not present

## 2020-01-10 DIAGNOSIS — I110 Hypertensive heart disease with heart failure: Secondary | ICD-10-CM | POA: Diagnosis not present

## 2020-01-10 DIAGNOSIS — I5022 Chronic systolic (congestive) heart failure: Secondary | ICD-10-CM | POA: Diagnosis not present

## 2020-01-10 DIAGNOSIS — Z87891 Personal history of nicotine dependence: Secondary | ICD-10-CM | POA: Diagnosis not present

## 2020-01-10 LAB — GLUCOSE, CAPILLARY
Glucose-Capillary: 187 mg/dL — ABNORMAL HIGH (ref 70–99)
Glucose-Capillary: 92 mg/dL (ref 70–99)

## 2020-01-10 NOTE — Progress Notes (Signed)
Daily Session Note  Patient Details  Name: Tim Mccullough MRN: [id number] Date of Birth: 17-Jul-1951 Referring Provider:     Cardiac Rehab from 01/08/2020 in [facility] Cardiac and Pulmonary Rehab  Referring Provider  [name] MD      Encounter Date: 01/10/2020  Check In: Session Check In - 01/10/20 1322      Check-In   Supervising physician immediately available to respond to emergencies  See telemetry face sheet for immediately available ER MD    Location  [facility]-Cardiac & Pulmonary Rehab    Staff Present  Heath Lark, RN, BSN, CCRP;Meredith Sherryll Burger, RN BSN;Joseph Foy [place], [place], ACSM CEP, Exercise Physiologist    Virtual Visit  No    Medication changes reported      No    Fall or balance concerns reported     No    Warm-up and Cool-down  Performed on first and last piece of equipment    Resistance Training Performed  Yes    VAD Patient?  No    PAD/SET Patient?  No      Pain Assessment   Currently in Pain?  No/denies          Social History   Tobacco Use  Smoking Status Former Smoker  . Years: 20.00  . Types: Cigarettes  . Quit date: 11/09/1998  . Years since quitting: 21.1  Smokeless Tobacco Former Systems developer  . Quit date: 06/03/2007  Tobacco Comment   no alcohol since nov 12    Goals Met:  Exercise tolerated well Personal goals reviewed Strength training completed today  Goals Unmet:  Not Applicable  Comments: First full day of exercise!  Patient was oriented to gym and equipment including functions, settings, policies, and procedures.  Patient's individual exercise prescription and treatment plan were reviewed.  All starting workloads were established based on the results of the 6 minute walk test done at initial orientation visit.  The plan for exercise progression was also introduced and progression will be customized based on patient's performance and goals.    Dr. Emily Filbert is Medical Director for [place] and  [facility].

## 2020-01-11 ENCOUNTER — Encounter: Payer: Medicare Other | Admitting: *Deleted

## 2020-01-11 ENCOUNTER — Other Ambulatory Visit: Payer: Self-pay

## 2020-01-11 ENCOUNTER — Encounter: Payer: Self-pay | Admitting: Internal Medicine

## 2020-01-11 ENCOUNTER — Telehealth: Payer: Self-pay | Admitting: Internal Medicine

## 2020-01-11 ENCOUNTER — Ambulatory Visit (INDEPENDENT_AMBULATORY_CARE_PROVIDER_SITE_OTHER): Payer: Medicare Other | Admitting: Internal Medicine

## 2020-01-11 VITALS — BP 120/80 | HR 84 | Ht 70.0 in | Wt 240.5 lb

## 2020-01-11 DIAGNOSIS — I493 Ventricular premature depolarization: Secondary | ICD-10-CM

## 2020-01-11 DIAGNOSIS — I10 Essential (primary) hypertension: Secondary | ICD-10-CM

## 2020-01-11 DIAGNOSIS — I5022 Chronic systolic (congestive) heart failure: Secondary | ICD-10-CM | POA: Diagnosis not present

## 2020-01-11 DIAGNOSIS — I491 Atrial premature depolarization: Secondary | ICD-10-CM

## 2020-01-11 DIAGNOSIS — Z87891 Personal history of nicotine dependence: Secondary | ICD-10-CM | POA: Diagnosis not present

## 2020-01-11 DIAGNOSIS — I110 Hypertensive heart disease with heart failure: Secondary | ICD-10-CM | POA: Diagnosis not present

## 2020-01-11 DIAGNOSIS — I5042 Chronic combined systolic (congestive) and diastolic (congestive) heart failure: Secondary | ICD-10-CM | POA: Diagnosis not present

## 2020-01-11 DIAGNOSIS — E119 Type 2 diabetes mellitus without complications: Secondary | ICD-10-CM | POA: Diagnosis not present

## 2020-01-11 LAB — GLUCOSE, CAPILLARY
Glucose-Capillary: 120 mg/dL — ABNORMAL HIGH (ref 70–99)
Glucose-Capillary: 114 mg/dL — ABNORMAL HIGH (ref 70–99)

## 2020-01-11 MED ORDER — FLECAINIDE ACETATE 100 MG PO TABS: ORAL_TABLET | ORAL | 1 refills | Status: DC

## 2020-01-11 MED ORDER — LOSARTAN POTASSIUM 50 MG PO TABS: 50.0000 mg | ORAL_TABLET | Freq: Every day | ORAL | 6 refills | Status: DC

## 2020-01-11 MED ORDER — METOPROLOL TARTRATE 25 MG PO TABS: 25.0000 mg | ORAL_TABLET | Freq: Two times a day (BID) | ORAL | 0 refills | Status: DC

## 2020-01-11 NOTE — Progress Notes (Signed)
Daily Session Note  Patient Details  Name: Tim Mccullough MRN: [id number] Date of Birth: 1951/05/09 Referring Provider:     Cardiac Rehab from 01/08/2020 in [facility] Cardiac and Pulmonary Rehab  Referring Provider  [name] MD      Encounter Date: 01/11/2020  Check In: Session Check In - 01/11/20 1313      Check-In   Supervising physician immediately available to respond to emergencies  See telemetry face sheet for immediately available ER MD    Location  [facility]-Cardiac & Pulmonary Rehab    Staff Present  Heath Lark, RN, BSN, CCRP;Amanda Sommer, BA, ACSM CEP, Exercise Physiologist;Jessica [place], MA, RCEP, CCRP, CCET;Joseph [employer] Visit  No    Medication changes reported      No    Fall or balance concerns reported     No    Warm-up and Cool-down  Performed on first and last piece of equipment    Resistance Training Performed  Yes    VAD Patient?  No    PAD/SET Patient?  No      Pain Assessment   Currently in Pain?  No/denies          Social History   Tobacco Use  Smoking Status Former Smoker  . Years: 20.00  . Types: Cigarettes  . Quit date: 11/09/1998  . Years since quitting: 21.1  Smokeless Tobacco Former Systems developer  . Quit date: 06/03/2007  Tobacco Comment   no alcohol since nov 12    Goals Met:  Independence with exercise equipment Exercise tolerated well No report of cardiac concerns or symptoms  Goals Unmet:  Not Applicable  Comments: Pt able to follow exercise prescription today without complaint.  Will continue to monitor for progression.    Dr. Emily Filbert is Medical Director for [place] and [facility].

## 2020-01-11 NOTE — Patient Instructions (Addendum)
Medication Instructions:  Your physician has recommended you make the following change in your medication:  1) DECREASE Metoprolol Tartrate to 25 mg take one tablet by mouth twice daily. 2. START Flecainide 100 mg take 1/2 tablet ( 50 mg ) by mouth twice daily until you come in for your EKG.  *If you need a refill on your cardiac medications before your next appointment, please call your pharmacy*   Lab Work: .None If you have labs (blood work) drawn today and your tests are completely normal, you will receive your results only by: Marland Kitchen MyChart Message (if you have MyChart) OR . A paper copy in the mail If you have any lab test that is abnormal or we need to change your treatment, we will call you to review the results.   Testing/Procedures: Nurse Visit for EKG in two weeks. On a day Dr Caryl Comes is in the office.  You are being referred to Dr Lovena Le in the [place] office. You will be called to schedule this appointment.   Follow-Up: At [facility], you and your health needs are our priority.  As part of our continuing mission to provide you with exceptional heart care, we have created designated Provider Care Teams.  These Care Teams include your primary Cardiologist (physician) and Advanced Practice Providers (APPs -  Physician Assistants and Nurse Practitioners) who all work together to provide you with the care you need, when you need it.  We recommend signing up for the patient portal called "MyChart".  Sign up information is provided on this After Visit Summary.  MyChart is used to connect with patients for Virtual Visits (Telemedicine).  Patients are able to view lab/test results, encounter notes, upcoming appointments, etc.  Non-urgent messages can be sent to your provider as well.   To learn more about what you can do with MyChart, go to [email or web address].    Your next appointment:   3 month(s)  The format for your next appointment:   In Person  Provider:   Virl Axe, MD

## 2020-01-11 NOTE — Progress Notes (Signed)
ELECTROPHYSIOLOGY CONSULT NOTE  Patient ID: Tim Mccullough, MRN: [id number], DOB/AGE: 14-Feb-1951 69 y.o. Admit date: (Not on file) Date of Consult: 01/11/2020  Primary Physician: Tonia Ghent, MD Primary Cardiologist: MA     Tim Mccullough is a 69 y.o. male who is being seen today for the evaluation of widecomplex beats  at the request of MA.    HPI Tim Mccullough is a 69 y.o. male seen in consultation for wide-complex beats in a pattern of bigeminy.  He has a history of nonischemic cardiomyopathy initially diagnosed in 2010.  These records are not available but the old chart reports ejection fraction of 35% and no obstructive coronary disease.  It also mentions a hemodynamically insignificant VSD.  He has been treated with beta-blockers and Entresto--last office notes were recorded.  Delene Mccullough is financially very burdensome.  Alternatives are being considered.\  Some dyspnea on exertion.  No edema.  No nocturnal dyspnea.  Has known sleep apnea.  Diagnosed years ago.  To bed tired awakens tired.  Diabetes-modestly poorly controlled with a hemoglobin A1c 6.8.  Weight is up about 20 pounds in the last 6 to 12 months.  Not aware of his ectopy.  No history of tachypalpitations.  No syncope.    DATE TEST EF   5/13 Echo   50-55 %   1/21 Echo   30-35 %   2/21 LHC  [place] - normal   Date Cr K Hgb  2/21 1.15 5.1 13.8             Past Medical History:  Diagnosis Date  . Abnormal echocardiogram 2/11   Repeated after med mgmt of cardiomyopathy showed EF 40-45%, mildly dilated LV, mild LV hypertrophy, anterolateral and apical hypokinesis, mild diastolic dysfunction.  . Anemia    hx  . Arthritis   . Blood transfusion without reported diagnosis 09/2012  . BPH (benign prostatic hypertrophy) 2004  . Cancer ([place])    melanoma back  . Cardiomyopathy    ? tachycardia-mediated  . Depression   . Diabetes mellitus 11/2006   Type II  . Diverticulosis   . Echocardiogram  abnormal 7/10   Mild LVH with a mildly dilated LV.  EF was difficult to assess given poor acoustic windows but appeared moderately decreased.  Mild MR.  Probable small perimembranous VSD, mild LAE  . Esophageal erosions   . GERD (gastroesophageal reflux disease)   . Hip pain   . History of MRI 7/10   Cardiac MRI was done to followup difficult echo.  This showed mild to moderately dilated LV, mild to moderate LAE, EF  33% with global hypokinesis, normal RV size with mild systolic dysfunction.  There was no large VSD evident.  There was no myocardial delayed enhancement so no evidence for infiltrative disease or infarction.  LHC (7/10) showed only luminal irreg. in the coronaries and EF 35%  . Holter monitor, abnormal 7/10   Frequent PAC's and PVC's  Average HR 96  (but he had started Toprol XL).  HR range 71-130  . Hyperlipidemia 04/1998  . Hypertension 1995  . Obesity   . OSA (obstructive sleep apnea)    Not using CPAP- improved with weight loss  . Pneumonia    HX OF pna  . Sinus tachycardia    Pt says he has been told his heart rate is high since he was in high school.   . Sleep apnea    has cpap; but does not use  .  VSD (ventricular septal defect)    Probable small perimembranous      Surgical History:  Past Surgical History:  Procedure Laterality Date  . CARDIAC CATHETERIZATION  05/30/2009   Dilated cardiomyopathy.  min VSD.  Hypokin Inf  wall.  Multiple min luminal Irr.  EF 35%  . COLONOSCOPY W/ BIOPSIES  08/24/2006   Divertics, polyps x3, bx negative, repeat 2012 with mild diverticulosis in teh sigmoid colon and 1 polyp in tranverse colon, repeat due 2017  . ESOPHAGOGASTRODUODENOSCOPY  2012   1)Barrett's esoph, 2) Erosive esophagitis, 3) Mild gastritis in the antrum, 4) Duodenitis in the bulb of duodenum, 5) Small hiatal hernia  . INGUINAL HERNIA REPAIR  1985   Left  . INGUINAL HERNIA REPAIR  1999   Right  . KNEE ARTHROSCOPY  10/15/2011   Procedure: ARTHROSCOPY KNEE;   Surgeon: Ninetta Lights, MD;  Location: [facility];  Service: Orthopedics;  Laterality: Right;  right knee scope with lateral meniscectomy, removal loose foreign body, and microfracture technique  . LAMINECTOMY  1987   with discectomy  . RIGHT/LEFT HEART CATH AND CORONARY ANGIOGRAPHY N/A 12/25/2019   Procedure: RIGHT/LEFT HEART CATH AND CORONARY ANGIOGRAPHY;  Surgeon: Wellington Hampshire, MD;  Location: [facility];  Service: Cardiovascular;  Laterality: N/A;  . ROTATOR CUFF REPAIR  2/11   rt  . TOTAL HIP ARTHROPLASTY  2011   Left  . TOTAL KNEE ARTHROPLASTY  06/01/2012   Procedure: TOTAL KNEE ARTHROPLASTY;  Surgeon: Ninetta Lights, MD;  Location: [facility];  Service: Orthopedics;  Laterality: Right;     Home Meds: Current Meds  Medication Sig  . aspirin 81 MG tablet Take 81 mg by mouth daily.   Marland Kitchen atorvastatin (LIPITOR) 40 MG tablet TAKE 1 TABLET BY MOUTH EVERY DAY (Patient taking differently: Take 40 mg by mouth daily. )  . doxazosin (CARDURA) 1 MG tablet 1 tablet daily  . fish oil-omega-3 fatty acids 1000 MG capsule Take 1 g by mouth 2 (two) times daily.   Marland Kitchen glimepiride (AMARYL) 1 MG tablet 1 tab in the AM, 1 in the PM (Patient taking differently: Take 1 mg by mouth in the morning and at bedtime. )  . Lancets (ONETOUCH ULTRASOFT) lancets USE DAILY AS NEEDED TO CHECK SUGAR, DX 250.00  . meloxicam (MOBIC) 15 MG tablet TAKE 1 TABLET BY MOUTH EVERY DAY (Patient taking differently: Take 15 mg by mouth daily. )  . metFORMIN (GLUCOPHAGE) 500 MG tablet TAKE 2 TABLETS (1,000 MG TOTAL) BY MOUTH 2 (TWO) TIMES DAILY WITH A MEAL.  . metoprolol tartrate (LOPRESSOR) 25 MG tablet Take 1.5 tablets (37.5 mg total) by mouth 2 (two) times daily.  Marland Kitchen omeprazole (PRILOSEC) 20 MG capsule Take 1 capsule (20 mg total) by mouth daily.  . ONE TOUCH ULTRA TEST test strip CHECK BLOOD SUGAR DAILY AND IF BLOOD SUGAR FLUCTUATES. 250.00  . oxymetazoline (AFRIN) 0.05 % nasal spray Place 1 spray into both  nostrils 2 (two) times daily as needed for congestion.  Marland Kitchen PARoxetine (PAXIL) 20 MG tablet Take 1 tablet (20 mg total) by mouth daily.  . sacubitril-valsartan (ENTRESTO) 24-26 MG Take 1 tablet by mouth 2 (two) times daily.    Allergies: No Known Allergies  Social History   Socioeconomic History  . Marital status: Single    Spouse name: Not on file  . Number of children: 3  . Years of education: Not on file  . Highest education level: Not on file  Occupational History  .  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 Occupation: [employer]  Tobacco Use  . Smoking status: Former Smoker    Years: 20.00    Types: Cigarettes    Quit date: 11/09/1998    Years since quitting: 21.1  . Smokeless tobacco: Former Systems developer    Quit date: 06/03/2007  . Tobacco comment: no alcohol since nov 12  Substance and Sexual Activity  . Alcohol use: No    Alcohol/week: 0.0 standard drinks    Comment: sober as 09/2011  . Drug use: No  . Sexual activity: Not Currently  Other Topics Concern  . Not on file  Social History Narrative   Lives in [place], daughter and her 2 kids live with patient, 3 kids (2 sons, 1 daughter)   Divorced   Social Determinants of Radio broadcast assistant Strain:   . Difficulty of Paying Living Expenses: Not on file  Food Insecurity:   . Worried About Charity fundraiser in the Last Year: Not on file  . Ran Out of Food in the Last Year: Not on file  Transportation Needs:   . Lack of Transportation (Medical): Not on file  . Lack of Transportation (Non-Medical): Not on file  Physical Activity:   . Days of Exercise per Week: Not on file  . Minutes of Exercise per Session: Not on file  Stress:   . Feeling of Stress : Not on file  Social Connections:   . Frequency of Communication with Friends and Family: Not on file  . Frequency of Social Gatherings with Friends and Family: Not on file  . Attends Religious Services: Not on file  . Active Member  of Clubs or Organizations: Not on file  . Attends Archivist Meetings: Not on file  . Marital Status: Not on file  Intimate Partner Violence:   . Fear of Current or Ex-Partner: Not on file  . Emotionally Abused: Not on file  . Physically Abused: Not on file  . Sexually Abused: Not on file     Family History  Problem Relation Age of Onset  . Heart disease Father        CABG  . Stroke Father   . Diabetes Father   . Hypertension Mother   . Alzheimer's disease Mother   . Diabetes Sister   . Hypertension Sister   . Cancer Sister        uterine cancer  . Diabetes Brother   . Throat cancer Brother        Throat  . Esophageal cancer Brother   . Diabetes Sister   . Hypertension Sister   . Throat cancer Other        Throat  . Diabetes Other   . Alcohol abuse Other   . Diabetes Maternal Grandmother        Insulin  . Colon cancer Cousin   . Esophageal cancer Maternal Grandfather   . Drug abuse Neg Hx   . Prostate cancer Neg Hx   . Rectal cancer Neg Hx      ROS:  Please see the history of present illness.     All other systems reviewed and negative.    Physical Exam: Blood pressure 120/80, pulse 84, height 5\' 10"  (1.778 m), weight 240 lb 8 oz (109.1 kg), SpO2 96 %. General: Well developed, well nourished male in no acute distress. Head: Normocephalic, atraumatic, sclera non-icteric, no xanthomas, nares are without discharge. EENT: normal  Lymph Nodes:  none Neck: Negative for carotid bruits. JVD not elevated. Back:without scoliosis kyphosis Lungs: Clear bilaterally to auscultation without wheezes, rales, or rhonchi. Breathing is unlabored. Heart: irregular RR with S1 S2. No  murmur . No rubs, or gallops appreciated. Abdomen: Soft, non-tender, non-distended with normoactive bowel sounds. No hepatomegaly. No rebound/guarding. No obvious abdominal masses. Msk:  Strength and tone appear normal for age. Extremities: No clubbing or cyanosis. No  edema.  Distal pedal  pulses are 2+ and equal bilaterally. Skin: Warm and Dry Neuro: Alert and oriented X 3. CN III-XII intact Grossly normal sensory and motor function . Psych:  Responds to questions appropriately with a normal affect.      Labs: Cardiac Enzymes No results for input(s): CKTOTAL, CKMB, TROPONINI in the last 72 hours. CBC Lab Results  Component Value Date   WBC 6.7 12/22/2019   HGB 13.8 12/22/2019   HCT 40.9 12/22/2019   MCV 92 12/22/2019   PLT 228 12/22/2019   PROTIME: No results for input(s): LABPROT, INR in the last 72 hours. Chemistry No results for input(s): NA, K, CL, CO2, BUN, CREATININE, CALCIUM, PROT, BILITOT, ALKPHOS, ALT, AST, GLUCOSE in the last 168 hours.  Invalid input(s): LABALBU Lipids Lab Results  Component Value Date   CHOL 142 08/21/2019   HDL 38.70 (L) 08/21/2019   LDLCALC 76 08/21/2019   TRIG 137.0 08/21/2019   BNP No results found for: PROBNP Thyroid Function Tests: No results for input(s): TSH, T4TOTAL, T3FREE, THYROIDAB in the last 72 hours.  Invalid input(s): FREET3 Miscellaneous No results found for: DDIMER  Radiology/Studies:  CARDIAC CATHETERIZATION  Result Date: 12/25/2019 1.  Normal coronary arteries. 2.  Right heart catheterization showed mildly elevated filling pressures (PCW of 14 mmHg), mild pulmonary hypertension (37/21 mmHg) and mildly reduced cardiac output at 4.69 with a cardiac index of 2.09. Recommendations: Continue medical therapy for nonischemic cardiomyopathy. Consider EP evaluation for treatment of PVCs which might be contributing to worsening cardiomyopathy.   EKG: Multiple ECGs were personally reviewed dating back to 2016.  Most include bigeminal wide-complex beats.  There is some degree of fusion on some beats.  Some are associated with an antecedent P wave that  suggest preexcitation; these were reviewed with Dr. Elliot Cousin and Greggory Brandy and suggest a origin near the mitral aortic continuity, either of accessory pathway or of the PVC  focus  Assessment and Plan:  Nonischemic cardiomyopathy  Bigeminal wide-complex beat, PVCs versus preexcitation-relatively narrow \ Sleep disordered breathing/sleep apnea  Obesity  Diabetes  Hypertension   The patient has frequent wide-complex beats that have been persistent for years mostly occurring in a pattern of bigeminy.  On one tracing specifically 2019 the appearance suggested preexcitation with antecedent P waves.  Other tracings are not so suggestive and indeed on the tracing from today there is in the ST segment of the first beat of the complex a suggestion of a P wave.  This would support the longstanding hypothesis that these are in fact ventricular beats and not preexcited beats.  Notably however, the QRS duration of these bigeminal beats is relatively narrow and in fact one more recent tracing today, appear to be narrower than the intrinsically conducted beats.  This was suggested location near the conduction pathway and distal to the area of conduction slowing giving rise to the IVCD.  We discussed 2 treatment strategies, the first is antiarrhythmic suppression and the second would be catheter ablation.  He prefers the former.  There are data suggesting that it is safe to  at least initiate 1C therapy in this cohort with the hopes that the cardiomyopathy is related to the ectopy and with ectopic suppression the cardiomyopathy improved.  We will begin him on flecainide.  We will anticipated 50 mg a day and then will check his ECG in a few weeks and increase it if indicated.  Referred to Dr. Elliot Cousin for further discussions regarding mapping and possible ablation.  We discussed the possibility that the narrowness of the PVC/ectopic beats, their QRS morphology of preexcited suggests proximity to the intrinsic conduction system and the possibility of heart block with ablation.  He is unable to afford the [facility].  We will switch him to losartan 50 mg daily.   Virl Axe

## 2020-01-11 NOTE — Telephone Encounter (Signed)
Spoke with patient regarding medication changes that were made in clinic this morning. He verbalizes understanding of these changes but is concerned with not being able to afford his prescribed entresto. He requests that he be placed back on Losartan in replacement of Entresto. I let the patient know that we would address this request with Dr Caryl Comes and get back to him.   Encouraged patient to call back with any other questions or concerns.

## 2020-01-11 NOTE — Telephone Encounter (Signed)
Pt c/o medication issue:  1. Name of Medication: changes   2. How are you currently taking this medication (dosage and times per day)?   3. Are you having a reaction (difficulty breathing--STAT)?    4. What is your medication issue? Patient says avs doesn't tell him what medication changes were done today   Please call

## 2020-01-11 NOTE — Telephone Encounter (Signed)
Reviewed with MD, will stop entresto and start Losartan 50 mg once daily.   Spoke with patient regarding this medication change. Patient verbalizes understanding.   Encouraged patient to call back with any questions or concerns.

## 2020-01-12 ENCOUNTER — Telehealth: Payer: Self-pay | Admitting: Internal Medicine

## 2020-01-12 NOTE — Telephone Encounter (Signed)
Pt c/o medication issue:  1. Name of Medication: flecainide   2. How are you currently taking this medication (dosage and times per day)? 100 MG 1 twice daily  3. Are you having a reaction (difficulty breathing--STAT)? No, possible interaction  4. What is your medication issue? Patient calling, states when picking up medication the pharmacist mentioned that flecainide may cause an interaction with Paxil - could cause an irregular heartbeat.  Please call to discuss.

## 2020-01-12 NOTE — Telephone Encounter (Signed)
To Dr. Klein to review. 

## 2020-01-15 ENCOUNTER — Encounter: Payer: Medicare Other | Admitting: *Deleted

## 2020-01-15 ENCOUNTER — Other Ambulatory Visit: Payer: Self-pay

## 2020-01-15 DIAGNOSIS — I5022 Chronic systolic (congestive) heart failure: Secondary | ICD-10-CM

## 2020-01-15 DIAGNOSIS — I110 Hypertensive heart disease with heart failure: Secondary | ICD-10-CM | POA: Diagnosis not present

## 2020-01-15 DIAGNOSIS — E119 Type 2 diabetes mellitus without complications: Secondary | ICD-10-CM | POA: Diagnosis not present

## 2020-01-15 DIAGNOSIS — Z87891 Personal history of nicotine dependence: Secondary | ICD-10-CM | POA: Diagnosis not present

## 2020-01-15 LAB — GLUCOSE, CAPILLARY
Glucose-Capillary: 152 mg/dL — ABNORMAL HIGH (ref 70–99)
Glucose-Capillary: 105 mg/dL — ABNORMAL HIGH (ref 70–99)

## 2020-01-15 NOTE — Telephone Encounter (Signed)
Patient waiting to hear back about reaction. Patient is waiting to start medication until he hears back  Please advise

## 2020-01-15 NOTE — Progress Notes (Signed)
Daily Session Note  Patient Details  Name: Tim Mccullough MRN: [id number] Date of Birth: 01/29/1951 Referring Provider:     Cardiac Rehab from 01/08/2020 in [facility] Cardiac and Pulmonary Rehab  Referring Provider  [name] MD      Encounter Date: 01/15/2020  Check In: Session Check In - 01/15/20 1331      Check-In   Supervising physician immediately available to respond to emergencies  See telemetry face sheet for immediately available ER MD    Location  [facility]-Cardiac & Pulmonary Rehab    Staff Present  Heath Lark, RN, BSN, Laveda Norman, BS, ACSM CEP, Exercise Physiologist;Jessica [place], MA, RCEP, CCRP, CCET;Melissa Caiola RDN, LDN    Virtual Visit  No    Medication changes reported      No    Fall or balance concerns reported     No    Warm-up and Cool-down  Performed on first and last piece of equipment    Resistance Training Performed  Yes    VAD Patient?  No    PAD/SET Patient?  No      Pain Assessment   Currently in Pain?  No/denies          Social History   Tobacco Use  Smoking Status Former Smoker  . Years: 20.00  . Types: Cigarettes  . Quit date: 11/09/1998  . Years since quitting: 21.1  Smokeless Tobacco Former Systems developer  . Quit date: 06/03/2007  Tobacco Comment   no alcohol since nov 12    Goals Met:  Independence with exercise equipment Exercise tolerated well No report of cardiac concerns or symptoms  Goals Unmet:  Not Applicable  Comments: Pt able to follow exercise prescription today without complaint.  Will continue to monitor for progression.    Dr. Emily Filbert is Medical Director for [place] and [facility].

## 2020-01-15 NOTE — Telephone Encounter (Signed)
I spoke with Dr. Caryl Comes by phone today regarding the possible medication interaction between flecainide/ Paxil. Per Dr. Caryl Comes, there is a small percentage of interaction. Orders received to have the patient start flecainide 100 mg- 0.5 tablet (50 mg) by mouth twice daily. Dr. Caryl Comes is aware the patient has a follow up appointment on 3/18 with Dr. Fletcher Anon and will have a repeat EKG at that time.  Per Dr. Caryl Comes- the patient should keep this appointment.  I have called and spoken with the patient.  He is aware of Dr. Olin Pia recommendations as above and voices understanding.

## 2020-01-17 ENCOUNTER — Other Ambulatory Visit: Payer: Self-pay

## 2020-01-17 ENCOUNTER — Encounter: Payer: Medicare Other | Admitting: *Deleted

## 2020-01-17 DIAGNOSIS — I110 Hypertensive heart disease with heart failure: Secondary | ICD-10-CM | POA: Diagnosis not present

## 2020-01-17 DIAGNOSIS — I5022 Chronic systolic (congestive) heart failure: Secondary | ICD-10-CM

## 2020-01-17 DIAGNOSIS — Z87891 Personal history of nicotine dependence: Secondary | ICD-10-CM | POA: Diagnosis not present

## 2020-01-17 DIAGNOSIS — E119 Type 2 diabetes mellitus without complications: Secondary | ICD-10-CM | POA: Diagnosis not present

## 2020-01-17 NOTE — Progress Notes (Signed)
Daily Session Note  Patient Details  Name: Coree M Comes MRN: [id number] Date of Birth: 01/18/1951 Referring Provider:     Cardiac Rehab from 01/08/2020 in [facility] Cardiac and Pulmonary Rehab  Referring Provider  Arida, Muhammad MD      Encounter Date: 01/17/2020  Check In: Session Check In - 01/17/20 1305      Check-In   Supervising physician immediately available to respond to emergencies  See telemetry face sheet for immediately available ER MD    Location  [facility]-Cardiac & Pulmonary Rehab    Staff Present   , RN BSN;Joseph Hood RCP,RRT,BSRT;Jeanna Durrell BS, Exercise Physiologist    Virtual Visit  No    Medication changes reported      No    Fall or balance concerns reported     No    Warm-up and Cool-down  Performed on first and last piece of equipment    Resistance Training Performed  Yes    VAD Patient?  No    PAD/SET Patient?  No      Pain Assessment   Currently in Pain?  No/denies          Social History   Tobacco Use  Smoking Status Former Smoker  . Years: 20.00  . Types: Cigarettes  . Quit date: 11/09/1998  . Years since quitting: 21.2  Smokeless Tobacco Former User  . Quit date: 06/03/2007  Tobacco Comment   no alcohol since nov 12    Goals Met:  Independence with exercise equipment Exercise tolerated well No report of cardiac concerns or symptoms Strength training completed today  Goals Unmet:  Not Applicable  Comments: Pt able to follow exercise prescription today without complaint.  Will continue to monitor for progression.    Dr. Mark Miller is Medical Director for [facility] and [facility]. 

## 2020-01-18 ENCOUNTER — Encounter: Payer: Medicare Other | Admitting: *Deleted

## 2020-01-18 ENCOUNTER — Other Ambulatory Visit: Payer: Self-pay

## 2020-01-18 DIAGNOSIS — E119 Type 2 diabetes mellitus without complications: Secondary | ICD-10-CM | POA: Diagnosis not present

## 2020-01-18 DIAGNOSIS — I110 Hypertensive heart disease with heart failure: Secondary | ICD-10-CM | POA: Diagnosis not present

## 2020-01-18 DIAGNOSIS — I5022 Chronic systolic (congestive) heart failure: Secondary | ICD-10-CM | POA: Diagnosis not present

## 2020-01-18 DIAGNOSIS — Z87891 Personal history of nicotine dependence: Secondary | ICD-10-CM | POA: Diagnosis not present

## 2020-01-18 NOTE — Progress Notes (Signed)
Daily Session Note  Patient Details  Name: Tim Mccullough MRN: [id number] Date of Birth: 1951-10-26 Referring Provider:     Cardiac Rehab from 01/08/2020 in [facility] Cardiac and Pulmonary Rehab  Referring Provider  Tim Sacramento MD      Encounter Date: 01/18/2020  Check In: Session Check In - 01/18/20 1307      Check-In   Supervising physician immediately available to respond to emergencies  See telemetry face sheet for immediately available ER MD    Location  [facility]iac & Pulmonary Rehab    Staff Present  Heath Lark, RN, BSN, CCRP;Meredith Sherryll Burger, RN BSN;Joseph [address] [place], [place], [place], CCRP, CCET    Virtual Visit  No    Medication changes reported      No    Fall or balance concerns reported     No    Warm-up and Cool-down  Performed on first and last piece of equipment    Resistance Training Performed  Yes    VAD Patient?  No    PAD/SET Patient?  No      Pain Assessment   Currently in Pain?  No/denies          Social History   Tobacco Use  Smoking Status Former Smoker  . Years: 20.00  . Types: Cigarettes  . Quit date: 11/09/1998  . Years since quitting: 21.2  Smokeless Tobacco Former Systems developer  . Quit date: 06/03/2007  Tobacco Comment   no alcohol since nov 12    Goals Met:  Independence with exercise equipment Exercise tolerated well No report of cardiac concerns or symptoms  Goals Unmet:  Not Applicable  Comments: Pt able to follow exercise prescription today without complaint.  Will continue to monitor for progression.    Dr. Emily Filbert is Medical Director for [place] and [facility].

## 2020-01-20 DIAGNOSIS — Z23 Encounter for immunization: Secondary | ICD-10-CM | POA: Diagnosis not present

## 2020-01-22 ENCOUNTER — Encounter: Payer: Medicare Other | Admitting: *Deleted

## 2020-01-22 ENCOUNTER — Other Ambulatory Visit: Payer: Self-pay

## 2020-01-22 DIAGNOSIS — I110 Hypertensive heart disease with heart failure: Secondary | ICD-10-CM | POA: Diagnosis not present

## 2020-01-22 DIAGNOSIS — I5022 Chronic systolic (congestive) heart failure: Secondary | ICD-10-CM

## 2020-01-22 DIAGNOSIS — Z87891 Personal history of nicotine dependence: Secondary | ICD-10-CM | POA: Diagnosis not present

## 2020-01-22 DIAGNOSIS — E119 Type 2 diabetes mellitus without complications: Secondary | ICD-10-CM | POA: Diagnosis not present

## 2020-01-22 NOTE — Progress Notes (Signed)
Daily Session Note  Patient Details  Name: Tim Mccullough MRN: [id number] Date of Birth: [birth year] Referring Provider:     Cardiac Rehab from 01/08/2020 in [facility] Cardiac and Pulmonary Rehab  Referring Provider  [name] MD      Encounter Date: 01/22/2020  Check In: Session Check In - 01/22/20 1307      Check-In   Supervising physician immediately available to respond to emergencies  See telemetry face sheet for immediately available ER MD    Location  [facility] Pul[facility] Re[facility]    Staff Present  Heath Lark, RN, BSN, CCRP;Melissa Caiola RDN, LDN;Meredith Sherryll Burger, RN BSN;Jessica [place], MA, RCEP, CCRP, CCET;Joseph [place], [place], ACSM CEP, Exercise Physiologist    Virtual Visit  No    Medication changes reported      No    Fall or balance concerns reported     No    Warm-up and Cool-down  Performed on first and last piece of equipment    Resistance Training Performed  Yes    VAD Patient?  No    PAD/SET Patient?  No      Pain Assessment   Currently in Pain?  No/denies          Social History   Tobacco Use  Smoking Status Former Smoker  . Years: 20.00  . Types: Cigarettes  . Quit date: 11/09/1998  . Years since quitting: 21.2  Smokeless Tobacco Former Systems developer  . Quit date: 06/03/2007  Tobacco Comment   no alcohol since nov 12    Goals Met:  Independence with exercise equipment Exercise tolerated well No report of cardiac concerns or symptoms  Goals Unmet:  Not Applicable  Comments: Pt able to follow exercise prescription today without complaint.  Will continue to monitor for progression.    Dr. Emily Filbert is Medical Director for [place] and [facility].

## 2020-01-24 ENCOUNTER — Other Ambulatory Visit: Payer: Self-pay

## 2020-01-24 ENCOUNTER — Encounter: Payer: Medicare Other | Admitting: *Deleted

## 2020-01-24 DIAGNOSIS — I110 Hypertensive heart disease with heart failure: Secondary | ICD-10-CM | POA: Diagnosis not present

## 2020-01-24 DIAGNOSIS — Z87891 Personal history of nicotine dependence: Secondary | ICD-10-CM | POA: Diagnosis not present

## 2020-01-24 DIAGNOSIS — I5022 Chronic systolic (congestive) heart failure: Secondary | ICD-10-CM | POA: Diagnosis not present

## 2020-01-24 DIAGNOSIS — E119 Type 2 diabetes mellitus without complications: Secondary | ICD-10-CM | POA: Diagnosis not present

## 2020-01-24 NOTE — Progress Notes (Signed)
Daily Session Note  Patient Details  Name: Tim Mccullough MRN: [id number] Date of Birth: 03-Oct-1951 Referring Provider:     Cardiac Rehab from 01/08/2020 in [facility] Cardiac and Pulmonary Rehab  Referring Provider  [name] MD      Encounter Date: 01/24/2020  Check In: Session Check In - 01/24/20 1320      Check-In   Supervising physician immediately available to respond to emergencies  See telemetry face sheet for immediately available ER MD    Location  [facility]-Cardiac & Pulmonary Rehab    Staff Present  Heath Lark, RN, BSN, CCRP;Melissa Caiola RDN, LDN;Meredith Sherryll Burger, RN BSN;Joseph Hood RCP,RRT,BSRT    Virtual Visit  No    Medication changes reported      No    Fall or balance concerns reported     No    Warm-up and Cool-down  Performed on first and last piece of equipment    Resistance Training Performed  Yes    VAD Patient?  No    PAD/SET Patient?  No      Pain Assessment   Currently in Pain?  No/denies          Social History   Tobacco Use  Smoking Status Former Smoker  . Years: 20.00  . Types: Cigarettes  . Quit date: 11/09/1998  . Years since quitting: 21.2  Smokeless Tobacco Former Systems developer  . Quit date: 06/03/2007  Tobacco Comment   no alcohol since nov 12    Goals Met:  Independence with exercise equipment Exercise tolerated well No report of cardiac concerns or symptoms  Goals Unmet:  Not Applicable  Comments: Pt able to follow exercise prescription today without complaint.  Will continue to monitor for progression.    Dr. Emily Filbert is Medical Director for [place] [facility].

## 2020-01-25 ENCOUNTER — Encounter: Payer: Self-pay | Admitting: Cardiovascular Disease

## 2020-01-25 ENCOUNTER — Other Ambulatory Visit: Payer: Self-pay

## 2020-01-25 ENCOUNTER — Encounter: Payer: Medicare Other | Admitting: *Deleted

## 2020-01-25 ENCOUNTER — Ambulatory Visit (INDEPENDENT_AMBULATORY_CARE_PROVIDER_SITE_OTHER): Payer: Medicare Other | Admitting: Cardiovascular Disease

## 2020-01-25 ENCOUNTER — Ambulatory Visit: Payer: Medicare Other

## 2020-01-25 ENCOUNTER — Encounter: Payer: Self-pay | Admitting: Internal Medicine

## 2020-01-25 VITALS — BP 110/72 | HR 81 | Ht 70.0 in | Wt 238.0 lb

## 2020-01-25 DIAGNOSIS — E119 Type 2 diabetes mellitus without complications: Secondary | ICD-10-CM | POA: Diagnosis not present

## 2020-01-25 DIAGNOSIS — I493 Ventricular premature depolarization: Secondary | ICD-10-CM

## 2020-01-25 DIAGNOSIS — E785 Hyperlipidemia, unspecified: Secondary | ICD-10-CM

## 2020-01-25 DIAGNOSIS — I5022 Chronic systolic (congestive) heart failure: Secondary | ICD-10-CM | POA: Diagnosis not present

## 2020-01-25 DIAGNOSIS — I110 Hypertensive heart disease with heart failure: Secondary | ICD-10-CM | POA: Diagnosis not present

## 2020-01-25 DIAGNOSIS — I10 Essential (primary) hypertension: Secondary | ICD-10-CM | POA: Diagnosis not present

## 2020-01-25 DIAGNOSIS — I5042 Chronic combined systolic (congestive) and diastolic (congestive) heart failure: Secondary | ICD-10-CM | POA: Diagnosis not present

## 2020-01-25 DIAGNOSIS — Z87891 Personal history of nicotine dependence: Secondary | ICD-10-CM | POA: Diagnosis not present

## 2020-01-25 MED ORDER — METOPROLOL SUCCINATE ER 50 MG PO TB24: 50.0000 mg | ORAL_TABLET | Freq: Every day | ORAL | 3 refills | Status: DC

## 2020-01-25 MED ORDER — SPIRONOLACTONE 25 MG PO TABS: 25.0000 mg | ORAL_TABLET | Freq: Every day | ORAL | 3 refills | Status: DC

## 2020-01-25 NOTE — Progress Notes (Signed)
Daily Session Note  Patient Details  Name: Tim Mccullough MRN: [id number] Date of Birth: 12-Jun-1951 Referring Provider:     Cardiac Rehab from 01/08/2020 in [facility] Cardiac and Pulmonary Rehab  Referring Provider  [name] MD      Encounter Date: 01/25/2020  Check In: Session Check In - 01/25/20 1319      Check-In   Supervising physician immediately available to respond to emergencies  See telemetry face sheet for immediately available ER MD    Location  [facility]-Cardiac & Pulmonary Rehab    Staff Present  Heath Lark, RN, BSN, CCRP;Joseph Hood RCP,RRT,BSRT;Jessica [place] [facility] [place], [place], [place], [place], CCET    Virtual Visit  No    Medication changes reported      No    Fall or balance concerns reported     No    Warm-up and Cool-down  Performed on first and last piece of equipment    Resistance Training Performed  Yes    VAD Patient?  No    PAD/SET Patient?  No      Pain Assessment   Currently in Pain?  No/denies          Social History   Tobacco Use  Smoking Status Former Smoker  . Years: 20.00  . Types: Cigarettes  . Quit date: 11/09/1998  . Years since quitting: 21.2  Smokeless Tobacco Former Systems developer  . Quit date: 06/03/2007  Tobacco Comment   no alcohol since nov 12    Goals Met:  Independence with exercise equipment Exercise tolerated well No report of cardiac concerns or symptoms  Goals Unmet:  Not Applicable  Comments: Pt able to follow exercise prescription today without complaint.  Will continue to monitor for progression.    Dr. Emily Filbert is Medical Director for [place] [facility].

## 2020-01-25 NOTE — Progress Notes (Signed)
Cardiology Office Note   Date:  01/25/2020   ID:  Tim Mccullough, DOB 04-21-1951, MRN [id number]  PCP:  Tonia Ghent, MD  Cardiologist:   [name], MD   Chief Complaint  Patient presents with  . Other    1 month follow up. Patient c/o some SOB. Meds reviewed verbally with patient.       History of Present Illness: Tim Mccullough is a 69 y.o. male who presents for a follow-up visit regarding chronic systolic heart failure due to nonischemic cardiomyopathy. This was diagnosed in 2010. Cardiac catheterization showed minor luminal irregularities without obstructive CAD. Ejection fraction was 35%.  Subsequently, his ejection fraction improved to  50-55% in May of  2013 .  He has other chronic medical conditions that include diabetes, Hyperlipidemia, frequent PACs and essential hypertension.  He had carotid Doppler done in 2017 for carotid bruits which showed mild bilateral internal carotid artery disease.  There was moderate common carotid disease.  However, repeat testing in September 2018 showed improvement.  He had an echocardiogram done in January 2021 which showed worsening LV systolic function with an EF of 30 to 35%.  I proceeded with a right and left cardiac catheterization in February which showed normal coronary arteries.  Right heart cath showed mildly elevated filling pressures and pulmonary hypertension with mildly reduced cardiac output.  The patient was noted to have frequent PVCs thought to be possibly the culprit for worsening LV systolic function.  Patient was seen by Dr. Caryl Comes and was started on flecainide .  He was referred to Dr. Lovena Le to consider possible ablation.  The patient was started on Entresto but he could not afford it and thus was switched back to losartan 50 mg daily.  The patient reports some exertional dyspnea lately but no chest pain or lower extremity edema.  No orthopnea or PND.  He is tolerating all the medications.   Past Medical History:    Diagnosis Date  . Abnormal echocardiogram 2/11   Repeated after med mgmt of cardiomyopathy showed EF 40-45%, mildly dilated LV, mild LV hypertrophy, anterolateral and apical hypokinesis, mild diastolic dysfunction.  . Anemia    hx  . Arthritis   . Blood transfusion without reported diagnosis 09/2012  . BPH (benign prostatic hypertrophy) 2004  . Cancer ([place])    melanoma back  . Cardiomyopathy    ? tachycardia-mediated  . Depression   . Diabetes mellitus 11/2006   Type II  . Diverticulosis   . Echocardiogram abnormal 7/10   Mild LVH with a mildly dilated LV.  EF was difficult to assess given poor acoustic windows but appeared moderately decreased.  Mild MR.  Probable small perimembranous VSD, mild LAE  . Esophageal erosions   . GERD (gastroesophageal reflux disease)   . Hip pain   . History of MRI 7/10   Cardiac MRI was done to followup difficult echo.  This showed mild to moderately dilated LV, mild to moderate LAE, EF  33% with global hypokinesis, normal RV size with mild systolic dysfunction.  There was no large VSD evident.  There was no myocardial delayed enhancement so no evidence for infiltrative disease or infarction.  LHC (7/10) showed only luminal irreg. in the coronaries and EF 35%  . Holter monitor, abnormal 7/10   Frequent PAC's and PVC's  Average HR 96  (but he had started Toprol XL).  HR range 71-130  . Hyperlipidemia 04/1998  . Hypertension 1995  . Obesity   .  OSA (obstructive sleep apnea)    Not using CPAP- improved with weight loss  . Pneumonia    HX OF pna  . Sinus tachycardia    Pt says he has been told his heart rate is high since he was in high school.   . Sleep apnea    has cpap; but does not use  . VSD (ventricular septal defect)    Probable small perimembranous    Past Surgical History:  Procedure Laterality Date  . CARDIAC CATHETERIZATION  05/30/2009   Dilated cardiomyopathy.  min VSD.  Hypokin Inf  wall.  Multiple min luminal Irr.  EF 35%  .  COLONOSCOPY W/ BIOPSIES  08/24/2006   Divertics, polyps x3, bx negative, repeat 2012 with mild diverticulosis in teh sigmoid colon and 1 polyp in tranverse colon, repeat due 2017  . ESOPHAGOGASTRODUODENOSCOPY  2012   1)Barrett's esoph, 2) Erosive esophagitis, 3) Mild gastritis in the antrum, 4) Duodenitis in the bulb of duodenum, 5) Small hiatal hernia  . INGUINAL HERNIA REPAIR  1985   Left  . INGUINAL HERNIA REPAIR  1999   Right  . KNEE ARTHROSCOPY  10/15/2011   Procedure: ARTHROSCOPY KNEE;  Surgeon: Ninetta Lights, MD;  Location: [facility];  Service: Orthopedics;  Laterality: Right;  right knee scope with lateral meniscectomy, removal loose foreign body, and microfracture technique  . LAMINECTOMY  1987   with discectomy  . RIGHT/LEFT HEART CATH AND CORONARY ANGIOGRAPHY N/A 12/25/2019   Procedure: RIGHT/LEFT HEART CATH AND CORONARY ANGIOGRAPHY;  Surgeon: Wellington Hampshire, MD;  Location: [facility];  Service: Cardiovascular;  Laterality: N/A;  . ROTATOR CUFF REPAIR  2/11   rt  . TOTAL HIP ARTHROPLASTY  2011   Left  . TOTAL KNEE ARTHROPLASTY  06/01/2012   Procedure: TOTAL KNEE ARTHROPLASTY;  Surgeon: Ninetta Lights, MD;  Location: [facility];  Service: Orthopedics;  Laterality: Right;     Current Outpatient Medications  Medication Sig Dispense Refill  . aspirin 81 MG tablet Take 81 mg by mouth daily.     Marland Kitchen atorvastatin (LIPITOR) 40 MG tablet TAKE 1 TABLET BY MOUTH EVERY DAY (Patient taking differently: Take 40 mg by mouth daily. ) 90 tablet 3  . doxazosin (CARDURA) 1 MG tablet 1 tablet daily 90 tablet 3  . fish oil-omega-3 fatty acids 1000 MG capsule Take 1 g by mouth 2 (two) times daily.     . flecainide (TAMBOCOR) 100 MG tablet Take one tablet (100 mg) by mouth twice daily as directed. 60 tablet 1  . glimepiride (AMARYL) 1 MG tablet 1 tab in the AM, 1 in the PM (Patient taking differently: Take 1 mg by mouth in the morning and at bedtime. )    . Lancets (ONETOUCH  ULTRASOFT) lancets USE DAILY AS NEEDED TO CHECK SUGAR, DX 250.00 100 each 2  . losartan (COZAAR) 50 MG tablet Take 1 tablet (50 mg total) by mouth daily. 30 tablet 6  . meloxicam (MOBIC) 15 MG tablet TAKE 1 TABLET BY MOUTH EVERY DAY (Patient taking differently: Take 15 mg by mouth daily. ) 90 tablet 3  . metFORMIN (GLUCOPHAGE) 500 MG tablet TAKE 2 TABLETS (1,000 MG TOTAL) BY MOUTH 2 (TWO) TIMES DAILY WITH A MEAL. 360 tablet 3  . metoprolol tartrate (LOPRESSOR) 25 MG tablet Take 1 tablet (25 mg total) by mouth 2 (two) times daily.  0  . omeprazole (PRILOSEC) 20 MG capsule Take 1 capsule (20 mg total) by mouth daily. Mango  capsule 11  . ONE TOUCH ULTRA TEST test strip CHECK BLOOD SUGAR DAILY AND IF BLOOD SUGAR FLUCTUATES. 250.00 100 each 11  . oxymetazoline (AFRIN) 0.05 % nasal spray Place 1 spray into both nostrils 2 (two) times daily as needed for congestion.    Marland Kitchen PARoxetine (PAXIL) 20 MG tablet Take 1 tablet (20 mg total) by mouth daily. 90 tablet 3   No current facility-administered medications for this visit.    Allergies:   Patient has no known allergies.    Social History:  The patient  reports that he quit smoking about 21 years ago. His smoking use included cigarettes. He quit after 20.00 years of use. He quit smokeless tobacco use about 12 years ago. He reports that he does not drink alcohol or use drugs.   Family History:  The patient's family history includes Alcohol abuse in an other family member; Alzheimer's disease in his mother; Cancer in his sister; Colon cancer in his cousin; Diabetes in his brother, father, maternal grandmother, sister, sister, and another family member; Esophageal cancer in his brother and maternal grandfather; Heart disease in his father; Hypertension in his mother, sister, and sister; Stroke in his father; Throat cancer in his brother and another family member.    ROS:  Please see the history of present illness.   Otherwise, review of systems are positive for  none.   All other systems are reviewed and negative.    PHYSICAL EXAM: VS:  BP 110/72 (BP Location: Left Arm, Patient Position: Sitting, Cuff Size: Normal)   Pulse 81   Ht 5\' 10"  (1.778 m)   Wt 238 lb (108 kg)   BMI 34.15 kg/m  , BMI Body mass index is 34.15 kg/m. GEN: Well nourished, well developed, in no acute distress  HEENT: normal  Neck: no JVD, or masses. Faint left carotid bruit Cardiac: RRR; no murmurs, rubs, or gallops,no edema  Respiratory:  clear to auscultation bilaterally, normal work of breathing GI: soft, nontender, nondistended, + BS MS: no deformity or atrophy  Skin: warm and dry, no rash Neuro:  Strength and sensation are intact Psych: euthymic mood, full affect   EKG:  EKG is ordered today. The ekg ordered today demonstrates normal sinus rhythm with right bundle branch block.  No PVCs.   Recent Labs: 04/26/2019: ALT 21 12/22/2019: BUN 21; Creatinine, Ser 1.15; Hemoglobin 13.8; Platelets 228; Potassium 5.1; Sodium 138    Lipid Panel    Component Value Date/Time   CHOL 142 08/21/2019 0819   CHOL 178 08/28/2016 0806   TRIG 137.0 08/21/2019 0819   HDL 38.70 (L) 08/21/2019 0819   HDL 48 08/28/2016 0806   CHOLHDL 4 08/21/2019 0819   VLDL 27.4 08/21/2019 0819   LDLCALC 76 08/21/2019 0819   LDLCALC 106 (H) 08/28/2016 0806   LDLDIRECT 138.8 06/17/2011 0850      Wt Readings from Last 3 Encounters:  01/25/20 238 lb (108 kg)  01/11/20 240 lb 8 oz (109.1 kg)  01/08/20 241 lb 11.2 oz (109.6 kg)         ASSESSMENT AND PLAN:  1.  Chronic systolic heart failure: Most recent ejection fraction was 30 to 35% in the setting of increased PVCs.  Is currently New York Heart Association class II.  I elected to switch metoprolol tartrate to Toprol-XL 50 mg once daily.  Continue losartan given that he could not afford Entresto.  I added spironolactone 25 mg once daily.  Check basic metabolic profile in 1 week. I am  hopeful now that his PVCs are well controlled with  flecainide and with medical therapy, hopefully his ejection fraction will improve.  Repeat echocardiogram in 3 months.  2. Essential hypertension: Blood pressure is controlled on current medications  3. Hyperlipidemia: Continue treatment with atorvastatin.  Most recent lipid profile showed an LDL of 75.  4. Bilateral carotid disease: Repeat carotid Doppler in July 11, 2017 showed only mild nonobstructive disease.  Continue to follow clinically.  5.  Frequent PVCs: Responded very well to treatment with flecainide.  I discussed with Dr. Caryl Comes who reviewed the EKG.  Given improvement, will hold off on referral for ablation.  That is the patient's preference also.   Disposition:   FU with me in 3 months.  Signed,  [name], MD  01/25/2020 8:42 AM    [place]

## 2020-01-25 NOTE — Progress Notes (Unsigned)
ECG reviewed with B  3.  QRS duration 150 ms--178 ms on flecainide 50 twice daily.  No ectopy noted.  Continue.

## 2020-01-25 NOTE — Patient Instructions (Signed)
Medication Instructions:  Your physician has recommended you make the following change in your medication:   1) STOP Metoprolol Tartrate  2) START Metoprolol Succinate 50 mg daily. An Rx has been sent to your pharmacy  3) START Spironolactone 25 mg daily. An Rx has been sent to your pharmacy  *If you need a refill on your cardiac medications before your next appointment, please call your pharmacy*   Lab Work: Your physician recommends that you return for lab work in: 1 week (bmet)  Please have your lab drawn at the medical mall. You do not need an appointment. Their hours are Mon-Fri 7:30am-6pm.  If you have labs (blood work) drawn today and your tests are completely normal, you will receive your results only by: Marland Kitchen MyChart Message (if you have MyChart) OR . A paper copy in the mail If you have any lab test that is abnormal or we need to change your treatment, we will call you to review the results.   Testing/Procedures: Your physician has requested that you have an echocardiogram. Echocardiography is a painless test that uses sound waves to create images of your heart. It provides your doctor with information about the size and shape of your heart and how well your heart's chambers and valves are working. This procedure takes approximately one hour. There are no restrictions for this procedure. (To be scheduled in June 2021)    Follow-Up: At [facility], you and your health needs are our priority.  As part of our continuing mission to provide you with exceptional heart care, we have created designated Provider Care Teams.  These Care Teams include your primary Cardiologist (physician) and Advanced Practice Providers (APPs -  Physician Assistants and Nurse Practitioners) who all work together to provide you with the care you need, when you need it.  We recommend signing up for the patient portal called "MyChart".  Sign up information is provided on this After Visit Summary.   MyChart is used to connect with patients for Virtual Visits (Telemedicine).  Patients are able to view lab/test results, encounter notes, upcoming appointments, etc.  Non-urgent messages can be sent to your provider as well.   To learn more about what you can do with MyChart, go to [email or web address].    Your next appointment:   3 month(s) 1 week after the echo  The format for your next appointment:   In Person  Provider:    You may see [name], MD or one of the following Advanced Practice Providers on your designated Care Team:    Murray Hodgkins, NP  Christell Faith, PA-C  Marrianne Mood, PA-C    Other Instructions N/A

## 2020-01-29 ENCOUNTER — Observation Stay
Admission: EM | Admit: 2020-01-29 | Discharge: 2020-01-30 | Disposition: A | Discharge status: Home / Self Care | Payer: Medicare Other | Attending: Internal Medicine | Admitting: Internal Medicine

## 2020-01-29 ENCOUNTER — Encounter: Payer: Self-pay | Admitting: Emergency Medicine

## 2020-01-29 ENCOUNTER — Telehealth: Payer: Self-pay | Admitting: Cardiovascular Disease

## 2020-01-29 ENCOUNTER — Other Ambulatory Visit: Payer: Self-pay

## 2020-01-29 ENCOUNTER — Emergency Department: Payer: Medicare Other

## 2020-01-29 ENCOUNTER — Encounter: Payer: Medicare Other | Admitting: *Deleted

## 2020-01-29 DIAGNOSIS — I5042 Chronic combined systolic (congestive) and diastolic (congestive) heart failure: Secondary | ICD-10-CM | POA: Diagnosis not present

## 2020-01-29 DIAGNOSIS — I110 Hypertensive heart disease with heart failure: Secondary | ICD-10-CM | POA: Diagnosis not present

## 2020-01-29 DIAGNOSIS — Z79899 Other long term (current) drug therapy: Secondary | ICD-10-CM | POA: Insufficient documentation

## 2020-01-29 DIAGNOSIS — Z6830 Body mass index (BMI) 30.0-30.9, adult: Secondary | ICD-10-CM

## 2020-01-29 DIAGNOSIS — I4510 Unspecified right bundle-branch block: Secondary | ICD-10-CM | POA: Insufficient documentation

## 2020-01-29 DIAGNOSIS — I130 Hypertensive heart and chronic kidney disease with heart failure and stage 1 through stage 4 chronic kidney disease, or unspecified chronic kidney disease: Secondary | ICD-10-CM | POA: Insufficient documentation

## 2020-01-29 DIAGNOSIS — Z7984 Long term (current) use of oral hypoglycemic drugs: Secondary | ICD-10-CM | POA: Diagnosis not present

## 2020-01-29 DIAGNOSIS — E860 Dehydration: Secondary | ICD-10-CM | POA: Insufficient documentation

## 2020-01-29 DIAGNOSIS — E1122 Type 2 diabetes mellitus with diabetic chronic kidney disease: Secondary | ICD-10-CM | POA: Insufficient documentation

## 2020-01-29 DIAGNOSIS — N400 Enlarged prostate without lower urinary tract symptoms: Secondary | ICD-10-CM | POA: Insufficient documentation

## 2020-01-29 DIAGNOSIS — I5022 Chronic systolic (congestive) heart failure: Secondary | ICD-10-CM

## 2020-01-29 DIAGNOSIS — Z85820 Personal history of malignant melanoma of skin: Secondary | ICD-10-CM | POA: Diagnosis not present

## 2020-01-29 DIAGNOSIS — R55 Syncope and collapse: Secondary | ICD-10-CM | POA: Diagnosis not present

## 2020-01-29 DIAGNOSIS — N183 Chronic kidney disease, stage 3 unspecified: Secondary | ICD-10-CM | POA: Insufficient documentation

## 2020-01-29 DIAGNOSIS — Z87891 Personal history of nicotine dependence: Secondary | ICD-10-CM | POA: Insufficient documentation

## 2020-01-29 DIAGNOSIS — K219 Gastro-esophageal reflux disease without esophagitis: Secondary | ICD-10-CM | POA: Diagnosis not present

## 2020-01-29 DIAGNOSIS — E785 Hyperlipidemia, unspecified: Secondary | ICD-10-CM | POA: Diagnosis not present

## 2020-01-29 DIAGNOSIS — I428 Other cardiomyopathies: Secondary | ICD-10-CM | POA: Diagnosis not present

## 2020-01-29 DIAGNOSIS — K5790 Diverticulosis of intestine, part unspecified, without perforation or abscess without bleeding: Secondary | ICD-10-CM | POA: Diagnosis not present

## 2020-01-29 DIAGNOSIS — M1990 Unspecified osteoarthritis, unspecified site: Secondary | ICD-10-CM | POA: Insufficient documentation

## 2020-01-29 DIAGNOSIS — R002 Palpitations: Principal | ICD-10-CM | POA: Insufficient documentation

## 2020-01-29 DIAGNOSIS — Z20822 Contact with and (suspected) exposure to covid-19: Secondary | ICD-10-CM | POA: Insufficient documentation

## 2020-01-29 DIAGNOSIS — Z791 Long term (current) use of non-steroidal anti-inflammatories (NSAID): Secondary | ICD-10-CM | POA: Insufficient documentation

## 2020-01-29 DIAGNOSIS — Z6834 Body mass index (BMI) 34.0-34.9, adult: Secondary | ICD-10-CM | POA: Insufficient documentation

## 2020-01-29 DIAGNOSIS — R000 Tachycardia, unspecified: Secondary | ICD-10-CM

## 2020-01-29 DIAGNOSIS — F329 Major depressive disorder, single episode, unspecified: Secondary | ICD-10-CM | POA: Diagnosis not present

## 2020-01-29 DIAGNOSIS — E669 Obesity, unspecified: Secondary | ICD-10-CM | POA: Diagnosis not present

## 2020-01-29 DIAGNOSIS — N179 Acute kidney failure, unspecified: Secondary | ICD-10-CM | POA: Insufficient documentation

## 2020-01-29 DIAGNOSIS — Z7982 Long term (current) use of aspirin: Secondary | ICD-10-CM | POA: Diagnosis not present

## 2020-01-29 DIAGNOSIS — G4736 Sleep related hypoventilation in conditions classified elsewhere: Secondary | ICD-10-CM | POA: Insufficient documentation

## 2020-01-29 DIAGNOSIS — R0602 Shortness of breath: Secondary | ICD-10-CM | POA: Diagnosis not present

## 2020-01-29 LAB — BASIC METABOLIC PANEL
Anion gap: 14 (ref 5–15)
BUN: 23 mg/dL (ref 8–23)
CO2: 22 mmol/L (ref 22–32)
Calcium: 8.9 mg/dL (ref 8.9–10.3)
Chloride: 95 mmol/L — ABNORMAL LOW (ref 98–111)
Creatinine, Ser: 1.53 mg/dL — ABNORMAL HIGH (ref 0.61–1.24)
GFR calc Af Amer: 53 mL/min — ABNORMAL LOW (ref >60)
GFR calc non Af Amer: 46 mL/min — ABNORMAL LOW (ref >60)
Glucose, Bld: 276 mg/dL — ABNORMAL HIGH (ref 70–99)
Potassium: 4.6 mmol/L (ref 3.5–5.1)
Sodium: 131 mmol/L — ABNORMAL LOW (ref 135–145)

## 2020-01-29 LAB — HEMOGLOBIN A1C
Hgb A1c MFr Bld: 6.7 % — ABNORMAL HIGH (ref 4.8–5.6)
Mean Plasma Glucose: 145.59 mg/dL

## 2020-01-29 LAB — CBC WITH DIFFERENTIAL/PLATELET
Abs Immature Granulocytes: 0.02 10*3/uL (ref 0.00–0.07)
Basophils Absolute: 0 10*3/uL (ref 0.0–0.1)
Basophils Relative: 0 %
Eosinophils Absolute: 0.1 10*3/uL (ref 0.0–0.5)
Eosinophils Relative: 1 %
HCT: 42.2 % (ref 39.0–52.0)
Hemoglobin: 14.4 g/dL (ref 13.0–17.0)
Immature Granulocytes: 0 %
Lymphocytes Relative: 33 %
Lymphs Abs: 2.9 10*3/uL (ref 0.7–4.0)
MCH: 31.4 pg (ref 26.0–34.0)
MCHC: 34.1 g/dL (ref 30.0–36.0)
MCV: 91.9 fL (ref 80.0–100.0)
Monocytes Absolute: 0.6 10*3/uL (ref 0.1–1.0)
Monocytes Relative: 7 %
Neutro Abs: 5.3 10*3/uL (ref 1.7–7.7)
Neutrophils Relative %: 59 %
Platelets: 283 10*3/uL (ref 150–400)
RBC: 4.59 MIL/uL (ref 4.22–5.81)
RDW: 12.2 % (ref 11.5–15.5)
WBC: 9 10*3/uL (ref 4.0–10.5)
nRBC: 0 % (ref 0.0–0.2)

## 2020-01-29 LAB — GLUCOSE, CAPILLARY
Glucose-Capillary: 236 mg/dL — ABNORMAL HIGH (ref 70–99)
Glucose-Capillary: 72 mg/dL (ref 70–99)

## 2020-01-29 LAB — TROPONIN I (HIGH SENSITIVITY)
Troponin I (High Sensitivity): 11 ng/L (ref <18)
Troponin I (High Sensitivity): 10 ng/L (ref <18)

## 2020-01-29 LAB — TSH: TSH: 1.184 u[IU]/mL (ref 0.350–4.500)

## 2020-01-29 LAB — MAGNESIUM: Magnesium: 1.3 mg/dL — ABNORMAL LOW (ref 1.7–2.4)

## 2020-01-29 MED ORDER — MAGNESIUM SULFATE 2 GM/50ML IV SOLN
2.0000 g | Freq: Once | INTRAVENOUS | Status: AC
  Administered 2020-01-29: 2 g via INTRAVENOUS
  Filled 2020-01-29: qty 50

## 2020-01-29 MED ORDER — INSULIN ASPART 100 UNIT/ML ~~LOC~~ SOLN
0.0000 [IU] | Freq: Three times a day (TID) | SUBCUTANEOUS | Status: DC
  Administered 2020-01-30: 3 [IU] via SUBCUTANEOUS
  Filled 2020-01-29: qty 1

## 2020-01-29 MED ORDER — OXYMETAZOLINE HCL 0.05 % NA SOLN
1.0000 | Freq: Two times a day (BID) | NASAL | Status: DC | PRN
  Filled 2020-01-29: qty 15

## 2020-01-29 MED ORDER — LACTATED RINGERS IV BOLUS
500.0000 mL | Freq: Once | INTRAVENOUS | Status: AC
  Administered 2020-01-29: 500 mL via INTRAVENOUS

## 2020-01-29 MED ORDER — FLECAINIDE ACETATE 100 MG PO TABS
100.0000 mg | ORAL_TABLET | Freq: Two times a day (BID) | ORAL | Status: DC
  Filled 2020-01-29: qty 1

## 2020-01-29 MED ORDER — PAROXETINE HCL 20 MG PO TABS
20.0000 mg | ORAL_TABLET | Freq: Every day | ORAL | Status: DC
  Administered 2020-01-30: 20 mg via ORAL
  Filled 2020-01-29 (×2): qty 1

## 2020-01-29 MED ORDER — ONDANSETRON HCL 4 MG PO TABS: 4.0000 mg | ORAL_TABLET | Freq: Four times a day (QID) | ORAL | Status: DC | PRN

## 2020-01-29 MED ORDER — SODIUM CHLORIDE 0.9% FLUSH
3.0000 mL | Freq: Two times a day (BID) | INTRAVENOUS | Status: DC
  Administered 2020-01-30 (×2): 3 mL via INTRAVENOUS

## 2020-01-29 MED ORDER — ATORVASTATIN CALCIUM 20 MG PO TABS: 40.0000 mg | ORAL_TABLET | Freq: Every day | ORAL | Status: DC

## 2020-01-29 MED ORDER — ONDANSETRON HCL 4 MG/2ML IJ SOLN: 4.0000 mg | Freq: Four times a day (QID) | INTRAMUSCULAR | Status: DC | PRN

## 2020-01-29 MED ORDER — LOSARTAN POTASSIUM 50 MG PO TABS
50.0000 mg | ORAL_TABLET | Freq: Every day | ORAL | Status: DC
  Administered 2020-01-30: 50 mg via ORAL
  Filled 2020-01-29: qty 1

## 2020-01-29 MED ORDER — OMEGA-3 FATTY ACIDS 1000 MG PO CAPS: 1.0000 g | ORAL_CAPSULE | Freq: Two times a day (BID) | ORAL | Status: DC

## 2020-01-29 MED ORDER — SODIUM CHLORIDE 0.9% FLUSH: 3.0000 mL | INTRAVENOUS | Status: DC | PRN

## 2020-01-29 MED ORDER — PANTOPRAZOLE SODIUM 40 MG PO TBEC
40.0000 mg | DELAYED_RELEASE_TABLET | Freq: Every day | ORAL | Status: DC
  Administered 2020-01-30: 40 mg via ORAL
  Filled 2020-01-29: qty 1

## 2020-01-29 MED ORDER — INSULIN ASPART 100 UNIT/ML ~~LOC~~ SOLN
4.0000 [IU] | Freq: Three times a day (TID) | SUBCUTANEOUS | Status: DC
  Administered 2020-01-30: 4 [IU] via SUBCUTANEOUS
  Filled 2020-01-29 (×2): qty 1

## 2020-01-29 MED ORDER — SPIRONOLACTONE 25 MG PO TABS
25.0000 mg | ORAL_TABLET | Freq: Every day | ORAL | Status: DC
  Administered 2020-01-30: 25 mg via ORAL
  Filled 2020-01-29 (×2): qty 1

## 2020-01-29 MED ORDER — ASPIRIN EC 81 MG PO TBEC
81.0000 mg | DELAYED_RELEASE_TABLET | Freq: Every day | ORAL | Status: DC
  Administered 2020-01-30: 81 mg via ORAL
  Filled 2020-01-29: qty 1

## 2020-01-29 MED ORDER — ENOXAPARIN SODIUM 40 MG/0.4ML ~~LOC~~ SOLN
40.0000 mg | SUBCUTANEOUS | Status: DC
  Administered 2020-01-30: 40 mg via SUBCUTANEOUS
  Filled 2020-01-29: qty 0.4

## 2020-01-29 MED ORDER — SODIUM CHLORIDE 0.9 % IV SOLN: 250.0000 mL | INTRAVENOUS | Status: DC | PRN

## 2020-01-29 MED ORDER — METOPROLOL SUCCINATE ER 50 MG PO TB24
50.0000 mg | ORAL_TABLET | Freq: Every day | ORAL | Status: DC
  Administered 2020-01-30: 50 mg via ORAL
  Filled 2020-01-29: qty 1

## 2020-01-29 MED ORDER — OMEGA-3-ACID ETHYL ESTERS 1 G PO CAPS
1.0000 g | ORAL_CAPSULE | Freq: Two times a day (BID) | ORAL | Status: DC
  Administered 2020-01-30: 1 g via ORAL
  Filled 2020-01-29 (×3): qty 1

## 2020-01-29 NOTE — ED Triage Notes (Signed)
Pt presents to ED via wheelchair from cardiac rehab with c/o weakness and dizziness. Pt states this AM checked HR at home with noted HR at 160, pt states was at cardiac rehab, BP in the 80's with HR in the 160's, pt denies CP and SOB, states feels weak and dizzy, pt arrives to ED A&O x4, noted to be pale at first interaction, upon arrival to room 10 pt noted to have better coloring.

## 2020-01-29 NOTE — Progress Notes (Signed)
Incomplete Session Note  Patient Details  Name: Tim Mccullough MRN: [id number] Date of Birth: 26-Dec-1950 Referring Provider:     Cardiac Rehab from 01/08/2020 in [facility] Cardiac and Pulmonary Rehab  Referring Provider  [name] MD      [name] did not complete his rehab session.  [name] came in to class stating he did not feel well.  [name], HR up and down and weak.  Pulse oximetry showed HR 168. BP check 84/58  Blood sugar 236.  Called Dr Fletcher Anon office and let them know we were transporting [name] to the ED for assessment and treatment.

## 2020-01-29 NOTE — Telephone Encounter (Signed)
Patient currently in the ER.

## 2020-01-29 NOTE — ED Provider Notes (Signed)
[facility] Emergency Department Provider Note   ____________________________________________   First MD Initiated Contact with Patient 01/29/20 1339     (approximate)  I have reviewed the triage vital signs and the nursing notes.   HISTORY  Chief Complaint Tachycardia and Weakness    HPI Tim Mccullough is a 69 y.o. male with past medical history of hypertension, diabetes, nonischemic cardiomyopathy, CHF, and frequent PVCs on flecainide who presents to the ED for palpitations and lightheadedness.  Patient reports that he had sudden onset of feeling like his heart is racing as well as lightheadedness and near syncope while in the shower earlier today.  He has not had any associated chest pain or shortness of breath.  He attempted to go to his usual cardiac rehab appointment earlier today, but they noted his heart rate to be in the 160s with a low blood pressure.  He was subsequently referred to the ED for further evaluation, continues to feel lightheaded at this time.  He denies any history of abnormal heart rhythms, but does take flecainide for frequent PVCs.  He denies any history of atrial fibrillation or atrial flutter, has never had SVT.        Past Medical History:  Diagnosis Date  . Abnormal echocardiogram 2/11   Repeated after med mgmt of cardiomyopathy showed EF 40-45%, mildly dilated LV, mild LV hypertrophy, anterolateral and apical hypokinesis, mild diastolic dysfunction.  . Anemia    hx  . Arthritis   . Blood transfusion without reported diagnosis 09/2012  . BPH (benign prostatic hypertrophy) 2004  . Cancer ([place])    melanoma back  . Cardiomyopathy    ? tachycardia-mediated  . Depression   . Diabetes mellitus 11/2006   Type II  . Diverticulosis   . Echocardiogram abnormal 7/10   Mild LVH with a mildly dilated LV.  EF was difficult to assess given poor acoustic windows but appeared moderately decreased.  Mild MR.  Probable small  perimembranous VSD, mild LAE  . Esophageal erosions   . GERD (gastroesophageal reflux disease)   . Hip pain   . History of MRI 7/10   Cardiac MRI was done to followup difficult echo.  This showed mild to moderately dilated LV, mild to moderate LAE, EF  33% with global hypokinesis, normal RV size with mild systolic dysfunction.  There was no large VSD evident.  There was no myocardial delayed enhancement so no evidence for infiltrative disease or infarction.  LHC (7/10) showed only luminal irreg. in the coronaries and EF 35%  . Holter monitor, abnormal 7/10   Frequent PAC's and PVC's  Average HR 96  (but he had started Toprol XL).  HR range 71-130  . Hyperlipidemia 04/1998  . Hypertension 1995  . Obesity   . OSA (obstructive sleep apnea)    Not using CPAP- improved with weight loss  . Pneumonia    HX OF pna  . Sinus tachycardia    Pt says he has been told his heart rate is high since he was in high school.   . Sleep apnea    has cpap; but does not use  . VSD (ventricular septal defect)    Probable small perimembranous    Patient Active Problem List   Diagnosis Date Noted  . Taste disorder 04/30/2019  . Bradycardia 04/30/2019  . Tinea cruris 10/23/2018  . Intertrigo 07/20/2018  . Hand paresthesia 07/20/2018  . Healthcare maintenance 04/17/2018  . Back pain 04/17/2018  . Hand pain,  left 08/21/2015  . Abdominal wall hernia 08/21/2015  . OA (osteoarthritis) 02/18/2015  . Advance care planning 08/19/2014  . Medicare annual wellness visit, subsequent 07/30/2013  . Barrett's esophagus 01/17/2013  . Preop cardiovascular exam 03/15/2012  . Chronic combined systolic and diastolic congestive heart failure ([place]) 03/15/2012  . Vertigo 02/09/2012  . GERD 11/24/2010  . VSD 05/23/2009  . UNSPECIFIED TACHYCARDIA 04/22/2009  . OBESITY 12/17/2008  . Diabetes mellitus without complication ([place]) [id number]  . Hyperlipidemia associated with type 2 diabetes mellitus ([place]) 04/17/2007  .  Essential hypertension 04/17/2007  . BENIGN PROSTATIC HYPERTROPHY 04/17/2007  . ANXIETY DEPRESSION 04/15/2007  . ERECTILE DYSFUNCTION 04/15/2007  . Sleep apnea 04/15/2007  . HX, PERSONAL, TOBACCO USE 04/15/2007    Past Surgical History:  Procedure Laterality Date  . CARDIAC CATHETERIZATION  05/30/2009   Dilated cardiomyopathy.  min VSD.  Hypokin Inf  wall.  Multiple min luminal Irr.  EF 35%  . COLONOSCOPY W/ BIOPSIES  08/24/2006   Divertics, polyps x3, bx negative, repeat 2012 with mild diverticulosis in teh sigmoid colon and 1 polyp in tranverse colon, repeat due 2017  . ESOPHAGOGASTRODUODENOSCOPY  2012   1)Barrett's esoph, 2) Erosive esophagitis, 3) Mild gastritis in the antrum, 4) Duodenitis in the bulb of duodenum, 5) Small hiatal hernia  . INGUINAL HERNIA REPAIR  1985   Left  . INGUINAL HERNIA REPAIR  1999   Right  . KNEE ARTHROSCOPY  10/15/2011   Procedure: ARTHROSCOPY KNEE;  Surgeon: Ninetta Lights, MD;  Location: [facility];  Service: Orthopedics;  Laterality: Right;  right knee scope with lateral meniscectomy, removal loose foreign body, and microfracture technique  . LAMINECTOMY  1987   with discectomy  . RIGHT/LEFT HEART CATH AND CORONARY ANGIOGRAPHY N/A 12/25/2019   Procedure: RIGHT/LEFT HEART CATH AND CORONARY ANGIOGRAPHY;  Surgeon: Wellington Hampshire, MD;  Location: [place] [facility];  Service: Cardiovascular;  Laterality: N/A;  . ROTATOR CUFF REPAIR  2/11   rt  . TOTAL HIP ARTHROPLASTY  2011   Left  . TOTAL KNEE ARTHROPLASTY  06/01/2012   Procedure: TOTAL KNEE ARTHROPLASTY;  Surgeon: Ninetta Lights, MD;  Location: [facility];  Service: Orthopedics;  Laterality: Right;    Prior to Admission medications   Medication Sig Start Date End Date Taking? Authorizing Provider  aspirin 81 MG tablet Take 81 mg by mouth daily.     [provider]  atorvastatin (LIPITOR) 40 MG tablet TAKE 1 TABLET BY MOUTH EVERY DAY Patient taking differently: Take 40 mg by  mouth daily.  08/28/19   Tonia Ghent, MD  doxazosin (CARDURA) 1 MG tablet 1 tablet daily 04/28/19   Tonia Ghent, MD  fish oil-omega-3 fatty acids 1000 MG capsule Take 1 g by mouth 2 (two) times daily.     [provider]  flecainide (TAMBOCOR) 100 MG tablet Take one tablet (100 mg) by mouth twice daily as directed. 01/11/20   Deboraha Sprang, MD  glimepiride (AMARYL) 1 MG tablet 1 tab in the AM, 1 in the PM Patient taking differently: Take 1 mg by mouth in the morning and at bedtime.  11/28/19   Tonia Ghent, MD  Lancets [facility] ULTRASOFT) lancets USE DAILY AS NEEDED TO CHECK SUGAR, DX 250.00 07/22/15   Tonia Ghent, MD  losartan (COZAAR) 50 MG tablet Take 1 tablet (50 mg total) by mouth daily. 01/11/20   Deboraha Sprang, MD  meloxicam (MOBIC) 15 MG tablet TAKE 1 TABLET BY  MOUTH EVERY DAY Patient taking differently: Take 15 mg by mouth daily.  07/24/19   Tonia Ghent, MD  metFORMIN (GLUCOPHAGE) 500 MG tablet TAKE 2 TABLETS (1,000 MG TOTAL) BY MOUTH 2 (TWO) TIMES DAILY WITH A MEAL. 01/02/20   Tonia Ghent, MD  metoprolol succinate (TOPROL-XL) 50 MG 24 hr tablet Take 1 tablet (50 mg total) by mouth daily. Take with or immediately following a meal. 01/25/20 04/24/20  Wellington Hampshire, MD  omeprazole (PRILOSEC) 20 MG capsule Take 1 capsule (20 mg total) by mouth daily. 09/28/19   Ladene Artist, MD  ONE TOUCH ULTRA TEST test strip CHECK BLOOD SUGAR DAILY AND IF BLOOD SUGAR FLUCTUATES. 250.00 01/02/15   Tonia Ghent, MD  oxymetazoline (AFRIN) 0.05 % nasal spray Place 1 spray into both nostrils 2 (two) times daily as needed for congestion.    [provider]  PARoxetine (PAXIL) 20 MG tablet Take 1 tablet (20 mg total) by mouth daily. 04/28/19   Tonia Ghent, MD  spironolactone (ALDACTONE) 25 MG tablet Take 1 tablet (25 mg total) by mouth daily. 01/25/20 04/24/20  Wellington Hampshire, MD    Allergies Patient has no known allergies.  Family History  Problem  Relation Age of Onset  . Heart disease Father        CABG  . Stroke Father   . Diabetes Father   . Hypertension Mother   . Alzheimer's disease Mother   . Diabetes Sister   . Hypertension Sister   . Cancer Sister        uterine cancer  . Diabetes Brother   . Throat cancer Brother        Throat  . Esophageal cancer Brother   . Diabetes Sister   . Hypertension Sister   . Throat cancer Other        Throat  . Diabetes Other   . Alcohol abuse Other   . Diabetes Maternal Grandmother        Insulin  . Colon cancer Cousin   . Esophageal cancer Maternal Grandfather   . Drug abuse Neg Hx   . Prostate cancer Neg Hx   . Rectal cancer Neg Hx     Social History Social History   Tobacco Use  . Smoking status: Former Smoker    Years: 20.00    Types: Cigarettes    Quit date: 11/09/1998    Years since quitting: 21.2  . Smokeless tobacco: Former Systems developer    Quit date: 06/03/2007  . Tobacco comment: no alcohol since nov 12  Substance Use Topics  . Alcohol use: No    Alcohol/week: 0.0 standard drinks    Comment: sober as 09/2011  . Drug use: No    Review of Systems  Constitutional: No fever/chills Eyes: No visual changes. ENT: No sore throat. Cardiovascular: Denies chest pain.  Positive for palpitations.  Positive for lightheadedness and near syncope. Respiratory: Denies shortness of breath. Gastrointestinal: No abdominal pain.  No nausea, no vomiting.  No diarrhea.  No constipation. Genitourinary: Negative for dysuria. Musculoskeletal: Negative for back pain. Skin: Negative for rash. Neurological: Negative for headaches, focal weakness or numbness.  ____________________________________________   PHYSICAL EXAM:  VITAL SIGNS: ED Triage Vitals  Enc Vitals Group     BP 01/29/20 1339 106/72     Pulse Rate 01/29/20 1339 (!) 120     Resp 01/29/20 1339 (!) 28     Temp 01/29/20 1339 98 F (36.7 C)     Temp  Source 01/29/20 1339 Oral     SpO2 01/29/20 1339 96 %     Weight  01/29/20 1345 238 lb (108 kg)     Height 01/29/20 1345 5\' 10"  (1.778 m)     Head Circumference --      Peak Flow --      Pain Score 01/29/20 1345 0     Pain Loc --      Pain Edu? --      Excl. in [place]? --     Constitutional: Alert and oriented. Eyes: Conjunctivae are normal. Head: Atraumatic. Nose: No congestion/rhinnorhea. Mouth/Throat: Mucous membranes are moist. Neck: Normal ROM Cardiovascular: Tachycardic, regular rhythm. Grossly normal heart sounds. Respiratory: Normal respiratory effort.  No retractions. Lungs CTAB. Gastrointestinal: Soft and nontender. No distention. Genitourinary: deferred Musculoskeletal: No lower extremity tenderness nor edema. Neurologic:  Normal speech and language. No gross focal neurologic deficits are appreciated. Skin:  Skin is warm, dry and intact. No rash noted. Psychiatric: Mood and affect are normal. Speech and behavior are normal.  ____________________________________________   LABS (all labs ordered are listed, but only abnormal results are displayed)  Labs Reviewed  BASIC METABOLIC PANEL - Abnormal; Notable for the following components:      Result Value   Sodium 131 (*)    Chloride 95 (*)    Glucose, Bld 276 (*)    Creatinine, Ser 1.53 (*)    GFR calc non Af Amer 46 (*)    GFR calc Af Amer 53 (*)    All other components within normal limits  MAGNESIUM - Abnormal; Notable for the following components:   Magnesium 1.3 (*)    All other components within normal limits  SARS CORONAVIRUS 2 (TAT 6-24 HRS)  CBC WITH DIFFERENTIAL/PLATELET  TROPONIN I (HIGH SENSITIVITY)  TROPONIN I (HIGH SENSITIVITY)   ____________________________________________  EKG  ED ECG REPORT I, Blake Divine, the attending physician, personally viewed and interpreted this ECG.   Date: 01/29/2020  EKG Time: 13:29  Rate: 144  Rhythm: Sinus tach vs Aflutter vs SVT vs Vtach  Axis: RAD  Intervals:right bundle branch block  ST&T Change: None  ED ECG  REPORT I, Blake Divine, the attending physician, personally viewed and interpreted this ECG.   Date: 01/29/2020  EKG Time: 13:49  Rate: 116  Rhythm: sinus tachycardia vs atrial tachycardia vs SVT  Axis: RAD  Intervals:right bundle branch block and left posterior fascicular block  ST&T Change: None  ED ECG REPORT I, Blake Divine, the attending physician, personally viewed and interpreted this ECG.   Date: 01/29/2020  EKG Time: 14:10  Rate: 112  Rhythm: sinus tachycardia  Axis: RAD  Intervals:right bundle branch block and left posterior fascicular block  ST&T Change: None   PROCEDURES  Procedure(s) performed (including Critical Care):  .Critical Care Performed by: Blake Divine, MD Authorized by: Blake Divine, MD   Critical care provider statement:    Critical care time (minutes):  45   Critical care time was exclusive of:  Separately billable procedures and treating other patients and teaching time   Critical care was necessary to treat or prevent imminent or life-threatening deterioration of the following conditions:  Cardiac failure   Critical care was time spent personally by me on the following activities:  Discussions with consultants, evaluation of patient's response to treatment, examination of patient, ordering and performing treatments and interventions, ordering and review of laboratory studies, ordering and review of radiographic studies, pulse oximetry, re-evaluation of patient's condition, obtaining history from patient or  surrogate and review of old charts   I assumed direction of critical care for this patient from another provider in my specialty: no   .1-3 Lead EKG Interpretation Performed by: Blake Divine, MD Authorized by: Blake Divine, MD     Interpretation: non-specific     ECG rate:  96   ECG rate assessment: normal     Rhythm: sinus rhythm     Ectopy: none     Conduction: normal        ____________________________________________   INITIAL IMPRESSION / ASSESSMENT AND PLAN / ED COURSE       69 year old male with history of nonischemic cardiomyopathy and frequent PVCs on flecainide presents to the ED with palpitations and near syncope since this morning, noted to be tachycardic and hypotensive at cardiac rehab appointment.  On arrival here in the ED, he is noted to be tachycardic with heart rate in the 140s, but blood pressure is stable.  EKG shows wide complex tachycardia, however this appears most consistent with his known right bundle branch block and heart rate seems to be gradually improving.  Case was discussed with Dr. Fletcher Anon of cardiology, who also spoke with Dr. Caryl Comes of electrophysiology, and agreed that this does not represent ventricular tachycardia.  His lab work appears most consistent with dehydration given AKI and hypomagnesemia.  He was hydrated with IV fluids gently and magnesium was repleted.  Symptoms seem to be improving at this time and blood pressure remained stable, tachycardia gradually improving.  Cardiology recommends overnight observation on telemetry given significantly elevated heart rate and hypotension.  Case discussed with hospitalist, who accepts patient for admission.      ____________________________________________   FINAL CLINICAL IMPRESSION(S) / ED DIAGNOSES  Final diagnoses:  Palpitations  Near syncope     ED Discharge Orders    None       Note:  This document was prepared using Dragon voice recognition software and may include unintentional dictation errors.   Blake Divine, MD 01/29/20 [phone]

## 2020-01-29 NOTE — ED Notes (Signed)
Pt given a dinner tray to eat. Insulin not given as blood sugar is 76. Pt only takes metformin at home. Pt states 76 is low for him.

## 2020-01-29 NOTE — Telephone Encounter (Signed)
STAT if HR is under 50 or over 120 (normal HR is 60-100 beats per minute)  1) What is your heart rate? 140-160 per sue in rehab   2) Do you have a log of your heart rate readings (document readings)?  no  3) Do you have any other symptoms?  Doesn't feel well per rehab they are advising patient go to the ED and also declined same day 330 appt with NP

## 2020-01-29 NOTE — ED Notes (Signed)
Zoll pads placed on pt, per MD Jessup.

## 2020-01-29 NOTE — ED Notes (Signed)
Pt resting in bed.

## 2020-01-29 NOTE — H&P (Signed)
History and Physical    Tim Mccullough [id number] DOB: [birth year] DOA: 01/29/2020  PCP: Tonia Ghent, MD   Patient coming from: Home  I have personally briefly reviewed patient's old medical records in [place]  Chief Complaint:  Weakness                                  Dizziness  HPI: Tim Mccullough is a 69 y.o. male with medical history significant for chronic systolic failure (Last known LVEF 30 - 35%), status post recent left heart cath which showed normal coronaries.  Patient was recently started on flecainide by EP for frequent PVC's.  He also has a medical history significant for diabetes mellitus and essential hypertension.  He presents to the emergency room from cardiac rehab for evaluation of weakness, SOB and dizziness which started this morning while he was in the shower.  Patient states that he checked his heart rate and it was 160 bpm at home with a SBP in the 120's. He also checked his blood sugar which was normal. While at cardiac rehab he was found to be hypotensive with systolic blood pressure in the 80s and tachycardic with heart rate in the 160s so he was sent to the ER for evaluation.  He complained of palpitations but denied having any chest pain or shortness of breath.        ED Course: Patient was seen in the emergency room for evaluation of weakness and dizziness and upon arrival to the ER had a heart rate of 140 bpm.  Twelve-lead EKG showed a wide complex tachycardia.  Patient was noted to have worsening in his renal function and presumed to be dehydrated.  He received a 1 L fluid bolus and received magnesium supplementation as well.  Cardiology was consulted and they recommend observation to rule out further episodes of arrythmia  Review of Systems: As per HPI otherwise 10 point review of systems negative.    Past Medical History:  Diagnosis Date  . Abnormal echocardiogram 2/11   Repeated after med mgmt of cardiomyopathy showed EF 40-45%,  mildly dilated LV, mild LV hypertrophy, anterolateral and apical hypokinesis, mild diastolic dysfunction.  . Anemia    hx  . Arthritis   . Blood transfusion without reported diagnosis 09/2012  . BPH (benign prostatic hypertrophy) 2004  . Cancer ([place])    melanoma back  . Cardiomyopathy    ? tachycardia-mediated  . Depression   . Diabetes mellitus 11/2006   Type II  . Diverticulosis   . Echocardiogram abnormal 7/10   Mild LVH with a mildly dilated LV.  EF was difficult to assess given poor acoustic windows but appeared moderately decreased.  Mild MR.  Probable small perimembranous VSD, mild LAE  . Esophageal erosions   . GERD (gastroesophageal reflux disease)   . Hip pain   . History of MRI 7/10   Cardiac MRI was done to followup difficult echo.  This showed mild to moderately dilated LV, mild to moderate LAE, EF  33% with global hypokinesis, normal RV size with mild systolic dysfunction.  There was no large VSD evident.  There was no myocardial delayed enhancement so no evidence for infiltrative disease or infarction.  LHC (7/10) showed only luminal irreg. in the coronaries and EF 35%  . Holter monitor, abnormal 7/10   Frequent PAC's and PVC's  Average HR 96  (but he had started Toprol  XL).  HR range 71-130  . Hyperlipidemia 04/1998  . Hypertension 1995  . Obesity   . OSA (obstructive sleep apnea)    Not using CPAP- improved with weight loss  . Pneumonia    HX OF pna  . Sinus tachycardia    Pt says he has been told his heart rate is high since he was in high school.   . Sleep apnea    has cpap; but does not use  . VSD (ventricular septal defect)    Probable small perimembranous    Past Surgical History:  Procedure Laterality Date  . CARDIAC CATHETERIZATION  05/30/2009   Dilated cardiomyopathy.  min VSD.  Hypokin Inf  wall.  Multiple min luminal Irr.  EF 35%  . COLONOSCOPY W/ BIOPSIES  08/24/2006   Divertics, polyps x3, bx negative, repeat 2012 with mild diverticulosis in teh  sigmoid colon and 1 polyp in tranverse colon, repeat due 2017  . ESOPHAGOGASTRODUODENOSCOPY  2012   1)Barrett's esoph, 2) Erosive esophagitis, 3) Mild gastritis in the antrum, 4) Duodenitis in the bulb of duodenum, 5) Small hiatal hernia  . INGUINAL HERNIA REPAIR  1985   Left  . INGUINAL HERNIA REPAIR  1999   Right  . KNEE ARTHROSCOPY  10/15/2011   Procedure: ARTHROSCOPY KNEE;  Surgeon: Ninetta Lights, MD;  Location: [facility];  Service: Orthopedics;  Laterality: Right;  right knee scope with lateral meniscectomy, removal loose foreign body, and microfracture technique  . LAMINECTOMY  1987   with discectomy  . RIGHT/LEFT HEART CATH AND CORONARY ANGIOGRAPHY N/A 12/25/2019   Procedure: RIGHT/LEFT HEART CATH AND CORONARY ANGIOGRAPHY;  Surgeon: Wellington Hampshire, MD;  Location: [facility];  Service: Cardiovascular;  Laterality: N/A;  . ROTATOR CUFF REPAIR  2/11   rt  . TOTAL HIP ARTHROPLASTY  2011   Left  . TOTAL KNEE ARTHROPLASTY  06/01/2012   Procedure: TOTAL KNEE ARTHROPLASTY;  Surgeon: Ninetta Lights, MD;  Location: [facility];  Service: Orthopedics;  Laterality: Right;     reports that he quit smoking about 21 years ago. His smoking use included cigarettes. He quit after 20.00 years of use. He quit smokeless tobacco use about 12 years ago. He reports that he does not drink alcohol or use drugs.  No Known Allergies  Family History  Problem Relation Age of Onset  . Heart disease Father        CABG  . Stroke Father   . Diabetes Father   . Hypertension Mother   . Alzheimer's disease Mother   . Diabetes Sister   . Hypertension Sister   . Cancer Sister        uterine cancer  . Diabetes Brother   . Throat cancer Brother        Throat  . Esophageal cancer Brother   . Diabetes Sister   . Hypertension Sister   . Throat cancer Other        Throat  . Diabetes Other   . Alcohol abuse Other   . Diabetes Maternal Grandmother        Insulin  . Colon cancer Cousin    . Esophageal cancer Maternal Grandfather   . Drug abuse Neg Hx   . Prostate cancer Neg Hx   . Rectal cancer Neg Hx      Prior to Admission medications   Medication Sig Start Date End Date Taking? Authorizing Provider  aspirin 81 MG tablet Take 81 mg by mouth daily.  [provider]  atorvastatin (LIPITOR) 40 MG tablet TAKE 1 TABLET BY MOUTH EVERY DAY Patient taking differently: Take 40 mg by mouth daily.  08/28/19   Tonia Ghent, MD  doxazosin (CARDURA) 1 MG tablet 1 tablet daily 04/28/19   Tonia Ghent, MD  fish oil-omega-3 fatty acids 1000 MG capsule Take 1 g by mouth 2 (two) times daily.     [provider]  flecainide (TAMBOCOR) 100 MG tablet Take one tablet (100 mg) by mouth twice daily as directed. 01/11/20   Deboraha Sprang, MD  glimepiride (AMARYL) 1 MG tablet 1 tab in the AM, 1 in the PM Patient taking differently: Take 1 mg by mouth in the morning and at bedtime.  11/28/19   Tonia Ghent, MD  Lancets [facility] ULTRASOFT) lancets USE DAILY AS NEEDED TO CHECK SUGAR, DX 250.00 07/22/15   Tonia Ghent, MD  losartan (COZAAR) 50 MG tablet Take 1 tablet (50 mg total) by mouth daily. 01/11/20   Deboraha Sprang, MD  meloxicam (MOBIC) 15 MG tablet TAKE 1 TABLET BY MOUTH EVERY DAY Patient taking differently: Take 15 mg by mouth daily.  07/24/19   Tonia Ghent, MD  metFORMIN (GLUCOPHAGE) 500 MG tablet TAKE 2 TABLETS (1,000 MG TOTAL) BY MOUTH 2 (TWO) TIMES DAILY WITH A MEAL. 01/02/20   Tonia Ghent, MD  metoprolol succinate (TOPROL-XL) 50 MG 24 hr tablet Take 1 tablet (50 mg total) by mouth daily. Take with or immediately following a meal. 01/25/20 04/24/20  Wellington Hampshire, MD  omeprazole (PRILOSEC) 20 MG capsule Take 1 capsule (20 mg total) by mouth daily. 09/28/19   Ladene Artist, MD  ONE TOUCH ULTRA TEST test strip CHECK BLOOD SUGAR DAILY AND IF BLOOD SUGAR FLUCTUATES. 250.00 01/02/15   Tonia Ghent, MD  oxymetazoline (AFRIN) 0.05 % nasal spray  Place 1 spray into both nostrils 2 (two) times daily as needed for congestion.    [provider]  PARoxetine (PAXIL) 20 MG tablet Take 1 tablet (20 mg total) by mouth daily. 04/28/19   Tonia Ghent, MD  spironolactone (ALDACTONE) 25 MG tablet Take 1 tablet (25 mg total) by mouth daily. 01/25/20 04/24/20  Wellington Hampshire, MD    Physical Exam: Vitals:   01/29/20 1339 01/29/20 1345 01/29/20 1430 01/29/20 1500  BP: 106/72  99/74 (!) 113/94  Pulse: (!) 120  (!) 113 (!) 112  Resp: (!) 28  20 19   Temp: 98 F (36.7 C)     TempSrc: Oral     SpO2: 96%  92% 94%  Weight:  108 kg    Height:  5\' 10"  (1.778 m)       Vitals:   01/29/20 1339 01/29/20 1345 01/29/20 1430 01/29/20 1500  BP: 106/72  99/74 (!) 113/94  Pulse: (!) 120  (!) 113 (!) 112  Resp: (!) 28  20 19   Temp: 98 F (36.7 C)     TempSrc: Oral     SpO2: 96%  92% 94%  Weight:  108 kg    Height:  5\' 10"  (1.778 m)      Constitutional: NAD, alert and oriented x 3 Eyes: PERRL, lids and conjunctivae normal ENMT: Mucous membranes are moist.  Neck: normal, supple, no masses, no thyromegaly Respiratory: clear to auscultation bilaterally, no wheezing, no crackles. Normal respiratory effort. No accessory muscle use.  Cardiovascular: Regular rate and rhythm, no murmurs / rubs gallops. No extremity edema. 2+ pedal pulses. No carotid  bruits.  Abdomen: no tenderness, no masses palpated. No hepatosplenomegaly. Bowel sounds positive.  Musculoskeletal: no clubbing / cyanosis. No joint deformity upper and lower extremities.  Skin: no rashes, lesions, ulcers.  Neurologic: No gross focal neurologic deficit. Psychiatric: Normal mood and affect.   Labs on Admission: I have personally reviewed following labs and imaging studies  CBC: Recent Labs  Lab 01/29/20 1339  WBC 9.0  NEUTROABS 5.3  HGB 14.4  HCT 42.2  MCV 91.9  PLT [id number]   Basic Metabolic Panel: Recent Labs  Lab 01/29/20 1339  NA 131*  K 4.6  CL 95*  CO2 22   GLUCOSE 276*  BUN 23  CREATININE 1.53*  CALCIUM 8.9  MG 1.3*   GFR: Estimated Creatinine Clearance: 56.9 mL/min (A) (by C-G formula based on SCr of 1.53 mg/dL (H)). Liver Function Tests: No results for input(s): AST, ALT, ALKPHOS, BILITOT, PROT, ALBUMIN in the last 168 hours. No results for input(s): LIPASE, AMYLASE in the last 168 hours. No results for input(s): AMMONIA in the last 168 hours. Coagulation Profile: No results for input(s): INR, PROTIME in the last 168 hours. Cardiac Enzymes: No results for input(s): CKTOTAL, CKMB, CKMBINDEX, TROPONINI in the last 168 hours. BNP (last 3 results) No results for input(s): PROBNP in the last 8760 hours. HbA1C: No results for input(s): HGBA1C in the last 72 hours. CBG: Recent Labs  Lab 01/29/20 1312  GLUCAP 236*   Lipid Profile: No results for input(s): CHOL, HDL, LDLCALC, TRIG, CHOLHDL, LDLDIRECT in the last 72 hours. Thyroid Function Tests: No results for input(s): TSH, T4TOTAL, FREET4, T3FREE, THYROIDAB in the last 72 hours. Anemia Panel: No results for input(s): VITAMINB12, FOLATE, FERRITIN, TIBC, IRON, RETICCTPCT in the last 72 hours. Urine analysis:    Component Value Date/Time   COLORURINE YELLOW 05/25/2012 1120   APPEARANCEUR CLEAR 05/25/2012 1120   LABSPEC 1.021 05/25/2012 1120   PHURINE 5.5 05/25/2012 1120   GLUCOSEU 250 (A) 05/25/2012 1120   HGBUR NEGATIVE 05/25/2012 1120   BILIRUBINUR NEGATIVE 05/25/2012 1120   BILIRUBINUR Neg 03/16/2011 1458   KETONESUR NEGATIVE 05/25/2012 1120   PROTEINUR NEGATIVE 05/25/2012 1120   UROBILINOGEN 1.0 05/25/2012 1120   NITRITE NEGATIVE 05/25/2012 1120   LEUKOCYTESUR NEGATIVE 05/25/2012 1120    Radiological Exams on Admission: DG Chest Portable 1 View  Result Date: 01/29/2020 CLINICAL DATA:  Shortness of breath and dizziness, initial encounter EXAM: PORTABLE CHEST 1 VIEW COMPARISON:  05/25/2012 FINDINGS: The heart size and mediastinal contours are within normal limits. Both  lungs are clear. The visualized skeletal structures are unremarkable. IMPRESSION: No active disease. Electronically Signed   By: Inez Catalina M.D.   On: 01/29/2020 14:29    EKG: Independently reviewed.  Sinus tachycardia RBBB  Assessment/Plan Principal Problem:   Near syncope Active Problems:   OBESITY   UNSPECIFIED TACHYCARDIA   Chronic combined systolic and diastolic congestive heart failure (HCC)   Type 2 diabetes mellitus with stage 3 chronic kidney disease (HCC)    Near syncope Secondary to unspecified tachycardia which has resolved Patient has a history of PVCs and was recently started on flecainide Tachycardia has resolved and BP is stable following IVF hydration Electrolytes revealed hypomagnesemia which has been supplemented Patient will be placed on telemetry overnight for monitoring Will consult cardiology   Obesity Placate overall prognosis and care   Diabetes mellitus with complications of stage III chronic kidney disease Place patient on consistent carbohydrate diet Sliding scale coverage   Chronic combined systolic and diastolic dysfunction CHF  Stable and not in acute exacerbation Continue Metoprolol and Cozaar  DVT prophylaxis: Lovenox Code Status: Full Family Communication: Plan of care was discussed with patient at the bedside and verbalizes understanding and agrees with the plan Disposition Plan: Back to previous home environment Consults called: Cardiology    Collier Bullock MD [facility]     01/29/2020, 5:33 PM

## 2020-01-29 NOTE — ED Notes (Signed)
Pt notified that he would remain in the er for the evening due to hospital census. TV turned on for pt. Pt denies any other needs at this time.

## 2020-01-30 DIAGNOSIS — I10 Essential (primary) hypertension: Secondary | ICD-10-CM | POA: Diagnosis not present

## 2020-01-30 DIAGNOSIS — R55 Syncope and collapse: Secondary | ICD-10-CM | POA: Diagnosis not present

## 2020-01-30 DIAGNOSIS — I5022 Chronic systolic (congestive) heart failure: Secondary | ICD-10-CM

## 2020-01-30 DIAGNOSIS — R002 Palpitations: Secondary | ICD-10-CM

## 2020-01-30 DIAGNOSIS — E1121 Type 2 diabetes mellitus with diabetic nephropathy: Secondary | ICD-10-CM

## 2020-01-30 DIAGNOSIS — N1831 Chronic kidney disease, stage 3a: Secondary | ICD-10-CM | POA: Diagnosis not present

## 2020-01-30 DIAGNOSIS — R000 Tachycardia, unspecified: Secondary | ICD-10-CM | POA: Diagnosis not present

## 2020-01-30 DIAGNOSIS — R42 Dizziness and giddiness: Secondary | ICD-10-CM | POA: Diagnosis not present

## 2020-01-30 LAB — CBC
HCT: 40.5 % (ref 39.0–52.0)
Hemoglobin: 13.7 g/dL (ref 13.0–17.0)
MCH: 31.6 pg (ref 26.0–34.0)
MCHC: 33.8 g/dL (ref 30.0–36.0)
MCV: 93.3 fL (ref 80.0–100.0)
Platelets: 245 10*3/uL (ref 150–400)
RBC: 4.34 MIL/uL (ref 4.22–5.81)
RDW: 12.2 % (ref 11.5–15.5)
WBC: 7.2 10*3/uL (ref 4.0–10.5)
nRBC: 0 % (ref 0.0–0.2)

## 2020-01-30 LAB — BASIC METABOLIC PANEL
Anion gap: 9 (ref 5–15)
BUN: 24 mg/dL — ABNORMAL HIGH (ref 8–23)
CO2: 27 mmol/L (ref 22–32)
Calcium: 8.9 mg/dL (ref 8.9–10.3)
Chloride: 103 mmol/L (ref 98–111)
Creatinine, Ser: 1.32 mg/dL — ABNORMAL HIGH (ref 0.61–1.24)
GFR calc Af Amer: >60 mL/min (ref >60)
GFR calc non Af Amer: 55 mL/min — ABNORMAL LOW (ref >60)
Glucose, Bld: 220 mg/dL — ABNORMAL HIGH (ref 70–99)
Potassium: 4.7 mmol/L (ref 3.5–5.1)
Sodium: 139 mmol/L (ref 135–145)

## 2020-01-30 LAB — GLUCOSE, CAPILLARY
Glucose-Capillary: 178 mg/dL — ABNORMAL HIGH (ref 70–99)
Glucose-Capillary: 109 mg/dL — ABNORMAL HIGH (ref 70–99)

## 2020-01-30 LAB — MAGNESIUM: Magnesium: 1.9 mg/dL (ref 1.7–2.4)

## 2020-01-30 LAB — HIV ANTIBODY (ROUTINE TESTING W REFLEX): HIV Screen 4th Generation wRfx: NONREACTIVE

## 2020-01-30 LAB — SARS CORONAVIRUS 2 (TAT 6-24 HRS): SARS Coronavirus 2: NEGATIVE

## 2020-01-30 MED ORDER — METOPROLOL SUCCINATE ER 100 MG PO TB24: 100.0000 mg | ORAL_TABLET | Freq: Every day | ORAL | Status: DC

## 2020-01-30 MED ORDER — METOPROLOL SUCCINATE ER 100 MG PO TB24: 100.0000 mg | ORAL_TABLET | Freq: Every day | ORAL | 0 refills | Status: DC

## 2020-01-30 MED ORDER — MAGNESIUM OXIDE 400 (241.3 MG) MG PO TABS
400.0000 mg | ORAL_TABLET | Freq: Two times a day (BID) | ORAL | Status: DC
  Administered 2020-01-30: 400 mg via ORAL
  Filled 2020-01-30: qty 1

## 2020-01-30 MED ORDER — LOSARTAN POTASSIUM 25 MG PO TABS: 25.0000 mg | ORAL_TABLET | Freq: Every day | ORAL | 0 refills | Status: DC

## 2020-01-30 MED ORDER — LOSARTAN POTASSIUM 25 MG PO TABS: 25.0000 mg | ORAL_TABLET | Freq: Every day | ORAL | Status: DC

## 2020-01-30 MED ORDER — MAGNESIUM OXIDE 400 (241.3 MG) MG PO TABS: 400.0000 mg | ORAL_TABLET | Freq: Every day | ORAL | 0 refills | Status: DC

## 2020-01-30 NOTE — Discharge Summary (Signed)
[facility] NAME: Tim Mccullough    MR#:  [id number]  DATE OF BIRTH:  08-Aug-1951  DATE OF ADMISSION:  01/29/2020 ADMITTING PHYSICIAN: Collier Bullock, MD  DATE OF DISCHARGE: 01/30/2020  PRIMARY CARE PHYSICIAN: Tonia Ghent, MD    ADMISSION DIAGNOSIS:  Palpitations [R00.2] Near syncope [R55]  DISCHARGE DIAGNOSIS:  Principal Problem:   Near syncope Active Problems:   OBESITY   UNSPECIFIED TACHYCARDIA   Chronic combined systolic and diastolic congestive heart failure (HCC)   Type 2 diabetes mellitus with stage 3 chronic kidney disease ([place])   SECONDARY DIAGNOSIS:   Past Medical History:  Diagnosis Date  . Abnormal echocardiogram 2/11   Repeated after med mgmt of cardiomyopathy showed EF 40-45%, mildly dilated LV, mild LV hypertrophy, anterolateral and apical hypokinesis, mild diastolic dysfunction.  . Anemia    hx  . Arthritis   . Blood transfusion without reported diagnosis 09/2012  . BPH (benign prostatic hypertrophy) 2004  . Cancer ([place])    melanoma back  . Cardiomyopathy    ? tachycardia-mediated  . Depression   . Diabetes mellitus 11/2006   Type II  . Diverticulosis   . Echocardiogram abnormal 7/10   Mild LVH with a mildly dilated LV.  EF was difficult to assess given poor acoustic windows but appeared moderately decreased.  Mild MR.  Probable small perimembranous VSD, mild LAE  . Esophageal erosions   . GERD (gastroesophageal reflux disease)   . Hip pain   . History of MRI 7/10   Cardiac MRI was done to followup difficult echo.  This showed mild to moderately dilated LV, mild to moderate LAE, EF  33% with global hypokinesis, normal RV size with mild systolic dysfunction.  There was no large VSD evident.  There was no myocardial delayed enhancement so no evidence for infiltrative disease or infarction.  LHC (7/10) showed only luminal irreg. in the coronaries and EF 35%  . Holter monitor, abnormal 7/10    Frequent PAC's and PVC's  Average HR 96  (but he had started Toprol XL).  HR range 71-130  . Hyperlipidemia 04/1998  . Hypertension 1995  . Obesity   . OSA (obstructive sleep apnea)    Not using CPAP- improved with weight loss  . Pneumonia    HX OF pna  . Sinus tachycardia    Pt says he has been told his heart rate is high since he was in high school.   . Sleep apnea    has cpap; but does not use  . VSD (ventricular septal defect)    Probable small perimembranous    HOSPITAL COURSE:   1.  Palpitations and near syncope.  Case discussed with cardiology and they are increasing the Toprol and stopping the flecainide.  Case discussed with Dr. Saunders Revel cardiology.  They will set up with Dr. Caryl Comes cardiology as outpatient.  Patient feeling better today than yesterday.  Heart rate better controlled. 2.  Hypomagnesemia magnesium very low on presentation given magnesium IV yesterday and I will give oral magnesium today and upon discharge. 3.  Type 2 diabetes mellitus with good control.  Patient states he gets low sugars at home.  DC Amaryl.  Continue Glucophage. 4.  Essential hypertension.  Decrease losartan dose increased Toprol dose.  Continue other medications. 5.  Dehydration with acute kidney injury on chronic kidney disease stage III.  Follow-up labs as outpatient. 6.  Hyperlipidemia on Lipitor 7.  GERD on Prilosec 8.  Depression on Paxil 9.  Chronic systolic congestive heart failure.  No signs of heart failure currently.  DISCHARGE CONDITIONS:   Satisfactory  CONSULTS OBTAINED:  Treatment Team:  Wellington Hampshire, MD  DRUG ALLERGIES:  No Known Allergies  DISCHARGE MEDICATIONS:   Allergies as of 01/30/2020   No Known Allergies     Medication List    STOP taking these medications   flecainide 100 MG tablet Commonly known as: TAMBOCOR   glimepiride 1 MG tablet Commonly known as: Amaryl   meloxicam 15 MG tablet Commonly known as: MOBIC   onetouch ultrasoft lancets    oxymetazoline 0.05 % nasal spray Commonly known as: AFRIN     TAKE these medications   aspirin 81 MG tablet Take 81 mg by mouth daily.   atorvastatin 40 MG tablet Commonly known as: LIPITOR TAKE 1 TABLET BY MOUTH EVERY DAY What changed:  how much to take how to take this when to take this additional instructions   doxazosin 1 MG tablet Commonly known as: CARDURA 1 tablet daily   fish oil-omega-3 fatty acids 1000 MG capsule Take 1 g by mouth 2 (two) times daily.   losartan 25 MG tablet Commonly known as: COZAAR Take 1 tablet (25 mg total) by mouth daily. Start taking on: January 31, 2020 What changed:  medication strength how much to take   magnesium oxide 400 (241.3 Mg) MG tablet Commonly known as: MAG-OX Take 1 tablet (400 mg total) by mouth daily.   metFORMIN 500 MG tablet Commonly known as: GLUCOPHAGE TAKE 2 TABLETS (1,000 MG TOTAL) BY MOUTH 2 (TWO) TIMES DAILY WITH A MEAL.   metoprolol succinate 100 MG 24 hr tablet Commonly known as: TOPROL-XL Take 1 tablet (100 mg total) by mouth daily. Take with or immediately following a meal. Start taking on: January 31, 2020 What changed:  medication strength how much to take   omeprazole 20 MG capsule Commonly known as: PRILOSEC Take 1 capsule (20 mg total) by mouth daily.   ONE TOUCH ULTRA TEST test strip Generic drug: glucose blood CHECK BLOOD SUGAR DAILY AND IF BLOOD SUGAR FLUCTUATES. 250.00   PARoxetine 20 MG tablet Commonly known as: PAXIL Take 1 tablet (20 mg total) by mouth daily.   spironolactone 25 MG tablet Commonly known as: ALDACTONE Take 1 tablet (25 mg total) by mouth daily.   triamcinolone ointment 0.1 % Commonly known as: KENALOG Apply 1 application topically 2 (two) times daily as needed.        DISCHARGE INSTRUCTIONS:   Follow-up Dr. Caryl Comes EP specialist on Thursday Follow-up PCP 5 days  If you experience worsening of your admission symptoms, develop shortness of breath, life  threatening emergency, suicidal or homicidal thoughts you must seek medical attention immediately by calling 911 or calling your MD immediately  if symptoms less severe.  You Must read complete instructions/literature along with all the possible adverse reactions/side effects for all the Medicines you take and that have been prescribed to you. Take any new Medicines after you have completely understood and accept all the possible adverse reactions/side effects.   Please note  You were cared for by a hospitalist during your hospital stay. If you have any questions about your discharge medications or the care you received while you were in the hospital after you are discharged, you can call the unit and asked to speak with the hospitalist on call if the hospitalist that took care of you is not available. Once you are discharged, your primary  care physician will handle any further medical issues. Please note that NO REFILLS for any discharge medications will be authorized once you are discharged, as it is imperative that you return to your primary care physician (or establish a relationship with a primary care physician if you do not have one) for your aftercare needs so that they can reassess your need for medications and monitor your lab values.    Today   CHIEF COMPLAINT:   Chief Complaint  Patient presents with  . Tachycardia  . Weakness    HISTORY OF PRESENT ILLNESS:  Tim Mccullough  is a 69 y.o. male came in with weakness and fast heart rate and low blood pressure.   VITAL SIGNS:  Blood pressure (!) 143/101, pulse (!) 105, temperature 98.5 F (36.9 C), temperature source Oral, resp. rate 18, height 5\' 10"  (1.778 m), weight 106.4 kg, SpO2 97 %.   PHYSICAL EXAMINATION:  GENERAL:  69 y.o.-year-old patient lying in the bed with no acute distress.  EYES: Pupils equal, round, reactive to light and accommodation. No scleral icterus. HEENT: Head atraumatic, normocephalic.  LUNGS: Normal  breath sounds bilaterally, no wheezing, rales,rhonchi or crepitation. No use of accessory muscles of respiration.  CARDIOVASCULAR: S1, S2 tachycardia. No murmurs, rubs, or gallops.  ABDOMEN: Soft, non-tender, non-distended. Bowel sounds present. No organomegaly or mass.  EXTREMITIES: No pedal edema, cyanosis, or clubbing.  NEUROLOGIC: Cranial nerves II through XII are intact. Muscle strength 5/5 in all extremities. Sensation intact. Gait not checked.  PSYCHIATRIC: The patient is alert and oriented x 3.  SKIN: No obvious rash, lesion, or ulcer.   DATA REVIEW:   CBC Recent Labs  Lab 01/30/20 1031  WBC 7.2  HGB 13.7  HCT 40.5  PLT 245    Chemistries  Recent Labs  Lab 01/30/20 1031  NA 139  K 4.7  CL 103  CO2 27  GLUCOSE 220*  BUN 24*  CREATININE 1.32*  CALCIUM 8.9  MG 1.9    Microbiology Results  Results for orders placed or performed during the hospital encounter of 01/29/20  SARS CORONAVIRUS 2 (TAT 6-24 HRS) Nasopharyngeal Nasopharyngeal Swab     Status: None   Collection Time: 01/29/20  3:48 PM   Specimen: Nasopharyngeal Swab  Result Value Ref Range Status   SARS Coronavirus 2 NEGATIVE NEGATIVE Final    Comment: (NOTE) SARS-CoV-2 target nucleic acids are NOT DETECTED. The SARS-CoV-2 RNA is generally detectable in upper and lower respiratory specimens during the acute phase of infection. Negative results do not preclude SARS-CoV-2 infection, do not rule out co-infections with other pathogens, and should not be used as the sole basis for treatment or other patient management decisions. Negative results must be combined with clinical observations, patient history, and epidemiological information. The expected result is Negative. Fact Sheet for Patients: SugarRoll.be Fact Sheet for Healthcare Providers: https://www.woods-mathews.com/ This test is not yet approved or cleared by the [place] FDA and  has been authorized  for detection and/or diagnosis of SARS-CoV-2 by FDA under an Emergency Use Authorization (EUA). This EUA will remain  in effect (meaning this test can be used) for the duration of the COVID-19 declaration under Section 56 4(b)(1) of the Act, 21 U.S.C. section 360bbb-3(b)(1), unless the authorization is terminated or revoked sooner. Performed at [facility], [place] [address], [place], [place] [address]     RADIOLOGY:  DG Chest Portable 1 View  Result Date: 01/29/2020 CLINICAL DATA:  Shortness of breath and dizziness, initial encounter EXAM:  PORTABLE CHEST 1 VIEW COMPARISON:  05/25/2012 FINDINGS: The heart size and mediastinal contours are within normal limits. Both lungs are clear. The visualized skeletal structures are unremarkable. IMPRESSION: No active disease. Electronically Signed   By: Inez Catalina M.D.   On: 01/29/2020 14:29     Management plans discussed with the patient, family and they are in agreement.  CODE STATUS:     Code Status Orders  (From admission, onward)         Start     Ordered   01/29/20 1624  Full code  Continuous     01/29/20 1634        Code Status History    Date Active Date Inactive Code Status Order ID Comments User Context   12/25/2019 1434 12/25/2019 2036 Full Code [id number]  Wellington Hampshire, MD Inpatient   Advance Care Planning Activity      TOTAL TIME TAKING CARE OF THIS PATIENT: 34 minutes.    Loletha Grayer M.D on 01/30/2020 at 4:09 PM  Between 7am to 6pm - Pager - [phone]  After 6pm go to [email or web address] - password [email or web address]  [facility]  CC: Primary care physician; Tonia Ghent, MD

## 2020-01-30 NOTE — ED Notes (Signed)
Patient is resting comfortably. 

## 2020-01-30 NOTE — Discharge Instructions (Signed)
Near-Syncope Near-syncope is when you suddenly feel like you might pass out (faint), but you do not actually lose consciousness. This may also be referred to as presyncope. During an episode of near-syncope, you may:  Feel dizzy, weak, or light-headed.  Feel nauseous.  See all white or all black in your field of vision, or see spots.  Have cold, clammy skin. This condition is caused by a sudden decrease in blood flow to the brain. This decrease can result from various causes, but most of those causes are not dangerous. However, near-syncope may be a sign of a serious medical problem, so it is important to seek medical care. Follow these instructions at home: Medicines  Take over-the-counter and prescription medicines only as told by your health care provider.  If you are taking blood pressure or heart medicine, get up slowly and take several minutes to sit and then stand. This can reduce dizziness. General instructions  Pay attention to any changes in your symptoms.  Talk with your health care provider about your symptoms. You may need to have testing to understand the cause of your near-syncope.  If you start to feel like you might faint, lie down right away and raise (elevate) your feet above the level of your heart. Breathe deeply and steadily. Wait until all of the symptoms have passed.  Have someone stay with you until you feel stable.  Do not drive, use machinery, or play sports until your health care provider says it is okay.  Drink enough fluid to keep your urine pale yellow.  Keep all follow-up visits as told by your health care provider. This is important. Get help right away if you:  Have a seizure.  Have unusual pain in your chest, abdomen, or back.  Faint once or repeatedly.  Have a severe headache.  Are bleeding from your mouth or rectum, or you have black or tarry stool.  Have a very fast or irregular heartbeat (palpitations).  Are confused.  Have  trouble walking.  Have severe weakness.  Have vision problems. These symptoms may represent a serious problem that is an emergency. Do not wait to see if your symptoms will go away. Get medical help right away. Call your local emergency services (911 in the [place] Do not drive yourself to the hospital. Summary  Near-syncope is when you suddenly feel like you might pass out (faint), but you do not actually lose consciousness.  This condition is caused by a sudden decrease in blood flow to the brain. This decrease can result from various causes, but most of those causes are not dangerous.  Near-syncope may be a sign of a serious medical problem, so it is important to seek medical care. This information is not intended to replace advice given to you by your health care provider. Make sure you discuss any questions you have with your health care provider. Document Revised: 02/17/2019 Document Reviewed: 09/14/2018 Elsevier Patient Education  2020 [employer]  

## 2020-01-30 NOTE — Plan of Care (Signed)
  Problem: Education: Goal: Knowledge of General Education information will improve Description: Including pain rating scale, medication(s)/side effects and non-pharmacologic comfort measures Outcome: Progressing   Problem: Health Behavior/Discharge Planning: Goal: Ability to manage health-related needs will improve Outcome: Progressing   Problem: Clinical Measurements: Goal: Ability to maintain clinical measurements within normal limits will improve Outcome: Not Progressing Note: Sodium remains low at only 131. Will continue to monitor lab values for the remainder of the shift. Wenda Low [facility]

## 2020-01-30 NOTE — Consult Note (Signed)
Cardiology Consultation:   Patient ID: RENO STICKELS; [id number]; [birth year]   Admit date: 01/29/2020 Date of Consult: 01/30/2020  Primary Care Provider: Tonia Ghent, MD Primary Cardiologist: Fletcher Anon Primary Electrophysiologist:  Caryl Comes   Patient Profile:   Tim Mccullough is a 69 y.o. male with a hx of HFrEF secondary to NICM, frequent PACs and PVCs, HTN, and HLD who is being seen today for the evaluation of WCT at the request of Dr. Francine Graven.  History of Present Illness:   Mr. Salinger was diagnosed with nonischemic cardiomyopathy in 2010 with cardiac cath at that time showing minor luminal irregularities without obstructive CAD. EF was 35%. Subsequently, his EF improved to 50 to 55% in 03/2012. Echo in 11/2019 showed worsening of his LV systolic function with an EF of 30 to 35%. In this setting, he underwent R/LHC in 12/2019 which showed normal coronary arteries. RHC showed mildly elevated filling pressures and pulmonary hypertension with mildly reduced cardiac output. He was noted to have frequent PVCs thought to be possibly the culprit for his worsening LV systolic function. Given this, he was seen by Dr. Caryl Comes and started on flecainide. He was subsequently referred to Dr. Lovena Le to consider possible PVC ablation. He was started on Entresto though could not afford this and was subsequently changed back to losartan. He was most recently seen in the office on 01/25/2020 noting some exertional dyspnea though no chest pain or lower extremity swelling. He was tolerating all medications. He was changed metoprolol tartrate to metoprolol succinate 50 mg once daily and spironolactone was added with continuation of losartan. EKG at that visit showed sinus rhythm with known right bundle branch block with no PVCs.   While at home on 3/22, he was taking a shower and became dizzy and lightheaded.  There was no associated chest pain or palpitations.  Upon getting out of the shower he checked  his BP and found to be in the 120s over 80s with a heart rate in the 160s bpm.  His dizziness and lightheadedness slightly improved.  He proceeded to cardiac rehab where he was found to be hypotensive with a systolic blood pressure in the 80s mmHg and tachycardic with heart rates remaining in the 160s bpm. In this setting, he was transferred to the ED where he was noted to have improved BP ranging from 99/74 ranging to 113/94 and subsequent improvement and heart rates to the 1 teens bpm. EKGs as below.  Chest x-ray showed no active disease. High-sensitivity troponin of 11 with a delta of 10. Magnesium 1.3. Potassium 4.6, BUN 23, serum creatinine 1.53 with a baseline around 0.9-1.1. Case was discussed with Drs. Fletcher Anon and Caryl Comes in the ED with patient symptoms felt to likely be secondary to dehydration with recommendation to replete hypomagnesemia and hydrate with IV fluids followed by observation on telemetry.  EKG was not felt to represent VT. He has received IV magnesium with repeat value improved to 1.9.  Today, he feels well and is back to his baseline.  He has not had any further dizziness or lightheadedness.  No chest pain, dyspnea, palpitations, or syncope.  No lower extremity swelling.  No changes in his diet or medications.  He feels well.    Past Medical History:  Diagnosis Date  . Abnormal echocardiogram 2/11   Repeated after med mgmt of cardiomyopathy showed EF 40-45%, mildly dilated LV, mild LV hypertrophy, anterolateral and apical hypokinesis, mild diastolic dysfunction.  . Anemia    hx  .  Arthritis   . Blood transfusion without reported diagnosis 09/2012  . BPH (benign prostatic hypertrophy) 2004  . Cancer ([facility])    melanoma back  . Cardiomyopathy    ? tachycardia-mediated  . Depression   . Diabetes mellitus 11/2006   Type II  . Diverticulosis   . Echocardiogram abnormal 7/10   Mild LVH with a mildly dilated LV.  EF was difficult to assess given poor acoustic windows but  appeared moderately decreased.  Mild MR.  Probable small perimembranous VSD, mild LAE  . Esophageal erosions   . GERD (gastroesophageal reflux disease)   . Hip pain   . History of MRI 7/10   Cardiac MRI was done to followup difficult echo.  This showed mild to moderately dilated LV, mild to moderate LAE, EF  33% with global hypokinesis, normal RV size with mild systolic dysfunction.  There was no large VSD evident.  There was no myocardial delayed enhancement so no evidence for infiltrative disease or infarction.  LHC (7/10) showed only luminal irreg. in the coronaries and EF 35%  . Holter monitor, abnormal 7/10   Frequent PAC's and PVC's  Average HR 96  (but he had started Toprol XL).  HR range 71-130  . Hyperlipidemia 04/1998  . Hypertension 1995  . Obesity   . OSA (obstructive sleep apnea)    Not using CPAP- improved with weight loss  . Pneumonia    HX OF pna  . Sinus tachycardia    Pt says he has been told his heart rate is high since he was in high school.   . Sleep apnea    has cpap; but does not use  . VSD (ventricular septal defect)    Probable small perimembranous    Past Surgical History:  Procedure Laterality Date  . CARDIAC CATHETERIZATION  05/30/2009   Dilated cardiomyopathy.  min VSD.  Hypokin Inf  wall.  Multiple min luminal Irr.  EF 35%  . COLONOSCOPY W/ BIOPSIES  08/24/2006   Divertics, polyps x3, bx negative, repeat 2012 with mild diverticulosis in teh sigmoid colon and 1 polyp in tranverse colon, repeat due 2017  . ESOPHAGOGASTRODUODENOSCOPY  2012   1)Barrett's esoph, 2) Erosive esophagitis, 3) Mild gastritis in the antrum, 4) Duodenitis in the bulb of duodenum, 5) Small hiatal hernia  . INGUINAL HERNIA REPAIR  1985   Left  . INGUINAL HERNIA REPAIR  1999   Right  . KNEE ARTHROSCOPY  10/15/2011   Procedure: ARTHROSCOPY KNEE;  Surgeon: Ninetta Lights, MD;  Location: [facility];  Service: Orthopedics;  Laterality: Right;  right knee scope with lateral  meniscectomy, removal loose foreign body, and microfracture technique  . LAMINECTOMY  1987   with discectomy  . RIGHT/LEFT HEART CATH AND CORONARY ANGIOGRAPHY N/A 12/25/2019   Procedure: RIGHT/LEFT HEART CATH AND CORONARY ANGIOGRAPHY;  Surgeon: Wellington Hampshire, MD;  Location: [facility];  Service: Cardiovascular;  Laterality: N/A;  . ROTATOR CUFF REPAIR  2/11   rt  . TOTAL HIP ARTHROPLASTY  2011   Left  . TOTAL KNEE ARTHROPLASTY  06/01/2012   Procedure: TOTAL KNEE ARTHROPLASTY;  Surgeon: Ninetta Lights, MD;  Location: [facility];  Service: Orthopedics;  Laterality: Right;     Home Meds: Prior to Admission medications   Medication Sig Start Date End Date Taking? Authorizing Provider  aspirin 81 MG tablet Take 81 mg by mouth daily.    Yes [provider]  atorvastatin (LIPITOR) 40 MG tablet TAKE 1  TABLET BY MOUTH EVERY DAY Patient taking differently: Take 40 mg by mouth daily.  08/28/19  Yes Tonia Ghent, MD  doxazosin (CARDURA) 1 MG tablet 1 tablet daily 04/28/19  Yes Tonia Ghent, MD  fish oil-omega-3 fatty acids 1000 MG capsule Take 1 g by mouth 2 (two) times daily.    Yes [provider]  flecainide (TAMBOCOR) 100 MG tablet Take one tablet (100 mg) by mouth twice daily as directed. 01/11/20  Yes Deboraha Sprang, MD  glimepiride (AMARYL) 1 MG tablet 1 tab in the AM, 1 in the PM Patient taking differently: Take 1 mg by mouth daily with breakfast.  11/28/19  Yes Tonia Ghent, MD  losartan (COZAAR) 50 MG tablet Take 1 tablet (50 mg total) by mouth daily. 01/11/20  Yes Deboraha Sprang, MD  meloxicam (MOBIC) 15 MG tablet TAKE 1 TABLET BY MOUTH EVERY DAY Patient taking differently: Take 15 mg by mouth daily.  07/24/19  Yes Tonia Ghent, MD  metFORMIN (GLUCOPHAGE) 500 MG tablet TAKE 2 TABLETS (1,000 MG TOTAL) BY MOUTH 2 (TWO) TIMES DAILY WITH A MEAL. 01/02/20  Yes Tonia Ghent, MD  metoprolol succinate (TOPROL-XL) 50 MG 24 hr tablet Take 1 tablet (50 mg  total) by mouth daily. Take with or immediately following a meal. 01/25/20 04/24/20 Yes Wellington Hampshire, MD  omeprazole (PRILOSEC) 20 MG capsule Take 1 capsule (20 mg total) by mouth daily. 09/28/19  Yes Ladene Artist, MD  PARoxetine (PAXIL) 20 MG tablet Take 1 tablet (20 mg total) by mouth daily. 04/28/19  Yes Tonia Ghent, MD  spironolactone (ALDACTONE) 25 MG tablet Take 1 tablet (25 mg total) by mouth daily. 01/25/20 04/24/20 Yes Wellington Hampshire, MD  Lancets [facility] ULTRASOFT) lancets USE DAILY AS NEEDED TO CHECK SUGAR, DX 250.00 07/22/15   Tonia Ghent, MD  ONE TOUCH ULTRA TEST test strip CHECK BLOOD SUGAR DAILY AND IF BLOOD SUGAR FLUCTUATES. 250.00 01/02/15   Tonia Ghent, MD  oxymetazoline (AFRIN) 0.05 % nasal spray Place 1 spray into both nostrils 2 (two) times daily as needed for congestion.    [provider]  triamcinolone ointment (KENALOG) 0.1 % Apply 1 application topically 2 (two) times daily as needed.  01/25/20   [provider]    Inpatient Medications: Scheduled Meds: . aspirin EC  81 mg Oral Daily  . atorvastatin  40 mg Oral Daily  . enoxaparin (LOVENOX) injection  40 mg Subcutaneous Q24H  . flecainide  100 mg Oral BID  . insulin aspart  0-15 Units Subcutaneous TID WC  . insulin aspart  4 Units Subcutaneous TID WC  . losartan  50 mg Oral Daily  . metoprolol succinate  50 mg Oral Daily  . omega-3 acid ethyl esters  1 g Oral BID  . pantoprazole  40 mg Oral Daily  . PARoxetine  20 mg Oral Daily  . sodium chloride flush  3 mL Intravenous Q12H  . spironolactone  25 mg Oral Daily   Continuous Infusions: . sodium chloride     PRN Meds: sodium chloride, ondansetron **OR** ondansetron (ZOFRAN) IV, oxymetazoline, sodium chloride flush  Allergies:  No Known Allergies  Social History:   Social History   Socioeconomic History  . Marital status: Single    Spouse name: Not on file  . Number of children: 3  . Years of education: Not on file   . Highest education level: Not on file  Occupational History  . Occupation:  ADJUSTER    Employer: [employer]  . Occupation: [employer]  Tobacco Use  . Smoking status: Former Smoker    Years: 20.00    Types: Cigarettes    Quit date: 11/09/1998    Years since quitting: 21.2  . Smokeless tobacco: Former Systems developer    Quit date: 06/03/2007  . Tobacco comment: no alcohol since nov 12  Substance and Sexual Activity  . Alcohol use: No    Alcohol/week: 0.0 standard drinks    Comment: sober as 09/2011  . Drug use: No  . Sexual activity: Not Currently  Other Topics Concern  . Not on file  Social History Narrative   Lives in [place], daughter and her 2 kids live with patient, 3 kids (2 sons, 1 daughter)   Divorced   Social Determinants of Radio broadcast assistant Strain:   . Difficulty of Paying Living Expenses:   Food Insecurity:   . Worried About Charity fundraiser in the Last Year:   . Arboriculturist in the Last Year:   Transportation Needs:   . Film/video editor (Medical):   Marland Kitchen Lack of Transportation (Non-Medical):   Physical Activity:   . Days of Exercise per Week:   . Minutes of Exercise per Session:   Stress:   . Feeling of Stress :   Social Connections:   . Frequency of Communication with Friends and Family:   . Frequency of Social Gatherings with Friends and Family:   . Attends Religious Services:   . Active Member of Clubs or Organizations:   . Attends Archivist Meetings:   Marland Kitchen Marital Status:   Intimate Partner Violence:   . Fear of Current or Ex-Partner:   . Emotionally Abused:   Marland Kitchen Physically Abused:   . Sexually Abused:      Family History:   Family History  Problem Relation Age of Onset  . Heart disease Father        CABG  . Stroke Father   . Diabetes Father   . Hypertension Mother   . Alzheimer's disease Mother   . Diabetes Sister   . Hypertension Sister   . Cancer Sister        uterine cancer  . Diabetes  Brother   . Throat cancer Brother        Throat  . Esophageal cancer Brother   . Diabetes Sister   . Hypertension Sister   . Throat cancer Other        Throat  . Diabetes Other   . Alcohol abuse Other   . Diabetes Maternal Grandmother        Insulin  . Colon cancer Cousin   . Esophageal cancer Maternal Grandfather   . Drug abuse Neg Hx   . Prostate cancer Neg Hx   . Rectal cancer Neg Hx     ROS:  Review of Systems  Constitutional: Positive for malaise/fatigue. Negative for chills, diaphoresis, fever and weight loss.  HENT: Negative for congestion.   Eyes: Negative for discharge and redness.  Respiratory: Negative for cough, sputum production, shortness of breath and wheezing.   Cardiovascular: Negative for chest pain, palpitations, orthopnea, claudication, leg swelling and PND.  Gastrointestinal: Negative for abdominal pain, heartburn, nausea and vomiting.  Musculoskeletal: Negative for falls and myalgias.  Skin: Negative for rash.  Neurological: Positive for dizziness and weakness. Negative for tingling, tremors, sensory change, speech change, focal weakness and loss of consciousness.  Endo/Heme/Allergies: Does not  bruise/bleed easily.  Psychiatric/Behavioral: Negative for substance abuse. The patient is not nervous/anxious.   All other systems reviewed and are negative.     Physical Exam/Data:   Vitals:   01/30/20 0845 01/30/20 0900 01/30/20 0930 01/30/20 0945  BP:  136/80 129/85   Pulse: 81 88 88 (!) 109  Resp: 14 15 13    Temp:      TempSrc:      SpO2: 93% 93%  95%  Weight:      Height:       No intake or output data in the 24 hours ending 01/30/20 1015 Filed Weights   01/29/20 1345  Weight: 108 kg   Body mass index is 34.15 kg/m.   Physical Exam: General: Well developed, well nourished, in no acute distress. Head: Normocephalic, atraumatic, sclera non-icteric, no xanthomas, nares without discharge.  Neck: Negative for carotid bruits. JVD not  elevated. Lungs: Clear bilaterally to auscultation without wheezes, rales, or rhonchi. Breathing is unlabored. Heart: Mildly tachycardic with S1 S2. No murmurs, rubs, or gallops appreciated. Abdomen: Soft, non-tender, non-distended with normoactive bowel sounds. No hepatomegaly. No rebound/guarding. No obvious abdominal masses. Msk:  Strength and tone appear normal for age. Extremities: No clubbing or cyanosis. No edema. Distal pedal pulses are 2+ and equal bilaterally. Neuro: Alert and oriented X 3. No facial asymmetry. No focal deficit. Moves all extremities spontaneously. Psych:  Responds to questions appropriately with a normal affect.   EKG:  The EKG was personally reviewed and demonstrates: 3/22, 13:29 - WCT, possibly atrial tach versus sinus tach, 144 bpm, RBBB.  3/22, 13:49 - WCT consistent with sinus tach, 116 bpm, RBBB.  3/22, 14:10 -WCT, consistent with sinus tach, 112 bpm, RBBB. Telemetry:  Telemetry was personally reviewed and demonstrates: Unable to review ED telemetry as this was deleted from the system following the patient's transition to telemetry floor.  Since arriving on telemetry, patient appears to be in sinus tachycardia with rates in the low 100s bpm with underlying bundle branch block.  Weights: Filed Weights   01/29/20 1345  Weight: 108 kg    Relevant CV Studies: R/LHC 12/25/2019: 1.  Normal coronary arteries. 2.  Right heart catheterization showed mildly elevated filling pressures (PCW of 14 mmHg), mild pulmonary hypertension (37/21 mmHg) and mildly reduced cardiac output at 4.69 with a cardiac index of 2.09.  Recommendations: Continue medical therapy for nonischemic cardiomyopathy. Consider EP evaluation for treatment of PVCs which might be contributing to worsening cardiomyopathy. __________  2D Echo 11/22/2019: 1. Left ventricular ejection fraction, by visual estimation, is 30 to  35%. The left ventricle has moderate to severely decreased function. There   is no left ventricular hypertrophy.  2. Left ventricular diastolic function could not be evaluated.  3. Mildly dilated left ventricular internal cavity size.  4. The left ventricle demonstrates global hypokinesis.  5. Global right ventricle has mildly reduced systolic function.The right  ventricular size is normal. Right vetricular wall thickness was not  assessed.  6. Left atrial size was mildly dilated.  7. Right atrial size was normal.  8. The mitral valve is normal in structure. No evidence of mitral valve  regurgitation.  9. The tricuspid valve is grossly normal.  10. The aortic valve is grossly normal. Aortic valve regurgitation is not  visualized.  11. Pulmonic regurgitation not assessed.  12. The pulmonic valve was not well visualized. Pulmonic valve  regurgitation not assessed.    Laboratory Data:  Chemistry Recent Labs  Lab 01/29/20 1339  NA 131*  K 4.6  CL 95*  CO2 22  GLUCOSE 276*  BUN 23  CREATININE 1.53*  CALCIUM 8.9  GFRNONAA 46*  GFRAA 53*  ANIONGAP 14    No results for input(s): PROT, ALBUMIN, AST, ALT, ALKPHOS, BILITOT in the last 168 hours. Hematology Recent Labs  Lab 01/29/20 1339  WBC 9.0  RBC 4.59  HGB 14.4  HCT 42.2  MCV 91.9  MCH 31.4  MCHC 34.1  RDW 12.2  PLT 283   Cardiac EnzymesNo results for input(s): TROPONINI in the last 168 hours. No results for input(s): TROPIPOC in the last 168 hours.  BNPNo results for input(s): BNP, PROBNP in the last 168 hours.  DDimer No results for input(s): DDIMER in the last 168 hours.  Radiology/Studies:  DG Chest Portable 1 View  Result Date: 01/29/2020 IMPRESSION: No active disease. Electronically Signed   By: Inez Catalina M.D.   On: 01/29/2020 14:29    Assessment and Plan:   1. WCT: -Case reviewed remotely with EP with WCT felt to clearly be supraventricular and not represent VT -Hypomagnesemia improved -Potassium at goal -TSH normal -Discontinue flecainide  -Titrate Toprol XL  to 75 mg daily -Recommend he follow up with EP as an outpatient this week for further recommendations/management.  Appointment has been made for 10/25 at 10:20 AM with his electrophysiologist  2.  Dizziness: -Possibly in the setting of dehydration given elevated renal function at time of presentation -Decrease losartan to 25 mg daily -Hydrate -Follow up with EP  3. HFrEF secondary to NICM: -His most recent EF was lower than prior, felt to be in the setting of PVC burden, which subsequently improved following the addition of flecainide leading to the postponement of PVC ablation -Unable to afford Entresto -Losartan, Toprol, and spironolactone  -Euvolemic   4. HTN: -BP initially soft and has improved with IV hydration -Changes as outlined above  5. HLD: -Remains on Lipitor -Most recent LDL of 75  6. Frequent PVCs: -Followed by EP -Flecainide has been held as outlined above with recommendation for him to follow-up with EP for further management -Toprol titrated as outlined above   For questions or updates, please contact [facility] Please consult [email or web address] for contact info under Cardiology/STEMI.   Signed, Christell Faith, PA-C [facility] Pager: [phone] 01/30/2020, 10:15 AM

## 2020-01-31 ENCOUNTER — Encounter: Payer: Self-pay | Admitting: *Deleted

## 2020-01-31 DIAGNOSIS — I5022 Chronic systolic (congestive) heart failure: Secondary | ICD-10-CM

## 2020-01-31 NOTE — Progress Notes (Signed)
Cardiac Individual Treatment Plan  Patient Details  Name: Tim Mccullough MRN: [id number] Date of Birth: 09-Jul-1951 Referring Provider:     Cardiac Rehab from 01/08/2020 in [facility] Cardiac and Pulmonary Rehab  Referring Provider  [name] MD      Initial Encounter Date:    Cardiac Rehab from 01/08/2020 in [facility] Cardiac and Pulmonary Rehab  Date  01/08/20      Visit Diagnosis: Heart failure, chronic systolic ([place])  Patient's Home Medications on Admission:  Current Outpatient Medications:  .  aspirin 81 MG tablet, Take 81 mg by mouth daily. , Disp: , Rfl:  .  atorvastatin (LIPITOR) 40 MG tablet, TAKE 1 TABLET BY MOUTH EVERY DAY (Patient taking differently: Take 40 mg by mouth daily. ), Disp: 90 tablet, Rfl: 3 .  doxazosin (CARDURA) 1 MG tablet, 1 tablet daily, Disp: 90 tablet, Rfl: 3 .  fish oil-omega-3 fatty acids 1000 MG capsule, Take 1 g by mouth 2 (two) times daily. , Disp: , Rfl:  .  losartan (COZAAR) 25 MG tablet, Take 1 tablet (25 mg total) by mouth daily., Disp: 30 tablet, Rfl: 0 .  magnesium oxide (MAG-OX) 400 (241.3 Mg) MG tablet, Take 1 tablet (400 mg total) by mouth daily., Disp: 30 tablet, Rfl: 0 .  metFORMIN (GLUCOPHAGE) 500 MG tablet, TAKE 2 TABLETS (1,000 MG TOTAL) BY MOUTH 2 (TWO) TIMES DAILY WITH A MEAL., Disp: 360 tablet, Rfl: 3 .  metoprolol succinate (TOPROL-XL) 100 MG 24 hr tablet, Take 1 tablet (100 mg total) by mouth daily. Take with or immediately following a meal., Disp: 30 tablet, Rfl: 0 .  omeprazole (PRILOSEC) 20 MG capsule, Take 1 capsule (20 mg total) by mouth daily., Disp: 30 capsule, Rfl: 11 .  ONE TOUCH ULTRA TEST test strip, CHECK BLOOD SUGAR DAILY AND IF BLOOD SUGAR FLUCTUATES. 250.00, Disp: 100 each, Rfl: 11 .  PARoxetine (PAXIL) 20 MG tablet, Take 1 tablet (20 mg total) by mouth daily., Disp: 90 tablet, Rfl: 3 .  spironolactone (ALDACTONE) 25 MG tablet, Take 1 tablet (25 mg total) by mouth daily., Disp: 90 tablet, Rfl: 3 .  triamcinolone ointment  (KENALOG) 0.1 %, Apply 1 application topically 2 (two) times daily as needed. , Disp: , Rfl:   Past Medical History: Past Medical History:  Diagnosis Date  . Abnormal echocardiogram 2/11   Repeated after med mgmt of cardiomyopathy showed EF 40-45%, mildly dilated LV, mild LV hypertrophy, anterolateral and apical hypokinesis, mild diastolic dysfunction.  . Anemia    hx  . Arthritis   . Blood transfusion without reported diagnosis 09/2012  . BPH (benign prostatic hypertrophy) 2004  . Cancer ([place])    melanoma back  . Cardiomyopathy    ? tachycardia-mediated  . Depression   . Diabetes mellitus 11/2006   Type II  . Diverticulosis   . Echocardiogram abnormal 7/10   Mild LVH with a mildly dilated LV.  EF was difficult to assess given poor acoustic windows but appeared moderately decreased.  Mild MR.  Probable small perimembranous VSD, mild LAE  . Esophageal erosions   . GERD (gastroesophageal reflux disease)   . Hip pain   . History of MRI 7/10   Cardiac MRI was done to followup difficult echo.  This showed mild to moderately dilated LV, mild to moderate LAE, EF  33% with global hypokinesis, normal RV size with mild systolic dysfunction.  There was no large VSD evident.  There was no myocardial delayed enhancement so no evidence for infiltrative disease  or infarction.  LHC (7/10) showed only luminal irreg. in the coronaries and EF 35%  . Holter monitor, abnormal 7/10   Frequent PAC's and PVC's  Average HR 96  (but he had started Toprol XL).  HR range 71-130  . Hyperlipidemia 04/1998  . Hypertension 1995  . Obesity   . OSA (obstructive sleep apnea)    Not using CPAP- improved with weight loss  . Pneumonia    HX OF pna  . Sinus tachycardia    Pt says he has been told his heart rate is high since he was in high school.   . Sleep apnea    has cpap; but does not use  . VSD (ventricular septal defect)    Probable small perimembranous    Tobacco Use: Social History   Tobacco Use   Smoking Status Former Smoker  . Years: 20.00  . Types: Cigarettes  . Quit date: 11/09/1998  . Years since quitting: 21.2  Smokeless Tobacco Former Systems developer  . Quit date: 06/03/2007  Tobacco Comment   no alcohol since nov 12    Labs: Recent Review Flowsheet Data    Labs for ITP Cardiac and Pulmonary Rehab Latest Ref Rng & Units 01/20/2019 04/26/2019 08/21/2019 11/28/2019 01/29/2020   Cholestrol 0 - 200 mg/dL - 246(H) 142 - -   LDLCALC 0 - 99 mg/dL - 169(H) 76 - -   LDLDIRECT mg/dL - - - - -   HDL >39.00 mg/dL - 43.40 38.70(L) - -   Trlycerides 0.0 - 149.0 mg/dL - 172.0(H) 137.0 - -   Hemoglobin A1c 4.8 - 5.6 % 6.9(A) 7.0(H) 7.0(H) 6.8(H) 6.7(H)   PHART 7.350 - 7.450 - - - - -   PCO2ART 35.0 - 45.0 mmHg - - - - -   HCO3 20.0 - 24.0 mEq/L - - - - -   TCO2 0 - 100 mmol/L - - - - -   O2SAT % - - - - -       Exercise Target Goals: Exercise Program Goal: Individual exercise prescription set using results from initial 6 min walk test and THRR while considering  patient's activity barriers and safety.   Exercise Prescription Goal: Initial exercise prescription builds to 30-45 minutes a day of aerobic activity, 2-3 days per week.  Home exercise guidelines will be given to patient during program as part of exercise prescription that the participant will acknowledge.  Activity Barriers & Risk Stratification: Activity Barriers & Cardiac Risk Stratification - 01/08/20 1102      Activity Barriers & Cardiac Risk Stratification   Activity Barriers  Joint Problems;Back Problems;Left Hip Replacement;Right Knee Replacement;Deconditioning;Muscular Weakness    Cardiac Risk Stratification  High       6 Minute Walk: 6 Minute Walk    Row Name 01/08/20 1101         6 Minute Walk   Phase  Initial     Distance  1163 feet     Walk Time  6 minutes     # of Rest Breaks  0     MPH  2.2     METS  2.71     RPE  11     VO2 Peak  9.49     Symptoms  Yes (comment)     Comments  Left hip starting to be  bothersome at end 6/10     Resting HR  91 bpm     Resting BP  126/70     Resting Oxygen Saturation  96 %     Exercise Oxygen Saturation  during 6 min walk  93 %     Max Ex. HR  123 bpm     Max Ex. BP  142/70     2 Minute Post BP  130/60        Oxygen Initial Assessment:   Oxygen Re-Evaluation:   Oxygen Discharge (Final Oxygen Re-Evaluation):   Initial Exercise Prescription: Initial Exercise Prescription - 01/08/20 1100      Date of Initial Exercise RX and Referring Provider   Date  01/08/20    Referring Provider  [name] MD      Treadmill   MPH  2.2    Grade  0    Minutes  15    METs  2.68      Recumbant Elliptical   Level  1    RPM  50    Minutes  15    METs  2.7      Elliptical   Level  1    Speed  2.5    Minutes  15      REL-XR   Level  2    Speed  50    Minutes  15    METs  2.7      Prescription Details   Frequency (times per week)  3    Duration  Progress to 30 minutes of continuous aerobic without signs/symptoms of physical distress      Intensity   THRR 40-80% of Max Heartrate  115-139    Ratings of Perceived Exertion  11-13    Perceived Dyspnea  0-4      Progression   Progression  Continue to progress workloads to maintain intensity without signs/symptoms of physical distress.      Resistance Training   Training Prescription  Yes    Weight  4 lb       Perform Capillary Blood Glucose checks as needed.  Exercise Prescription Changes: Exercise Prescription Changes    Row Name 01/08/20 1100 01/24/20 1300           Response to Exercise   Blood Pressure (Admit)  126/70  118/68      Blood Pressure (Exercise)  142/74  128/74      Blood Pressure (Exit)  130/60  124/70      Heart Rate (Admit)  97 bpm  94 bpm      Heart Rate (Exercise)  123 bpm  105 bpm      Heart Rate (Exit)  94 bpm  97 bpm      Oxygen Saturation (Admit)  96 %  --      Oxygen Saturation (Exercise)  93 %  --      Rating of Perceived Exertion (Exercise)  11   13      Perceived Dyspnea (Exercise)  0  --      Symptoms  hip bothersome at end 6/10  none      Comments  walk test results  --      Duration  --  Continue with 30 min of aerobic exercise without signs/symptoms of physical distress.      Intensity  --  THRR unchanged        Progression   Progression  --  Continue to progress workloads to maintain intensity without signs/symptoms of physical distress.      Average METs  --  2.77        Resistance Training   Training Prescription  --  Yes      Weight  --  4 lb      Reps  --  10-15        Interval Training   Interval Training  --  No        Treadmill   MPH  --  2.2      Grade  --  0      Minutes  --  15      METs  --  2.68        Recumbant Bike   Level  --  4      Minutes  --  15      METs  --  3.7        NuStep   Level  --  6      Minutes  --  15      METs  --  2.5        Recumbant Elliptical   Level  --  2      Minutes  --  15      METs  --  2.2        Home Exercise Plan   Plans to continue exercise at  --  Home (comment) walking      Frequency  --  Add 2 additional days to program exercise sessions.      Initial Home Exercises Provided  --  01/12/20         Exercise Comments: Exercise Comments    Row Name 01/10/20 1323           Exercise Comments  First full day of exercise!  Patient was oriented to gym and equipment including functions, settings, policies, and procedures.  Patient's individual exercise prescription and treatment plan were reviewed.  All starting workloads were established based on the results of the 6 minute walk test done at initial orientation visit.  The plan for exercise progression was also introduced and progression will be customized based on patient's performance and goals.          Exercise Goals and Review: Exercise Goals    Row Name 01/08/20 1255             Exercise Goals   Increase Physical Activity  Yes       Intervention  Provide advice, education, support and  counseling about physical activity/exercise needs.;Develop an individualized exercise prescription for aerobic and resistive training based on initial evaluation findings, risk stratification, comorbidities and participant's personal goals.       Expected Outcomes  Short Term: Attend rehab on a regular basis to increase amount of physical activity.;Long Term: Add in home exercise to make exercise part of routine and to increase amount of physical activity.;Long Term: Exercising regularly at least 3-5 days a week.       Increase Strength and Stamina  Yes       Intervention  Provide advice, education, support and counseling about physical activity/exercise needs.;Develop an individualized exercise prescription for aerobic and resistive training based on initial evaluation findings, risk stratification, comorbidities and participant's personal goals.       Expected Outcomes  Short Term: Increase workloads from initial exercise prescription for resistance, speed, and METs.;Short Term: Perform resistance training exercises routinely during rehab and add in resistance training at home;Long Term: Improve cardiorespiratory fitness, muscular endurance and strength as measured by increased METs and functional capacity (6MWT)       Able to understand and use rate of perceived exertion (RPE)  scale  Yes       Intervention  Provide education and explanation on how to use RPE scale       Expected Outcomes  Short Term: Able to use RPE daily in rehab to express subjective intensity level;Long Term:  Able to use RPE to guide intensity level when exercising independently       Able to understand and use Dyspnea scale  Yes       Intervention  Provide education and explanation on how to use Dyspnea scale       Expected Outcomes  Short Term: Able to use Dyspnea scale daily in rehab to express subjective sense of shortness of breath during exertion;Long Term: Able to use Dyspnea scale to guide intensity level when exercising  independently       Knowledge and understanding of Target Heart Rate Range (THRR)  Yes       Intervention  Provide education and explanation of THRR including how the numbers were predicted and where they are located for reference       Expected Outcomes  Short Term: Able to use daily as guideline for intensity in rehab;Short Term: Able to state/look up THRR;Long Term: Able to use THRR to govern intensity when exercising independently       Able to check pulse independently  Yes       Intervention  Provide education and demonstration on how to check pulse in carotid and radial arteries.;Review the importance of being able to check your own pulse for safety during independent exercise       Expected Outcomes  Short Term: Able to explain why pulse checking is important during independent exercise;Long Term: Able to check pulse independently and accurately       Understanding of Exercise Prescription  Yes       Intervention  Provide education, explanation, and written materials on patient's individual exercise prescription       Expected Outcomes  Short Term: Able to explain program exercise prescription;Long Term: Able to explain home exercise prescription to exercise independently          Exercise Goals Re-Evaluation : Exercise Goals Re-Evaluation    Row Name 01/10/20 1323 01/18/20 1337 01/24/20 1326         Exercise Goal Re-Evaluation   Exercise Goals Review  Able to understand and use rate of perceived exertion (RPE) scale;Knowledge and understanding of Target Heart Rate Range (THRR);Understanding of Exercise Prescription  Increase Physical Activity;Increase Strength and Stamina;Understanding of Exercise Prescription  Increase Physical Activity;Increase Strength and Stamina;Understanding of Exercise Prescription     Comments  Reviewed RPE scale, THR and program prescription with pt today.  Pt voiced understanding and was given a copy of goals to take home.  [name] is off to a good start in  rehab.  He is walking in the drive way now and plans to start gardening some more.  Reviewed home exercise with pt today.  Pt plans to walk at home for exercise.  Reviewed THR, pulse, RPE, sign and symptoms, NTG use, and when to call 911 or MD.  Also discussed weather considerations and indoor options.  Pt voiced understanding.  [name] is doing well in rehab.  He is up to level 6 on the NuStep. We will continue to monitor his progress.     Expected Outcomes  Short: Use RPE daily to regulate intensity. Long: Follow program prescription in THR.  Short: Start to add in more walking  Long: Continue to build stamina.  Short: Add incline on treadmill  Long: Continue to add in walking at home.        Discharge Exercise Prescription (Final Exercise Prescription Changes): Exercise Prescription Changes - 01/24/20 1300      Response to Exercise   Blood Pressure (Admit)  118/68    Blood Pressure (Exercise)  128/74    Blood Pressure (Exit)  124/70    Heart Rate (Admit)  94 bpm    Heart Rate (Exercise)  105 bpm    Heart Rate (Exit)  97 bpm    Rating of Perceived Exertion (Exercise)  13    Symptoms  none    Duration  Continue with 30 min of aerobic exercise without signs/symptoms of physical distress.    Intensity  THRR unchanged      Progression   Progression  Continue to progress workloads to maintain intensity without signs/symptoms of physical distress.    Average METs  2.77      Resistance Training   Training Prescription  Yes    Weight  4 lb    Reps  10-15      Interval Training   Interval Training  No      Treadmill   MPH  2.2    Grade  0    Minutes  15    METs  2.68      Recumbant Bike   Level  4    Minutes  15    METs  3.7      NuStep   Level  6    Minutes  15    METs  2.5      Recumbant Elliptical   Level  2    Minutes  15    METs  2.2      Home Exercise Plan   Plans to continue exercise at  Home (comment)   walking   Frequency  Add 2 additional days to program  exercise sessions.    Initial Home Exercises Provided  01/12/20       Nutrition:  Target Goals: Understanding of nutrition guidelines, daily intake of sodium <1546m, cholesterol <2050m calories 30% from fat and 7% or less from saturated fats, daily to have 5 or more servings of fruits and vegetables.  Biometrics: Pre Biometrics - 01/08/20 1257      Pre Biometrics   Height  5' 10.4" (1.788 m)    Weight  241 lb 11.2 oz (109.6 kg)    BMI (Calculated)  34.29    Single Leg Stand  26.06 seconds        Nutrition Therapy Plan and Nutrition Goals:   Nutrition Assessments: Nutrition Assessments - 01/08/20 1259      MEDFICTS Scores   Pre Score  72       Nutrition Goals Re-Evaluation:   Nutrition Goals Discharge (Final Nutrition Goals Re-Evaluation):   Psychosocial: Target Goals: Acknowledge presence or absence of significant depression and/or stress, maximize coping skills, provide positive support system. Participant is able to verbalize types and ability to use techniques and skills needed for reducing stress and depression.   Initial Review & Psychosocial Screening: Initial Psych Review & Screening - 01/04/20 1040      Initial Review   Current issues with  Current Stress Concerns    Source of Stress Concerns  Chronic Illness      Family Dynamics   Good Support System?  Yes   daughter, grandaughters     Barriers   Psychosocial barriers to participate in program  There are no identifiable barriers or psychosocial needs.;The patient should benefit from training in stress management and relaxation.      Screening Interventions   Interventions  Provide feedback about the scores to participant;Encouraged to exercise;To provide support and resources with identified psychosocial needs    Expected Outcomes  Short Term goal: Utilizing psychosocial counselor, staff and physician to assist with identification of specific Stressors or current issues interfering with healing  process. Setting desired goal for each stressor or current issue identified.;Long Term Goal: Stressors or current issues are controlled or eliminated.;Short Term goal: Identification and review with participant of any Quality of Life or Depression concerns found by scoring the questionnaire.;Long Term goal: The participant improves quality of Life and PHQ9 Scores as seen by post scores and/or verbalization of changes       Quality of Life Scores:  Quality of Life - 01/08/20 1258      Quality of Life   Select  Quality of Life      Quality of Life Scores   Health/Function Pre  17.03 %    Socioeconomic Pre  20.79 %    Psych/Spiritual Pre  20.71 %    Family Pre  27 %    GLOBAL Pre  19.82 %      Scores of 19 and below usually indicate a poorer quality of life in these areas.  A difference of  2-3 points is a clinically meaningful difference.  A difference of 2-3 points in the total score of the Quality of Life Index has been associated with significant improvement in overall quality of life, self-image, physical symptoms, and general health in studies assessing change in quality of life.  PHQ-9: Recent Review Flowsheet Data    Depression screen [facility] 2/9 01/08/2020 04/26/2019 04/08/2018 08/20/2015   Decreased Interest 1 0 0 0   Down, Depressed, Hopeless 0 0 0 0   PHQ - 2 Score 1 0 0 0   Altered sleeping 1 0 0 -   Tired, decreased energy 2 0 0 -   Change in appetite 1 0 0 -   Feeling bad or failure about yourself  0 0 0 -   Trouble concentrating 0 0 0 -   Moving slowly or fidgety/restless 0 0 0 -   Suicidal thoughts 0 0 0 -   PHQ-9 Score 5 0 0 -   Difficult doing work/chores Somewhat difficult Not difficult at all Not difficult at all -     Interpretation of Total Score  Total Score Depression Severity:  1-4 = Minimal depression, 5-9 = Mild depression, 10-14 = Moderate depression, 15-19 = Moderately severe depression, 20-27 = Severe depression   Psychosocial Evaluation and  Intervention: Psychosocial Evaluation - 01/04/20 1044      Psychosocial Evaluation & Interventions   Comments  Prince reports doing well. His daughter and her two daughters live with him. The issue causing him the most stress right now is the cost of Entresto. He knows this medicine will help him a lot, it is just hard coming up with the additional $400-$500 a month. He is working with his doctor's office and pharmacy to find a plan that is more affordable. This issue currently has caused some sleep issues, but he knows it will all work out in the end. He is looking forward to coming to cardiac rehab because he really wants to work hard to feel better and have the best quality of life.    Expected Outcomes  Short: attend  [facility] for exercise and education. Long: develope positive self care habits    Continue Psychosocial Services   Follow up required by staff       Psychosocial Re-Evaluation: Psychosocial Re-Evaluation    [place] Name 01/18/20 1338             Psychosocial Re-Evaluation   Current issues with  Current Stress Concerns       Comments  [name] is doing well mentally and in a good place.  He has really enjoyed coming to class and feeling better overall.  He forgot to call court about his jury duty miss.  He just has a lot on his mind.       Expected Outcomes  Short: Continue to attend rehab.  Long: Continue to stay positive.       Interventions  Encouraged to attend Cardiac Rehabilitation for the exercise       Continue Psychosocial Services   Follow up required by staff          Psychosocial Discharge (Final Psychosocial Re-Evaluation): Psychosocial Re-Evaluation - 01/18/20 1338      Psychosocial Re-Evaluation   Current issues with  Current Stress Concerns    Comments  [name] is doing well mentally and in a good place.  He has really enjoyed coming to class and feeling better overall.  He forgot to call court about his jury duty miss.  He just has a lot on his mind.     Expected Outcomes  Short: Continue to attend rehab.  Long: Continue to stay positive.    Interventions  Encouraged to attend Cardiac Rehabilitation for the exercise    Continue Psychosocial Services   Follow up required by staff       Vocational Rehabilitation: Provide vocational rehab assistance to qualifying candidates.   Vocational Rehab Evaluation & Intervention: Vocational Rehab - 01/04/20 1037      Initial Vocational Rehab Evaluation & Intervention   Assessment shows need for Vocational Rehabilitation  No       Education: Education Goals: Education classes will be provided on a variety of topics geared toward better understanding of heart health and risk factor modification. Participant will state understanding/return demonstration of topics presented as noted by education test scores.  Learning Barriers/Preferences: Learning Barriers/Preferences - 01/04/20 1036      Learning Barriers/Preferences   Learning Barriers  None    Learning Preferences  None       Education Topics:  AED/CPR: - Group verbal and written instruction with the use of models to demonstrate the basic use of the AED with the basic ABC's of resuscitation.   General Nutrition Guidelines/Fats and Fiber: -Group instruction provided by verbal, written material, models and posters to present the general guidelines for heart healthy nutrition. Gives an explanation and review of dietary fats and fiber.   Controlling Sodium/Reading Food Labels: -Group verbal and written material supporting the discussion of sodium use in heart healthy nutrition. Review and explanation with models, verbal and written materials for utilization of the food label.   Exercise Physiology & General Exercise Guidelines: - Group verbal and written instruction with models to review the exercise physiology of the cardiovascular system and associated critical values. Provides general exercise guidelines with specific guidelines to  those with heart or lung disease.    Aerobic Exercise & Resistance Training: - Gives group verbal and written instruction on the various components of exercise. Focuses on aerobic and resistive training programs and the benefits of this training and how to  safely progress through these programs..   Flexibility, Balance, Mind/Body Relaxation: Provides group verbal/written instruction on the benefits of flexibility and balance training, including mind/body exercise modes such as yoga, pilates and tai chi.  Demonstration and skill practice provided.   Stress and Anxiety: - Provides group verbal and written instruction about the health risks of elevated stress and causes of high stress.  Discuss the correlation between heart/lung disease and anxiety and treatment options. Review healthy ways to manage with stress and anxiety.   Depression: - Provides group verbal and written instruction on the correlation between heart/lung disease and depressed mood, treatment options, and the stigmas associated with seeking treatment.   Anatomy & Physiology of the Heart: - Group verbal and written instruction and models provide basic cardiac anatomy and physiology, with the coronary electrical and arterial systems. Review of Valvular disease and Heart Failure   Cardiac Procedures: - Group verbal and written instruction to review commonly prescribed medications for heart disease. Reviews the medication, class of the drug, and side effects. Includes the steps to properly store meds and maintain the prescription regimen. (beta blockers and nitrates)   Cardiac Medications I: - Group verbal and written instruction to review commonly prescribed medications for heart disease. Reviews the medication, class of the drug, and side effects. Includes the steps to properly store meds and maintain the prescription regimen.   Cardiac Medications II: -Group verbal and written instruction to review commonly prescribed  medications for heart disease. Reviews the medication, class of the drug, and side effects. (all other drug classes)    Go Sex-Intimacy & Heart Disease, Get SMART - Goal Setting: - Group verbal and written instruction through game format to discuss heart disease and the return to sexual intimacy. Provides group verbal and written material to discuss and apply goal setting through the application of the S.M.A.R.T. Method.   Other Matters of the Heart: - Provides group verbal, written materials and models to describe Stable Angina and Peripheral Artery. Includes description of the disease process and treatment options available to the cardiac patient.   Exercise & Equipment Safety: - Individual verbal instruction and demonstration of equipment use and safety with use of the equipment.   Cardiac Rehab from 01/08/2020 in [facility] Cardiac and Pulmonary Rehab  Date  01/08/20  Educator  [facility]  Instruction Review Code  1- Verbalizes Understanding      Infection Prevention: - Provides verbal and written material to individual with discussion of infection control including proper hand washing and proper equipment cleaning during exercise session.   Cardiac Rehab from 01/08/2020 in [facility] Cardiac and Pulmonary Rehab  Date  01/08/20  Educator  [facility]  Instruction Review Code  1- Verbalizes Understanding      Falls Prevention: - Provides verbal and written material to individual with discussion of falls prevention and safety.   Cardiac Rehab from 01/08/2020 in [facility] Cardiac and Pulmonary Rehab  Date  01/08/20  Educator  [facility]  Instruction Review Code  1- Verbalizes Understanding      Diabetes: - Individual verbal and written instruction to review signs/symptoms of diabetes, desired ranges of glucose level fasting, after meals and with exercise. Acknowledge that pre and post exercise glucose checks will be done for 3 sessions at entry of program.   Cardiac Rehab from 01/08/2020 in [facility] Cardiac and Pulmonary Rehab   Date  01/04/20  Educator  [facility]  Instruction Review Code  1- Verbalizes Understanding      Know Your Numbers and Risk Factors: -Group  verbal and written instruction about important numbers in your health.  Discussion of what are risk factors and how they play a role in the disease process.  Review of Cholesterol, Blood Pressure, Diabetes, and BMI and the role they play in your overall health.   Sleep Hygiene: -Provides group verbal and written instruction about how sleep can affect your health.  Define sleep hygiene, discuss sleep cycles and impact of sleep habits. Review good sleep hygiene tips.    Other: -Provides group and verbal instruction on various topics (see comments)   Knowledge Questionnaire Score: Knowledge Questionnaire Score - 01/08/20 1259      Knowledge Questionnaire Score   Pre Score  20/26 Education Focus: Depression, Angina, Nutrition, Exercise       Core Components/Risk Factors/Patient Goals at Admission: Personal Goals and Risk Factors at Admission - 01/08/20 1259      Core Components/Risk Factors/Patient Goals on Admission    Weight Management  Yes;Obesity;Weight Loss    Intervention  Weight Management: Develop a combined nutrition and exercise program designed to reach desired caloric intake, while maintaining appropriate intake of nutrient and fiber, sodium and fats, and appropriate energy expenditure required for the weight goal.;Weight Management: Provide education and appropriate resources to help participant work on and attain dietary goals.;Weight Management/Obesity: Establish reasonable short term and long term weight goals.;Obesity: Provide education and appropriate resources to help participant work on and attain dietary goals.    Admit Weight  241 lb 11.2 oz (109.6 kg)    Goal Weight: Short Term  235 lb (106.6 kg)    Goal Weight: Long Term  230 lb (104.3 kg)    Expected Outcomes  Short Term: Continue to assess and modify interventions until short  term weight is achieved;Long Term: Adherence to nutrition and physical activity/exercise program aimed toward attainment of established weight goal;Weight Loss: Understanding of general recommendations for a balanced deficit meal plan, which promotes 1-2 lb weight loss per week and includes a negative energy balance of [phone] kcal/d;Understanding recommendations for meals to include 15-35% energy as protein, 25-35% energy from fat, 35-60% energy from carbohydrates, less than 255m of dietary cholesterol, 20-35 gm of total fiber daily;Understanding of distribution of calorie intake throughout the day with the consumption of 4-5 meals/snacks    Diabetes  Yes    Intervention  Provide education about signs/symptoms and action to take for hypo/hyperglycemia.;Provide education about proper nutrition, including hydration, and aerobic/resistive exercise prescription along with prescribed medications to achieve blood glucose in normal ranges: Fasting glucose 65-99 mg/dL    Expected Outcomes  Short Term: Participant verbalizes understanding of the signs/symptoms and immediate care of hyper/hypoglycemia, proper foot care and importance of medication, aerobic/resistive exercise and nutrition plan for blood glucose control.;Long Term: Attainment of HbA1C < 7%.    Lipids  Yes    Intervention  Provide education and support for participant on nutrition & aerobic/resistive exercise along with prescribed medications to achieve LDL <748m HDL >4077m   Expected Outcomes  Short Term: Participant states understanding of desired cholesterol values and is compliant with medications prescribed. Participant is following exercise prescription and nutrition guidelines.;Long Term: Cholesterol controlled with medications as prescribed, with individualized exercise RX and with personalized nutrition plan. Value goals: LDL < 63m28mDL > 40 mg.       Core Components/Risk Factors/Patient Goals Review:  Goals and Risk Factor Review     Row Name 01/18/20 1340             Core Components/Risk Factors/Patient  Goals Review   Personal Goals Review  Weight Management/Obesity;Hypertension       Review  [name] is doing well with his weight and coming down some each day.  Blood pressures are doing well and he checks them at home.  Sugars have also been good.       Expected Outcomes  Short: Continue to work on weight loss  Long: Continue to monitor risk factors          Core Components/Risk Factors/Patient Goals at Discharge (Final Review):  Goals and Risk Factor Review - 01/18/20 1340      Core Components/Risk Factors/Patient Goals Review   Personal Goals Review  Weight Management/Obesity;Hypertension    Review  [name] is doing well with his weight and coming down some each day.  Blood pressures are doing well and he checks them at home.  Sugars have also been good.    Expected Outcomes  Short: Continue to work on weight loss  Long: Continue to monitor risk factors       ITP Comments: ITP Comments    Row Name 01/04/20 1048 01/08/20 1100 01/10/20 1323 01/31/20 1032     ITP Comments  Initial orientation phone call completed. Diagnosis can be found in [facility] 1/29. EP orientation scheduled for 3/1 at 9:30  Completed 6MWT and gym orientation.  Initial ITP created and sent for review to Dr. Emily Filbert, Medical Director.  First full day of exercise!  Patient was oriented to gym and equipment including functions, settings, policies, and procedures.  Patient's individual exercise prescription and treatment plan were reviewed.  All starting workloads were established based on the results of the 6 minute walk test done at initial orientation visit.  The plan for exercise progression was also introduced and progression will be customized based on patient's performance and goals.  30 day chart review completed. ITP sent to Dr Zachery Dakins Medical Director, for review,changes as needed and signature. New to program       Comments:

## 2020-02-01 ENCOUNTER — Other Ambulatory Visit: Payer: Self-pay

## 2020-02-01 ENCOUNTER — Encounter: Payer: Self-pay | Admitting: Internal Medicine

## 2020-02-01 ENCOUNTER — Ambulatory Visit (INDEPENDENT_AMBULATORY_CARE_PROVIDER_SITE_OTHER): Payer: Medicare Other | Admitting: Internal Medicine

## 2020-02-01 VITALS — BP 110/68 | HR 91 | Ht 70.0 in | Wt 238.4 lb

## 2020-02-01 DIAGNOSIS — I428 Other cardiomyopathies: Secondary | ICD-10-CM

## 2020-02-01 DIAGNOSIS — I493 Ventricular premature depolarization: Secondary | ICD-10-CM | POA: Diagnosis not present

## 2020-02-01 MED ORDER — SPIRONOLACTONE 25 MG PO TABS: ORAL_TABLET | ORAL | Status: DC

## 2020-02-01 MED ORDER — LOSARTAN POTASSIUM 25 MG PO TABS: ORAL_TABLET | ORAL | Status: DC

## 2020-02-01 MED ORDER — METOPROLOL SUCCINATE ER 100 MG PO TB24: ORAL_TABLET | ORAL | Status: DC

## 2020-02-01 NOTE — Progress Notes (Signed)
Patient Care Team: Tonia Ghent, MD as PCP - General (Family Medicine) Wellington Hampshire, MD as PCP - Cardiology (Cardiology)   HPI  Tim Mccullough is a 69 y.o. male seen in  for wide complex beats in a pattern of bigeminy in the setting of nonischemic cardiomyopathy initially diagnosed in 2010.  These records are not available but the old chart reports ejection fraction of 35% and no obstructive coronary disease.  It also mentions a hemodynamically insignificant VSD.  He was started on flecainide.    He has been treated with beta-blockers and [facility] Not aware of his ectopy.  No history of tachypalpitations.  No syncope.    Admitted to hospital Monday of this week with supraventricular tachycardia he had been taking a shower became lightheaded and had tachypalpitations.  He thought maybe he was hypoglycemic; blood sugars were normal.  Took his heart rate.  150.  Went to rehab, heart rate was still elevated.  First recorded blood pressure was about 80.-P waves were not discernible and unfortunately telemetry when I thought to review it remotely was unavailable and the transitions of his gradually slowing heart rate were lost.  Flecainide was discontinued and metoprolol was increased.  He has a history of orthostatic lightheadedness.  Particularly with standing quickly.  Not had shower lightheadedness before  Diabetes.  Not supposed to have significant neuropathy.   DATE TEST EF    5/13 Echo   50-55 %    1/21 Echo   30-35 %    2/21 LHC   Cors - normal    Date Cr K Mg Hgb  2/21 1.15 5.1  13.8  3/21  1.32 4.7 1.9<<1.3          Records and Results Reviewed*   Past Medical History:  Diagnosis Date  . Abnormal echocardiogram 2/11   Repeated after med mgmt of cardiomyopathy showed EF 40-45%, mildly dilated LV, mild LV hypertrophy, anterolateral and apical hypokinesis, mild diastolic dysfunction.  . Anemia    hx  . Arthritis   . Blood transfusion without  reported diagnosis 09/2012  . BPH (benign prostatic hypertrophy) 2004  . Cancer ([place])    melanoma back  . Cardiomyopathy    ? tachycardia-mediated  . Depression   . Diabetes mellitus 11/2006   Type II  . Diverticulosis   . Echocardiogram abnormal 7/10   Mild LVH with a mildly dilated LV.  EF was difficult to assess given poor acoustic windows but appeared moderately decreased.  Mild MR.  Probable small perimembranous VSD, mild LAE  . Esophageal erosions   . GERD (gastroesophageal reflux disease)   . Hip pain   . History of MRI 7/10   Cardiac MRI was done to followup difficult echo.  This showed mild to moderately dilated LV, mild to moderate LAE, EF  33% with global hypokinesis, normal RV size with mild systolic dysfunction.  There was no large VSD evident.  There was no myocardial delayed enhancement so no evidence for infiltrative disease or infarction.  LHC (7/10) showed only luminal irreg. in the coronaries and EF 35%  . Holter monitor, abnormal 7/10   Frequent PAC's and PVC's  Average HR 96  (but he had started Toprol XL).  HR range 71-130  . Hyperlipidemia 04/1998  . Hypertension 1995  . Obesity   . OSA (obstructive sleep apnea)    Not using CPAP- improved with weight loss  . Pneumonia  HX OF pna  . Sinus tachycardia    Pt says he has been told his heart rate is high since he was in high school.   . Sleep apnea    has cpap; but does not use  . VSD (ventricular septal defect)    Probable small perimembranous    Past Surgical History:  Procedure Laterality Date  . CARDIAC CATHETERIZATION  05/30/2009   Dilated cardiomyopathy.  min VSD.  Hypokin Inf  wall.  Multiple min luminal Irr.  EF 35%  . COLONOSCOPY W/ BIOPSIES  08/24/2006   Divertics, polyps x3, bx negative, repeat 2012 with mild diverticulosis in teh sigmoid colon and 1 polyp in tranverse colon, repeat due 2017  . ESOPHAGOGASTRODUODENOSCOPY  2012   1)Barrett's esoph, 2) Erosive esophagitis, 3) Mild gastritis in the  antrum, 4) Duodenitis in the bulb of duodenum, 5) Small hiatal hernia  . INGUINAL HERNIA REPAIR  1985   Left  . INGUINAL HERNIA REPAIR  1999   Right  . KNEE ARTHROSCOPY  10/15/2011   Procedure: ARTHROSCOPY KNEE;  Surgeon: Ninetta Lights, MD;  Location: [place];  Service: Orthopedics;  Laterality: Right;  right knee scope with lateral meniscectomy, removal loose foreign body, and microfracture technique  . LAMINECTOMY  1987   with discectomy  . RIGHT/LEFT HEART CATH AND CORONARY ANGIOGRAPHY N/A 12/25/2019   Procedure: RIGHT/LEFT HEART CATH AND CORONARY ANGIOGRAPHY;  Surgeon: Wellington Hampshire, MD;  Location: [place] [facility];  Service: Cardiovascular;  Laterality: N/A;  . ROTATOR CUFF REPAIR  2/11   rt  . TOTAL HIP ARTHROPLASTY  2011   Left  . TOTAL KNEE ARTHROPLASTY  06/01/2012   Procedure: TOTAL KNEE ARTHROPLASTY;  Surgeon: Ninetta Lights, MD;  Location: [place];  Service: Orthopedics;  Laterality: Right;    Current Meds  Medication Sig  . aspirin 81 MG tablet Take 81 mg by mouth daily.   Marland Kitchen atorvastatin (LIPITOR) 40 MG tablet TAKE 1 TABLET BY MOUTH EVERY DAY (Patient taking differently: Take 40 mg by mouth daily. )  . doxazosin (CARDURA) 1 MG tablet 1 tablet daily  . fish oil-omega-3 fatty acids 1000 MG capsule Take 1 g by mouth 2 (two) times daily.   Marland Kitchen losartan (COZAAR) 25 MG tablet Take 1 tablet (25 mg total) by mouth daily.  . magnesium oxide (MAG-OX) 400 (241.3 Mg) MG tablet Take 1 tablet (400 mg total) by mouth daily.  . metFORMIN (GLUCOPHAGE) 500 MG tablet TAKE 2 TABLETS (1,000 MG TOTAL) BY MOUTH 2 (TWO) TIMES DAILY WITH A MEAL.  . metoprolol succinate (TOPROL-XL) 100 MG 24 hr tablet Take 1 tablet (100 mg total) by mouth daily. Take with or immediately following a meal.  . omeprazole (PRILOSEC) 20 MG capsule Take 1 capsule (20 mg total) by mouth daily.  . ONE TOUCH ULTRA TEST test strip CHECK BLOOD SUGAR DAILY AND IF BLOOD SUGAR FLUCTUATES. 250.00  .  PARoxetine (PAXIL) 20 MG tablet Take 1 tablet (20 mg total) by mouth daily.  Marland Kitchen spironolactone (ALDACTONE) 25 MG tablet Take 1 tablet (25 mg total) by mouth daily.  Marland Kitchen triamcinolone ointment (KENALOG) 0.1 % Apply 1 application topically 2 (two) times daily as needed.     No Known Allergies    Review of Systems negative except from HPI and PMH  Physical Exam BP 110/68 (BP Location: Left Arm, Patient Position: Sitting, Cuff Size: Normal)   Pulse 91   Ht 5\' 10"  (1.778 m)   Wt 238 lb  6 oz (108.1 kg)   SpO2 98%   BMI 34.20 kg/m  Well developed and well nourished in no acute distress HENT normal E scleral and icterus clear Neck Supple JVP flat; carotids brisk and full Clear to ausculation Regular rate and rhythm, no murmurs gallops or rub Soft with active bowel sounds No clubbing cyanosis  Edema Alert and oriented, grossly normal motor and sensory function Skin Warm and Dry  ECG sinus rhythm at 90 Interval 17/14/37 Right bundle branch block Wide-complex beats fused and following the P waves.  Consistent with ectopic beats  Estimated Creatinine Clearance: 65.9 mL/min (A) (by C-G formula based on SCr of 1.32 mg/dL (H)).   Assessment and  Plan Nonischemic cardiomyopathy   Bigeminal wide-complex beat,  relatively narrow with a prolonged intrinsicoid deflection and a fused appearance suggestive of a junctional trigger capturing the intrinsic conduction system. \ Sleep disordered breathing/sleep apnea   Obesity   Diabetes   Hypertension  Orthostatic hypotension    I suspect his tachycardia was sinus.  Unfortunately we do not have the offset from telemetry.  However, there are intermediate heart rates from 140-120-110.  Moreover, orthostatic vital signs today initiated by [facility] were illuminating with a 20 point millimeters drop in blood pressure and a 10+ increase in heart rate.  I am not sure how it was related to the flecainide; I suspect it was not.  Showers can  frequently trigger this; however, it had not done it previously.  He does have some degree of autonomic neuropathy I suspect with his diabetes as evidenced today.  For now, given his relatively low blood pressure we will back off on his spironolactone-25---12.5; we will have him take his Cozaar 25 at night and have him take his metoprolol at 50 twice daily.  He will do orthostatics at home.  We will also reschedule his appoint with Dr. Elliot Cousin.  As I look at the tracings I am less sanguine that ablation will be successful because I suspect that the ectopic trigger mostly very close to the intrinsic conduction system and hence may be not available to catheter ablation without causing complete heart block.  If that turns out to be the case I would resume antiarrhythmic therapy and would probably try again a 1C  We have discussed resuming flecainide.  Depending on his vital signs and visit in 2 weeks, we may have him resume a 25 twice daily     Current medicines are reviewed at length with the patient today .  The patient does not  have concerns regarding medicines.

## 2020-02-01 NOTE — Patient Instructions (Addendum)
Medication Instructions:  - Your physician has recommended you make the following change in your medication:   1) Decrease spironolactone 25 mg- take 0.5 tablet (12.5 mg) by mouth once daily at bedtime  2) Change metoprolol succinate 100 mg- take 0.5 tablet (50 mg) by mouth twice daily  3) Change losartan 25 mg- take 1 tablet (25 mg) by mouth once daily at bedtime  *If you need a refill on your cardiac medications before your next appointment, please call your pharmacy*   Lab Work: - none ordered  If you have labs (blood work) drawn today and your tests are completely normal, you will receive your results only by: Marland Kitchen MyChart Message (if you have MyChart) OR . A paper copy in the mail If you have any lab test that is abnormal or we need to change your treatment, we will call you to review the results.   Testing/Procedures: - none ordered   Follow-Up: At [facility], you and your health needs are our priority.  As part of our continuing mission to provide you with exceptional heart care, we have created designated Provider Care Teams.  These Care Teams include your primary Cardiologist (physician) and Advanced Practice Providers (APPs -  Physician Assistants and Nurse Practitioners) who all work together to provide you with the care you need, when you need it.  We recommend signing up for the patient portal called "MyChart".  Sign up information is provided on this After Visit Summary.  MyChart is used to connect with patients for Virtual Visits (Telemedicine).  Patients are able to view lab/test results, encounter notes, upcoming appointments, etc.  Non-urgent messages can be sent to your provider as well.   To learn more about what you can do with MyChart, go to [email or web address].    Your next appointment:   2 week(s)  The format for your next appointment:   Either In Person or Virtual  Provider:   Virl Axe, MD   Other Instructions - You have been referred to Dr.  Cristopher Peru (to follow up in 3-4 weeks) in the [facility] office and will be called to schedule.   - You may resume Cardiac Rehab

## 2020-02-02 ENCOUNTER — Other Ambulatory Visit: Payer: Self-pay | Admitting: Internal Medicine

## 2020-02-05 ENCOUNTER — Other Ambulatory Visit: Payer: Self-pay

## 2020-02-05 ENCOUNTER — Encounter: Payer: Medicare Other | Admitting: *Deleted

## 2020-02-05 DIAGNOSIS — I5022 Chronic systolic (congestive) heart failure: Secondary | ICD-10-CM

## 2020-02-05 DIAGNOSIS — E119 Type 2 diabetes mellitus without complications: Secondary | ICD-10-CM | POA: Diagnosis not present

## 2020-02-05 DIAGNOSIS — I110 Hypertensive heart disease with heart failure: Secondary | ICD-10-CM | POA: Diagnosis not present

## 2020-02-05 DIAGNOSIS — Z87891 Personal history of nicotine dependence: Secondary | ICD-10-CM | POA: Diagnosis not present

## 2020-02-05 NOTE — Progress Notes (Signed)
Daily Session Note  Patient Details  Name: Tim Mccullough MRN: [id number] Date of Birth: 1950-11-23 Referring Provider:     Cardiac Rehab from 01/08/2020 in [facility] Cardiac and Pulmonary Rehab  Referring Provider  [name] MD      Encounter Date: 02/05/2020  Check In: Session Check In - 02/05/20 1316      Check-In   Supervising physician immediately available to respond to emergencies  See telemetry face sheet for immediately available ER MD    Location  [facility]-Cardiac & Pulmonary Rehab    Staff Present  Heath Lark, RN, BSN, CCRP;Meredith Sherryll Burger, RN BSN;Melissa South Frydek RDN, LDN;Jessica Paterson, MA, RCEP, CCRP, [place], BS, ACSM CEP, Exercise Physiologist    Virtual Visit  No    Medication changes reported      No    Fall or balance concerns reported     No    Warm-up and Cool-down  Performed on first and last piece of equipment    Resistance Training Performed  Yes    VAD Patient?  No    PAD/SET Patient?  No      Pain Assessment   Currently in Pain?  No/denies          Social History   Tobacco Use  Smoking Status Former Smoker  . Years: 20.00  . Types: Cigarettes  . Quit date: 11/09/1998  . Years since quitting: 21.2  Smokeless Tobacco Former Systems developer  . Quit date: 06/03/2007  Tobacco Comment   no alcohol since nov 12    Goals Met:  Independence with exercise equipment Exercise tolerated well No report of cardiac concerns or symptoms  Goals Unmet:  Not Applicable  Comments: Pt able to follow exercise prescription today without complaint.  Will continue to monitor for progression.    Dr. Emily Filbert is Medical Director for [place] [facility].

## 2020-02-06 ENCOUNTER — Telehealth: Payer: Self-pay | Admitting: Internal Medicine

## 2020-02-06 NOTE — Telephone Encounter (Signed)
I spoke with Dr. Caryl Comes about the patient's reported bradycardia.  Per Dr. Caryl Comes, the patient is most likely having PVC's and if he is not symptomatic, he will not make any changes at this time.  I have spoken with the patient. He is taking his BP (HR) with an automatic BP cuff. He is also taking his pulse with a pulse oximeter.   I have advised the patient he is most likely having PVC's that his monitor and pulse ox is not picking up on. He confirms he is not having any symptoms of dizziness/ lightheadedness at this time.  The patient has been advised to call back if he starts having symptoms of dizziness/ lightheadedness with associated bradycardia. He voices understanding and is agreeable.

## 2020-02-06 NOTE — Telephone Encounter (Signed)
Pt c/o BP issue: STAT if pt c/o blurred vision, one-sided weakness or slurred speech  1. What are your last 5 BP readings?  109/66 HR 47 laying down 119/63 HR 42 standing up  128/66 HR 47 laying down 133/63 HR 45 standing up  127/69 HR 71 then in the 40s laying down 101/68 HR 46 standing up  2. Are you having any other symptoms (ex. Dizziness, headache, blurred vision, passed out)? no  3. What is your BP issue? Patient's HR has been in the 40s.  Wants to make sure this is ok.  Please call to discuss.

## 2020-02-06 NOTE — Telephone Encounter (Signed)
The patient has a history of of wide complex beats/ bigeminy.   To Dr. Caryl Comes to review prior to calling the patient.

## 2020-02-07 ENCOUNTER — Encounter: Payer: Medicare Other | Admitting: *Deleted

## 2020-02-07 ENCOUNTER — Other Ambulatory Visit: Payer: Self-pay

## 2020-02-07 DIAGNOSIS — Z87891 Personal history of nicotine dependence: Secondary | ICD-10-CM | POA: Diagnosis not present

## 2020-02-07 DIAGNOSIS — I5022 Chronic systolic (congestive) heart failure: Secondary | ICD-10-CM | POA: Diagnosis not present

## 2020-02-07 DIAGNOSIS — I110 Hypertensive heart disease with heart failure: Secondary | ICD-10-CM | POA: Diagnosis not present

## 2020-02-07 DIAGNOSIS — E119 Type 2 diabetes mellitus without complications: Secondary | ICD-10-CM | POA: Diagnosis not present

## 2020-02-07 NOTE — Progress Notes (Signed)
Daily Session Note  Patient Details  Name: Tim Mccullough MRN: [id number] Date of Birth: 1951/06/06 Referring Provider:     Cardiac Rehab from 01/08/2020 in [facility] Cardiac and Pulmonary Rehab  Referring Provider  [name] MD      Encounter Date: 02/07/2020  Check In: Session Check In - 02/07/20 1336      Check-In   Supervising physician immediately available to respond to emergencies  See telemetry face sheet for immediately available ER MD    Staff Present  Nyoka Cowden, RN, BSN, Ardeth Sportsman RDN, LDN;Joseph Tessie Fass RCP,RRT,BSRT    Virtual Visit  No    Medication changes reported      No    Warm-up and Cool-down  Performed on first and last piece of equipment    Resistance Training Performed  Yes    VAD Patient?  No    PAD/SET Patient?  No      Pain Assessment   Currently in Pain?  No/denies           Social History   Tobacco Use  Smoking Status Former Smoker  . Years: 20.00  . Types: Cigarettes  . Quit date: 11/09/1998  . Years since quitting: 21.2  Smokeless Tobacco Former Systems developer  . Quit date: 06/03/2007  Tobacco Comment   no alcohol since nov 12    Goals Met:  Independence with exercise equipment Exercise tolerated well No report of cardiac concerns or symptoms  Goals Unmet:  Not Applicable  Comments: Pt able to follow exercise prescription today without complaint.  Will continue to monitor for progression.   Dr. Emily Filbert is Medical Director for [place] and [facility].

## 2020-02-08 ENCOUNTER — Other Ambulatory Visit: Payer: Self-pay

## 2020-02-08 ENCOUNTER — Encounter: Payer: Medicare Other | Attending: Cardiovascular Disease | Admitting: *Deleted

## 2020-02-08 ENCOUNTER — Institutional Professional Consult (permissible substitution): Payer: Medicare Other | Admitting: Internal Medicine

## 2020-02-08 DIAGNOSIS — I5022 Chronic systolic (congestive) heart failure: Secondary | ICD-10-CM | POA: Diagnosis not present

## 2020-02-08 DIAGNOSIS — I110 Hypertensive heart disease with heart failure: Secondary | ICD-10-CM | POA: Diagnosis not present

## 2020-02-08 DIAGNOSIS — Z87891 Personal history of nicotine dependence: Secondary | ICD-10-CM | POA: Insufficient documentation

## 2020-02-08 DIAGNOSIS — E119 Type 2 diabetes mellitus without complications: Secondary | ICD-10-CM | POA: Insufficient documentation

## 2020-02-08 NOTE — Progress Notes (Signed)
Daily Session Note  Patient Details  Name: Tim Mccullough MRN: [id number] Date of Birth: [birth year] Referring Provider:     Cardiac Rehab from 01/08/2020 in [facility] Cardiac and Pulmonary Rehab  Referring Provider  [name] MD      Encounter Date: 02/08/2020  Check In: Session Check In - 02/08/20 1303      Check-In   Supervising physician immediately available to respond to emergencies  See telemetry face sheet for immediately available ER MD    Location  [facility]-Cardiac & Pulmonary Rehab    Staff Present  Renita Papa, RN BSN;Joseph [address] [place], [place], [place], CCRP, CCET    Virtual Visit  No    Medication changes reported      No    Fall or balance concerns reported     No    Warm-up and Cool-down  Performed on first and last piece of equipment    Resistance Training Performed  Yes    VAD Patient?  No    PAD/SET Patient?  No      Pain Assessment   Currently in Pain?  No/denies          Social History   Tobacco Use  Smoking Status Former Smoker  . Years: 20.00  . Types: Cigarettes  . Quit date: 11/09/1998  . Years since quitting: 21.2  Smokeless Tobacco Former Systems developer  . Quit date: 06/03/2007  Tobacco Comment   no alcohol since nov 12    Goals Met:  Independence with exercise equipment Exercise tolerated well No report of cardiac concerns or symptoms Strength training completed today  Goals Unmet:  Not Applicable  Comments: Pt able to follow exercise prescription today without complaint.  Will continue to monitor for progression.    Dr. Emily Filbert is Medical Director for [place] and [facility].

## 2020-02-12 ENCOUNTER — Encounter: Payer: Medicare Other | Admitting: *Deleted

## 2020-02-12 ENCOUNTER — Other Ambulatory Visit: Payer: Self-pay

## 2020-02-12 DIAGNOSIS — E119 Type 2 diabetes mellitus without complications: Secondary | ICD-10-CM | POA: Diagnosis not present

## 2020-02-12 DIAGNOSIS — Z87891 Personal history of nicotine dependence: Secondary | ICD-10-CM | POA: Diagnosis not present

## 2020-02-12 DIAGNOSIS — I5022 Chronic systolic (congestive) heart failure: Secondary | ICD-10-CM

## 2020-02-12 DIAGNOSIS — I110 Hypertensive heart disease with heart failure: Secondary | ICD-10-CM | POA: Diagnosis not present

## 2020-02-12 NOTE — Telephone Encounter (Signed)
Noted Thanks SK   

## 2020-02-12 NOTE — Progress Notes (Signed)
Daily Session Note  Patient Details  Name: Tim Mccullough MRN: [id number] Date of Birth: 10-12-1951 Referring Provider:     Cardiac Rehab from 01/08/2020 in [facility] Cardiac and Pulmonary Rehab  Referring Provider  [name] MD      Encounter Date: 02/12/2020  Check In: Session Check In - 02/12/20 1302      Check-In   Supervising physician immediately available to respond to emergencies  See telemetry face sheet for immediately available ER MD    Location  [facility]-Cardiac & Pulmonary Rehab    Staff Present  Heath Lark, RN, BSN, Laveda Norman, BS, ACSM CEP, Exercise Physiologist;Melissa Caiola RDN, LDN;Joseph Hood RCP,RRT,BSRT    Virtual Visit  No    Medication changes reported      No    Fall or balance concerns reported     No    Warm-up and Cool-down  Performed on first and last piece of equipment    Resistance Training Performed  Yes    VAD Patient?  No    PAD/SET Patient?  No      Pain Assessment   Currently in Pain?  No/denies          Social History   Tobacco Use  Smoking Status Former Smoker  . Years: 20.00  . Types: Cigarettes  . Quit date: 11/09/1998  . Years since quitting: 21.2  Smokeless Tobacco Former Systems developer  . Quit date: 06/03/2007  Tobacco Comment   no alcohol since nov 12    Goals Met:  Independence with exercise equipment Exercise tolerated well No report of cardiac concerns or symptoms  Goals Unmet:  Not Applicable  Comments: Pt able to follow exercise prescription today without complaint.  Will continue to monitor for progression.    Dr. Emily Filbert is Medical Director for [place] and [facility].

## 2020-02-14 ENCOUNTER — Ambulatory Visit: Payer: Medicare Other | Admitting: Internal Medicine

## 2020-02-15 ENCOUNTER — Ambulatory Visit (INDEPENDENT_AMBULATORY_CARE_PROVIDER_SITE_OTHER): Payer: Medicare Other | Admitting: Internal Medicine

## 2020-02-15 ENCOUNTER — Other Ambulatory Visit: Payer: Self-pay

## 2020-02-15 ENCOUNTER — Encounter: Payer: Self-pay | Admitting: Internal Medicine

## 2020-02-15 DIAGNOSIS — I951 Orthostatic hypotension: Secondary | ICD-10-CM | POA: Diagnosis not present

## 2020-02-15 DIAGNOSIS — I428 Other cardiomyopathies: Secondary | ICD-10-CM

## 2020-02-15 NOTE — Progress Notes (Signed)
Patient Care Team: Tonia Ghent, MD as PCP - General (Family Medicine) Wellington Hampshire, MD as PCP - Cardiology (Cardiology)   HPI  Tim Mccullough is a 69 y.o. male seen in  for wide complex beats in a pattern of bigeminy in the setting of nonischemic cardiomyopathy initially diagnosed in 2010.  These records are not available but the old chart reports ejection fraction of 35% and no obstructive coronary disease.  It also mentions a hemodynamically insignificant VSD.  He was started on flecainide.    He has been treated with beta-blockers and [facility] Not aware of his ectopy.  No history of tachypalpitations.  No syncope.    Admitted to hospital Monday of this week with supraventricular tachycardia he had been taking a shower became lightheaded and had tachypalpitations.  He thought maybe he was hypoglycemic; blood sugars were normal.  Took his heart rate.  150.  Went to rehab, heart rate was still elevated.  First recorded blood pressure was about 80.-P waves were not discernible and unfortunately telemetry when I thought to review it remotely was unavailable and the transitions of his gradually slowing heart rate were lost.  Flecainide was discontinued and metoprolol was increased.  History of orthostatic lightheadedness.  Particularly with standing quickly.  Not had shower lightheadedness before; at last visit, antihypertensives were adjusted as noted previously.  No interval dizziness.  No chest pain or shortness of breath.  Diabetes.  Not supposed to have significant neuropathy.   DATE TEST EF    5/13 Echo   50-55 %    1/21 Echo   30-35 %    2/21 LHC   [place] - normal    Date Cr K Mg Hgb  2/21 1.15 5.1  13.8  3/21  1.32 4.7 1.9<<1.3          Records and Results Reviewed*   Past Medical History:  Diagnosis Date  . Abnormal echocardiogram 2/11   Repeated after med mgmt of cardiomyopathy showed EF 40-45%, mildly dilated LV, mild LV hypertrophy,  anterolateral and apical hypokinesis, mild diastolic dysfunction.  . Anemia    hx  . Arthritis   . Blood transfusion without reported diagnosis 09/2012  . BPH (benign prostatic hypertrophy) 2004  . Cancer ([place])    melanoma back  . Cardiomyopathy    ? tachycardia-mediated  . Depression   . Diabetes mellitus 11/2006   Type II  . Diverticulosis   . Echocardiogram abnormal 7/10   Mild LVH with a mildly dilated LV.  EF was difficult to assess given poor acoustic windows but appeared moderately decreased.  Mild MR.  Probable small perimembranous VSD, mild LAE  . Esophageal erosions   . GERD (gastroesophageal reflux disease)   . Hip pain   . History of MRI 7/10   Cardiac MRI was done to followup difficult echo.  This showed mild to moderately dilated LV, mild to moderate LAE, EF  33% with global hypokinesis, normal RV size with mild systolic dysfunction.  There was no large VSD evident.  There was no myocardial delayed enhancement so no evidence for infiltrative disease or infarction.  LHC (7/10) showed only luminal irreg. in the coronaries and EF 35%  . Holter monitor, abnormal 7/10   Frequent PAC's and PVC's  Average HR 96  (but he had started Toprol XL).  HR range 71-130  . Hyperlipidemia 04/1998  . Hypertension 1995  . Obesity   . OSA (obstructive  sleep apnea)    Not using CPAP- improved with weight loss  . Pneumonia    HX OF pna  . Sinus tachycardia    Pt says he has been told his heart rate is high since he was in high school.   . Sleep apnea    has cpap; but does not use  . VSD (ventricular septal defect)    Probable small perimembranous    Past Surgical History:  Procedure Laterality Date  . CARDIAC CATHETERIZATION  05/30/2009   Dilated cardiomyopathy.  min VSD.  Hypokin Inf  wall.  Multiple min luminal Irr.  EF 35%  . COLONOSCOPY W/ BIOPSIES  08/24/2006   Divertics, polyps x3, bx negative, repeat 2012 with mild diverticulosis in teh sigmoid colon and 1 polyp in tranverse  colon, repeat due 2017  . ESOPHAGOGASTRODUODENOSCOPY  2012   1)Barrett's esoph, 2) Erosive esophagitis, 3) Mild gastritis in the antrum, 4) Duodenitis in the bulb of duodenum, 5) Small hiatal hernia  . INGUINAL HERNIA REPAIR  1985   Left  . INGUINAL HERNIA REPAIR  1999   Right  . KNEE ARTHROSCOPY  10/15/2011   Procedure: ARTHROSCOPY KNEE;  Surgeon: Ninetta Lights, MD;  Location: [place];  Service: Orthopedics;  Laterality: Right;  right knee scope with lateral meniscectomy, removal loose foreign body, and microfracture technique  . LAMINECTOMY  1987   with discectomy  . RIGHT/LEFT HEART CATH AND CORONARY ANGIOGRAPHY N/A 12/25/2019   Procedure: RIGHT/LEFT HEART CATH AND CORONARY ANGIOGRAPHY;  Surgeon: Wellington Hampshire, MD;  Location: [place] [facility];  Service: Cardiovascular;  Laterality: N/A;  . ROTATOR CUFF REPAIR  2/11   rt  . TOTAL HIP ARTHROPLASTY  2011   Left  . TOTAL KNEE ARTHROPLASTY  06/01/2012   Procedure: TOTAL KNEE ARTHROPLASTY;  Surgeon: Ninetta Lights, MD;  Location: [place];  Service: Orthopedics;  Laterality: Right;    Current Meds  Medication Sig  . aspirin 81 MG tablet Take 81 mg by mouth daily.   Marland Kitchen atorvastatin (LIPITOR) 40 MG tablet TAKE 1 TABLET BY MOUTH EVERY DAY (Patient taking differently: Take 40 mg by mouth daily. )  . doxazosin (CARDURA) 1 MG tablet 1 tablet daily  . fish oil-omega-3 fatty acids 1000 MG capsule Take 1 g by mouth 2 (two) times daily.   Marland Kitchen losartan (COZAAR) 25 MG tablet Take 1 tablet (25 mg) by mouth once daily at bedtime  . magnesium oxide (MAG-OX) 400 (241.3 Mg) MG tablet Take 1 tablet (400 mg total) by mouth daily.  . metFORMIN (GLUCOPHAGE) 500 MG tablet TAKE 2 TABLETS (1,000 MG TOTAL) BY MOUTH 2 (TWO) TIMES DAILY WITH A MEAL.  . metoprolol succinate (TOPROL-XL) 100 MG 24 hr tablet Take 0.5 tablet (50 mg) by mouth twice daily. Take with or immediately following a meal.  . omeprazole (PRILOSEC) 20 MG capsule Take 1 capsule  (20 mg total) by mouth daily.  . ONE TOUCH ULTRA TEST test strip CHECK BLOOD SUGAR DAILY AND IF BLOOD SUGAR FLUCTUATES. 250.00  . PARoxetine (PAXIL) 20 MG tablet Take 1 tablet (20 mg total) by mouth daily.  Marland Kitchen spironolactone (ALDACTONE) 25 MG tablet Take 0.5 tablet (12.5 mg) by mouth once daily at bedtime  . triamcinolone ointment (KENALOG) 0.1 % Apply 1 application topically 2 (two) times daily as needed.     No Known Allergies    Review of Systems negative except from HPI and PMH  Physical Exam BP 130/68 (BP Location: Left Arm,  Patient Position: Sitting, Cuff Size: Normal)   Pulse 91   Ht 5\' 10"  (1.778 m)   Wt 230 lb 6 oz (104.5 kg)   SpO2 97%   BMI 33.06 kg/m  Well developed and nourished in no acute distress HENT normal Neck supple with JVP-  Flat  Clear Regular rate and rhythm, no murmurs or gallops Abd-soft with active BS No Clubbing cyanosis edema Skin-warm and dry A & Oriented  Grossly normal sensory and motor function  ECG sinus at 91 Intervals 15/13/39 Ventricular ectopy-frequent (question septal as noted previously secondary to relatively narrow QRS in the late intrinsicoid deflection)  Assessment and  Plan Nonischemic cardiomyopathy   Bigeminal wide-complex beat,  relatively narrow with a prolonged intrinsicoid deflection and a fused appearance suggestive of a junctional trigger capturing the intrinsic conduction system. \ Sleep disordered breathing/sleep apnea   Obesity   Diabetes   Hypertension  Orthostatic hypotension   Orthostasis is better we will maintain him on his current antihypertensive regime  Blood pressure at home is also very reasonable.  Orthostatic drops at home are less than 20 mm  Is to see Dr. Elliot Cousin in a few weeks to consider ablation of his ectopy.  I wonder increasingly about the origin of the beats--? Septal   If not felt to be an ablation candidate, would resume flecainide or possibly try dronaderone.      I suspect his  tachycardia was sinus.  Unfortunately we do not have the offset from telemetry.  However, there are intermediate heart rates from 140-120-110.  Moreover, orthostatic vital signs today initiated by [facility] were illuminating with a 20 point millimeters drop in blood pressure and a 10+ increase in heart rate.  I am not sure how it was related to the flecainide; I suspect it was not.  Showers can frequently trigger this; however, it had not done it previously.  He does have some degree of autonomic neuropathy I suspect with his diabetes as evidenced today.  For now, given his relatively low blood pressure we will back off on his spironolactone-25---12.5; we will have him take his Cozaar 25 at night and have him take his metoprolol at 50 twice daily.  He will do orthostatics at home.  We will also reschedule his appoint with Dr. Elliot Cousin.  As I look at the tracings I am less sanguine that ablation will be successful because I suspect that the ectopic trigger mostly very close to the intrinsic conduction system and hence may be not available to catheter ablation without causing complete heart block.  If that turns out to be the case I would resume antiarrhythmic therapy and would probably try again a 1C  We have discussed resuming flecainide.  Depending on his vital signs and visit in 2 weeks, we may have him resume a 25 twice daily     Current medicines are reviewed at length with the patient today .  The patient does not  have concerns regarding medicines.

## 2020-02-15 NOTE — Patient Instructions (Signed)
Medication Instructions:  Your physician recommends that you continue on your current medications as directed. Please refer to the Current Medication list given to you today.  *If you need a refill on your cardiac medications before your next appointment, please call your pharmacy*   Lab Work: None ordered.  If you have labs (blood work) drawn today and your tests are completely normal, you will receive your results only by: Marland Kitchen MyChart Message (if you have MyChart) OR . A paper copy in the mail If you have any lab test that is abnormal or we need to change your treatment, we will call you to review the results.   Testing/Procedures: None ordered.    Follow-Up: At [facility], you and your health needs are our priority.  As part of our continuing mission to provide you with exceptional heart care, we have created designated Provider Care Teams.  These Care Teams include your primary Cardiologist (physician) and Advanced Practice Providers (APPs -  Physician Assistants and Nurse Practitioners) who all work together to provide you with the care you need, when you need it.  We recommend signing up for the patient portal called "MyChart".  Sign up information is provided on this After Visit Summary.  MyChart is used to connect with patients for Virtual Visits (Telemedicine).  Patients are able to view lab/test results, encounter notes, upcoming appointments, etc.  Non-urgent messages can be sent to your provider as well.   To learn more about what you can do with MyChart, go to [email or web address].    Your next appointment:   Dependent upon visit with Dr Lovena Le  The format for your next appointment:   In Person  Provider:   Cristopher Peru, MD

## 2020-02-19 ENCOUNTER — Other Ambulatory Visit: Payer: Self-pay

## 2020-02-19 ENCOUNTER — Encounter: Payer: Medicare Other | Admitting: *Deleted

## 2020-02-19 DIAGNOSIS — I110 Hypertensive heart disease with heart failure: Secondary | ICD-10-CM | POA: Diagnosis not present

## 2020-02-19 DIAGNOSIS — E119 Type 2 diabetes mellitus without complications: Secondary | ICD-10-CM | POA: Diagnosis not present

## 2020-02-19 DIAGNOSIS — Z87891 Personal history of nicotine dependence: Secondary | ICD-10-CM | POA: Diagnosis not present

## 2020-02-19 DIAGNOSIS — I5022 Chronic systolic (congestive) heart failure: Secondary | ICD-10-CM | POA: Diagnosis not present

## 2020-02-19 NOTE — Progress Notes (Signed)
Daily Session Note  Patient Details  Name: Tim Mccullough MRN: [id number] Date of Birth: [birth year] Referring Provider:     Cardiac Rehab from 01/08/2020 in [facility] Cardiac and Pulmonary Rehab  Referring Provider  [name] MD      Encounter Date: 02/19/2020  Check In: Session Check In - 02/19/20 1304      Check-In   Supervising physician immediately available to respond to emergencies  See telemetry face sheet for immediately available ER MD    Location  [facility]-Cardiac & Pulmonary Rehab    Staff Present  Heath Lark, RN, BSN, CCRP;Meredith Sherryll Burger, RN BSN;Melissa Kennedy RDN, LDN;Joseph Hood [place], [place], [place], CCRP, [place], [place], ACSM CEP, Exercise Physiologist    Virtual Visit  No    Medication changes reported      No    Fall or balance concerns reported     No    Warm-up and Cool-down  Performed on first and last piece of equipment    Resistance Training Performed  Yes    VAD Patient?  No    PAD/SET Patient?  No      Pain Assessment   Currently in Pain?  No/denies          Social History   Tobacco Use  Smoking Status Former Smoker  . Years: 20.00  . Types: Cigarettes  . Quit date: 11/09/1998  . Years since quitting: 21.2  Smokeless Tobacco Former Systems developer  . Quit date: 06/03/2007  Tobacco Comment   no alcohol since nov 12    Goals Met:  Independence with exercise equipment Exercise tolerated well No report of cardiac concerns or symptoms  Goals Unmet:  Not Applicable  Comments: Pt able to follow exercise prescription today without complaint.  Will continue to monitor for progression.    Dr. Emily Filbert is Medical Director for [place] and [facility].

## 2020-02-20 ENCOUNTER — Other Ambulatory Visit: Payer: Self-pay

## 2020-02-20 DIAGNOSIS — I5022 Chronic systolic (congestive) heart failure: Secondary | ICD-10-CM

## 2020-02-20 NOTE — Progress Notes (Signed)
Completed Initial RD Eval 

## 2020-02-21 ENCOUNTER — Other Ambulatory Visit: Payer: Self-pay

## 2020-02-21 ENCOUNTER — Encounter: Payer: Medicare Other | Admitting: *Deleted

## 2020-02-21 DIAGNOSIS — I5022 Chronic systolic (congestive) heart failure: Secondary | ICD-10-CM | POA: Diagnosis not present

## 2020-02-21 DIAGNOSIS — Z87891 Personal history of nicotine dependence: Secondary | ICD-10-CM | POA: Diagnosis not present

## 2020-02-21 DIAGNOSIS — I110 Hypertensive heart disease with heart failure: Secondary | ICD-10-CM | POA: Diagnosis not present

## 2020-02-21 DIAGNOSIS — E119 Type 2 diabetes mellitus without complications: Secondary | ICD-10-CM | POA: Diagnosis not present

## 2020-02-21 NOTE — Progress Notes (Signed)
Daily Session Note  Patient Details  Name: Tim Mccullough MRN: [id number] Date of Birth: 11/28/1950 Referring Provider:     Cardiac Rehab from 01/08/2020 in [facility] Cardiac and Pulmonary Rehab  Referring Provider  [name] MD      Encounter Date: 02/21/2020  Check In: Session Check In - 02/21/20 1308      Check-In   Supervising physician immediately available to respond to emergencies  See telemetry face sheet for immediately available ER MD    Location  [facility]-Cardiac & Pulmonary Rehab    Staff Present  Heath Lark, RN, BSN, CCRP;Meredith Sherryll Burger, RN BSN;Joseph Hood RCP,RRT,BSRT;Melissa [place] RDN, LDN    Virtual Visit  No    Medication changes reported      No    Fall or balance concerns reported     No    Warm-up and Cool-down  Performed on first and last piece of equipment    Resistance Training Performed  Yes    VAD Patient?  No    PAD/SET Patient?  No      Pain Assessment   Currently in Pain?  No/denies          Social History   Tobacco Use  Smoking Status Former Smoker  . Years: 20.00  . Types: Cigarettes  . Quit date: 11/09/1998  . Years since quitting: 21.2  Smokeless Tobacco Former Systems developer  . Quit date: 06/03/2007  Tobacco Comment   no alcohol since nov 12    Goals Met:  Independence with exercise equipment Exercise tolerated well No report of cardiac concerns or symptoms  Goals Unmet:  Not Applicable  Comments: Pt able to follow exercise prescription today without complaint.  Will continue to monitor for progression.    Dr. Emily Filbert is Medical Director for [place] and [facility].

## 2020-02-22 ENCOUNTER — Other Ambulatory Visit: Payer: Self-pay

## 2020-02-22 ENCOUNTER — Encounter: Payer: Medicare Other | Admitting: *Deleted

## 2020-02-22 DIAGNOSIS — I110 Hypertensive heart disease with heart failure: Secondary | ICD-10-CM | POA: Diagnosis not present

## 2020-02-22 DIAGNOSIS — E119 Type 2 diabetes mellitus without complications: Secondary | ICD-10-CM | POA: Diagnosis not present

## 2020-02-22 DIAGNOSIS — Z87891 Personal history of nicotine dependence: Secondary | ICD-10-CM | POA: Diagnosis not present

## 2020-02-22 DIAGNOSIS — I5022 Chronic systolic (congestive) heart failure: Secondary | ICD-10-CM | POA: Diagnosis not present

## 2020-02-22 NOTE — Progress Notes (Signed)
Daily Session Note  Patient Details  Name: Tim Mccullough MRN: [id number] Date of Birth: 06/14/1951 Referring Provider:     Cardiac Rehab from 01/08/2020 in [facility] Cardiac and Pulmonary Rehab  Referring Provider  [name] MD      Encounter Date: 02/22/2020  Check In: Session Check In - 02/22/20 1315      Check-In   Supervising physician immediately available to respond to emergencies  See telemetry face sheet for immediately available ER MD    Location  [facility]-Cardiac & Pulmonary Rehab    Staff Present  Heath Lark, RN, BSN, CCRP;Meredith [place], RN BSN;Jessica [place], MA, RCEP, CCRP, CCET;Joseph [place], [place], ACSM CEP, Exercise Physiologist    Virtual Visit  No    Medication changes reported      No    Fall or balance concerns reported     No    Warm-up and Cool-down  Performed on first and last piece of equipment    Resistance Training Performed  Yes    VAD Patient?  No    PAD/SET Patient?  No      Pain Assessment   Currently in Pain?  No/denies          Social History   Tobacco Use  Smoking Status Former Smoker  . Years: 20.00  . Types: Cigarettes  . Quit date: 11/09/1998  . Years since quitting: 21.3  Smokeless Tobacco Former Systems developer  . Quit date: 06/03/2007  Tobacco Comment   no alcohol since nov 12    Goals Met:  Independence with exercise equipment Exercise tolerated well No report of cardiac concerns or symptoms  Goals Unmet:  Not Applicable  Comments: Pt able to follow exercise prescription today without complaint.  Will continue to monitor for progression.    Dr. Emily Filbert is Medical Director for [place] and [facility].

## 2020-02-26 ENCOUNTER — Other Ambulatory Visit: Payer: Self-pay

## 2020-02-26 ENCOUNTER — Encounter: Payer: Medicare Other | Admitting: *Deleted

## 2020-02-26 DIAGNOSIS — Z87891 Personal history of nicotine dependence: Secondary | ICD-10-CM | POA: Diagnosis not present

## 2020-02-26 DIAGNOSIS — I5022 Chronic systolic (congestive) heart failure: Secondary | ICD-10-CM

## 2020-02-26 DIAGNOSIS — E119 Type 2 diabetes mellitus without complications: Secondary | ICD-10-CM | POA: Diagnosis not present

## 2020-02-26 DIAGNOSIS — I110 Hypertensive heart disease with heart failure: Secondary | ICD-10-CM | POA: Diagnosis not present

## 2020-02-26 NOTE — Progress Notes (Signed)
Daily Session Note  Patient Details  Name: Tim Mccullough MRN: [id number] Date of Birth: 12-26-1950 Referring Provider:     Cardiac Rehab from 01/08/2020 in [facility] Cardiac and Pulmonary Rehab  Referring Provider  [name] MD      Encounter Date: 02/26/2020  Check In: Session Check In - 02/26/20 1320      Check-In   Supervising physician immediately available to respond to emergencies  See telemetry face sheet for immediately available ER MD    Location  [facility]-Cardiac & Pulmonary Rehab    Staff Present  Heath Lark, RN, BSN, CCRP;Melissa [place] RDN, LDN;Joseph [employer], [place], ACSM CEP, Exercise Physiologist    Virtual Visit  No    Medication changes reported      No    Fall or balance concerns reported     No    Warm-up and Cool-down  Performed on first and last piece of equipment    Resistance Training Performed  Yes    VAD Patient?  No    PAD/SET Patient?  No      Pain Assessment   Currently in Pain?  No/denies          Social History   Tobacco Use  Smoking Status Former Smoker  . Years: 20.00  . Types: Cigarettes  . Quit date: 11/09/1998  . Years since quitting: 21.3  Smokeless Tobacco Former Systems developer  . Quit date: 06/03/2007  Tobacco Comment   no alcohol since nov 12    Goals Met:  Independence with exercise equipment Exercise tolerated well No report of cardiac concerns or symptoms  Goals Unmet:  Not Applicable  Comments: Pt able to follow exercise prescription today without complaint.  Will continue to monitor for progression.    Dr. Emily Filbert is Medical Director for [place] and [facility].

## 2020-02-28 ENCOUNTER — Other Ambulatory Visit: Payer: Self-pay

## 2020-02-28 ENCOUNTER — Encounter: Payer: Self-pay | Admitting: *Deleted

## 2020-02-28 ENCOUNTER — Encounter: Payer: Medicare Other | Admitting: *Deleted

## 2020-02-28 DIAGNOSIS — I5022 Chronic systolic (congestive) heart failure: Secondary | ICD-10-CM

## 2020-02-28 DIAGNOSIS — E119 Type 2 diabetes mellitus without complications: Secondary | ICD-10-CM | POA: Diagnosis not present

## 2020-02-28 DIAGNOSIS — Z87891 Personal history of nicotine dependence: Secondary | ICD-10-CM | POA: Diagnosis not present

## 2020-02-28 DIAGNOSIS — I110 Hypertensive heart disease with heart failure: Secondary | ICD-10-CM | POA: Diagnosis not present

## 2020-02-28 NOTE — Progress Notes (Signed)
Cardiac Individual Treatment Plan  Patient Details  Name: Tim Mccullough MRN: [id number] Date of Birth: 03/19/1951 Referring Provider:     Cardiac Rehab from 01/08/2020 in [facility] Cardiac and Pulmonary Rehab  Referring Provider  [name] MD      Initial Encounter Date:    Cardiac Rehab from 01/08/2020 in [facility] Cardiac and Pulmonary Rehab  Date  01/08/20      Visit Diagnosis: Heart failure, chronic systolic ([place])  Patient's Home Medications on Admission:  Current Outpatient Medications:  .  aspirin 81 MG tablet, Take 81 mg by mouth daily. , Disp: , Rfl:  .  atorvastatin (LIPITOR) 40 MG tablet, TAKE 1 TABLET BY MOUTH EVERY DAY (Patient taking differently: Take 40 mg by mouth daily. ), Disp: 90 tablet, Rfl: 3 .  doxazosin (CARDURA) 1 MG tablet, 1 tablet daily, Disp: 90 tablet, Rfl: 3 .  fish oil-omega-3 fatty acids 1000 MG capsule, Take 1 g by mouth 2 (two) times daily. , Disp: , Rfl:  .  losartan (COZAAR) 25 MG tablet, Take 1 tablet (25 mg) by mouth once daily at bedtime, Disp: , Rfl:  .  magnesium oxide (MAG-OX) 400 (241.3 Mg) MG tablet, Take 1 tablet (400 mg total) by mouth daily., Disp: 30 tablet, Rfl: 0 .  metFORMIN (GLUCOPHAGE) 500 MG tablet, TAKE 2 TABLETS (1,000 MG TOTAL) BY MOUTH 2 (TWO) TIMES DAILY WITH A MEAL., Disp: 360 tablet, Rfl: 3 .  metoprolol succinate (TOPROL-XL) 100 MG 24 hr tablet, Take 0.5 tablet (50 mg) by mouth twice daily. Take with or immediately following a meal., Disp: , Rfl:  .  omeprazole (PRILOSEC) 20 MG capsule, Take 1 capsule (20 mg total) by mouth daily., Disp: 30 capsule, Rfl: 11 .  ONE TOUCH ULTRA TEST test strip, CHECK BLOOD SUGAR DAILY AND IF BLOOD SUGAR FLUCTUATES. 250.00, Disp: 100 each, Rfl: 11 .  PARoxetine (PAXIL) 20 MG tablet, Take 1 tablet (20 mg total) by mouth daily., Disp: 90 tablet, Rfl: 3 .  spironolactone (ALDACTONE) 25 MG tablet, Take 0.5 tablet (12.5 mg) by mouth once daily at bedtime, Disp: , Rfl:  .  triamcinolone ointment (KENALOG)  0.1 %, Apply 1 application topically 2 (two) times daily as needed. , Disp: , Rfl:   Past Medical History: Past Medical History:  Diagnosis Date  . Abnormal echocardiogram 2/11   Repeated after med mgmt of cardiomyopathy showed EF 40-45%, mildly dilated LV, mild LV hypertrophy, anterolateral and apical hypokinesis, mild diastolic dysfunction.  . Anemia    hx  . Arthritis   . Blood transfusion without reported diagnosis 09/2012  . BPH (benign prostatic hypertrophy) 2004  . Cancer ([place])    melanoma back  . Cardiomyopathy    ? tachycardia-mediated  . Depression   . Diabetes mellitus 11/2006   Type II  . Diverticulosis   . Echocardiogram abnormal 7/10   Mild LVH with a mildly dilated LV.  EF was difficult to assess given poor acoustic windows but appeared moderately decreased.  Mild MR.  Probable small perimembranous VSD, mild LAE  . Esophageal erosions   . GERD (gastroesophageal reflux disease)   . Hip pain   . History of MRI 7/10   Cardiac MRI was done to followup difficult echo.  This showed mild to moderately dilated LV, mild to moderate LAE, EF  33% with global hypokinesis, normal RV size with mild systolic dysfunction.  There was no large VSD evident.  There was no myocardial delayed enhancement so no evidence for infiltrative  disease or infarction.  LHC (7/10) showed only luminal irreg. in the coronaries and EF 35%  . Holter monitor, abnormal 7/10   Frequent PAC's and PVC's  Average HR 96  (but he had started Toprol XL).  HR range 71-130  . Hyperlipidemia 04/1998  . Hypertension 1995  . Obesity   . OSA (obstructive sleep apnea)    Not using CPAP- improved with weight loss  . Pneumonia    HX OF pna  . Sinus tachycardia    Pt says he has been told his heart rate is high since he was in high school.   . Sleep apnea    has cpap; but does not use  . VSD (ventricular septal defect)    Probable small perimembranous    Tobacco Use: Social History   Tobacco Use  Smoking  Status Former Smoker  . Years: 20.00  . Types: Cigarettes  . Quit date: 11/09/1998  . Years since quitting: 21.3  Smokeless Tobacco Former Systems developer  . Quit date: 06/03/2007  Tobacco Comment   no alcohol since nov 12    Labs: Recent Review Flowsheet Data    Labs for ITP Cardiac and Pulmonary Rehab Latest Ref Rng & Units 01/20/2019 04/26/2019 08/21/2019 11/28/2019 01/29/2020   Cholestrol 0 - 200 mg/dL - 246(H) 142 - -   LDLCALC 0 - 99 mg/dL - 169(H) 76 - -   LDLDIRECT mg/dL - - - - -   HDL >39.00 mg/dL - 43.40 38.70(L) - -   Trlycerides 0.0 - 149.0 mg/dL - 172.0(H) 137.0 - -   Hemoglobin A1c 4.8 - 5.6 % 6.9(A) 7.0(H) 7.0(H) 6.8(H) 6.7(H)   PHART 7.350 - 7.450 - - - - -   PCO2ART 35.0 - 45.0 mmHg - - - - -   HCO3 20.0 - 24.0 mEq/L - - - - -   TCO2 0 - 100 mmol/L - - - - -   O2SAT % - - - - -       Exercise Target Goals: Exercise Program Goal: Individual exercise prescription set using results from initial 6 min walk test and THRR while considering  patient's activity barriers and safety.   Exercise Prescription Goal: Initial exercise prescription builds to 30-45 minutes a day of aerobic activity, 2-3 days per week.  Home exercise guidelines will be given to patient during program as part of exercise prescription that the participant will acknowledge.   Education: Aerobic Exercise & Resistance Training: - Gives group verbal and written instruction on the various components of exercise. Focuses on aerobic and resistive training programs and the benefits of this training and how to safely progress through these programs..   Education: Exercise & Equipment Safety: - Individual verbal instruction and demonstration of equipment use and safety with use of the equipment.   Cardiac Rehab from 01/08/2020 in [facility] Cardiac and Pulmonary Rehab  Date  01/08/20  Educator  [facility]  Instruction Review Code  1- Verbalizes Understanding      Education: Exercise Physiology & General Exercise Guidelines: -  Group verbal and written instruction with models to review the exercise physiology of the cardiovascular system and associated critical values. Provides general exercise guidelines with specific guidelines to those with heart or lung disease.    Education: Flexibility, Balance, Mind/Body Relaxation: Provides group verbal/written instruction on the benefits of flexibility and balance training, including mind/body exercise modes such as yoga, pilates and tai chi.  Demonstration and skill practice provided.   Activity Barriers & Risk Stratification: Activity  Barriers & Cardiac Risk Stratification - 01/08/20 1102      Activity Barriers & Cardiac Risk Stratification   Activity Barriers  Joint Problems;Back Problems;Left Hip Replacement;Right Knee Replacement;Deconditioning;Muscular Weakness    Cardiac Risk Stratification  High       6 Minute Walk: 6 Minute Walk    Row Name 01/08/20 1101         6 Minute Walk   Phase  Initial     Distance  1163 feet     Walk Time  6 minutes     # of Rest Breaks  0     MPH  2.2     METS  2.71     RPE  11     VO2 Peak  9.49     Symptoms  Yes (comment)     Comments  Left hip starting to be bothersome at end 6/10     Resting HR  91 bpm     Resting BP  126/70     Resting Oxygen Saturation   96 %     Exercise Oxygen Saturation  during 6 min walk  93 %     Max Ex. HR  123 bpm     Max Ex. BP  142/70     2 Minute Post BP  130/60        Oxygen Initial Assessment:   Oxygen Re-Evaluation:   Oxygen Discharge (Final Oxygen Re-Evaluation):   Initial Exercise Prescription: Initial Exercise Prescription - 01/08/20 1100      Date of Initial Exercise RX and Referring Provider   Date  01/08/20    Referring Provider  [name] MD      Treadmill   MPH  2.2    Grade  0    Minutes  15    METs  2.68      Recumbant Elliptical   Level  1    RPM  50    Minutes  15    METs  2.7      Elliptical   Level  1    Speed  2.5    Minutes  15        REL-XR   Level  2    Speed  50    Minutes  15    METs  2.7      Prescription Details   Frequency (times per week)  3    Duration  Progress to 30 minutes of continuous aerobic without signs/symptoms of physical distress      Intensity   THRR 40-80% of Max Heartrate  115-139    Ratings of Perceived Exertion  11-13    Perceived Dyspnea  0-4      Progression   Progression  Continue to progress workloads to maintain intensity without signs/symptoms of physical distress.      Resistance Training   Training Prescription  Yes    Weight  4 lb       Perform Capillary Blood Glucose checks as needed.  Exercise Prescription Changes: Exercise Prescription Changes    Row Name 01/08/20 1100 01/24/20 1300 02/07/20 1500 02/22/20 1200       Response to Exercise   Blood Pressure (Admit)  126/70  118/68  134/70  104/56    Blood Pressure (Exercise)  142/74  128/74  134/64  128/60    Blood Pressure (Exit)  130/60  124/70  124/64  118/60    Heart Rate (Admit)  97 bpm  94 bpm  101  bpm  90 bpm    Heart Rate (Exercise)  123 bpm  105 bpm  121 bpm  133 bpm    Heart Rate (Exit)  94 bpm  97 bpm  102 bpm  101 bpm    Oxygen Saturation (Admit)  96 %  --  --  --    Oxygen Saturation (Exercise)  93 %  --  --  --    Rating of Perceived Exertion (Exercise)  '11  13  13  13    ' Perceived Dyspnea (Exercise)  0  --  --  --    Symptoms  hip bothersome at end 6/10  none  none  none    Comments  walk test results  --  --  --    Duration  --  Continue with 30 min of aerobic exercise without signs/symptoms of physical distress.  Continue with 30 min of aerobic exercise without signs/symptoms of physical distress.  Continue with 30 min of aerobic exercise without signs/symptoms of physical distress.    Intensity  --  THRR unchanged  THRR unchanged  THRR unchanged      Progression   Progression  --  Continue to progress workloads to maintain intensity without signs/symptoms of physical distress.  Continue to  progress workloads to maintain intensity without signs/symptoms of physical distress.  Continue to progress workloads to maintain intensity without signs/symptoms of physical distress.    Average METs  --  2.77  3.06  3.3      Resistance Training   Training Prescription  --  Yes  Yes  Yes    Weight  --  4 lb  4 lb  4 lb    Reps  --  10-15  10-15  10-15      Interval Training   Interval Training  --  No  No  No      Treadmill   MPH  --  2.2  2.2  2.2    Grade  --  0  0  0    Minutes  --  '15  15  15    ' METs  --  2.68  2.68  2.68      Recumbant Bike   Level  --  4  2.5  4    Minutes  --  '15  15  15    ' METs  --  3.7  3.6  4.4      NuStep   Level  --  '6  6  6    ' Minutes  --  '15  15  15    ' METs  --  2.5  2.9  3.3      Recumbant Elliptical   Level  --  2  --  --    Minutes  --  15  --  --    METs  --  2.2  --  --      REL-XR   Level  --  --  1  6    Speed  --  --  3  --    Minutes  --  --  15  15    METs  --  --  --  2.8      Home Exercise Plan   Plans to continue exercise at  --  Home (comment) walking  Home (comment) walking  Home (comment) walking    Frequency  --  Add 2 additional days to program exercise sessions.  Add 2 additional days to  program exercise sessions.  Add 2 additional days to program exercise sessions.    Initial Home Exercises Provided  --  01/12/20  01/12/20  01/12/20       Exercise Comments: Exercise Comments    Row Name 01/10/20 1323           Exercise Comments  First full day of exercise!  Patient was oriented to gym and equipment including functions, settings, policies, and procedures.  Patient's individual exercise prescription and treatment plan were reviewed.  All starting workloads were established based on the results of the 6 minute walk test done at initial orientation visit.  The plan for exercise progression was also introduced and progression will be customized based on patient's performance and goals.          Exercise Goals and  Review: Exercise Goals    Row Name 01/08/20 1255             Exercise Goals   Increase Physical Activity  Yes       Intervention  Provide advice, education, support and counseling about physical activity/exercise needs.;Develop an individualized exercise prescription for aerobic and resistive training based on initial evaluation findings, risk stratification, comorbidities and participant's personal goals.       Expected Outcomes  Short Term: Attend rehab on a regular basis to increase amount of physical activity.;Long Term: Add in home exercise to make exercise part of routine and to increase amount of physical activity.;Long Term: Exercising regularly at least 3-5 days a week.       Increase Strength and Stamina  Yes       Intervention  Provide advice, education, support and counseling about physical activity/exercise needs.;Develop an individualized exercise prescription for aerobic and resistive training based on initial evaluation findings, risk stratification, comorbidities and participant's personal goals.       Expected Outcomes  Short Term: Increase workloads from initial exercise prescription for resistance, speed, and METs.;Short Term: Perform resistance training exercises routinely during rehab and add in resistance training at home;Long Term: Improve cardiorespiratory fitness, muscular endurance and strength as measured by increased METs and functional capacity (6MWT)       Able to understand and use rate of perceived exertion (RPE) scale  Yes       Intervention  Provide education and explanation on how to use RPE scale       Expected Outcomes  Short Term: Able to use RPE daily in rehab to express subjective intensity level;Long Term:  Able to use RPE to guide intensity level when exercising independently       Able to understand and use Dyspnea scale  Yes       Intervention  Provide education and explanation on how to use Dyspnea scale       Expected Outcomes  Short Term: Able to  use Dyspnea scale daily in rehab to express subjective sense of shortness of breath during exertion;Long Term: Able to use Dyspnea scale to guide intensity level when exercising independently       Knowledge and understanding of Target Heart Rate Range (THRR)  Yes       Intervention  Provide education and explanation of THRR including how the numbers were predicted and where they are located for reference       Expected Outcomes  Short Term: Able to use daily as guideline for intensity in rehab;Short Term: Able to state/look up THRR;Long Term: Able to use THRR to govern intensity when exercising independently  Able to check pulse independently  Yes       Intervention  Provide education and demonstration on how to check pulse in carotid and radial arteries.;Review the importance of being able to check your own pulse for safety during independent exercise       Expected Outcomes  Short Term: Able to explain why pulse checking is important during independent exercise;Long Term: Able to check pulse independently and accurately       Understanding of Exercise Prescription  Yes       Intervention  Provide education, explanation, and written materials on patient's individual exercise prescription       Expected Outcomes  Short Term: Able to explain program exercise prescription;Long Term: Able to explain home exercise prescription to exercise independently          Exercise Goals Re-Evaluation : Exercise Goals Re-Evaluation    Row Name 01/10/20 1323 01/18/20 1337 01/24/20 1326 02/05/20 1311 02/22/20 1212     Exercise Goal Re-Evaluation   Exercise Goals Review  Able to understand and use rate of perceived exertion (RPE) scale;Knowledge and understanding of Target Heart Rate Range (THRR);Understanding of Exercise Prescription  Increase Physical Activity;Increase Strength and Stamina;Understanding of Exercise Prescription  Increase Physical Activity;Increase Strength and Stamina;Understanding of  Exercise Prescription  Increase Physical Activity;Increase Strength and Stamina;Understanding of Exercise Prescription  Increase Physical Activity;Increase Strength and Stamina;Understanding of Exercise Prescription   Comments  Reviewed RPE scale, THR and program prescription with pt today.  Pt voiced understanding and was given a copy of goals to take home.  [name] is off to a good start in rehab.  He is walking in the drive way now and plans to start gardening some more.  Reviewed home exercise with pt today.  Pt plans to walk at home for exercise.  Reviewed THR, pulse, RPE, sign and symptoms, NTG use, and when to call 911 or MD.  Also discussed weather considerations and indoor options.  Pt voiced understanding.  [name] is doing well in rehab.  He is up to level 6 on the NuStep. We will continue to monitor his progress.  [name] is doing well in rehab.  He usually is active at home and gets to exercise on his off days.  Other than his recently heart problems, his stamina is getting better.  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  [name] is doing well in rehab.  He is now on level 6 on the XR!  We will continue to monitor his progress.   Expected Outcomes  Short: Use RPE daily to regulate intensity. Long: Follow program prescription in THR.  Short: Start to add in more walking  Long: Continue to build stamina.  Short: Add incline on treadmill  Long: Continue to add in walking at home.  Short: Continue to monitor HR  Long: Continue to walk on off days.  Short: Try to add some incline on treadmill  Long: Continue to improve stamina.      Discharge Exercise Prescription (Final Exercise Prescription Changes): Exercise Prescription Changes - 02/22/20 1200      Response to Exercise   Blood Pressure (Admit)  104/56    Blood Pressure (Exercise)  128/60    Blood Pressure (Exit)  118/60    Heart Rate (Admit)  90 bpm    Heart Rate (Exercise)  133 bpm    Heart Rate (Exit)  101 bpm     Rating of Perceived Exertion (Exercise)  13  Symptoms  none    Duration  Continue with 30 min of aerobic exercise without signs/symptoms of physical distress.    Intensity  THRR unchanged      Progression   Progression  Continue to progress workloads to maintain intensity without signs/symptoms of physical distress.    Average METs  3.3      Resistance Training   Training Prescription  Yes    Weight  4 lb    Reps  10-15      Interval Training   Interval Training  No      Treadmill   MPH  2.2    Grade  0    Minutes  15    METs  2.68      Recumbant Bike   Level  4    Minutes  15    METs  4.4      NuStep   Level  6    Minutes  15    METs  3.3      REL-XR   Level  6    Minutes  15    METs  2.8      Home Exercise Plan   Plans to continue exercise at  Home (comment)   walking   Frequency  Add 2 additional days to program exercise sessions.    Initial Home Exercises Provided  01/12/20       Nutrition:  Target Goals: Understanding of nutrition guidelines, daily intake of sodium <1597m, cholesterol <2057m calories 30% from fat and 7% or less from saturated fats, daily to have 5 or more servings of fruits and vegetables.  Education: Controlling Sodium/Reading Food Labels -Group verbal and written material supporting the discussion of sodium use in heart healthy nutrition. Review and explanation with models, verbal and written materials for utilization of the food label.   Education: General Nutrition Guidelines/Fats and Fiber: -Group instruction provided by verbal, written material, models and posters to present the general guidelines for heart healthy nutrition. Gives an explanation and review of dietary fats and fiber.   Biometrics: Pre Biometrics - 01/08/20 1257      Pre Biometrics   Height  5' 10.4" (1.788 m)    Weight  241 lb 11.2 oz (109.6 kg)    BMI (Calculated)  34.29    Single Leg Stand  26.06 seconds        Nutrition Therapy Plan and Nutrition  Goals: Nutrition Therapy & Goals - 02/20/20 1300      Nutrition Therapy   Diet  HH, Low Na, DM    Drug/Food Interactions  Statins/Certain Fruits    Protein (specify units)  85-90g    Fiber  30 grams    Whole Grain Foods  3 servings    Saturated Fats  12 max. grams    Fruits and Vegetables  5 servings/day    Sodium  1.5 grams      Personal Nutrition Goals   Nutrition Goal  ST: continue with current changes LT: lose 30lbs (10 lbs so so far), Getting back into shape    Comments  B: couple boiled eggs with fruit and protein shake L or S: chips with salsa, broccoli with ranch dressing, or carrot chips D: chicken with cauliflower rice. BG: ~150 randomly, A1c ~6.7. Discussed HH and DM friendly eating, CHOs, honoring hunger, tips for more satiating and satisfying meals.      Intervention Plan   Intervention  Prescribe, educate and counsel regarding individualized specific dietary modifications aiming towards  targeted core components such as weight, hypertension, lipid management, diabetes, heart failure and other comorbidities.;Nutrition handout(s) given to patient.    Expected Outcomes  Short Term Goal: Understand basic principles of dietary content, such as calories, fat, sodium, cholesterol and nutrients.;Short Term Goal: A plan has been developed with personal nutrition goals set during dietitian appointment.;Long Term Goal: Adherence to prescribed nutrition plan.       Nutrition Assessments: Nutrition Assessments - 01/08/20 1259      MEDFICTS Scores   Pre Score  72       MEDIFICTS Score Key:          ?70 Need to make dietary changes          40-70 Heart Healthy Diet         ? 40 Therapeutic Level Cholesterol Diet  Nutrition Goals Re-Evaluation:   Nutrition Goals Discharge (Final Nutrition Goals Re-Evaluation):   Psychosocial: Target Goals: Acknowledge presence or absence of significant depression and/or stress, maximize coping skills, provide positive support system.  Participant is able to verbalize types and ability to use techniques and skills needed for reducing stress and depression.   Education: Depression - Provides group verbal and written instruction on the correlation between heart/lung disease and depressed mood, treatment options, and the stigmas associated with seeking treatment.   Education: Sleep Hygiene -Provides group verbal and written instruction about how sleep can affect your health.  Define sleep hygiene, discuss sleep cycles and impact of sleep habits. Review good sleep hygiene tips.     Education: Stress and Anxiety: - Provides group verbal and written instruction about the health risks of elevated stress and causes of high stress.  Discuss the correlation between heart/lung disease and anxiety and treatment options. Review healthy ways to manage with stress and anxiety.    Initial Review & Psychosocial Screening: Initial Psych Review & Screening - 01/04/20 1040      Initial Review   Current issues with  Current Stress Concerns    Source of Stress Concerns  Chronic Illness      Family Dynamics   Good Support System?  Yes   daughter, grandaughters     Barriers   Psychosocial barriers to participate in program  There are no identifiable barriers or psychosocial needs.;The patient should benefit from training in stress management and relaxation.      Screening Interventions   Interventions  Provide feedback about the scores to participant;Encouraged to exercise;To provide support and resources with identified psychosocial needs    Expected Outcomes  Short Term goal: Utilizing psychosocial counselor, staff and physician to assist with identification of specific Stressors or current issues interfering with healing process. Setting desired goal for each stressor or current issue identified.;Long Term Goal: Stressors or current issues are controlled or eliminated.;Short Term goal: Identification and review with participant of any  Quality of Life or Depression concerns found by scoring the questionnaire.;Long Term goal: The participant improves quality of Life and PHQ9 Scores as seen by post scores and/or verbalization of changes       Quality of Life Scores:  Quality of Life - 01/08/20 1258      Quality of Life   Select  Quality of Life      Quality of Life Scores   Health/Function Pre  17.03 %    Socioeconomic Pre  20.79 %    Psych/Spiritual Pre  20.71 %    Family Pre  27 %    GLOBAL Pre  19.82 %  Scores of 19 and below usually indicate a poorer quality of life in these areas.  A difference of  2-3 points is a clinically meaningful difference.  A difference of 2-3 points in the total score of the Quality of Life Index has been associated with significant improvement in overall quality of life, self-image, physical symptoms, and general health in studies assessing change in quality of life.  PHQ-9: Recent Review Flowsheet Data    Depression screen [place] [facility] 2/9 02/05/2020 01/08/2020 04/26/2019 04/08/2018 08/20/2015   Decreased Interest 2  1 0 0 0   Down, Depressed, Hopeless 0 0 0 0 0   PHQ - 2 Score 2 1 0 0 0   Altered sleeping 1 1 0 0 -   Tired, decreased energy 3 2 0 0 -   Change in appetite 0 1 0 0 -   Feeling bad or failure about yourself  0 0 0 0 -   Trouble concentrating 0 0 0 0 -   Moving slowly or fidgety/restless 0 0 0 0 -   Suicidal thoughts 0 0 0 0 -   PHQ-9 Score 6 5 0 0 -   Difficult doing work/chores Somewhat difficult Somewhat difficult Not difficult at all Not difficult at all -     Interpretation of Total Score  Total Score Depression Severity:  1-4 = Minimal depression, 5-9 = Mild depression, 10-14 = Moderate depression, 15-19 = Moderately severe depression, 20-27 = Severe depression   Psychosocial Evaluation and Intervention: Psychosocial Evaluation - 01/04/20 1044      Psychosocial Evaluation & Interventions   Comments  Jazmine reports doing well. His daughter and her two daughters live  with him. The issue causing him the most stress right now is the cost of Entresto. He knows this medicine will help him a lot, it is just hard coming up with the additional $400-$500 a month. He is working with his doctor's office and pharmacy to find a plan that is more affordable. This issue currently has caused some sleep issues, but he knows it will all work out in the end. He is looking forward to coming to cardiac rehab because he really wants to work hard to feel better and have the best quality of life.    Expected Outcomes  Short: attend [facility] for exercise and education. Long: develope positive self care habits    Continue Psychosocial Services   Follow up required by staff       Psychosocial Re-Evaluation: Psychosocial Re-Evaluation    Bend Name 01/18/20 1338 02/05/20 1308           Psychosocial Re-Evaluation   Current issues with  Current Stress Concerns  Current Stress Concerns;Current Depression      Comments  [name] is doing well mentally and in a good place.  He has really enjoyed coming to class and feeling better overall.  He forgot to call court about his jury duty miss.  He just has a lot on his mind.  [name] is doing well mentally.  He got a little scared last week after a bout of afib.  It got him not feeling well either.  It has increased his PHQ by a point, but feels good otherwise.      Expected Outcomes  Short: Continue to attend rehab.  Long: Continue to stay positive.  Short: Get heart back in rhythm.  Long: Continue to stay positive.      Interventions  Encouraged to attend Cardiac Rehabilitation for the exercise  Encouraged to attend Cardiac Rehabilitation for the exercise      Continue Psychosocial Services   Follow up required by staff  Follow up required by staff         Psychosocial Discharge (Final Psychosocial Re-Evaluation): Psychosocial Re-Evaluation - 02/05/20 1308      Psychosocial Re-Evaluation   Current issues with  Current Stress  Concerns;Current Depression    Comments  [name] is doing well mentally.  He got a little scared last week after a bout of afib.  It got him not feeling well either.  It has increased his PHQ by a point, but feels good otherwise.    Expected Outcomes  Short: Get heart back in rhythm.  Long: Continue to stay positive.    Interventions  Encouraged to attend Cardiac Rehabilitation for the exercise    Continue Psychosocial Services   Follow up required by staff       Vocational Rehabilitation: Provide vocational rehab assistance to qualifying candidates.   Vocational Rehab Evaluation & Intervention: Vocational Rehab - 01/04/20 1037      Initial Vocational Rehab Evaluation & Intervention   Assessment shows need for Vocational Rehabilitation  No       Education: Education Goals: Education classes will be provided on a variety of topics geared toward better understanding of heart health and risk factor modification. Participant will state understanding/return demonstration of topics presented as noted by education test scores.  Learning Barriers/Preferences: Learning Barriers/Preferences - 01/04/20 1036      Learning Barriers/Preferences   Learning Barriers  None    Learning Preferences  None       General Cardiac Education Topics:  AED/CPR: - Group verbal and written instruction with the use of models to demonstrate the basic use of the AED with the basic ABC's of resuscitation.   Anatomy & Physiology of the Heart: - Group verbal and written instruction and models provide basic cardiac anatomy and physiology, with the coronary electrical and arterial systems. Review of Valvular disease and Heart Failure   Cardiac Procedures: - Group verbal and written instruction to review commonly prescribed medications for heart disease. Reviews the medication, class of the drug, and side effects. Includes the steps to properly store meds and maintain the prescription regimen. (beta blockers and  nitrates)   Cardiac Medications I: - Group verbal and written instruction to review commonly prescribed medications for heart disease. Reviews the medication, class of the drug, and side effects. Includes the steps to properly store meds and maintain the prescription regimen.   Cardiac Medications II: -Group verbal and written instruction to review commonly prescribed medications for heart disease. Reviews the medication, class of the drug, and side effects. (all other drug classes)    Go Sex-Intimacy & Heart Disease, Get SMART - Goal Setting: - Group verbal and written instruction through game format to discuss heart disease and the return to sexual intimacy. Provides group verbal and written material to discuss and apply goal setting through the application of the S.M.A.R.T. Method.   Other Matters of the Heart: - Provides group verbal, written materials and models to describe Stable Angina and Peripheral Artery. Includes description of the disease process and treatment options available to the cardiac patient.   Infection Prevention: - Provides verbal and written material to individual with discussion of infection control including proper hand washing and proper equipment cleaning during exercise session.   Cardiac Rehab from 01/08/2020 in [facility] Cardiac and Pulmonary Rehab  Date  01/08/20  Educator  [facility]  Instruction Review Code  1- Verbalizes Understanding      Falls Prevention: - Provides verbal and written material to individual with discussion of falls prevention and safety.   Cardiac Rehab from 01/08/2020 in [facility] Cardiac and Pulmonary Rehab  Date  01/08/20  Educator  [facility]  Instruction Review Code  1- Verbalizes Understanding      Other: -Provides group and verbal instruction on various topics (see comments)   Knowledge Questionnaire Score: Knowledge Questionnaire Score - 01/08/20 1259      Knowledge Questionnaire Score   Pre Score  20/26 Education Focus: Depression,  Angina, Nutrition, Exercise       Core Components/Risk Factors/Patient Goals at Admission: Personal Goals and Risk Factors at Admission - 01/08/20 1259      Core Components/Risk Factors/Patient Goals on Admission    Weight Management  Yes;Obesity;Weight Loss    Intervention  Weight Management: Develop a combined nutrition and exercise program designed to reach desired caloric intake, while maintaining appropriate intake of nutrient and fiber, sodium and fats, and appropriate energy expenditure required for the weight goal.;Weight Management: Provide education and appropriate resources to help participant work on and attain dietary goals.;Weight Management/Obesity: Establish reasonable short term and long term weight goals.;Obesity: Provide education and appropriate resources to help participant work on and attain dietary goals.    Admit Weight  241 lb 11.2 oz (109.6 kg)    Goal Weight: Short Term  235 lb (106.6 kg)    Goal Weight: Long Term  230 lb (104.3 kg)    Expected Outcomes  Short Term: Continue to assess and modify interventions until short term weight is achieved;Long Term: Adherence to nutrition and physical activity/exercise program aimed toward attainment of established weight goal;Weight Loss: Understanding of general recommendations for a balanced deficit meal plan, which promotes 1-2 lb weight loss per week and includes a negative energy balance of [phone] kcal/d;Understanding recommendations for meals to include 15-35% energy as protein, 25-35% energy from fat, 35-60% energy from carbohydrates, less than 29m of dietary cholesterol, 20-35 gm of total fiber daily;Understanding of distribution of calorie intake throughout the day with the consumption of 4-5 meals/snacks    Diabetes  Yes    Intervention  Provide education about signs/symptoms and action to take for hypo/hyperglycemia.;Provide education about proper nutrition, including hydration, and aerobic/resistive exercise  prescription along with prescribed medications to achieve blood glucose in normal ranges: Fasting glucose 65-99 mg/dL    Expected Outcomes  Short Term: Participant verbalizes understanding of the signs/symptoms and immediate care of hyper/hypoglycemia, proper foot care and importance of medication, aerobic/resistive exercise and nutrition plan for blood glucose control.;Long Term: Attainment of HbA1C < 7%.    Lipids  Yes    Intervention  Provide education and support for participant on nutrition & aerobic/resistive exercise along with prescribed medications to achieve LDL <761m HDL >4010m   Expected Outcomes  Short Term: Participant states understanding of desired cholesterol values and is compliant with medications prescribed. Participant is following exercise prescription and nutrition guidelines.;Long Term: Cholesterol controlled with medications as prescribed, with individualized exercise RX and with personalized nutrition plan. Value goals: LDL < 97m97mDL > 40 mg.       Education:Diabetes - Individual verbal and written instruction to review signs/symptoms of diabetes, desired ranges of glucose level fasting, after meals and with exercise. Acknowledge that pre and post exercise glucose checks will be done for 3 sessions at entry of program.   [facility]  Date  01/04/20  Educator  [place]  Instruction Review Code  1- [employer] Understanding      Education: Know Your Numbers and Risk Factors: -Group verbal and written instruction about important numbers in your health.  Discussion of what are risk factors and how they play a role in the disease process.  Review of Cholesterol, Blood Pressure, Diabetes, and BMI and the role they play in your overall health.   Core Components/Risk Factors/Patient Goals Review:  Goals and Risk Factor Review    Row Name 01/18/20 1340 02/05/20 1310           Core Components/Risk Factors/Patient Goals  Review   Personal Goals Review  Weight Management/Obesity;Hypertension  Weight Management/Obesity;Hypertension      Review  [name] is doing well with his weight and coming down some each day.  Blood pressures are doing well and he checks them at home.  Sugars have also been good.  [name] is doing well in rehab. Weight is down some.  Blood pressures are good.  On new med reginme to manage new onset afib.      Expected Outcomes  Short: Continue to work on weight loss  Long: Continue to monitor risk factors  Short: Continue to work on weight loss  Long: Continue to monitor risk factors         Core Components/Risk Factors/Patient Goals at Discharge (Final Review):  Goals and Risk Factor Review - 02/05/20 1310      Core Components/Risk Factors/Patient Goals Review   Personal Goals Review  Weight Management/Obesity;Hypertension    Review  [name] is doing well in rehab. Weight is down some.  Blood pressures are good.  On new med reginme to manage new onset afib.    Expected Outcomes  Short: Continue to work on weight loss  Long: Continue to monitor risk factors       ITP Comments: ITP Comments    Row Name 01/04/20 1048 01/08/20 1100 01/10/20 1323 01/31/20 1032 02/20/20 1314   ITP Comments  Initial orientation phone call completed. Diagnosis can be found in [facility] 1/29. EP orientation scheduled for 3/1 at 9:30  Completed 6MWT and gym orientation.  Initial ITP created and sent for review to Dr. Emily Filbert, Medical Director.  First full day of exercise!  Patient was oriented to gym and equipment including functions, settings, policies, and procedures.  Patient's individual exercise prescription and treatment plan were reviewed.  All starting workloads were established based on the results of the 6 minute walk test done at initial orientation visit.  The plan for exercise progression was also introduced and progression will be customized based on patient's performance and goals.  30 day chart review  completed. ITP sent to Dr Zachery Dakins Medical Director, for review,changes as needed and signature. New to program  Completed Initial RD Eval   Row Name 02/28/20 0639           ITP Comments  30 Day review completed. Medical Director review done, changes made as directed,and approval shown by signature of Market researcher.          Comments:

## 2020-02-28 NOTE — Progress Notes (Signed)
Daily Session Note  Patient Details  Name: Tim Mccullough MRN: [id number] Date of Birth: [birth year] Referring Provider:     Cardiac Rehab from 01/08/2020 in [facility] Cardiac and Pulmonary Rehab  Referring Provider  [name] MD      Encounter Date: 02/28/2020  Check In: Session Check In - 02/28/20 1326      Check-In   Supervising physician immediately available to respond to emergencies  See telemetry face sheet for immediately available ER MD    Location  [facility]-Cardiac & Pulmonary Rehab    Staff Present  Heath Lark, RN, BSN, CCRP;Joseph Hood RCP,RRT,BSRT;Melissa E[place]ula RDN, LDN;Jessica Franklin, [place], RCEP, CCRP, CCET    Virtual Visit  No    Medication changes reported      No    Fall or balance concerns reported     No    Warm-up and Cool-down  Performed on first and last piece of equipment    Resistance Training Performed  Yes    VAD Patient?  No    PAD/SET Patient?  No      Pain Assessment   Currently in Pain?  No/denies          Social History   Tobacco Use  Smoking Status Former Smoker  . Years: 20.00  . Types: Cigarettes  . Quit date: 11/09/1998  . Years since quitting: 21.3  Smokeless Tobacco Former Systems developer  . Quit date: 06/03/2007  Tobacco Comment   no alcohol since nov 12    Goals Met:  Independence with exercise equipment Exercise tolerated well No report of cardiac concerns or symptoms  Goals Unmet:  Not Applicable  Comments: Pt able to follow exercise prescription today without complaint.  Will continue to monitor for progression.    Dr. Emily Filbert is Medical Director for [facility].

## 2020-02-29 ENCOUNTER — Encounter: Payer: Medicare Other | Admitting: *Deleted

## 2020-02-29 ENCOUNTER — Other Ambulatory Visit: Payer: Self-pay

## 2020-02-29 DIAGNOSIS — I110 Hypertensive heart disease with heart failure: Secondary | ICD-10-CM | POA: Diagnosis not present

## 2020-02-29 DIAGNOSIS — I5022 Chronic systolic (congestive) heart failure: Secondary | ICD-10-CM

## 2020-02-29 DIAGNOSIS — Z87891 Personal history of nicotine dependence: Secondary | ICD-10-CM | POA: Diagnosis not present

## 2020-02-29 DIAGNOSIS — E119 Type 2 diabetes mellitus without complications: Secondary | ICD-10-CM | POA: Diagnosis not present

## 2020-02-29 NOTE — Progress Notes (Signed)
Daily Session Note  Patient Details  Name: Tim Mccullough MRN: [id number] Date of Birth: 1951/07/30 Referring Provider:     Cardiac Rehab from 01/08/2020 in [facility] Cardiac and Pulmonary Rehab  Referring Provider  [name] MD      Encounter Date: 02/29/2020  Check In: Session Check In - 02/29/20 1304      Check-In   Supervising physician immediately available to respond to emergencies  See telemetry face sheet for immediately available ER MD    Location  [facility]-Cardiac & Pulmonary Rehab    Staff Present  Heath Lark, RN, BSN, CCRP;Meredith Sherryll Burger, RN BSN;Joseph Foy [place], [place], ACSM CEP, Exercise Physiologist    Virtual Visit  No    Medication changes reported      No    Fall or balance concerns reported     No    Warm-up and Cool-down  Performed on first and last piece of equipment    Resistance Training Performed  Yes    VAD Patient?  No    PAD/SET Patient?  No      Pain Assessment   Currently in Pain?  No/denies          Social History   Tobacco Use  Smoking Status Former Smoker  . Years: 20.00  . Types: Cigarettes  . Quit date: 11/09/1998  . Years since quitting: 21.3  Smokeless Tobacco Former Systems developer  . Quit date: 06/03/2007  Tobacco Comment   no alcohol since nov 12    Goals Met:  Independence with exercise equipment Exercise tolerated well No report of cardiac concerns or symptoms  Goals Unmet:  Not Applicable  Comments: Pt able to follow exercise prescription today without complaint.  Will continue to monitor for progression.    Dr. Emily Filbert is Medical Director for [facility].

## 2020-03-01 ENCOUNTER — Other Ambulatory Visit: Payer: Self-pay

## 2020-03-01 ENCOUNTER — Ambulatory Visit (INDEPENDENT_AMBULATORY_CARE_PROVIDER_SITE_OTHER): Payer: Medicare Other | Admitting: Internal Medicine

## 2020-03-01 ENCOUNTER — Encounter: Payer: Self-pay | Admitting: Internal Medicine

## 2020-03-01 DIAGNOSIS — I472 Ventricular tachycardia: Secondary | ICD-10-CM | POA: Diagnosis not present

## 2020-03-01 DIAGNOSIS — R000 Tachycardia, unspecified: Secondary | ICD-10-CM | POA: Insufficient documentation

## 2020-03-01 NOTE — Progress Notes (Signed)
HPI [name] is referred today by Dr. Caryl Comes to consider catheter ablation of SVT as well as PVC's. He has a non-ischemic CM with an EF of 35%. He had been tried on flecainide but this was stoppedafter presenting with a wide QRS tachycardia. He does not have much in the way palpitations. When he presented with a wide QRS tachy last month he felt dizzy but not much in the way of palpitations. His flecainide was stopped. He continues to have PVC's.  No Known Allergies   Current Outpatient Medications  Medication Sig Dispense Refill  . aspirin 81 MG tablet Take 81 mg by mouth daily.     Marland Kitchen atorvastatin (LIPITOR) 40 MG tablet TAKE 1 TABLET BY MOUTH EVERY DAY 90 tablet 3  . doxazosin (CARDURA) 1 MG tablet 1 tablet daily 90 tablet 3  . fish oil-omega-3 fatty acids 1000 MG capsule Take 1 g by mouth 2 (two) times daily.     Marland Kitchen losartan (COZAAR) 25 MG tablet Take 1 tablet (25 mg) by mouth once daily at bedtime    . magnesium oxide (MAG-OX) 400 (241.3 Mg) MG tablet Take 1 tablet (400 mg total) by mouth daily. 30 tablet 0  . metFORMIN (GLUCOPHAGE) 500 MG tablet TAKE 2 TABLETS (1,000 MG TOTAL) BY MOUTH 2 (TWO) TIMES DAILY WITH A MEAL. 360 tablet 3  . metoprolol tartrate (LOPRESSOR) 25 MG tablet Take 37.5 mg by mouth 2 (two) times daily.    Marland Kitchen omeprazole (PRILOSEC) 20 MG capsule Take 1 capsule (20 mg total) by mouth daily. 30 capsule 11  . ONE TOUCH ULTRA TEST test strip CHECK BLOOD SUGAR DAILY AND IF BLOOD SUGAR FLUCTUATES. 250.00 100 each 11  . PARoxetine (PAXIL) 20 MG tablet Take 1 tablet (20 mg total) by mouth daily. 90 tablet 3  . spironolactone (ALDACTONE) 25 MG tablet Take 0.5 tablet (12.5 mg) by mouth once daily at bedtime    . triamcinolone ointment (KENALOG) 0.1 % Apply 1 application topically 2 (two) times daily as needed.      No current facility-administered medications for this visit.     Past Medical History:  Diagnosis Date  . Abnormal echocardiogram 2/11   Repeated after med  mgmt of cardiomyopathy showed EF 40-45%, mildly dilated LV, mild LV hypertrophy, anterolateral and apical hypokinesis, mild diastolic dysfunction.  . Anemia    hx  . Arthritis   . Blood transfusion without reported diagnosis 09/2012  . BPH (benign prostatic hypertrophy) 2004  . Cancer ([place])    melanoma back  . Cardiomyopathy    ? tachycardia-mediated  . Depression   . Diabetes mellitus 11/2006   Type II  . Diverticulosis   . Echocardiogram abnormal 7/10   Mild LVH with a mildly dilated LV.  EF was difficult to assess given poor acoustic windows but appeared moderately decreased.  Mild MR.  Probable small perimembranous VSD, mild LAE  . Esophageal erosions   . GERD (gastroesophageal reflux disease)   . Hip pain   . History of MRI 7/10   Cardiac MRI was done to followup difficult echo.  This showed mild to moderately dilated LV, mild to moderate LAE, EF  33% with global hypokinesis, normal RV size with mild systolic dysfunction.  There was no large VSD evident.  There was no myocardial delayed enhancement so no evidence for infiltrative disease or infarction.  LHC (7/10) showed only luminal irreg. in the coronaries and EF 35%  . Holter monitor, abnormal 7/10  Frequent PAC's and PVC's  Average HR 96  (but he had started Toprol XL).  HR range 71-130  . Hyperlipidemia 04/1998  . Hypertension 1995  . Obesity   . OSA (obstructive sleep apnea)    Not using CPAP- improved with weight loss  . Pneumonia    HX OF pna  . Sinus tachycardia    Pt says he has been told his heart rate is high since he was in high school.   . Sleep apnea    has cpap; but does not use  . VSD (ventricular septal defect)    Probable small perimembranous    ROS:   All systems reviewed and negative except as noted in the HPI.   Past Surgical History:  Procedure Laterality Date  . CARDIAC CATHETERIZATION  05/30/2009   Dilated cardiomyopathy.  min VSD.  Hypokin Inf  wall.  Multiple min luminal Irr.  EF 35%  .  COLONOSCOPY W/ BIOPSIES  08/24/2006   Divertics, polyps x3, bx negative, repeat 2012 with mild diverticulosis in teh sigmoid colon and 1 polyp in tranverse colon, repeat due 2017  . ESOPHAGOGASTRODUODENOSCOPY  2012   1)Barrett's esoph, 2) Erosive esophagitis, 3) Mild gastritis in the antrum, 4) Duodenitis in the bulb of duodenum, 5) Small hiatal hernia  . INGUINAL HERNIA REPAIR  1985   Left  . INGUINAL HERNIA REPAIR  1999   Right  . KNEE ARTHROSCOPY  10/15/2011   Procedure: ARTHROSCOPY KNEE;  Surgeon: Ninetta Lights, MD;  Location: [facility];  Service: Orthopedics;  Laterality: Right;  right knee scope with lateral meniscectomy, removal loose foreign body, and microfracture technique  . LAMINECTOMY  1987   with discectomy  . RIGHT/LEFT HEART CATH AND CORONARY ANGIOGRAPHY N/A 12/25/2019   Procedure: RIGHT/LEFT HEART CATH AND CORONARY ANGIOGRAPHY;  Surgeon: Wellington Hampshire, MD;  Location: [facility];  Service: Cardiovascular;  Laterality: N/A;  . ROTATOR CUFF REPAIR  2/11   rt  . TOTAL HIP ARTHROPLASTY  2011   Left  . TOTAL KNEE ARTHROPLASTY  06/01/2012   Procedure: TOTAL KNEE ARTHROPLASTY;  Surgeon: Ninetta Lights, MD;  Location: [facility];  Service: Orthopedics;  Laterality: Right;     Family History  Problem Relation Age of Onset  . Heart disease Father        CABG  . Stroke Father   . Diabetes Father   . Hypertension Mother   . Alzheimer's disease Mother   . Diabetes Sister   . Hypertension Sister   . Cancer Sister        uterine cancer  . Diabetes Brother   . Throat cancer Brother        Throat  . Esophageal cancer Brother   . Diabetes Sister   . Hypertension Sister   . Throat cancer Other        Throat  . Diabetes Other   . Alcohol abuse Other   . Diabetes Maternal Grandmother        Insulin  . Colon cancer Cousin   . Esophageal cancer Maternal Grandfather   . Drug abuse Neg Hx   . Prostate cancer Neg Hx   . Rectal cancer Neg Hx       Social History   Socioeconomic History  . Marital status: Single    Spouse name: Not on file  . Number of children: 3  . Years of education: Not on file  . Highest education level: Not on file  Occupational  History  . 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 Occupation: [employer]  Tobacco Use  . Smoking status: Former Smoker    Years: 20.00    Types: Cigarettes    Quit date: 11/09/1998    Years since quitting: 21.3  . Smokeless tobacco: Former Systems developer    Quit date: 06/03/2007  . Tobacco comment: no alcohol since nov 12  Substance and Sexual Activity  . Alcohol use: No    Alcohol/week: 0.0 standard drinks    Comment: sober as 09/2011  . Drug use: No  . Sexual activity: Not Currently  Other Topics Concern  . Not on file  Social History Narrative   Lives in [place], daughter and her 2 kids live with patient, 3 kids (2 sons, 1 daughter)   Divorced   Social Determinants of Radio broadcast assistant Strain:   . Difficulty of Paying Living Expenses:   Food Insecurity:   . Worried About Charity fundraiser in the Last Year:   . Arboriculturist in the Last Year:   Transportation Needs:   . Film/video editor (Medical):   Marland Kitchen Lack of Transportation (Non-Medical):   Physical Activity:   . Days of Exercise per Week:   . Minutes of Exercise per Session:   Stress:   . Feeling of Stress :   Social Connections:   . Frequency of Communication with Friends and Family:   . Frequency of Social Gatherings with Friends and Family:   . Attends Religious Services:   . Active Member of Clubs or Organizations:   . Attends Archivist Meetings:   Marland Kitchen Marital Status:   Intimate Partner Violence:   . Fear of Current or Ex-Partner:   . Emotionally Abused:   Marland Kitchen Physically Abused:   . Sexually Abused:      BP 140/78   Pulse (!) 56   Ht 5' 9.5" (1.765 m)   Wt 229 lb (103.9 kg)   SpO2 97%   BMI 33.33 kg/m   Physical Exam:  Well  appearing NAD HEENT: Unremarkable Neck:  No JVD, no thyromegally Lymphatics:  No adenopathy Back:  No CVA tenderness Lungs:  Clear HEART:  Regular rate rhythm, no murmurs, no rubs, no clicks Abd:  soft, positive bowel sounds, no organomegally, no rebound, no guarding Ext:  2 plus pulses, no edema, no cyanosis, no clubbing Skin:  No rashes no nodules Neuro:  CN II through XII intact, motor grossly intact  Assess/Plan: 1. SVT - unclear what the effect of flecainide was and whether this was due to SVT with abherration of VT. It stopped on its own.  2. PVC's - he has 16% of his beats PVC's and review of the morphology demonstrates origination very close to the His. I discussed catheter ablation of the PVC's. I reminded him that his PVCs are close to the conduction system. A PPM could be the result.  3. Chronic systolic heart failure - his symptoms are class 2. We will follow. He is encouraged to avoid salty foods.   Tim Mccullough.D.

## 2020-03-01 NOTE — Patient Instructions (Addendum)
Medication Instructions:  Your physician recommends that you continue on your current medications as directed. Please refer to the Current Medication list given to you today.  Labwork: None ordered.  Testing/Procedures: Your physician has recommended that you have an ablation. Catheter ablation is a medical procedure used to treat some cardiac arrhythmias (irregular heartbeats). During catheter ablation, a long, thin, flexible tube is put into a blood vessel in your groin (upper thigh), or neck. This tube is called an ablation catheter. It is then guided to your heart through the blood vessel. Radio frequency waves destroy small areas of heart tissue where abnormal heartbeats may cause an arrhythmia to start. Please see the instruction sheet given to you today.  Follow-Up:  The following dates are available for this procedure:  May 3, 10 and 13 June 1, 3, 11 and 14  Any Other Special Instructions Will Be Listed Below (If Applicable).  If you need a refill on your cardiac medications before your next appointment, please call your pharmacy.    Cardiac Ablation Cardiac ablation is a procedure to disable (ablate) a small amount of heart tissue in very specific places. The heart has many electrical connections. Sometimes these connections are abnormal and can cause the heart to beat very fast or irregularly. Ablating some of the problem areas can improve the heart rhythm or return it to normal. Ablation may be done for people who:  Have Wolff-Parkinson-White syndrome.  Have fast heart rhythms (tachycardia).  Have taken medicines for an abnormal heart rhythm (arrhythmia) that were not effective or caused side effects.  Have a high-risk heartbeat that may be life-threatening. During the procedure, a small incision is made in the neck or the groin, and a long, thin, flexible tube (catheter) is inserted into the incision and moved to the heart. Small devices (electrodes) on the tip of the  catheter will send out electrical currents. A type of X-ray (fluoroscopy) will be used to help guide the catheter and to provide images of the heart. Tell a health care provider about:  Any allergies you have.  All medicines you are taking, including vitamins, herbs, eye drops, creams, and over-the-counter medicines.  Any problems you or family members have had with anesthetic medicines.  Any blood disorders you have.  Any surgeries you have had.  Any medical conditions you have, such as kidney failure.  Whether you are pregnant or may be pregnant. What are the risks? Generally, this is a safe procedure. However, problems may occur, including:  Infection.  Bruising and bleeding at the catheter insertion site.  Bleeding into the chest, especially into the sac that surrounds the heart. This is a serious complication.  Stroke or blood clots.  Damage to other structures or organs.  Allergic reaction to medicines or dyes.  Need for a permanent pacemaker if the normal electrical system is damaged. A pacemaker is a small computer that sends electrical signals to the heart and helps your heart beat normally.  The procedure not being fully effective. This may not be recognized until months later. Repeat ablation procedures are sometimes required. What happens before the procedure?  Follow instructions from your health care provider about eating or drinking restrictions.  Ask your health care provider about: ? Changing or stopping your regular medicines. This is especially important if you are taking diabetes medicines or blood thinners. ? Taking medicines such as aspirin and ibuprofen. These medicines can thin your blood. Do not take these medicines before your procedure if your health care  provider instructs you not to.  Plan to have someone take you home from the hospital or clinic.  If you will be going home right after the procedure, plan to have someone with you for 24  hours. What happens during the procedure?  To lower your risk of infection: ? Your health care team will wash or sanitize their hands. ? Your skin will be washed with soap. ? Hair may be removed from the incision area.  An IV tube will be inserted into one of your veins.  You will be given a medicine to help you relax (sedative).  The skin on your neck or groin will be numbed.  An incision will be made in your neck or your groin.  A needle will be inserted through the incision and into a large vein in your neck or groin.  A catheter will be inserted into the needle and moved to your heart.  Dye may be injected through the catheter to help your surgeon see the area of the heart that needs treatment.  Electrical currents will be sent from the catheter to ablate heart tissue in desired areas. There are three types of energy that may be used to ablate heart tissue: ? Heat (radiofrequency energy). ? Laser energy. ? Extreme cold (cryoablation).  When the necessary tissue has been ablated, the catheter will be removed.  Pressure will be held on the catheter insertion area to prevent excessive bleeding.  A bandage (dressing) will be placed over the catheter insertion area. The procedure may vary among health care providers and hospitals. What happens after the procedure?  Your blood pressure, heart rate, breathing rate, and blood oxygen level will be monitored until the medicines you were given have worn off.  Your catheter insertion area will be monitored for bleeding. You will need to lie still for a few hours to ensure that you do not bleed from the catheter insertion area.  Do not drive for 24 hours or as long as directed by your health care provider. Summary  Cardiac ablation is a procedure to disable (ablate) a small amount of heart tissue in very specific places. Ablating some of the problem areas can improve the heart rhythm or return it to normal.  During the procedure,  electrical currents will be sent from the catheter to ablate heart tissue in desired areas. This information is not intended to replace advice given to you by your health care provider. Make sure you discuss any questions you have with your health care provider. Document Revised: 04/18/2018 Document Reviewed: 09/14/2016 Elsevier Patient Education  [place].

## 2020-03-01 NOTE — H&P (View-Only) (Signed)
HPI [name] is referred today by Dr. Caryl Comes to consider catheter ablation of SVT as well as PVC's. He has a non-ischemic CM with an EF of 35%. He had been tried on flecainide but this was stoppedafter presenting with a wide QRS tachycardia. He does not have much in the way palpitations. When he presented with a wide QRS tachy last month he felt dizzy but not much in the way of palpitations. His flecainide was stopped. He continues to have PVC's.  No Known Allergies   Current Outpatient Medications  Medication Sig Dispense Refill  . aspirin 81 MG tablet Take 81 mg by mouth daily.     Marland Kitchen atorvastatin (LIPITOR) 40 MG tablet TAKE 1 TABLET BY MOUTH EVERY DAY 90 tablet 3  . doxazosin (CARDURA) 1 MG tablet 1 tablet daily 90 tablet 3  . fish oil-omega-3 fatty acids 1000 MG capsule Take 1 g by mouth 2 (two) times daily.     Marland Kitchen losartan (COZAAR) 25 MG tablet Take 1 tablet (25 mg) by mouth once daily at bedtime    . magnesium oxide (MAG-OX) 400 (241.3 Mg) MG tablet Take 1 tablet (400 mg total) by mouth daily. 30 tablet 0  . metFORMIN (GLUCOPHAGE) 500 MG tablet TAKE 2 TABLETS (1,000 MG TOTAL) BY MOUTH 2 (TWO) TIMES DAILY WITH A MEAL. 360 tablet 3  . metoprolol tartrate (LOPRESSOR) 25 MG tablet Take 37.5 mg by mouth 2 (two) times daily.    Marland Kitchen omeprazole (PRILOSEC) 20 MG capsule Take 1 capsule (20 mg total) by mouth daily. 30 capsule 11  . ONE TOUCH ULTRA TEST test strip CHECK BLOOD SUGAR DAILY AND IF BLOOD SUGAR FLUCTUATES. 250.00 100 each 11  . PARoxetine (PAXIL) 20 MG tablet Take 1 tablet (20 mg total) by mouth daily. 90 tablet 3  . spironolactone (ALDACTONE) 25 MG tablet Take 0.5 tablet (12.5 mg) by mouth once daily at bedtime    . triamcinolone ointment (KENALOG) 0.1 % Apply 1 application topically 2 (two) times daily as needed.      No current facility-administered medications for this visit.     Past Medical History:  Diagnosis Date  . Abnormal echocardiogram 2/11   Repeated after med  mgmt of cardiomyopathy showed EF 40-45%, mildly dilated LV, mild LV hypertrophy, anterolateral and apical hypokinesis, mild diastolic dysfunction.  . Anemia    hx  . Arthritis   . Blood transfusion without reported diagnosis 09/2012  . BPH (benign prostatic hypertrophy) 2004  . Cancer ([place])    melanoma back  . Cardiomyopathy    ? tachycardia-mediated  . Depression   . Diabetes mellitus 11/2006   Type II  . Diverticulosis   . Echocardiogram abnormal 7/10   Mild LVH with a mildly dilated LV.  EF was difficult to assess given poor acoustic windows but appeared moderately decreased.  Mild MR.  Probable small perimembranous VSD, mild LAE  . Esophageal erosions   . GERD (gastroesophageal reflux disease)   . Hip pain   . History of MRI 7/10   Cardiac MRI was done to followup difficult echo.  This showed mild to moderately dilated LV, mild to moderate LAE, EF  33% with global hypokinesis, normal RV size with mild systolic dysfunction.  There was no large VSD evident.  There was no myocardial delayed enhancement so no evidence for infiltrative disease or infarction.  LHC (7/10) showed only luminal irreg. in the coronaries and EF 35%  . Holter monitor, abnormal 7/10  Frequent PAC's and PVC's  Average HR 96  (but he had started Toprol XL).  HR range 71-130  . Hyperlipidemia 04/1998  . Hypertension 1995  . Obesity   . OSA (obstructive sleep apnea)    Not using CPAP- improved with weight loss  . Pneumonia    HX OF pna  . Sinus tachycardia    Pt says he has been told his heart rate is high since he was in high school.   . Sleep apnea    has cpap; but does not use  . VSD (ventricular septal defect)    Probable small perimembranous    ROS:   All systems reviewed and negative except as noted in the HPI.   Past Surgical History:  Procedure Laterality Date  . CARDIAC CATHETERIZATION  05/30/2009   Dilated cardiomyopathy.  min VSD.  Hypokin Inf  wall.  Multiple min luminal Irr.  EF 35%  .  COLONOSCOPY W/ BIOPSIES  08/24/2006   Divertics, polyps x3, bx negative, repeat 2012 with mild diverticulosis in teh sigmoid colon and 1 polyp in tranverse colon, repeat due 2017  . ESOPHAGOGASTRODUODENOSCOPY  2012   1)Barrett's esoph, 2) Erosive esophagitis, 3) Mild gastritis in the antrum, 4) Duodenitis in the bulb of duodenum, 5) Small hiatal hernia  . INGUINAL HERNIA REPAIR  1985   Left  . INGUINAL HERNIA REPAIR  1999   Right  . KNEE ARTHROSCOPY  10/15/2011   Procedure: ARTHROSCOPY KNEE;  Surgeon: Ninetta Lights, MD;  Location: [facility];  Service: Orthopedics;  Laterality: Right;  right knee scope with lateral meniscectomy, removal loose foreign body, and microfracture technique  . LAMINECTOMY  1987   with discectomy  . RIGHT/LEFT HEART CATH AND CORONARY ANGIOGRAPHY N/A 12/25/2019   Procedure: RIGHT/LEFT HEART CATH AND CORONARY ANGIOGRAPHY;  Surgeon: Wellington Hampshire, MD;  Location: [facility];  Service: Cardiovascular;  Laterality: N/A;  . ROTATOR CUFF REPAIR  2/11   rt  . TOTAL HIP ARTHROPLASTY  2011   Left  . TOTAL KNEE ARTHROPLASTY  06/01/2012   Procedure: TOTAL KNEE ARTHROPLASTY;  Surgeon: Ninetta Lights, MD;  Location: [facility];  Service: Orthopedics;  Laterality: Right;     Family History  Problem Relation Age of Onset  . Heart disease Father        CABG  . Stroke Father   . Diabetes Father   . Hypertension Mother   . Alzheimer's disease Mother   . Diabetes Sister   . Hypertension Sister   . Cancer Sister        uterine cancer  . Diabetes Brother   . Throat cancer Brother        Throat  . Esophageal cancer Brother   . Diabetes Sister   . Hypertension Sister   . Throat cancer Other        Throat  . Diabetes Other   . Alcohol abuse Other   . Diabetes Maternal Grandmother        Insulin  . Colon cancer Cousin   . Esophageal cancer Maternal Grandfather   . Drug abuse Neg Hx   . Prostate cancer Neg Hx   . Rectal cancer Neg Hx       Social History   Socioeconomic History  . Marital status: Single    Spouse name: Not on file  . Number of children: 3  . Years of education: Not on file  . Highest education level: Not on file  Occupational  History  . 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 Occupation: [employer]  Tobacco Use  . Smoking status: Former Smoker    Years: 20.00    Types: Cigarettes    Quit date: 11/09/1998    Years since quitting: 21.3  . Smokeless tobacco: Former Systems developer    Quit date: 06/03/2007  . Tobacco comment: no alcohol since nov 12  Substance and Sexual Activity  . Alcohol use: No    Alcohol/week: 0.0 standard drinks    Comment: sober as 09/2011  . Drug use: No  . Sexual activity: Not Currently  Other Topics Concern  . Not on file  Social History Narrative   Lives in [place], daughter and her 2 kids live with patient, 3 kids (2 sons, 1 daughter)   Divorced   Social Determinants of Radio broadcast assistant Strain:   . Difficulty of Paying Living Expenses:   Food Insecurity:   . Worried About Charity fundraiser in the Last Year:   . Arboriculturist in the Last Year:   Transportation Needs:   . Film/video editor (Medical):   Marland Kitchen Lack of Transportation (Non-Medical):   Physical Activity:   . Days of Exercise per Week:   . Minutes of Exercise per Session:   Stress:   . Feeling of Stress :   Social Connections:   . Frequency of Communication with Friends and Family:   . Frequency of Social Gatherings with Friends and Family:   . Attends Religious Services:   . Active Member of Clubs or Organizations:   . Attends Archivist Meetings:   Marland Kitchen Marital Status:   Intimate Partner Violence:   . Fear of Current or Ex-Partner:   . Emotionally Abused:   Marland Kitchen Physically Abused:   . Sexually Abused:      BP 140/78   Pulse (!) 56   Ht 5' 9.5" (1.765 m)   Wt 229 lb (103.9 kg)   SpO2 97%   BMI 33.33 kg/m   Physical Exam:  Well  appearing NAD HEENT: Unremarkable Neck:  No JVD, no thyromegally Lymphatics:  No adenopathy Back:  No CVA tenderness Lungs:  Clear HEART:  Regular rate rhythm, no murmurs, no rubs, no clicks Abd:  soft, positive bowel sounds, no organomegally, no rebound, no guarding Ext:  2 plus pulses, no edema, no cyanosis, no clubbing Skin:  No rashes no nodules Neuro:  CN II through XII intact, motor grossly intact  Assess/Plan: 1. SVT - unclear what the effect of flecainide was and whether this was due to SVT with abherration of VT. It stopped on its own.  2. PVC's - he has 16% of his beats PVC's and review of the morphology demonstrates origination very close to the His. I discussed catheter ablation of the PVC's. I reminded him that his PVCs are close to the conduction system. A PPM could be the result.  3. Chronic systolic heart failure - his symptoms are class 2. We will follow. He is encouraged to avoid salty foods.   Tim Mccullough.D.

## 2020-03-05 ENCOUNTER — Telehealth: Payer: Self-pay

## 2020-03-05 DIAGNOSIS — H2513 Age-related nuclear cataract, bilateral: Secondary | ICD-10-CM | POA: Diagnosis not present

## 2020-03-05 DIAGNOSIS — H5203 Hypermetropia, bilateral: Secondary | ICD-10-CM | POA: Diagnosis not present

## 2020-03-05 DIAGNOSIS — H524 Presbyopia: Secondary | ICD-10-CM | POA: Diagnosis not present

## 2020-03-05 DIAGNOSIS — H52222 Regular astigmatism, left eye: Secondary | ICD-10-CM | POA: Diagnosis not present

## 2020-03-05 DIAGNOSIS — E119 Type 2 diabetes mellitus without complications: Secondary | ICD-10-CM | POA: Diagnosis not present

## 2020-03-05 DIAGNOSIS — I10 Essential (primary) hypertension: Secondary | ICD-10-CM | POA: Diagnosis not present

## 2020-03-05 DIAGNOSIS — H35033 Hypertensive retinopathy, bilateral: Secondary | ICD-10-CM | POA: Diagnosis not present

## 2020-03-05 DIAGNOSIS — I493 Ventricular premature depolarization: Secondary | ICD-10-CM

## 2020-03-05 LAB — HM DIABETES EYE EXAM

## 2020-03-06 NOTE — Telephone Encounter (Signed)
Ablation scheduled for May 10  Pre procedure labs/covid test scheduled at [facility]  Work up complete

## 2020-03-07 ENCOUNTER — Encounter: Payer: Self-pay | Admitting: *Deleted

## 2020-03-07 ENCOUNTER — Telehealth: Payer: Self-pay | Admitting: *Deleted

## 2020-03-07 DIAGNOSIS — I5022 Chronic systolic (congestive) heart failure: Secondary | ICD-10-CM

## 2020-03-07 NOTE — Telephone Encounter (Signed)
Called to check on pt.  He has missed a couple of visits for doctor's appointments.  He has cataracts on both eyes and is scheduled for an ablation for 5/10.  He has also been feeling under the weather some this week.  He hopes to return next week.

## 2020-03-11 ENCOUNTER — Other Ambulatory Visit: Payer: Self-pay

## 2020-03-11 ENCOUNTER — Encounter: Payer: Medicare Other | Attending: Cardiovascular Disease | Admitting: *Deleted

## 2020-03-11 DIAGNOSIS — I110 Hypertensive heart disease with heart failure: Secondary | ICD-10-CM | POA: Insufficient documentation

## 2020-03-11 DIAGNOSIS — E119 Type 2 diabetes mellitus without complications: Secondary | ICD-10-CM | POA: Insufficient documentation

## 2020-03-11 DIAGNOSIS — I5022 Chronic systolic (congestive) heart failure: Secondary | ICD-10-CM | POA: Diagnosis not present

## 2020-03-11 DIAGNOSIS — Z87891 Personal history of nicotine dependence: Secondary | ICD-10-CM | POA: Diagnosis not present

## 2020-03-11 NOTE — Progress Notes (Signed)
Daily Session Note  Patient Details  Name: Tim Mccullough MRN: [id number] Date of Birth: 1951-10-18 Referring Provider:     Cardiac Rehab from 01/08/2020 in [facility] Cardiac and Pulmonary Rehab  Referring Provider  [name] MD      Encounter Date: 03/11/2020  Check In: Session Check In - 03/11/20 1302      Check-In   Supervising physician immediately available to respond to emergencies  See telemetry face sheet for immediately available ER MD    Location  [facility]-Cardiac & Pulmonary Rehab    Staff Present  Renita Papa, RN BSN;Joseph Hood RCP,RRT,BSRT;Melissa [place] RDN, LDN    Virtual Visit  No    Medication changes reported      No    Fall or balance concerns reported     No    Warm-up and Cool-down  Performed on first and last piece of equipment    Resistance Training Performed  Yes    VAD Patient?  No    PAD/SET Patient?  No      Pain Assessment   Currently in Pain?  No/denies          Social History   Tobacco Use  Smoking Status Former Smoker  . Years: 20.00  . Types: Cigarettes  . Quit date: 11/09/1998  . Years since quitting: 21.3  Smokeless Tobacco Former Systems developer  . Quit date: 06/03/2007  Tobacco Comment   no alcohol since nov 12    Goals Met:  Independence with exercise equipment Exercise tolerated well No report of cardiac concerns or symptoms Strength training completed today  Goals Unmet:  Not Applicable  Comments: Pt able to follow exercise prescription today without complaint.  Will continue to monitor for progression.    Dr. Emily Filbert is Medical Director for [facility].

## 2020-03-13 ENCOUNTER — Other Ambulatory Visit: Payer: Self-pay

## 2020-03-13 ENCOUNTER — Encounter: Payer: Medicare Other | Admitting: *Deleted

## 2020-03-13 DIAGNOSIS — E119 Type 2 diabetes mellitus without complications: Secondary | ICD-10-CM | POA: Diagnosis not present

## 2020-03-13 DIAGNOSIS — I5022 Chronic systolic (congestive) heart failure: Secondary | ICD-10-CM

## 2020-03-13 DIAGNOSIS — Z87891 Personal history of nicotine dependence: Secondary | ICD-10-CM | POA: Diagnosis not present

## 2020-03-13 DIAGNOSIS — I110 Hypertensive heart disease with heart failure: Secondary | ICD-10-CM | POA: Diagnosis not present

## 2020-03-13 NOTE — Progress Notes (Signed)
Daily Session Note  Patient Details  Name: Tim Mccullough MRN: [id number] Date of Birth: 1951/05/19 Referring Provider:     Cardiac Rehab from 01/08/2020 in [facility] Cardiac and Pulmonary Rehab  Referring Provider  [name] MD      Encounter Date: 03/13/2020  Check In: Session Check In - 03/13/20 1312      Check-In   Supervising physician immediately available to respond to emergencies  See telemetry face sheet for immediately available ER MD    Location  [facility]-Cardiac & Pulmonary Rehab    Staff Present  Renita Papa, RN BSN;Jessica Luan Pulling, MA, RCEP, CCRP, CCET;Melissa [place] RDN, LDN    Virtual Visit  No    Medication changes reported      No    Fall or balance concerns reported     No    Warm-up and Cool-down  Performed on first and last piece of equipment    Resistance Training Performed  Yes    VAD Patient?  No    PAD/SET Patient?  No      Pain Assessment   Currently in Pain?  No/denies          Social History   Tobacco Use  Smoking Status Former Smoker  . Years: 20.00  . Types: Cigarettes  . Quit date: 11/09/1998  . Years since quitting: 21.3  Smokeless Tobacco Former Systems developer  . Quit date: 06/03/2007  Tobacco Comment   no alcohol since nov 12    Goals Met:  Independence with exercise equipment Exercise tolerated well No report of cardiac concerns or symptoms Strength training completed today  Goals Unmet:  Not Applicable  Comments: Pt able to follow exercise prescription today without complaint.  Will continue to monitor for progression.    Dr. Emily Filbert is Medical Director for [facility].

## 2020-03-14 ENCOUNTER — Other Ambulatory Visit: Payer: Self-pay

## 2020-03-14 ENCOUNTER — Encounter: Payer: Medicare Other | Admitting: *Deleted

## 2020-03-14 ENCOUNTER — Other Ambulatory Visit: Payer: Medicare Other

## 2020-03-14 DIAGNOSIS — I5022 Chronic systolic (congestive) heart failure: Secondary | ICD-10-CM | POA: Diagnosis not present

## 2020-03-14 DIAGNOSIS — Z87891 Personal history of nicotine dependence: Secondary | ICD-10-CM | POA: Diagnosis not present

## 2020-03-14 DIAGNOSIS — E119 Type 2 diabetes mellitus without complications: Secondary | ICD-10-CM | POA: Diagnosis not present

## 2020-03-14 DIAGNOSIS — I110 Hypertensive heart disease with heart failure: Secondary | ICD-10-CM | POA: Diagnosis not present

## 2020-03-14 NOTE — Progress Notes (Signed)
Daily Session Note  Patient Details  Name: Tim Mccullough MRN: [id number] Date of Birth: 10/08/1951 Referring Provider:     Cardiac Rehab from 01/08/2020 in [facility] Cardiac and Pulmonary Rehab  Referring Provider  [name] MD      Encounter Date: 03/14/2020  Check In: Session Check In - 03/14/20 1317      Check-In   Supervising physician immediately available to respond to emergencies  See telemetry face sheet for immediately available ER MD    Location  [facility]-Cardiac & Pulmonary Rehab    Staff Present  Renita Papa, RN BSN;Joseph Hood RCP,RRT,BSRT;Amanda Emery, [place], ACSM CEP, Exercise Physiologist    Virtual Visit  No    Medication changes reported      No    Fall or balance concerns reported     No    Warm-up and Cool-down  Performed on first and last piece of equipment    Resistance Training Performed  Yes    VAD Patient?  No    PAD/SET Patient?  No      Pain Assessment   Currently in Pain?  No/denies          Social History   Tobacco Use  Smoking Status Former Smoker  . Years: 20.00  . Types: Cigarettes  . Quit date: 11/09/1998  . Years since quitting: 21.3  Smokeless Tobacco Former Systems developer  . Quit date: 06/03/2007  Tobacco Comment   no alcohol since nov 12    Goals Met:  Independence with exercise equipment Exercise tolerated well No report of cardiac concerns or symptoms Strength training completed today  Goals Unmet:  Not Applicable  Comments: Pt able to follow exercise prescription today without complaint.  Will continue to monitor for progression.    Dr. Emily Filbert is Medical Director for [facility].

## 2020-03-15 ENCOUNTER — Other Ambulatory Visit
Admission: RE | Admit: 2020-03-15 | Discharge: 2020-03-15 | Disposition: A | Discharge status: Home / Self Care | Payer: Medicare Other | Source: Ambulatory Visit | Attending: Internal Medicine | Admitting: Internal Medicine

## 2020-03-15 DIAGNOSIS — I493 Ventricular premature depolarization: Secondary | ICD-10-CM

## 2020-03-15 DIAGNOSIS — Z01812 Encounter for preprocedural laboratory examination: Secondary | ICD-10-CM | POA: Diagnosis not present

## 2020-03-15 DIAGNOSIS — Z20822 Contact with and (suspected) exposure to covid-19: Secondary | ICD-10-CM | POA: Diagnosis not present

## 2020-03-15 LAB — CBC WITH DIFFERENTIAL/PLATELET
Abs Immature Granulocytes: 0.01 10*3/uL (ref 0.00–0.07)
Basophils Absolute: 0 10*3/uL (ref 0.0–0.1)
Basophils Relative: 0 %
Eosinophils Absolute: 0.1 10*3/uL (ref 0.0–0.5)
Eosinophils Relative: 2 %
HCT: 38.9 % — ABNORMAL LOW (ref 39.0–52.0)
Hemoglobin: 13.1 g/dL (ref 13.0–17.0)
Immature Granulocytes: 0 %
Lymphocytes Relative: 32 %
Lymphs Abs: 2.1 10*3/uL (ref 0.7–4.0)
MCH: 31.5 pg (ref 26.0–34.0)
MCHC: 33.7 g/dL (ref 30.0–36.0)
MCV: 93.5 fL (ref 80.0–100.0)
Monocytes Absolute: 0.5 10*3/uL (ref 0.1–1.0)
Monocytes Relative: 8 %
Neutro Abs: 3.8 10*3/uL (ref 1.7–7.7)
Neutrophils Relative %: 58 %
Platelets: 228 10*3/uL (ref 150–400)
RBC: 4.16 MIL/uL — ABNORMAL LOW (ref 4.22–5.81)
RDW: 12.3 % (ref 11.5–15.5)
WBC: 6.4 10*3/uL (ref 4.0–10.5)
nRBC: 0 % (ref 0.0–0.2)

## 2020-03-15 LAB — SARS CORONAVIRUS 2 (TAT 6-24 HRS): SARS Coronavirus 2: NEGATIVE

## 2020-03-15 LAB — BASIC METABOLIC PANEL
Anion gap: 8 (ref 5–15)
BUN: 27 mg/dL — ABNORMAL HIGH (ref 8–23)
CO2: 27 mmol/L (ref 22–32)
Calcium: 8.7 mg/dL — ABNORMAL LOW (ref 8.9–10.3)
Chloride: 104 mmol/L (ref 98–111)
Creatinine, Ser: 1.09 mg/dL (ref 0.61–1.24)
GFR calc Af Amer: >60 mL/min (ref >60)
GFR calc non Af Amer: >60 mL/min (ref >60)
Glucose, Bld: 188 mg/dL — ABNORMAL HIGH (ref 70–99)
Potassium: 4.4 mmol/L (ref 3.5–5.1)
Sodium: 139 mmol/L (ref 135–145)

## 2020-03-17 NOTE — Anesthesia Preprocedure Evaluation (Addendum)
Anesthesia Evaluation  Patient identified by MRN, date of birth, ID band Patient awake    Reviewed: Allergy & Precautions, NPO status , Patient's Chart, lab work & pertinent test results  History of Anesthesia Complications Negative for: history of anesthetic complications  Airway Mallampati: II  TM Distance: >3 FB Neck ROM: Full    Dental  (+) Dental Advisory Given, Caps, Partial Upper,    Pulmonary sleep apnea , former smoker,    Pulmonary exam normal breath sounds clear to auscultation       Cardiovascular hypertension, Pt. on home beta blockers and Pt. on medications (-) angina+CHF  (-) Past MI and (-) Cardiac Stents Normal cardiovascular exam+ dysrhythmias Supra Ventricular Tachycardia  Rhythm:Regular Rate:Normal  Echo 11/22/19: 1. Left ventricular ejection fraction, by visual estimation, is 30 to  35%. The left ventricle has moderate to severely decreased function. There  is no left ventricular hypertrophy.  2. Left ventricular diastolic function could not be evaluated.  3. Mildly dilated left ventricular internal cavity size.  4. The left ventricle demonstrates global hypokinesis.  5. Global right ventricle has mildly reduced systolic function.The right  ventricular size is normal. Right vetricular wall thickness was not  assessed.  6. Left atrial size was mildly dilated.  7. Right atrial size was normal.  8. The mitral valve is normal in structure. No evidence of mitral valve  regurgitation.  9. The tricuspid valve is grossly normal.  10. The aortic valve is grossly normal. Aortic valve regurgitation is not  visualized.  11. Pulmonic regurgitation not assessed.  12. The pulmonic valve was not well visualized. Pulmonic valve  regurgitation not assessed.    Neuro/Psych PSYCHIATRIC DISORDERS Depression negative neurological ROS     GI/Hepatic Neg liver ROS, GERD  Medicated,  Endo/Other  diabetes, Type 2,  Oral Hypoglycemic AgentsObesity   Renal/GU Renal InsufficiencyRenal disease     Musculoskeletal  (+) Arthritis ,   Abdominal   Peds  Hematology negative hematology ROS (+)   Anesthesia Other Findings Day of surgery medications reviewed with the patient.  Reproductive/Obstetrics                           Anesthesia Physical Anesthesia Plan  ASA: IV  Anesthesia Plan: MAC   Post-op Pain Management:    Induction: Intravenous  PONV Risk Score and Plan: 2 and Ondansetron and Propofol infusion  Airway Management Planned: Natural Airway and Nasal Cannula  Additional Equipment:   Intra-op Plan:   Post-operative Plan:   Informed Consent: I have reviewed the patients History and Physical, chart, labs and discussed the procedure including the risks, benefits and alternatives for the proposed anesthesia with the patient or authorized representative who has indicated his/her understanding and acceptance.     Dental advisory given  Plan Discussed with: CRNA  Anesthesia Plan Comments:       Anesthesia Quick Evaluation

## 2020-03-18 ENCOUNTER — Other Ambulatory Visit: Payer: Self-pay

## 2020-03-18 ENCOUNTER — Ambulatory Visit (HOSPITAL_COMMUNITY): Payer: Medicare Other | Admitting: Anesthesiology

## 2020-03-18 ENCOUNTER — Encounter (HOSPITAL_COMMUNITY)
Admission: RE | Disposition: A | Discharge status: Home / Self Care | Payer: Self-pay | Source: Home / Self Care | Attending: Internal Medicine

## 2020-03-18 ENCOUNTER — Ambulatory Visit (HOSPITAL_COMMUNITY)
Admission: RE | Admit: 2020-03-18 | Discharge: 2020-03-19 | Disposition: A | Discharge status: Home / Self Care | Payer: Medicare Other | Attending: Internal Medicine | Admitting: Internal Medicine

## 2020-03-18 DIAGNOSIS — E785 Hyperlipidemia, unspecified: Secondary | ICD-10-CM | POA: Insufficient documentation

## 2020-03-18 DIAGNOSIS — I110 Hypertensive heart disease with heart failure: Secondary | ICD-10-CM | POA: Diagnosis not present

## 2020-03-18 DIAGNOSIS — Z87891 Personal history of nicotine dependence: Secondary | ICD-10-CM | POA: Diagnosis not present

## 2020-03-18 DIAGNOSIS — E119 Type 2 diabetes mellitus without complications: Secondary | ICD-10-CM | POA: Insufficient documentation

## 2020-03-18 DIAGNOSIS — Z6832 Body mass index (BMI) 32.0-32.9, adult: Secondary | ICD-10-CM | POA: Diagnosis not present

## 2020-03-18 DIAGNOSIS — E669 Obesity, unspecified: Secondary | ICD-10-CM | POA: Diagnosis not present

## 2020-03-18 DIAGNOSIS — I471 Supraventricular tachycardia: Secondary | ICD-10-CM | POA: Diagnosis not present

## 2020-03-18 DIAGNOSIS — Z7982 Long term (current) use of aspirin: Secondary | ICD-10-CM | POA: Diagnosis not present

## 2020-03-18 DIAGNOSIS — G4730 Sleep apnea, unspecified: Secondary | ICD-10-CM | POA: Diagnosis not present

## 2020-03-18 DIAGNOSIS — Z7984 Long term (current) use of oral hypoglycemic drugs: Secondary | ICD-10-CM | POA: Insufficient documentation

## 2020-03-18 DIAGNOSIS — I493 Ventricular premature depolarization: Secondary | ICD-10-CM | POA: Diagnosis present

## 2020-03-18 DIAGNOSIS — K219 Gastro-esophageal reflux disease without esophagitis: Secondary | ICD-10-CM | POA: Diagnosis not present

## 2020-03-18 DIAGNOSIS — Q210 Ventricular septal defect: Secondary | ICD-10-CM | POA: Insufficient documentation

## 2020-03-18 DIAGNOSIS — R000 Tachycardia, unspecified: Secondary | ICD-10-CM

## 2020-03-18 DIAGNOSIS — G4733 Obstructive sleep apnea (adult) (pediatric): Secondary | ICD-10-CM | POA: Insufficient documentation

## 2020-03-18 DIAGNOSIS — F329 Major depressive disorder, single episode, unspecified: Secondary | ICD-10-CM | POA: Insufficient documentation

## 2020-03-18 DIAGNOSIS — I472 Ventricular tachycardia: Secondary | ICD-10-CM

## 2020-03-18 DIAGNOSIS — I5022 Chronic systolic (congestive) heart failure: Secondary | ICD-10-CM | POA: Diagnosis not present

## 2020-03-18 DIAGNOSIS — Z79899 Other long term (current) drug therapy: Secondary | ICD-10-CM | POA: Insufficient documentation

## 2020-03-18 HISTORY — PX: PVC ABLATION: EP1236

## 2020-03-18 LAB — GLUCOSE, CAPILLARY
Glucose-Capillary: 147 mg/dL — ABNORMAL HIGH (ref 70–99)
Glucose-Capillary: 99 mg/dL (ref 70–99)
Glucose-Capillary: 91 mg/dL (ref 70–99)
Glucose-Capillary: 199 mg/dL — ABNORMAL HIGH (ref 70–99)

## 2020-03-18 LAB — POCT ACTIVATED CLOTTING TIME
Activated Clotting Time: 191 seconds
Activated Clotting Time: 219 seconds
Activated Clotting Time: 219 seconds
Activated Clotting Time: 257 seconds
Activated Clotting Time: 175 seconds

## 2020-03-18 SURGERY — PVC ABLATION
Anesthesia: General

## 2020-03-18 MED ORDER — ONDANSETRON HCL 4 MG/2ML IJ SOLN: 4.0000 mg | Freq: Four times a day (QID) | INTRAMUSCULAR | Status: DC | PRN

## 2020-03-18 MED ORDER — HEPARIN (PORCINE) IN NACL 1000-0.9 UT/500ML-% IV SOLN
INTRAVENOUS | Status: AC
  Filled 2020-03-18: qty 500

## 2020-03-18 MED ORDER — PROPOFOL 10 MG/ML IV BOLUS
INTRAVENOUS | Status: DC | PRN
  Administered 2020-03-18: 40 mg via INTRAVENOUS

## 2020-03-18 MED ORDER — FENTANYL CITRATE (PF) 100 MCG/2ML IJ SOLN
INTRAMUSCULAR | Status: AC
  Filled 2020-03-18: qty 2

## 2020-03-18 MED ORDER — ACETAMINOPHEN 325 MG PO TABS: 650.0000 mg | ORAL_TABLET | ORAL | Status: DC | PRN

## 2020-03-18 MED ORDER — SODIUM CHLORIDE 0.9% FLUSH: 3.0000 mL | INTRAVENOUS | Status: DC | PRN

## 2020-03-18 MED ORDER — PROPOFOL 500 MG/50ML IV EMUL
INTRAVENOUS | Status: DC | PRN
Start: 2020-03-18 — End: 2020-03-18
  Administered 2020-03-18: 75 ug/kg/min via INTRAVENOUS
  Administered 2020-03-18: 50 ug/kg/min via INTRAVENOUS

## 2020-03-18 MED ORDER — ACETAMINOPHEN 500 MG PO TABS
1000.0000 mg | ORAL_TABLET | Freq: Once | ORAL | Status: AC
  Administered 2020-03-18: 1000 mg via ORAL
  Filled 2020-03-18 (×2): qty 2

## 2020-03-18 MED ORDER — LIDOCAINE 2% (20 MG/ML) 5 ML SYRINGE
INTRAMUSCULAR | Status: DC | PRN
  Administered 2020-03-18: 100 mg via INTRAVENOUS

## 2020-03-18 MED ORDER — ONDANSETRON HCL 4 MG/2ML IJ SOLN
INTRAMUSCULAR | Status: DC | PRN
  Administered 2020-03-18: 4 mg via INTRAVENOUS

## 2020-03-18 MED ORDER — PHENYLEPHRINE HCL-NACL 10-0.9 MG/250ML-% IV SOLN
INTRAVENOUS | Status: DC | PRN
  Administered 2020-03-18: 30 ug/min via INTRAVENOUS

## 2020-03-18 MED ORDER — DRONEDARONE HCL 400 MG PO TABS
400.0000 mg | ORAL_TABLET | Freq: Two times a day (BID) | ORAL | Status: DC
  Administered 2020-03-18 – 2020-03-19 (×2): 400 mg via ORAL
  Filled 2020-03-18 (×5): qty 1

## 2020-03-18 MED ORDER — HEPARIN SODIUM (PORCINE) 1000 UNIT/ML IJ SOLN
INTRAMUSCULAR | Status: AC
  Filled 2020-03-18: qty 1

## 2020-03-18 MED ORDER — FENTANYL CITRATE (PF) 100 MCG/2ML IJ SOLN
INTRAMUSCULAR | Status: DC | PRN
  Administered 2020-03-18: 100 ug via INTRAVENOUS

## 2020-03-18 MED ORDER — HEPARIN SODIUM (PORCINE) 1000 UNIT/ML IJ SOLN
INTRAMUSCULAR | Status: DC | PRN
Start: 2020-03-18 — End: 2020-03-18
  Administered 2020-03-18: 7000 [IU] via INTRAVENOUS
  Administered 2020-03-18 (×2): 3000 [IU] via INTRAVENOUS

## 2020-03-18 MED ORDER — SODIUM CHLORIDE 0.9 % IV SOLN: 250.0000 mL | INTRAVENOUS | Status: DC | PRN

## 2020-03-18 MED ORDER — HEPARIN (PORCINE) IN NACL 1000-0.9 UT/500ML-% IV SOLN
INTRAVENOUS | Status: DC | PRN
  Administered 2020-03-18: 500 mL

## 2020-03-18 MED ORDER — BUPIVACAINE HCL (PF) 0.25 % IJ SOLN
INTRAMUSCULAR | Status: AC
  Filled 2020-03-18: qty 60

## 2020-03-18 MED ORDER — BUPIVACAINE HCL (PF) 0.25 % IJ SOLN
INTRAMUSCULAR | Status: DC | PRN
  Administered 2020-03-18: 60 mL

## 2020-03-18 MED ORDER — SODIUM CHLORIDE 0.9% FLUSH
3.0000 mL | Freq: Two times a day (BID) | INTRAVENOUS | Status: DC
  Administered 2020-03-18: 3 mL via INTRAVENOUS

## 2020-03-18 MED ORDER — SODIUM CHLORIDE 0.9 % IV SOLN: INTRAVENOUS | Status: DC

## 2020-03-18 SURGICAL SUPPLY — 14 items
BAG SNAP BAND KOVER 36X36 (MISCELLANEOUS) ×2 IMPLANT
CATH HEX JOSEPH 2-5-2 65CM 6F (CATHETERS) ×2 IMPLANT
CATH JOSEPH QUAD ALLRED 6F REP (CATHETERS) ×4 IMPLANT
CATH SMTCH THERMOCOOL SF DF (CATHETERS) ×2 IMPLANT
PACK EP LATEX FREE (CUSTOM PROCEDURE TRAY) ×3
PACK EP LF (CUSTOM PROCEDURE TRAY) ×1 IMPLANT
PAD PRO RADIOLUCENT 2001M-C (PAD) ×3 IMPLANT
PATCH CARTO3 (PAD) ×2 IMPLANT
SHEATH PINNACLE 6F 10CM (SHEATH) ×4 IMPLANT
SHEATH PINNACLE 7F 10CM (SHEATH) ×2 IMPLANT
SHEATH PINNACLE 8F 10CM (SHEATH) ×4 IMPLANT
SHIELD RADPAD SCOOP 12X17 (MISCELLANEOUS) ×2 IMPLANT
TUBING SMART ABLATE COOLFLOW (TUBING) ×2 IMPLANT
WIRE HI TORQ VERSACORE-J 145CM (WIRE) ×2 IMPLANT

## 2020-03-18 NOTE — Transfer of Care (Signed)
Immediate Anesthesia Transfer of Care Note  Patient: Angad [facility]  Procedure(s) Performed: PVC ABLATION (N/A )  Patient Location: Cath Lab  Anesthesia Type:MAC  Level of Consciousness: drowsy and patient cooperative  Airway & Oxygen Therapy: Patient Spontanous Breathing and Patient connected to nasal cannula oxygen  Post-op Assessment: Report given to RN and Post -op Vital signs reviewed and stable  Post vital signs: Reviewed and stable  Last Vitals:  Vitals Value Taken Time  BP    Temp    Pulse    Resp    SpO2      Last Pain:  Vitals:   03/18/20 0601  PainSc: 0-No pain      Patients Stated Pain Goal: 3 (74/08/14 4818)  Complications: No apparent anesthesia complications

## 2020-03-18 NOTE — Progress Notes (Signed)
Site area: right groin and L IJ  Site Prior to Removal:  Level 0  Pressure Applied For 32  MINUTES    Minutes Beginning at 1250  Manual:   Yes.    Patient Status During Pull:  Stable   Post Pull Groin Site:  Level 0  Post Pull Instructions Given:  Yes.    Post Pull Pulses Present:  Yes.    Dressing Applied:  Yes.    Comments:  Bed rest started at 1330 X 6 hr.

## 2020-03-18 NOTE — Anesthesia Postprocedure Evaluation (Signed)
Anesthesia Post Note  Patient: Tim Mccullough  Procedure(s) Performed: PVC ABLATION (N/A )     Patient location during evaluation: PACU Anesthesia Type: MAC Level of consciousness: awake and alert and awake Pain management: pain level controlled Vital Signs Assessment: post-procedure vital signs reviewed and stable Respiratory status: spontaneous breathing, nonlabored ventilation, respiratory function stable and patient connected to nasal cannula oxygen Cardiovascular status: stable and blood pressure returned to baseline Postop Assessment: no apparent nausea or vomiting Anesthetic complications: no    Last Vitals:  Vitals:   03/18/20 1145 03/18/20 1155  BP: 131/60 (!) 127/52  Pulse: 91 69  Resp: 17 13  Temp:    SpO2: 99% 98%    Last Pain:  Vitals:   03/18/20 1052  TempSrc: Temporal  PainSc: 0-No pain                 Catalina Gravel

## 2020-03-19 DIAGNOSIS — I5022 Chronic systolic (congestive) heart failure: Secondary | ICD-10-CM | POA: Diagnosis not present

## 2020-03-19 DIAGNOSIS — I471 Supraventricular tachycardia: Secondary | ICD-10-CM | POA: Diagnosis not present

## 2020-03-19 DIAGNOSIS — F329 Major depressive disorder, single episode, unspecified: Secondary | ICD-10-CM | POA: Diagnosis not present

## 2020-03-19 DIAGNOSIS — I493 Ventricular premature depolarization: Secondary | ICD-10-CM | POA: Diagnosis not present

## 2020-03-19 DIAGNOSIS — G4733 Obstructive sleep apnea (adult) (pediatric): Secondary | ICD-10-CM | POA: Diagnosis not present

## 2020-03-19 DIAGNOSIS — Z7984 Long term (current) use of oral hypoglycemic drugs: Secondary | ICD-10-CM | POA: Diagnosis not present

## 2020-03-19 DIAGNOSIS — E119 Type 2 diabetes mellitus without complications: Secondary | ICD-10-CM | POA: Diagnosis not present

## 2020-03-19 DIAGNOSIS — I110 Hypertensive heart disease with heart failure: Secondary | ICD-10-CM | POA: Diagnosis not present

## 2020-03-19 DIAGNOSIS — K219 Gastro-esophageal reflux disease without esophagitis: Secondary | ICD-10-CM | POA: Diagnosis not present

## 2020-03-19 DIAGNOSIS — Z79899 Other long term (current) drug therapy: Secondary | ICD-10-CM | POA: Diagnosis not present

## 2020-03-19 DIAGNOSIS — E785 Hyperlipidemia, unspecified: Secondary | ICD-10-CM | POA: Diagnosis not present

## 2020-03-19 DIAGNOSIS — E669 Obesity, unspecified: Secondary | ICD-10-CM | POA: Diagnosis not present

## 2020-03-19 DIAGNOSIS — Z7982 Long term (current) use of aspirin: Secondary | ICD-10-CM | POA: Diagnosis not present

## 2020-03-19 LAB — GLUCOSE, CAPILLARY: Glucose-Capillary: 122 mg/dL — ABNORMAL HIGH (ref 70–99)

## 2020-03-19 MED ORDER — DRONEDARONE HCL 400 MG PO TABS: 400.0000 mg | ORAL_TABLET | Freq: Two times a day (BID) | ORAL | 6 refills | Status: DC

## 2020-03-19 NOTE — Discharge Summary (Addendum)
ELECTROPHYSIOLOGY PROCEDURE DISCHARGE SUMMARY    Patient ID: Tim Mccullough,  MRN: [id number], DOB/AGE: [birth year] 69 y.o.  Admit date: 03/18/2020 Discharge date: 03/19/2020  Primary Care Physician: Tonia Ghent, MD  Primary Cardiologist: [name], MD  Electrophysiologist: Dr. Caryl Comes  Primary Discharge Diagnosis:  Frequent PVCs  Secondary Discharge Diagnosis:  Chronic systolic CHF Paroxysmal SVT  Procedures This Admission:  1.  Electrophysiology study and radiofrequency catheter ablation of PVCs on 03/19/2020 by Dr. Lovena Le.   This study demonstrated; "Invasive EP study with 3D mapping utilizing carto demonstrated PVCs originating from the patient's AV conduction system.  The PVCs were deep in the ventricular septum.  The earliest pace map match on the left ventricular endocardium was 85 to 90%.  RF applied to the left and right ventricular sides of the ventricular septum resulted in suppression of the patient's PVCs but failed to permanently suppress the PVCs."  Brief HPI: Tim Mccullough is a 69 y.o. male with a history of PVCs.  They have failed medical therapy with flecainide. Risks, benefits, and alternatives to catheter ablation of PVCs were reviewed with the patient who wished to proceed.    Hospital Course:  The patient was admitted and underwent EPS/RFCA of PVCs with details as outlined above.  They were monitored on telemetry overnight which demonstrated continued PVCs including periods of bigeminy. Pt started on multaq post procedure. Groin was without complication on the day of discharge.  The patient was examined and considered to be stable for discharge.  Wound care and restrictions were reviewed with the patient.  The patient will be seen back by Dr. Caryl Comes in 3-4 weeks.  Physical Exam: Vitals:   03/18/20 2358 03/19/20 0040 03/19/20 0141 03/19/20 0334  BP: 131/66 (!) 124/57 (!) 125/56 (!) 126/92  Pulse: 90 (!) 45 (!) 44   Resp: 20 14 14    Temp: 98 F (36.7  C)  98.3 F (36.8 C) 98.3 F (36.8 C)  TempSrc: Oral   Oral  SpO2: 93% 93% 96% 94%  Weight:    102.7 kg  Height:        GEN- The patient is well appearing, alert and oriented x 3 today.   HEENT: normocephalic, atraumatic; sclera clear, conjunctiva pink; hearing intact; oropharynx clear; neck supple  Lungs- Clear to ausculation bilaterally, normal work of breathing.  No wheezes, rales, rhonchi Heart- Regular rate and rhythm, no murmurs, rubs or gallops  GI- soft, non-tender, non-distended, bowel sounds present  Extremities- no clubbing, cyanosis, or edema; DP/PT/radial pulses 2+ bilaterally, groin without hematoma/bruit MS- no significant deformity or atrophy Skin- warm and dry, no rash or lesion Psych- euthymic mood, full affect Neuro- strength and sensation are intact   Labs:   Lab Results  Component Value Date   WBC 6.4 03/15/2020   HGB 13.1 03/15/2020   HCT 38.9 (L) 03/15/2020   MCV 93.5 03/15/2020   PLT 228 03/15/2020    Recent Labs  Lab 03/15/20 0924  NA 139  K 4.4  CL 104  CO2 27  BUN 27*  CREATININE 1.09  CALCIUM 8.7*  GLUCOSE 188*     Discharge Medications:  Allergies as of 03/19/2020   No Known Allergies     Medication List    TAKE these medications   aspirin 81 MG tablet Take 81 mg by mouth daily.   atorvastatin 40 MG tablet Commonly known as: LIPITOR TAKE 1 TABLET BY MOUTH EVERY DAY What changed:   how much to take  how to take this  when to take this   doxazosin 1 MG tablet Commonly known as: CARDURA 1 tablet daily What changed:   how much to take  how to take this  when to take this   dronedarone 400 MG tablet Commonly known as: MULTAQ Take 1 tablet (400 mg total) by mouth 2 (two) times daily with a meal.   fish oil-omega-3 fatty acids 1000 MG capsule Take 1 g by mouth 2 (two) times daily.   losartan 25 MG tablet Commonly known as: COZAAR Take 1 tablet (25 mg) by mouth once daily at bedtime   magnesium oxide 400  (241.3 Mg) MG tablet Commonly known as: MAG-OX Take 1 tablet (400 mg total) by mouth daily.   metFORMIN 500 MG tablet Commonly known as: GLUCOPHAGE TAKE 2 TABLETS (1,000 MG TOTAL) BY MOUTH 2 (TWO) TIMES DAILY WITH A MEAL.   metoprolol tartrate 25 MG tablet Commonly known as: LOPRESSOR Take 37.5 mg by mouth 2 (two) times daily.   omeprazole 20 MG capsule Commonly known as: PRILOSEC Take 1 capsule (20 mg total) by mouth daily.   ONE TOUCH ULTRA TEST test strip Generic drug: glucose blood CHECK BLOOD SUGAR DAILY AND IF BLOOD SUGAR FLUCTUATES. 250.00   PARoxetine 20 MG tablet Commonly known as: PAXIL Take 1 tablet (20 mg total) by mouth daily.   spironolactone 25 MG tablet Commonly known as: ALDACTONE Take 0.5 tablet (12.5 mg) by mouth once daily at bedtime What changed:   how much to take  how to take this  when to take this  additional instructions   triamcinolone ointment 0.1 % Commonly known as: KENALOG Apply 1 application topically 2 (two) times daily as needed (irritation).       Disposition:   Follow-up Information    Deboraha Sprang, MD Follow up on 04/11/2020.   Specialty: Cardiology Why: at 0900 for post PVC ablation follow up Contact information: [place] [id number] [phone]           Duration of Discharge Encounter: Greater than 30 minutes including physician time.  Signed, Shirley Friar, PA-C  03/19/2020 9:05 AM  PVCs deep within septum, affected by RF but could not be ablated-- may need bipolar ablation but for now will try dronaderone  Reviewed risks and benefits

## 2020-03-19 NOTE — Discharge Instructions (Signed)
Dronedarone tablets What is this medicine? DRONEDARONE (droe NE da rone) is an antiarrhythmic drug. It helps make your heart beat regularly. This medicine may be used for other purposes; ask your health care provider or pharmacist if you have questions. COMMON BRAND NAME(S): Multaq What should I tell my health care provider before I take this medicine? They need to know if you have any of these conditions:  heart failure  history of irregular heartbeat  liver disease  liver or lung problems with the past use of amiodarone  low levels of magnesium in the blood  low levels of potassium in the blood  other heart disease  an unusual or allergic reaction to dronedarone, other medicines, foods, dyes, or preservatives  pregnant or trying to get pregnant  breast-feeding How should I use this medicine? Take this medicine by mouth with a glass of water. Follow the directions on the prescription label. Take one tablet with the morning meal and one tablet with the evening meal. Do not take your medicine more often than directed. Do not stop taking except on the advice of your doctor or health care professional. A special MedGuide will be given to you by the pharmacist with each prescription and refill. Be sure to read this information carefully each time. Talk to your pediatrician regarding the use of this medicine in children. Special care may be needed. Overdosage: If you think you have taken too much of this medicine contact a poison control center or emergency room at once. NOTE: This medicine is only for you. Do not share this medicine with others. What if I miss a dose? If you miss a dose, take it as soon as you can. If it is almost time for your next dose, take only that dose. Do not take double or extra doses. What may interact with this medicine? Do not take this medicine with any of the following medications:  arsenic trioxide  certain antibiotics like clarithromycin,  erythromycin, pentamidine, telithromycin, troleandomycin  certain medicines for depression like tricyclic antidepressants  certain medicines for fungal infections like fluconazole, itraconazole, ketoconazole, posaconazole, voriconazole  certain medicines for irregular heart beat like amiodarone, disopyramide, flecainide, ibutilide, quinidine, propafenone, sotalol  certain medicines for malaria like chloroquine, halofantrine  cisapride  cyclosporine  droperidol  haloperidol  methadone  other medicines that prolong the QT interval (cause an abnormal heart rhythm)  pimozide  nefazodone  phenothiazines like chlorpromazine, mesoridazine, prochlorperazine, thioridazine  ritonavir  ziprasidone This medicine may also interact with the following medications:  certain medicines for blood pressure, heart disease, or irregular heart beat like diltiazem, metoprolol, propranolol, verapamil  certain medicines for cholesterol like atorvastatin, lovastatin, simvastatin  certain medicines for seizures like carbamazepine, phenobarbital, phenytoin  digoxin  dofetilide  grapefruit juice  rifampin  sirolimus  St. John's Wort  tacrolimus This list may not describe all possible interactions. Give your health care provider a list of all the medicines, herbs, non-prescription drugs, or dietary supplements you use. Also tell them if you smoke, drink alcohol, or use illegal drugs. Some items may interact with your medicine. What should I watch for while using this medicine? Your condition will be monitored closely when you first begin therapy. Often, this drug is first started in a hospital or other monitored health care setting. Once you are on maintenance therapy, visit your doctor or health care professional for regular checks on your progress. Because your condition and use of this medicine carry some risk, it is a good idea to  carry an identification card, necklace or bracelet with  details of your condition, medications, and doctor or health care professional. Dennis Bast may get drowsy or dizzy. Do not drive, use machinery, or do anything that needs mental alertness until you know how this medicine affects you. Do not stand or sit up quickly, especially if you are an older patient. This reduces the risk of dizzy or fainting spells. What side effects may I notice from receiving this medicine? Side effects that you should report to your doctor or health care professional as soon as possible:  allergic reactions like skin rash, itching or hives, swelling of the face, lips, or tongue  breathing problems  cough  dark urine  fast, irregular heartbeat  general ill feeling or flu-like symptoms  light-colored stools  loss of appetite, nausea  right upper belly pain  slow heartbeat  stomach pain  swelling of the legs or ankles  unusually weak or tired  weight gain  yellowing of the eyes or skin Side effects that usually do not require medical attention (report to your doctor or health care professional if they continue or are bothersome):  nausea  vomiting  stomach pain This list may not describe all possible side effects. Call your doctor for medical advice about side effects. You may report side effects to FDA at 1-800-FDA-1088. Where should I keep my medicine? Keep out of the reach of children. Store at room temperature between 15 and 30 degrees C (59 and 86 degrees F). Throw away any unused medicine after the expiration date. NOTE: This sheet is a summary. It may not cover all possible information. If you have questions about this medicine, talk to your doctor, pharmacist, or health care provider.  2020 [employer] Standard (2018-10-17 10:43:10) Cardiac Ablation, Care After  This sheet gives you information about how to care for yourself after your procedure. Your health care provider may also give you more specific instructions. If you have problems or  questions, contact your health care provider. What can I expect after the procedure? After the procedure, it is common to have:  Bruising around your puncture site.  Tenderness around your puncture site.  Skipped heartbeats.  Tiredness (fatigue).  Follow these instructions at home: Puncture site care   Follow instructions from your health care provider about how to take care of your puncture site. Make sure you: ? If present, leave stitches (sutures), skin glue, or adhesive strips in place. These skin closures may need to stay in place for up to 2 weeks. If adhesive strip edges start to loosen and curl up, you may trim the loose edges. Do not remove adhesive strips completely unless your health care provider tells you to do that.  Check your puncture site every day for signs of infection. Check for: ? Redness, swelling, or pain. ? Fluid or blood. If your puncture site starts to bleed, lie down on your back, apply firm pressure to the area, and contact your health care provider. ? Warmth. ? Pus or a bad smell. Driving  Do not drive for at least 4 days after your procedure or however long your health care provider recommends. (Do not resume driving if you have previously been instructed not to drive for other health reasons.)  Do not drive or use heavy machinery while taking prescription pain medicine. Activity  Avoid activities that take a lot of effort for at least 7 days after your procedure.  Do not lift anything that is heavier than 5 lb (4.5  kg) for one week.   No sexual activity for 1 week.   Return to your normal activities as told by your health care provider. Ask your health care provider what activities are safe for you. General instructions  Take over-the-counter and prescription medicines only as told by your health care provider.  Do not use any products that contain nicotine or tobacco, such as cigarettes and e-cigarettes. If you need help quitting, ask your  health care provider.  You may shower after 24 hours, but Do not take baths, swim, or use a hot tub for 1 week.   Do not drink alcohol for 24 hours after your procedure.  Keep all follow-up visits as told by your health care provider. This is important. Contact a health care provider if:  You have redness, mild swelling, or pain around your puncture site.  You have fluid or blood coming from your puncture site that stops after applying firm pressure to the area.  Your puncture site feels warm to the touch.  You have pus or a bad smell coming from your puncture site.  You have a fever.  You have chest pain or discomfort that spreads to your neck, jaw, or arm.  You are sweating a lot.  You feel nauseous.  You have a fast or irregular heartbeat.  You have shortness of breath.  You are dizzy or light-headed and feel the need to lie down.  You have pain or numbness in the arm or leg closest to your puncture site. Get help right away if:  Your puncture site suddenly swells.  Your puncture site is bleeding and the bleeding does not stop after applying firm pressure to the area. These symptoms may represent a serious problem that is an emergency. Do not wait to see if the symptoms will go away. Get medical help right away. Call your local emergency services (911 in the [place] Do not drive yourself to the hospital. Summary  After the procedure, it is normal to have bruising and tenderness at the puncture site in your groin, neck, or forearm.  Check your puncture site every day for signs of infection.  Get help right away if your puncture site is bleeding and the bleeding does not stop after applying firm pressure to the area. This is a medical emergency. This information is not intended to replace advice given to you by your health care provider. Make sure you discuss any questions you have with your health care provider.

## 2020-03-20 NOTE — Interval H&P Note (Signed)
History and Physical Interval Note:  03/20/2020 9:20 PM  Harrells  has presented today for surgery, with the diagnosis of SVT/PVC.  The various methods of treatment have been discussed with the patient and family. After consideration of risks, benefits and other options for treatment, the patient has consented to  Procedure(s): PVC ABLATION (N/A) as a surgical intervention.  The patient's history has been reviewed, patient examined, no change in status, stable for surgery.  I have reviewed the patient's chart and labs.  Questions were answered to the patient's satisfaction.     Cristopher Peru

## 2020-03-27 ENCOUNTER — Encounter: Payer: Self-pay | Admitting: *Deleted

## 2020-03-27 DIAGNOSIS — I5022 Chronic systolic (congestive) heart failure: Secondary | ICD-10-CM

## 2020-03-27 NOTE — Progress Notes (Signed)
Cardiac Individual Treatment Plan  Patient Details  Name: Tim Mccullough MRN: [id number] Date of Birth: May 02, 1951 Referring Provider:     Cardiac Rehab from 01/08/2020 in [facility] Cardiac and Pulmonary Rehab  Referring Provider  [name] MD      Initial Encounter Date:    Cardiac Rehab from 01/08/2020 in [facility] Cardiac and Pulmonary Rehab  Date  01/08/20      Visit Diagnosis: Heart failure, chronic systolic ([place])  Patient's Home Medications on Admission:  Current Outpatient Medications:  .  aspirin 81 MG tablet, Take 81 mg by mouth daily. , Disp: , Rfl:  .  atorvastatin (LIPITOR) 40 MG tablet, TAKE 1 TABLET BY MOUTH EVERY DAY (Patient taking differently: Take 40 mg by mouth daily. TAKE 1 TABLET BY MOUTH EVERY DAY), Disp: 90 tablet, Rfl: 3 .  doxazosin (CARDURA) 1 MG tablet, 1 tablet daily (Patient taking differently: Take 1 mg by mouth daily. 1 tablet daily), Disp: 90 tablet, Rfl: 3 .  dronedarone (MULTAQ) 400 MG tablet, Take 1 tablet (400 mg total) by mouth 2 (two) times daily with a meal., Disp: 60 tablet, Rfl: 6 .  fish oil-omega-3 fatty acids 1000 MG capsule, Take 1 g by mouth 2 (two) times daily. , Disp: , Rfl:  .  losartan (COZAAR) 25 MG tablet, Take 1 tablet (25 mg) by mouth once daily at bedtime, Disp: , Rfl:  .  magnesium oxide (MAG-OX) 400 (241.3 Mg) MG tablet, Take 1 tablet (400 mg total) by mouth daily., Disp: 30 tablet, Rfl: 0 .  metFORMIN (GLUCOPHAGE) 500 MG tablet, TAKE 2 TABLETS (1,000 MG TOTAL) BY MOUTH 2 (TWO) TIMES DAILY WITH A MEAL., Disp: 360 tablet, Rfl: 3 .  metoprolol tartrate (LOPRESSOR) 25 MG tablet, Take 37.5 mg by mouth 2 (two) times daily., Disp: , Rfl:  .  omeprazole (PRILOSEC) 20 MG capsule, Take 1 capsule (20 mg total) by mouth daily., Disp: 30 capsule, Rfl: 11 .  ONE TOUCH ULTRA TEST test strip, CHECK BLOOD SUGAR DAILY AND IF BLOOD SUGAR FLUCTUATES. 250.00, Disp: 100 each, Rfl: 11 .  PARoxetine (PAXIL) 20 MG tablet, Take 1 tablet (20 mg total) by mouth  daily., Disp: 90 tablet, Rfl: 3 .  spironolactone (ALDACTONE) 25 MG tablet, Take 0.5 tablet (12.5 mg) by mouth once daily at bedtime (Patient taking differently: Take 25 mg by mouth daily. ), Disp: , Rfl:  .  triamcinolone ointment (KENALOG) 0.1 %, Apply 1 application topically 2 (two) times daily as needed (irritation). , Disp: , Rfl:   Past Medical History: Past Medical History:  Diagnosis Date  . Abnormal echocardiogram 2/11   Repeated after med mgmt of cardiomyopathy showed EF 40-45%, mildly dilated LV, mild LV hypertrophy, anterolateral and apical hypokinesis, mild diastolic dysfunction.  . Anemia    hx  . Arthritis   . Blood transfusion without reported diagnosis 09/2012  . BPH (benign prostatic hypertrophy) 2004  . Cancer ([place])    melanoma back  . Cardiomyopathy    ? tachycardia-mediated  . Depression   . Diabetes mellitus 11/2006   Type II  . Diverticulosis   . Echocardiogram abnormal 7/10   Mild LVH with a mildly dilated LV.  EF was difficult to assess given poor acoustic windows but appeared moderately decreased.  Mild MR.  Probable small perimembranous VSD, mild LAE  . Esophageal erosions   . GERD (gastroesophageal reflux disease)   . Hip pain   . History of MRI 7/10   Cardiac MRI was done to  followup difficult echo.  This showed mild to moderately dilated LV, mild to moderate LAE, EF  33% with global hypokinesis, normal RV size with mild systolic dysfunction.  There was no large VSD evident.  There was no myocardial delayed enhancement so no evidence for infiltrative disease or infarction.  LHC (7/10) showed only luminal irreg. in the coronaries and EF 35%  . Holter monitor, abnormal 7/10   Frequent PAC's and PVC's  Average HR 96  (but he had started Toprol XL).  HR range 71-130  . Hyperlipidemia 04/1998  . Hypertension 1995  . Obesity   . OSA (obstructive sleep apnea)    Not using CPAP- improved with weight loss  . Pneumonia    HX OF pna  . Sinus tachycardia    Pt  says he has been told his heart rate is high since he was in high school.   . Sleep apnea    has cpap; but does not use  . VSD (ventricular septal defect)    Probable small perimembranous    Tobacco Use: Social History   Tobacco Use  Smoking Status Former Smoker  . Years: 20.00  . Types: Cigarettes  . Quit date: 11/09/1998  . Years since quitting: 21.3  Smokeless Tobacco Former Systems developer  . Quit date: 06/03/2007  Tobacco Comment   no alcohol since nov 12    Labs: Recent Review Flowsheet Data    Labs for ITP Cardiac and Pulmonary Rehab Latest Ref Rng & Units 01/20/2019 04/26/2019 08/21/2019 11/28/2019 01/29/2020   Cholestrol 0 - 200 mg/dL - 246(H) 142 - -   LDLCALC 0 - 99 mg/dL - 169(H) 76 - -   LDLDIRECT mg/dL - - - - -   HDL >39.00 mg/dL - 43.40 38.70(L) - -   Trlycerides 0.0 - 149.0 mg/dL - 172.0(H) 137.0 - -   Hemoglobin A1c 4.8 - 5.6 % 6.9(A) 7.0(H) 7.0(H) 6.8(H) 6.7(H)   PHART 7.350 - 7.450 - - - - -   PCO2ART 35.0 - 45.0 mmHg - - - - -   HCO3 20.0 - 24.0 mEq/L - - - - -   TCO2 0 - 100 mmol/L - - - - -   O2SAT % - - - - -       Exercise Target Goals: Exercise Program Goal: Individual exercise prescription set using results from initial 6 min walk test and THRR while considering  patient's activity barriers and safety.   Exercise Prescription Goal: Initial exercise prescription builds to 30-45 minutes a day of aerobic activity, 2-3 days per week.  Home exercise guidelines will be given to patient during program as part of exercise prescription that the participant will acknowledge.   Education: Aerobic Exercise & Resistance Training: - Gives group verbal and written instruction on the various components of exercise. Focuses on aerobic and resistive training programs and the benefits of this training and how to safely progress through these programs..   Education: Exercise & Equipment Safety: - Individual verbal instruction and demonstration of equipment use and safety  with use of the equipment.   Cardiac Rehab from 01/08/2020 in [facility] Cardiac and Pulmonary Rehab  Date  01/08/20  Educator  [facility]  Instruction Review Code  1- Verbalizes Understanding      Education: Exercise Physiology & General Exercise Guidelines: - Group verbal and written instruction with models to review the exercise physiology of the cardiovascular system and associated critical values. Provides general exercise guidelines with specific guidelines to those with heart or  lung disease.    Education: Flexibility, Balance, Mind/Body Relaxation: Provides group verbal/written instruction on the benefits of flexibility and balance training, including mind/body exercise modes such as yoga, pilates and tai chi.  Demonstration and skill practice provided.   Activity Barriers & Risk Stratification: Activity Barriers & Cardiac Risk Stratification - 01/08/20 1102      Activity Barriers & Cardiac Risk Stratification   Activity Barriers  Joint Problems;Back Problems;Left Hip Replacement;Right Knee Replacement;Deconditioning;Muscular Weakness    Cardiac Risk Stratification  High       6 Minute Walk: 6 Minute Walk    Row Name 01/08/20 1101         6 Minute Walk   Phase  Initial     Distance  1163 feet     Walk Time  6 minutes     # of Rest Breaks  0     MPH  2.2     METS  2.71     RPE  11     VO2 Peak  9.49     Symptoms  Yes (comment)     Comments  Left hip starting to be bothersome at end 6/10     Resting HR  91 bpm     Resting BP  126/70     Resting Oxygen Saturation   96 %     Exercise Oxygen Saturation  during 6 min walk  93 %     Max Ex. HR  123 bpm     Max Ex. BP  142/70     2 Minute Post BP  130/60        Oxygen Initial Assessment:   Oxygen Re-Evaluation:   Oxygen Discharge (Final Oxygen Re-Evaluation):   Initial Exercise Prescription: Initial Exercise Prescription - 01/08/20 1100      Date of Initial Exercise RX and Referring Provider   Date  01/08/20     Referring Provider  [name] MD      Treadmill   MPH  2.2    Grade  0    Minutes  15    METs  2.68      Recumbant Elliptical   Level  1    RPM  50    Minutes  15    METs  2.7      Elliptical   Level  1    Speed  2.5    Minutes  15      REL-XR   Level  2    Speed  50    Minutes  15    METs  2.7      Prescription Details   Frequency (times per week)  3    Duration  Progress to 30 minutes of continuous aerobic without signs/symptoms of physical distress      Intensity   THRR 40-80% of Max Heartrate  115-139    Ratings of Perceived Exertion  11-13    Perceived Dyspnea  0-4      Progression   Progression  Continue to progress workloads to maintain intensity without signs/symptoms of physical distress.      Resistance Training   Training Prescription  Yes    Weight  4 lb       Perform Capillary Blood Glucose checks as needed.  Exercise Prescription Changes: Exercise Prescription Changes    Row Name 01/08/20 1100 01/24/20 1300 02/07/20 1500 02/22/20 1200 03/05/20 1400     Response to Exercise   Blood Pressure (Admit)  126/70  118/68  134/70  104/56  102/62   Blood Pressure (Exercise)  142/74  128/74  134/64  128/60  124/68   Blood Pressure (Exit)  130/60  124/70  124/64  118/60  100/62   Heart Rate (Admit)  97 bpm  94 bpm  101 bpm  90 bpm  99 bpm   Heart Rate (Exercise)  123 bpm  105 bpm  121 bpm  133 bpm  134 bpm   Heart Rate (Exit)  94 bpm  97 bpm  102 bpm  101 bpm  104 bpm   Oxygen Saturation (Admit)  96 %  --  --  --  --   Oxygen Saturation (Exercise)  93 %  --  --  --  --   Rating of Perceived Exertion (Exercise)  '11  13  13  13  14   ' Perceived Dyspnea (Exercise)  0  --  --  --  --   Symptoms  hip bothersome at end 6/10  none  none  none  none   Comments  walk test results  --  --  --  --   Duration  --  Continue with 30 min of aerobic exercise without signs/symptoms of physical distress.  Continue with 30 min of aerobic exercise without  signs/symptoms of physical distress.  Continue with 30 min of aerobic exercise without signs/symptoms of physical distress.  Continue with 30 min of aerobic exercise without signs/symptoms of physical distress.   Intensity  --  THRR unchanged  THRR unchanged  THRR unchanged  THRR unchanged     Progression   Progression  --  Continue to progress workloads to maintain intensity without signs/symptoms of physical distress.  Continue to progress workloads to maintain intensity without signs/symptoms of physical distress.  Continue to progress workloads to maintain intensity without signs/symptoms of physical distress.  Continue to progress workloads to maintain intensity without signs/symptoms of physical distress.   Average METs  --  2.77  3.06  3.3  3.45     Resistance Training   Training Prescription  --  Yes  Yes  Yes  Yes   Weight  --  4 lb  4 lb  4 lb  4 lb   Reps  --  10-15  10-15  10-15  10-15     Interval Training   Interval Training  --  No  No  No  No     Treadmill   MPH  --  2.2  2.2  2.2  2.2   Grade  --  0  0  0  2   Minutes  --  '15  15  15  15   ' METs  --  2.68  2.68  2.68  3.3     Recumbant Bike   Level  --  4  2.5  4  --   Minutes  --  '15  15  15  ' --   METs  --  3.7  3.6  4.4  --     NuStep   Level  --  '6  6  6  ' --   Minutes  --  '15  15  15  ' --   METs  --  2.5  2.9  3.3  --     Recumbant Elliptical   Level  --  2  --  --  --   Minutes  --  15  --  --  --   METs  --  2.2  --  --  --  REL-XR   Level  --  --  '1  6  4   ' Speed  --  --  3  --  --   Minutes  --  --  '15  15  15   ' METs  --  --  --  2.8  3.6     Home Exercise Plan   Plans to continue exercise at  --  Home (comment) walking  Home (comment) walking  Home (comment) walking  Home (comment) walking   Frequency  --  Add 2 additional days to program exercise sessions.  Add 2 additional days to program exercise sessions.  Add 2 additional days to program exercise sessions.  Add 2 additional days to program  exercise sessions.   Initial Home Exercises Provided  --  01/12/20  01/12/20  01/12/20  01/12/20   Row Name 03/18/20 1000             Response to Exercise   Blood Pressure (Admit)  102/60       Blood Pressure (Exercise)  132/58       Blood Pressure (Exit)  122/64       Heart Rate (Admit)  95 bpm       Heart Rate (Exercise)  134 bpm       Heart Rate (Exit)  121 bpm       Rating of Perceived Exertion (Exercise)  13       Symptoms  none       Duration  Continue with 30 min of aerobic exercise without signs/symptoms of physical distress.       Intensity  THRR unchanged         Progression   Progression  Continue to progress workloads to maintain intensity without signs/symptoms of physical distress.       Average METs  3.61         Resistance Training   Training Prescription  Yes       Weight  4 lb       Reps  10-15         Interval Training   Interval Training  No         Treadmill   MPH  2.5       Grade  3.5       Minutes  15       METs  4.12         Recumbant Bike   Level  4       Minutes  15       METs  4.3         NuStep   Level  6       Minutes  15       METs  3         REL-XR   Level  6       Minutes  15       METs  3         Home Exercise Plan   Plans to continue exercise at  Home (comment) walking       Frequency  Add 2 additional days to program exercise sessions.       Initial Home Exercises Provided  01/12/20          Exercise Comments: Exercise Comments    Row Name 01/10/20 1323           Exercise Comments  First full day of exercise!  Patient was oriented to gym and  equipment including functions, settings, policies, and procedures.  Patient's individual exercise prescription and treatment plan were reviewed.  All starting workloads were established based on the results of the 6 minute walk test done at initial orientation visit.  The plan for exercise progression was also introduced and progression will be customized based on patient's  performance and goals.          Exercise Goals and Review: Exercise Goals    Row Name 01/08/20 1255             Exercise Goals   Increase Physical Activity  Yes       Intervention  Provide advice, education, support and counseling about physical activity/exercise needs.;Develop an individualized exercise prescription for aerobic and resistive training based on initial evaluation findings, risk stratification, comorbidities and participant's personal goals.       Expected Outcomes  Short Term: Attend rehab on a regular basis to increase amount of physical activity.;Long Term: Add in home exercise to make exercise part of routine and to increase amount of physical activity.;Long Term: Exercising regularly at least 3-5 days a week.       Increase Strength and Stamina  Yes       Intervention  Provide advice, education, support and counseling about physical activity/exercise needs.;Develop an individualized exercise prescription for aerobic and resistive training based on initial evaluation findings, risk stratification, comorbidities and participant's personal goals.       Expected Outcomes  Short Term: Increase workloads from initial exercise prescription for resistance, speed, and METs.;Short Term: Perform resistance training exercises routinely during rehab and add in resistance training at home;Long Term: Improve cardiorespiratory fitness, muscular endurance and strength as measured by increased METs and functional capacity (6MWT)       Able to understand and use rate of perceived exertion (RPE) scale  Yes       Intervention  Provide education and explanation on how to use RPE scale       Expected Outcomes  Short Term: Able to use RPE daily in rehab to express subjective intensity level;Long Term:  Able to use RPE to guide intensity level when exercising independently       Able to understand and use Dyspnea scale  Yes       Intervention  Provide education and explanation on how to use Dyspnea  scale       Expected Outcomes  Short Term: Able to use Dyspnea scale daily in rehab to express subjective sense of shortness of breath during exertion;Long Term: Able to use Dyspnea scale to guide intensity level when exercising independently       Knowledge and understanding of Target Heart Rate Range (THRR)  Yes       Intervention  Provide education and explanation of THRR including how the numbers were predicted and where they are located for reference       Expected Outcomes  Short Term: Able to use daily as guideline for intensity in rehab;Short Term: Able to state/look up THRR;Long Term: Able to use THRR to govern intensity when exercising independently       Able to check pulse independently  Yes       Intervention  Provide education and demonstration on how to check pulse in carotid and radial arteries.;Review the importance of being able to check your own pulse for safety during independent exercise       Expected Outcomes  Short Term: Able to explain why pulse checking is important during independent exercise;Long Term:  Able to check pulse independently and accurately       Understanding of Exercise Prescription  Yes       Intervention  Provide education, explanation, and written materials on patient's individual exercise prescription       Expected Outcomes  Short Term: Able to explain program exercise prescription;Long Term: Able to explain home exercise prescription to exercise independently          Exercise Goals Re-Evaluation : Exercise Goals Re-Evaluation    Row Name 01/10/20 1323 01/18/20 1337 01/24/20 1326 02/05/20 1311 02/22/20 1212     Exercise Goal Re-Evaluation   Exercise Goals Review  Able to understand and use rate of perceived exertion (RPE) scale;Knowledge and understanding of Target Heart Rate Range (THRR);Understanding of Exercise Prescription  Increase Physical Activity;Increase Strength and Stamina;Understanding of Exercise Prescription  Increase Physical  Activity;Increase Strength and Stamina;Understanding of Exercise Prescription  Increase Physical Activity;Increase Strength and Stamina;Understanding of Exercise Prescription  Increase Physical Activity;Increase Strength and Stamina;Understanding of Exercise Prescription   Comments  Reviewed RPE scale, THR and program prescription with pt today.  Pt voiced understanding and was given a copy of goals to take home.  Tim Mccullough is off to a good start in rehab.  He is walking in the drive way now and plans to start gardening some more.  Reviewed home exercise with pt today.  Pt plans to walk at home for exercise.  Reviewed THR, pulse, RPE, sign and symptoms, NTG use, and when to call 911 or MD.  Also discussed weather considerations and indoor options.  Pt voiced understanding.  Tim Mccullough is doing well in rehab.  He is up to level 6 on the NuStep. We will continue to monitor his progress.  Tim Mccullough is doing well in rehab.  He usually is active at home and gets to exercise on his off days.  Other than his recently heart problems, his stamina is getting better.  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  Tim Mccullough is doing well in rehab.  He is now on level 6 on the XR!  We will continue to monitor his progress.   Expected Outcomes  Short: Use RPE daily to regulate intensity. Long: Follow program prescription in THR.  Short: Start to add in more walking  Long: Continue to build stamina.  Short: Add incline on treadmill  Long: Continue to add in walking at home.  Short: Continue to monitor HR  Long: Continue to walk on off days.  Short: Try to add some incline on treadmill  Long: Continue to improve stamina.   [place] Name 03/05/20 1441 03/14/20 1330 03/18/20 1053         Exercise Goal Re-Evaluation   Exercise Goals Review  Increase Physical Activity;Increase Strength and Stamina;Able to understand and use rate of perceived exertion (RPE) scale;Able to understand and use Dyspnea scale;Knowledge and  understanding of Target Heart Rate Range (THRR);Able to check pulse independently;Understanding of Exercise Prescription  Increase Physical Activity;Increase Strength and Stamina;Knowledge and understanding of Target Heart Rate Range (THRR)  Increase Physical Activity;Increase Strength and Stamina;Understanding of Exercise Prescription     Comments  Tim Mccullough has progressed to 2.2 mph and 2 % incline on TM. He works in [employer] range. Staff will monitor progress.  Tim Mccullough is walking at home for exercise. Informed him that he is going to need to exercise when he is done with the program. He understands that he needs to keep his heart rate up for a time when  he is walking. He is going to start checking his heart rate with his pulse oximeter at home. Verbalizes understanding of target heart rate.  Tim Mccullough is doing well in rehab.  He is up to level 6 on the XR.  We will continue to montior his progress.     Expected Outcomes  Short:  continue to increase workload Long : increase MET level  Short: use pulse ox at home to monitor heart rate. Long: maintain vital signs at home independently  Short: Try to increase handweights  Long: Continue to improve stamina.        Discharge Exercise Prescription (Final Exercise Prescription Changes): Exercise Prescription Changes - 03/18/20 1000      Response to Exercise   Blood Pressure (Admit)  102/60    Blood Pressure (Exercise)  132/58    Blood Pressure (Exit)  122/64    Heart Rate (Admit)  95 bpm    Heart Rate (Exercise)  134 bpm    Heart Rate (Exit)  121 bpm    Rating of Perceived Exertion (Exercise)  13    Symptoms  none    Duration  Continue with 30 min of aerobic exercise without signs/symptoms of physical distress.    Intensity  THRR unchanged      Progression   Progression  Continue to progress workloads to maintain intensity without signs/symptoms of physical distress.    Average METs  3.61      Resistance Training   Training Prescription  Yes    Weight  4  lb    Reps  10-15      Interval Training   Interval Training  No      Treadmill   MPH  2.5    Grade  3.5    Minutes  15    METs  4.12      Recumbant Bike   Level  4    Minutes  15    METs  4.3      NuStep   Level  6    Minutes  15    METs  3      REL-XR   Level  6    Minutes  15    METs  3      Home Exercise Plan   Plans to continue exercise at  Home (comment)   walking   Frequency  Add 2 additional days to program exercise sessions.    Initial Home Exercises Provided  01/12/20       Nutrition:  Target Goals: Understanding of nutrition guidelines, daily intake of sodium <1557m, cholesterol <2088m calories 30% from fat and 7% or less from saturated fats, daily to have 5 or more servings of fruits and vegetables.  Education: Controlling Sodium/Reading Food Labels -Group verbal and written material supporting the discussion of sodium use in heart healthy nutrition. Review and explanation with models, verbal and written materials for utilization of the food label.   Education: General Nutrition Guidelines/Fats and Fiber: -Group instruction provided by verbal, written material, models and posters to present the general guidelines for heart healthy nutrition. Gives an explanation and review of dietary fats and fiber.   Biometrics: Pre Biometrics - 01/08/20 1257      Pre Biometrics   Height  5' 10.4" (1.788 m)    Weight  241 lb 11.2 oz (109.6 kg)    BMI (Calculated)  34.29    Single Leg Stand  26.06 seconds        Nutrition Therapy  Plan and Nutrition Goals: Nutrition Therapy & Goals - 02/20/20 1300      Nutrition Therapy   Diet  HH, Low Na, DM    Drug/Food Interactions  Statins/Certain Fruits    Protein (specify units)  85-90g    Fiber  30 grams    Whole Grain Foods  3 servings    Saturated Fats  12 max. grams    Fruits and Vegetables  5 servings/day    Sodium  1.5 grams      Personal Nutrition Goals   Nutrition Goal  ST: continue with current  changes LT: lose 30lbs (10 lbs so so far), Getting back into shape    Comments  B: couple boiled eggs with fruit and protein shake L or S: chips with salsa, broccoli with ranch dressing, or carrot chips D: chicken with cauliflower rice. BG: ~150 randomly, A1c ~6.7. Discussed HH and DM friendly eating, CHOs, honoring hunger, tips for more satiating and satisfying meals.      Intervention Plan   Intervention  Prescribe, educate and counsel regarding individualized specific dietary modifications aiming towards targeted core components such as weight, hypertension, lipid management, diabetes, heart failure and other comorbidities.;Nutrition handout(s) given to patient.    Expected Outcomes  Short Term Goal: Understand basic principles of dietary content, such as calories, fat, sodium, cholesterol and nutrients.;Short Term Goal: A plan has been developed with personal nutrition goals set during dietitian appointment.;Long Term Goal: Adherence to prescribed nutrition plan.       Nutrition Assessments: Nutrition Assessments - 01/08/20 1259      MEDFICTS Scores   Pre Score  72       MEDIFICTS Score Key:          ?70 Need to make dietary changes          40-70 Heart Healthy Diet         ? 40 Therapeutic Level Cholesterol Diet  Nutrition Goals Re-Evaluation:   Nutrition Goals Discharge (Final Nutrition Goals Re-Evaluation):   Psychosocial: Target Goals: Acknowledge presence or absence of significant depression and/or stress, maximize coping skills, provide positive support system. Participant is able to verbalize types and ability to use techniques and skills needed for reducing stress and depression.   Education: Depression - Provides group verbal and written instruction on the correlation between heart/lung disease and depressed mood, treatment options, and the stigmas associated with seeking treatment.   Education: Sleep Hygiene -Provides group verbal and written instruction about how  sleep can affect your health.  Define sleep hygiene, discuss sleep cycles and impact of sleep habits. Review good sleep hygiene tips.     Education: Stress and Anxiety: - Provides group verbal and written instruction about the health risks of elevated stress and causes of high stress.  Discuss the correlation between heart/lung disease and anxiety and treatment options. Review healthy ways to manage with stress and anxiety.    Initial Review & Psychosocial Screening: Initial Psych Review & Screening - 01/04/20 1040      Initial Review   Current issues with  Current Stress Concerns    Source of Stress Concerns  Chronic Illness      Family Dynamics   Good Support System?  Yes   daughter, grandaughters     Barriers   Psychosocial barriers to participate in program  There are no identifiable barriers or psychosocial needs.;The patient should benefit from training in stress management and relaxation.      Screening Interventions   Interventions  Provide  feedback about the scores to participant;Encouraged to exercise;To provide support and resources with identified psychosocial needs    Expected Outcomes  Short Term goal: Utilizing psychosocial counselor, staff and physician to assist with identification of specific Stressors or current issues interfering with healing process. Setting desired goal for each stressor or current issue identified.;Long Term Goal: Stressors or current issues are controlled or eliminated.;Short Term goal: Identification and review with participant of any Quality of Life or Depression concerns found by scoring the questionnaire.;Long Term goal: The participant improves quality of Life and PHQ9 Scores as seen by post scores and/or verbalization of changes       Quality of Life Scores:  Quality of Life - 01/08/20 1258      Quality of Life   Select  Quality of Life      Quality of Life Scores   Health/Function Pre  17.03 %    Socioeconomic Pre  20.79 %     Psych/Spiritual Pre  20.71 %    Family Pre  27 %    GLOBAL Pre  19.82 %      Scores of 19 and below usually indicate a poorer quality of life in these areas.  A difference of  2-3 points is a clinically meaningful difference.  A difference of 2-3 points in the total score of the Quality of Life Index has been associated with significant improvement in overall quality of life, self-image, physical symptoms, and general health in studies assessing change in quality of life.  PHQ-9: Recent Review Flowsheet Data    Depression screen [facility] 2/9 03/14/2020 02/05/2020 01/08/2020 04/26/2019 04/08/2018   Decreased Interest '2 2  1 ' 0 0   Down, Depressed, Hopeless 0 0 0 0 0   PHQ - 2 Score '2 2 1 ' 0 0   Altered sleeping 0 1 1 0 0   Tired, decreased energy '3 3 2 ' 0 0   Change in appetite 0 0 1 0 0   Feeling bad or failure about yourself  0 0 0 0 0   Trouble concentrating 0 0 0 0 0   Moving slowly or fidgety/restless 0 0 0 0 0   Suicidal thoughts 0 0 0 0 0   PHQ-9 Score '5 6 5 ' 0 0   Difficult doing work/chores Not difficult at all Somewhat difficult Somewhat difficult Not difficult at all Not difficult at all     Interpretation of Total Score  Total Score Depression Severity:  1-4 = Minimal depression, 5-9 = Mild depression, 10-14 = Moderate depression, 15-19 = Moderately severe depression, 20-27 = Severe depression   Psychosocial Evaluation and Intervention: Psychosocial Evaluation - 01/04/20 1044      Psychosocial Evaluation & Interventions   Comments  Tim Mccullough reports doing well. His daughter and her two daughters live with him. The issue causing him the most stress right now is the cost of Entresto. He knows this medicine will help him a lot, it is just hard coming up with the additional $400-$500 a month. He is working with his doctor's office and pharmacy to find a plan that is more affordable. This issue currently has caused some sleep issues, but he knows it will all work out in the end. He is looking  forward to coming to cardiac rehab because he really wants to work hard to feel better and have the best quality of life.    Expected Outcomes  Short: attend [facility] for exercise and education. Long: develope positive self care habits  Continue Psychosocial Services   Follow up required by staff       Psychosocial Re-Evaluation: Psychosocial Re-Evaluation    Row Name 01/18/20 1338 02/05/20 1308 03/14/20 1340         Psychosocial Re-Evaluation   Current issues with  Current Stress Concerns  Current Stress Concerns;Current Depression  Current Stress Concerns     Comments  Tim Mccullough is doing well mentally and in a good place.  He has really enjoyed coming to class and feeling better overall.  He forgot to call court about his jury duty miss.  He just has a lot on his mind.  Tim Mccullough is doing well mentally.  He got a little scared last week after a bout of afib.  It got him not feeling well either.  It has increased his PHQ by a point, but feels good otherwise.  Patient has to go get a procedure done on his heart. He is able to see family and go fishing and do things that he enjoys. He is getting an ablation done on Monday.     Expected Outcomes  Short: Continue to attend rehab.  Long: Continue to stay positive.  Short: Get heart back in rhythm.  Long: Continue to stay positive.  Short: stay positive about his heart procedure. Long: maintain a positive outlook on mental health.     Interventions  Encouraged to attend Cardiac Rehabilitation for the exercise  Encouraged to attend Cardiac Rehabilitation for the exercise  Encouraged to attend Cardiac Rehabilitation for the exercise     Continue Psychosocial Services   Follow up required by staff  Follow up required by staff  Follow up required by staff        Psychosocial Discharge (Final Psychosocial Re-Evaluation): Psychosocial Re-Evaluation - 03/14/20 1340      Psychosocial Re-Evaluation   Current issues with  Current Stress Concerns    Comments   Patient has to go get a procedure done on his heart. He is able to see family and go fishing and do things that he enjoys. He is getting an ablation done on Monday.    Expected Outcomes  Short: stay positive about his heart procedure. Long: maintain a positive outlook on mental health.    Interventions  Encouraged to attend Cardiac Rehabilitation for the exercise    Continue Psychosocial Services   Follow up required by staff       Vocational Rehabilitation: Provide vocational rehab assistance to qualifying candidates.   Vocational Rehab Evaluation & Intervention: Vocational Rehab - 01/04/20 1037      Initial Vocational Rehab Evaluation & Intervention   Assessment shows need for Vocational Rehabilitation  No       Education: Education Goals: Education classes will be provided on a variety of topics geared toward better understanding of heart health and risk factor modification. Participant will state understanding/return demonstration of topics presented as noted by education test scores.  Learning Barriers/Preferences: Learning Barriers/Preferences - 01/04/20 1036      Learning Barriers/Preferences   Learning Barriers  None    Learning Preferences  None       General Cardiac Education Topics:  AED/CPR: - Group verbal and written instruction with the use of models to demonstrate the basic use of the AED with the basic ABC's of resuscitation.   Anatomy & Physiology of the Heart: - Group verbal and written instruction and models provide basic cardiac anatomy and physiology, with the coronary electrical and arterial systems. Review of Valvular disease and Heart Failure  Cardiac Procedures: - Group verbal and written instruction to review commonly prescribed medications for heart disease. Reviews the medication, class of the drug, and side effects. Includes the steps to properly store meds and maintain the prescription regimen. (beta blockers and nitrates)   Cardiac  Medications I: - Group verbal and written instruction to review commonly prescribed medications for heart disease. Reviews the medication, class of the drug, and side effects. Includes the steps to properly store meds and maintain the prescription regimen.   Cardiac Medications II: -Group verbal and written instruction to review commonly prescribed medications for heart disease. Reviews the medication, class of the drug, and side effects. (all other drug classes)    Go Sex-Intimacy & Heart Disease, Get SMART - Goal Setting: - Group verbal and written instruction through game format to discuss heart disease and the return to sexual intimacy. Provides group verbal and written material to discuss and apply goal setting through the application of the S.M.A.R.T. Method.   Other Matters of the Heart: - Provides group verbal, written materials and models to describe Stable Angina and Peripheral Artery. Includes description of the disease process and treatment options available to the cardiac patient.   Infection Prevention: - Provides verbal and written material to individual with discussion of infection control including proper hand washing and proper equipment cleaning during exercise session.   Cardiac Rehab from 01/08/2020 in [facility] Cardiac and Pulmonary Rehab  Date  01/08/20  Educator  [facility]  Instruction Review Code  1- Verbalizes Understanding      Falls Prevention: - Provides verbal and written material to individual with discussion of falls prevention and safety.   Cardiac Rehab from 01/08/2020 in [facility] Cardiac and Pulmonary Rehab  Date  01/08/20  Educator  [facility]  Instruction Review Code  1- Verbalizes Understanding      Other: -Provides group and verbal instruction on various topics (see comments)   Knowledge Questionnaire Score: Knowledge Questionnaire Score - 01/08/20 1259      Knowledge Questionnaire Score   Pre Score  20/26 Education Focus: Depression, Angina, Nutrition, Exercise        Core Components/Risk Factors/Patient Goals at Admission: Personal Goals and Risk Factors at Admission - 01/08/20 1259      Core Components/Risk Factors/Patient Goals on Admission    Weight Management  Yes;Obesity;Weight Loss    Intervention  Weight Management: Develop a combined nutrition and exercise program designed to reach desired caloric intake, while maintaining appropriate intake of nutrient and fiber, sodium and fats, and appropriate energy expenditure required for the weight goal.;Weight Management: Provide education and appropriate resources to help participant work on and attain dietary goals.;Weight Management/Obesity: Establish reasonable short term and long term weight goals.;Obesity: Provide education and appropriate resources to help participant work on and attain dietary goals.    Admit Weight  241 lb 11.2 oz (109.6 kg)    Goal Weight: Short Term  235 lb (106.6 kg)    Goal Weight: Long Term  230 lb (104.3 kg)    Expected Outcomes  Short Term: Continue to assess and modify interventions until short term weight is achieved;Long Term: Adherence to nutrition and physical activity/exercise program aimed toward attainment of established weight goal;Weight Loss: Understanding of general recommendations for a balanced deficit meal plan, which promotes 1-2 lb weight loss per week and includes a negative energy balance of [phone] kcal/d;Understanding recommendations for meals to include 15-35% energy as protein, 25-35% energy from fat, 35-60% energy from carbohydrates, less than 271m of dietary cholesterol, 20-35  gm of total fiber daily;Understanding of distribution of calorie intake throughout the day with the consumption of 4-5 meals/snacks    Diabetes  Yes    Intervention  Provide education about signs/symptoms and action to take for hypo/hyperglycemia.;Provide education about proper nutrition, including hydration, and aerobic/resistive exercise prescription along with prescribed  medications to achieve blood glucose in normal ranges: Fasting glucose 65-99 mg/dL    Expected Outcomes  Short Term: Participant verbalizes understanding of the signs/symptoms and immediate care of hyper/hypoglycemia, proper foot care and importance of medication, aerobic/resistive exercise and nutrition plan for blood glucose control.;Long Term: Attainment of HbA1C < 7%.    Lipids  Yes    Intervention  Provide education and support for participant on nutrition & aerobic/resistive exercise along with prescribed medications to achieve LDL <74m, HDL >426m    Expected Outcomes  Short Term: Participant states understanding of desired cholesterol values and is compliant with medications prescribed. Participant is following exercise prescription and nutrition guidelines.;Long Term: Cholesterol controlled with medications as prescribed, with individualized exercise RX and with personalized nutrition plan. Value goals: LDL < 7048mHDL > 40 mg.       Education:Diabetes - Individual verbal and written instruction to review signs/symptoms of diabetes, desired ranges of glucose level fasting, after meals and with exercise. Acknowledge that pre and post exercise glucose checks will be done for 3 sessions at entry of program.   [facility]  Date  01/04/20  [facility]  [facility]  1- VerUnited States Steel Corporationderstanding      Education: Know Your Numbers and Risk Factors: -Group verbal and written instruction about important numbers in your health.  Discussion of what are risk factors and how they play a role in the disease process.  Review of Cholesterol, Blood Pressure, Diabetes, and BMI and the role they play in your overall health.   Core Components/Risk Factors/Patient Goals Review:  Goals and Risk Factor Review    Row Name 01/18/20 1340 02/05/20 1310 03/14/20 1342         Core Components/Risk Factors/Patient Goals Review   Personal Goals  Review  Weight Management/Obesity;Hypertension  Weight Management/Obesity;Hypertension  Weight Management/Obesity;Hypertension;Diabetes     Review  Tim Mccullough is doing well with his weight and coming down some each day.  Blood pressures are doing well and he checks them at home.  Sugars have also been good.  Tim Mccullough is doing well in rehab. Weight is down some.  Blood pressures are good.  On new med reginme to manage new onset afib.  Tim Mccullough wants to lose more weight. He has lost around 20 pounds since the start of the program. His blood pressure has been stable and he has no questions about his medications.     Expected Outcomes  Short: Continue to work on weight loss  Long: Continue to monitor risk factors  Short: Continue to work on weight loss  Long: Continue to monitor risk factors  Short: continue with weight loss. Long: lose 10 more pounds.        Core Components/Risk Factors/Patient Goals at Discharge (Final Review):  Goals and Risk Factor Review - 03/14/20 1342      Core Components/Risk Factors/Patient Goals Review   Personal Goals Review  Weight Management/Obesity;Hypertension;Diabetes    Review  Tim Mccullough wants to lose more weight. He has lost around 20 pounds since the start of the program. His blood pressure has been stable and he has no questions about his medications.  Expected Outcomes  Short: continue with weight loss. Long: lose 10 more pounds.       ITP Comments: ITP Comments    Row Name 01/04/20 1048 01/08/20 1100 01/10/20 1323 01/31/20 1032 02/20/20 1314   ITP Comments  Initial orientation phone call completed. Diagnosis can be found in [facility] 1/29. EP orientation scheduled for 3/1 at 9:30  Completed 6MWT and gym orientation.  Initial ITP created and sent for review to Dr. Emily Filbert, Medical Director.  First full day of exercise!  Patient was oriented to gym and equipment including functions, settings, policies, and procedures.  Patient's individual exercise prescription and treatment  plan were reviewed.  All starting workloads were established based on the results of the 6 minute walk test done at initial orientation visit.  The plan for exercise progression was also introduced and progression will be customized based on patient's performance and goals.  30 day chart review completed. ITP sent to Dr Zachery Dakins Medical Director, for review,changes as needed and signature. New to program  Completed Initial RD Eval   Row Name 02/28/20 5916 03/07/20 1424 03/14/20 1317 03/27/20 0740     ITP Comments  30 Day review completed. Medical Director review done, changes made as directed,and approval shown by signature of Market researcher.  Called to check on pt.  He has missed a couple of visits for doctor's appointments.  He has cataracts on both eyes and is scheduled for an ablation for 5/10.  He has also been feeling under the weather some this week.  He hopes to return next week.  Tim Mccullough is having an ablation on Monday 5/10  30 Day review completed. ITP review done, changes made as directed,and approval shown by signature of Scientist, research (life sciences).       Comments:

## 2020-04-01 ENCOUNTER — Telehealth: Payer: Self-pay

## 2020-04-01 NOTE — Telephone Encounter (Signed)
Called pt as we have not heard from him since his procedure or gotten clearance for him to come back. Pt will call doctor tomorrow to see about clearance.

## 2020-04-02 ENCOUNTER — Telehealth: Payer: Self-pay | Admitting: Internal Medicine

## 2020-04-02 NOTE — Telephone Encounter (Signed)
I called and spoke with the patient. I advised him that I will review with Dr. Caryl Comes about returning to Cardiac Rehab s/p PVC ablation on 03/18/20.  Per Dr. 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  The PVCs were deep in the ventricular septum.  The earliest pace map match on the left ventricular endocardium was 85 to 90%.  RF applied to the left and right ventricular sides of the ventricular septum resulted in suppression of the patient's PVCs but failed to permanently suppress the PVCs.  Tim Peru, MD  The patient is aware it may be Thursday before I can get back with him, but will touch base with him this week. He voices understanding and is agreeable.

## 2020-04-02 NOTE — Telephone Encounter (Signed)
Tim Mccullough  he was started on dronaderone for the PVCs   Reasonable to return to rehab and we can see how he does  Thanks SK

## 2020-04-02 NOTE — Telephone Encounter (Signed)
Patient states cardiac rehab needs a note of clearance to resume s/p ablation

## 2020-04-03 ENCOUNTER — Other Ambulatory Visit: Payer: Self-pay

## 2020-04-03 ENCOUNTER — Encounter: Payer: Self-pay | Admitting: *Deleted

## 2020-04-03 ENCOUNTER — Encounter: Payer: Medicare Other | Admitting: *Deleted

## 2020-04-03 DIAGNOSIS — E119 Type 2 diabetes mellitus without complications: Secondary | ICD-10-CM | POA: Diagnosis not present

## 2020-04-03 DIAGNOSIS — I110 Hypertensive heart disease with heart failure: Secondary | ICD-10-CM | POA: Diagnosis not present

## 2020-04-03 DIAGNOSIS — I5022 Chronic systolic (congestive) heart failure: Secondary | ICD-10-CM

## 2020-04-03 DIAGNOSIS — Z87891 Personal history of nicotine dependence: Secondary | ICD-10-CM | POA: Diagnosis not present

## 2020-04-03 NOTE — Progress Notes (Signed)
Daily Session Note  Patient Details  Name: Tim Mccullough MRN: [id number] Date of Birth: 1951/01/15 Referring Provider:     Cardiac Rehab from 01/08/2020 in [facility] Cardiac and Pulmonary Rehab  Referring Provider  [name] MD      Encounter Date: 04/03/2020  Check In: Session Check In - 04/03/20 1305      Check-In   Supervising physician immediately available to respond to emergencies  See telemetry face sheet for immediately available ER MD    Location  [facility]-Cardiac & Pulmonary Rehab    Staff Present  Renita Papa, RN BSN;Melissa Caiola RDN, LDN;Jessica Trumbull, MA, RCEP, CCRP, CCET;Amanda Sommer, BA, ACSM CEP, Exercise Physiologist    Virtual Visit  No    Medication changes reported      Yes    Comments  started Dronedarone    Fall or balance concerns reported     No    Warm-up and Cool-down  Performed on first and last piece of equipment    Resistance Training Performed  Yes    VAD Patient?  No    PAD/SET Patient?  No      Pain Assessment   Currently in Pain?  No/denies          Social History   Tobacco Use  Smoking Status Former Smoker  . Years: 20.00  . Types: Cigarettes  . Quit date: 11/09/1998  . Years since quitting: 21.4  Smokeless Tobacco Former Systems developer  . Quit date: 06/03/2007  Tobacco Comment   no alcohol since nov 12    Goals Met:  Independence with exercise equipment Exercise tolerated well No report of cardiac concerns or symptoms Strength training completed today  Goals Unmet:  Not Applicable  Comments: Pt able to follow exercise prescription today without complaint.  Will continue to monitor for progression.    Dr. Emily Filbert is Medical Director for [facility] and [facility].

## 2020-04-03 NOTE — Telephone Encounter (Signed)
A letter has been done stating the patient may return to Cardiac Rehab per Dr. Caryl Comes. I have called the [facility] Cardiac Rehab Dept and made them aware as well (spoke with Janett Billow).  I advised Cardiac Rehab that a letter has been done and visible in Epic. Per Janett Billow, that is ok- no need to fax.  I have also notified the patient that he is ok to return to Cardiac Rehab.   The patient voices understanding.

## 2020-04-04 DIAGNOSIS — H25043 Posterior subcapsular polar age-related cataract, bilateral: Secondary | ICD-10-CM | POA: Diagnosis not present

## 2020-04-04 DIAGNOSIS — H2512 Age-related nuclear cataract, left eye: Secondary | ICD-10-CM | POA: Diagnosis not present

## 2020-04-04 DIAGNOSIS — H40013 Open angle with borderline findings, low risk, bilateral: Secondary | ICD-10-CM | POA: Diagnosis not present

## 2020-04-04 DIAGNOSIS — H18413 Arcus senilis, bilateral: Secondary | ICD-10-CM | POA: Diagnosis not present

## 2020-04-04 DIAGNOSIS — H2513 Age-related nuclear cataract, bilateral: Secondary | ICD-10-CM | POA: Diagnosis not present

## 2020-04-04 DIAGNOSIS — H25013 Cortical age-related cataract, bilateral: Secondary | ICD-10-CM | POA: Diagnosis not present

## 2020-04-09 ENCOUNTER — Encounter: Payer: Self-pay | Admitting: Family Medicine

## 2020-04-10 ENCOUNTER — Other Ambulatory Visit: Payer: Self-pay

## 2020-04-10 ENCOUNTER — Encounter: Payer: Medicare Other | Attending: Cardiovascular Disease | Admitting: *Deleted

## 2020-04-10 DIAGNOSIS — E119 Type 2 diabetes mellitus without complications: Secondary | ICD-10-CM | POA: Insufficient documentation

## 2020-04-10 DIAGNOSIS — Z87891 Personal history of nicotine dependence: Secondary | ICD-10-CM | POA: Insufficient documentation

## 2020-04-10 DIAGNOSIS — I110 Hypertensive heart disease with heart failure: Secondary | ICD-10-CM | POA: Diagnosis present

## 2020-04-10 DIAGNOSIS — I5022 Chronic systolic (congestive) heart failure: Secondary | ICD-10-CM | POA: Diagnosis present

## 2020-04-10 NOTE — Progress Notes (Signed)
Daily Session Note  Patient Details  Name: Tim Mccullough MRN: [id number] Date of Birth: 12/09/1950 Referring Provider:     Cardiac Rehab from 01/08/2020 in [facility] Cardiac and Pulmonary Rehab  Referring Provider  Arida, Muhammad MD      Encounter Date: 04/10/2020  Check In: Session Check In - 04/10/20 1259      Check-In   Supervising physician immediately available to respond to emergencies  See telemetry face sheet for immediately available ER MD    Location  [facility]-Cardiac & Pulmonary Rehab    Staff Present   , RN BSN;Jessica Hawkins, MA, RCEP, CCRP, CCET;Susanne Bice, RN, BSN, CCRP    Virtual Visit  No    Medication changes reported      No    Fall or balance concerns reported     No    Warm-up and Cool-down  Performed on first and last piece of equipment    Resistance Training Performed  Yes    VAD Patient?  No    PAD/SET Patient?  No      Pain Assessment   Currently in Pain?  No/denies          Social History   Tobacco Use  Smoking Status Former Smoker  . Years: 20.00  . Types: Cigarettes  . Quit date: 11/09/1998  . Years since quitting: 21.4  Smokeless Tobacco Former User  . Quit date: 06/03/2007  Tobacco Comment   no alcohol since nov 12    Goals Met:  Independence with exercise equipment Exercise tolerated well No report of cardiac concerns or symptoms Strength training completed today  Goals Unmet:  Not Applicable  Comments: Pt able to follow exercise prescription today without complaint.  Will continue to monitor for progression.    Dr. Mark Miller is Medical Director for [facility] and [facility]. 

## 2020-04-11 ENCOUNTER — Telehealth: Payer: Self-pay | Admitting: Cardiovascular Disease

## 2020-04-11 ENCOUNTER — Encounter: Payer: Self-pay | Admitting: Internal Medicine

## 2020-04-11 ENCOUNTER — Other Ambulatory Visit: Payer: Self-pay

## 2020-04-11 ENCOUNTER — Ambulatory Visit (INDEPENDENT_AMBULATORY_CARE_PROVIDER_SITE_OTHER): Payer: Medicare Other | Admitting: Internal Medicine

## 2020-04-11 VITALS — BP 104/60 | HR 81 | Ht 70.0 in | Wt 215.5 lb

## 2020-04-11 DIAGNOSIS — I951 Orthostatic hypotension: Secondary | ICD-10-CM | POA: Diagnosis not present

## 2020-04-11 DIAGNOSIS — Z79899 Other long term (current) drug therapy: Secondary | ICD-10-CM

## 2020-04-11 DIAGNOSIS — I428 Other cardiomyopathies: Secondary | ICD-10-CM | POA: Diagnosis not present

## 2020-04-11 DIAGNOSIS — I5042 Chronic combined systolic (congestive) and diastolic (congestive) heart failure: Secondary | ICD-10-CM

## 2020-04-11 DIAGNOSIS — R000 Tachycardia, unspecified: Secondary | ICD-10-CM

## 2020-04-11 DIAGNOSIS — I472 Ventricular tachycardia: Secondary | ICD-10-CM | POA: Diagnosis not present

## 2020-04-11 DIAGNOSIS — I493 Ventricular premature depolarization: Secondary | ICD-10-CM

## 2020-04-11 MED ORDER — AMIODARONE HCL 400 MG PO TABS: ORAL_TABLET | ORAL | 0 refills | Status: DC

## 2020-04-11 NOTE — Patient Instructions (Addendum)
Medication Instructions:  - Your physician has recommended you make the following change in your medication:   1) Start Amiodarone 400 mg - take 1 tablet by mouth twice daily x 2 weeks, then - take 1 tablet by mouth once daily x 2 weeks, then  2) Start Amiodarone 200 mg - take 1 tablet by mouth by mouth once daily  3) Stop Multaq  4) Make sure you are not taking flecainide  *If you need a refill on your cardiac medications before your next appointment, please call your pharmacy*   Lab Work: - Your physician recommends that you have lab work today: Liver/ TSH  If you have labs (blood work) drawn today and your tests are completely normal, you will receive your results only by: Marland Kitchen MyChart Message (if you have MyChart) OR . A paper copy in the mail If you have any lab test that is abnormal or we need to change your treatment, we will call you to review the results.   Testing/Procedures: 1) ZIO XT monitor  - Your physician has recommended that you wear a 7 day Zio (heart) monitor- in 8 weeks (this will be mailed to your home address) This monitor is a medical device that records the heart's electrical activity. Doctors most often use these monitors to diagnose arrhythmias. Arrhythmias are problems with the speed or rhythm of the heartbeat. The monitor is a small device applied to your chest. You can wear one while you do your normal daily activities. While wearing this monitor if you have any symptoms to push the button and record what you felt. Once you have worn this monitor for the period of time provider prescribed (Usually 14 days), you will return the monitor device in the postage paid box. Once it is returned they will download the data collected and provide [place] with a report which the provider will then review and we will call you with those results. Important tips:  1. Avoid showering during the first 24 hours of wearing the monitor. 2. Avoid excessive sweating to help maximize  wear time. 3. Do not submerge the device, no hot tubs, and no swimming pools. 4. Keep any lotions or oils away from the patch. 5. After 24 hours you may shower with the patch on. Take brief showers with your back facing the shower head.  6. Do not remove patch once it has been placed because that will interrupt data and decrease adhesive wear time. 7. Push the button when you have any symptoms and write down what you were feeling. 8. Once you have completed wearing your monitor, remove and place into box which has postage paid and place in your outgoing mailbox.  9. If for some reason you have misplaced your box then call our office and we can provide another box and/or mail it off for you.        2. Echocardiogram  - Your physician has requested that you have an echocardiogram- in 10 weeks. Echocardiography is a painless test that uses sound waves to create images of your heart. It provides your doctor with information about the size and shape of your heart and how well your heart's chambers and valves are working. This procedure takes approximately one hour. There are no restrictions for this procedure.   Follow-Up: At [facility], you and your health needs are our priority.  As part of our continuing mission to provide you with exceptional heart care, we have created designated Provider Care Teams.  These  Care Teams include your primary Cardiologist (physician) and Advanced Practice Providers (APPs -  Physician Assistants and Nurse Practitioners) who all work together to provide you with the care you need, when you need it.  We recommend signing up for the patient portal called "MyChart".  Sign up information is provided on this After Visit Summary.  MyChart is used to connect with patients for Virtual Visits (Telemedicine).  Patients are able to view lab/test results, encounter notes, upcoming appointments, etc.  Non-urgent messages can be sent to your provider as well.   To learn more  about what you can do with MyChart, go to [email or web address].    Your next appointment:   12 week(s)  The format for your next appointment:   In Person  Provider:   Virl Axe, MD   Other Instructions   Amiodarone tablets What is this medicine? AMIODARONE (a MEE oh da rone) is an antiarrhythmic drug. It helps make your heart beat regularly. Because of the side effects caused by this medicine, it is only used when other medicines have not worked. It is usually used for heartbeat problems that may be life threatening. This medicine may be used for other purposes; ask your health care provider or pharmacist if you have questions. COMMON BRAND NAME(S): Cordarone, Pacerone What should I tell my health care provider before I take this medicine? They need to know if you have any of these conditions:  liver disease  lung disease  other heart problems  thyroid disease  an unusual or allergic reaction to amiodarone, iodine, other medicines, foods, dyes, or preservatives  pregnant or trying to get pregnant  breast-feeding How should I use this medicine? Take this medicine by mouth with a glass of water. Follow the directions on the prescription label. You can take this medicine with or without food. However, you should always take it the same way each time. Take your doses at regular intervals. Do not take your medicine more often than directed. Do not stop taking except on the advice of your doctor or health care professional. A special MedGuide will be given to you by the pharmacist with each prescription and refill. Be sure to read this information carefully each time. Talk to your pediatrician regarding the use of this medicine in children. Special care may be needed. Overdosage: If you think you have taken too much of this medicine contact a poison control center or emergency room at once. NOTE: This medicine is only for you. Do not share this medicine with others. What if  I miss a dose? If you miss a dose, take it as soon as you can. If it is almost time for your next dose, take only that dose. Do not take double or extra doses. What may interact with this medicine? Do not take this medicine with any of the following medications:  abarelix  apomorphine  arsenic trioxide  certain antibiotics like erythromycin, gemifloxacin, levofloxacin, pentamidine  certain medicines for depression like amoxapine, tricyclic antidepressants  certain medicines for fungal infections like fluconazole, itraconazole, ketoconazole, posaconazole, voriconazole  certain medicines for irregular heart beat like disopyramide, dronedarone, ibutilide, propafenone, sotalol  certain medicines for malaria like chloroquine, halofantrine  cisapride  droperidol  haloperidol  hawthorn  maprotiline  methadone  phenothiazines like chlorpromazine, mesoridazine, thioridazine  pimozide  ranolazine  red yeast rice  vardenafil This medicine may also interact with the following medications:  antiviral medicines for HIV or AIDS  certain medicines for blood pressure, heart disease, irregular heart  beat  certain medicines for cholesterol like atorvastatin, cerivastatin, lovastatin, simvastatin  certain medicines for hepatitis C like sofosbuvir and ledipasvir; sofosbuvir  certain medicines for seizures like phenytoin  certain medicines for thyroid problems  certain medicines that treat or prevent blood clots like warfarin  cholestyramine  cimetidine  clopidogrel  cyclosporine  dextromethorphan  diuretics  dofetilide  fentanyl  general anesthetics  grapefruit juice  lidocaine  loratadine  methotrexate  other medicines that prolong the QT interval (cause an abnormal heart rhythm)  procainamide  quinidine  rifabutin, rifampin, or rifapentine  St. John's Wort  trazodone  ziprasidone This list may not describe all possible interactions. Give  your health care provider a list of all the medicines, herbs, non-prescription drugs, or dietary supplements you use. Also tell them if you smoke, drink alcohol, or use illegal drugs. Some items may interact with your medicine. What should I watch for while using this medicine? Your condition will be monitored closely when you first begin therapy. Often, this drug is first started in a hospital or other monitored health care setting. Once you are on maintenance therapy, visit your doctor or health care professional for regular checks on your progress. Because your condition and use of this medicine carry some risk, it is a good idea to carry an identification card, necklace or bracelet with details of your condition, medications, and doctor or health care professional. Dennis Bast may get drowsy or dizzy. Do not drive, use machinery, or do anything that needs mental alertness until you know how this medicine affects you. Do not stand or sit up quickly, especially if you are an older patient. This reduces the risk of dizzy or fainting spells. This medicine can make you more sensitive to the sun. Keep out of the sun. If you cannot avoid being in the sun, wear protective clothing and use sunscreen. Do not use sun lamps or tanning beds/booths. You should have regular eye exams before and during treatment. Call your doctor if you have blurred vision, see halos, or your eyes become sensitive to light. Your eyes may get dry. It may be helpful to use a lubricating eye solution or artificial tears solution. If you are going to have surgery or a procedure that requires contrast dyes, tell your doctor or health care professional that you are taking this medicine. What side effects may I notice from receiving this medicine? Side effects that you should report to your doctor or health care professional as soon as possible:  allergic reactions like skin rash, itching or hives, swelling of the face, lips, or tongue  blue-gray  coloring of the skin  blurred vision, seeing blue green halos, increased sensitivity of the eyes to light  breathing problems  chest pain  dark urine  fast, irregular heartbeat  feeling faint or light-headed  intolerance to heat or cold  nausea or vomiting  pain and swelling of the scrotum  pain, tingling, numbness in feet, hands  redness, blistering, peeling or loosening of the skin, including inside the mouth  spitting up blood  stomach pain  sweating  unusual or uncontrolled movements of body  unusually weak or tired  weight gain or loss  yellowing of the eyes or skin Side effects that usually do not require medical attention (report to your doctor or health care professional if they continue or are bothersome):  change in sex drive or performance  constipation  dizziness  headache  loss of appetite  trouble sleeping This list may not describe  all possible side effects. Call your doctor for medical advice about side effects. You may report side effects to FDA at 1-800-FDA-1088. Where should I keep my medicine? Keep out of the reach of children. Store at room temperature between 20 and 25 degrees C (68 and 77 degrees F). Protect from light. Keep container tightly closed. Throw away any unused medicine after the expiration date. NOTE: This sheet is a summary. It may not cover all possible information. If you have questions about this medicine, talk to your doctor, pharmacist, or health care provider.  2020 Elsevier/Gold Standard (2018-09-28 13:44:04)   Echocardiogram An echocardiogram is a procedure that uses painless sound waves (ultrasound) to produce an image of the heart. Images from an echocardiogram can provide important information about:  Signs of coronary artery disease (CAD).  Aneurysm detection. An aneurysm is a weak or damaged part of an artery wall that bulges out from the normal force of blood pumping through the body.  Heart size and  shape. Changes in the size or shape of the heart can be associated with certain conditions, including heart failure, aneurysm, and CAD.  Heart muscle function.  Heart valve function.  Signs of a past heart attack.  Fluid buildup around the heart.  Thickening of the heart muscle.  A tumor or infectious growth around the heart valves. Tell a health care provider about:  Any allergies you have.  All medicines you are taking, including vitamins, herbs, eye drops, creams, and over-the-counter medicines.  Any blood disorders you have.  Any surgeries you have had.  Any medical conditions you have.  Whether you are pregnant or may be pregnant. What are the risks? Generally, this is a safe procedure. However, problems may occur, including:  Allergic reaction to dye (contrast) that may be used during the procedure. What happens before the procedure? No specific preparation is needed. You may eat and drink normally. What happens during the procedure?   An IV tube may be inserted into one of your veins.  You may receive contrast through this tube. A contrast is an injection that improves the quality of the pictures from your heart.  A gel will be applied to your chest.  A wand-like tool (transducer) will be moved over your chest. The gel will help to transmit the sound waves from the transducer.  The sound waves will harmlessly bounce off of your heart to allow the heart images to be captured in real-time motion. The images will be recorded on a computer. The procedure may vary among health care providers and hospitals. What happens after the procedure?  You may return to your normal, everyday life, including diet, activities, and medicines, unless your health care provider tells you not to do that. Summary  An echocardiogram is a procedure that uses painless sound waves (ultrasound) to produce an image of the heart.  Images from an echocardiogram can provide important  information about the size and shape of your heart, heart muscle function, heart valve function, and fluid buildup around your heart.  You do not need to do anything to prepare before this procedure. You may eat and drink normally.  After the echocardiogram is completed, you may return to your normal, everyday life, unless your health care provider tells you not to do that. This information is not intended to replace advice given to you by your health care provider. Make sure you discuss any questions you have with your health care provider. Document Revised: 02/16/2019 Document Reviewed: 11/28/2016 Elsevier  Patient Education  [employer].

## 2020-04-11 NOTE — Telephone Encounter (Signed)
He can follow-up with me in September.

## 2020-04-11 NOTE — Progress Notes (Signed)
Patient Care Team: Tonia Ghent, MD as PCP - General (Family Medicine) Wellington Hampshire, MD as PCP - Cardiology (Cardiology)   HPI  Tim Mccullough is a 69 y.o. male seen in  for wide complex beats in a pattern of bigeminy in the setting of nonischemic cardiomyopathy initially diagnosed in 2010.  These records are not available but the old chart reports ejection fraction of 35% and no obstructive coronary disease.  It also mentions a hemodynamically insignificant VSD.  He was started on flecainide. Without improvement    He has been treated with beta-blockers and Entresto     History of orthostatic lightheadedness w abrupt standing Diabetes.  Not supposed to have significant neuropathy.   DATE TEST EF    5/13 Echo   50-55 %    1/21 Echo   30-35 %    2/21 LHC   [place] - normal    Date Cr K Mg Hgb  2/21 1.15 5.1  13.8  3/21  1.32 4.7 1.9<<1.3      5/21 underwent EP testing and attempted ablation of an intramural/septal focus.  PVCs could be suppressed but reemerged following cessation of energy. He was started on dronaderone which was both very expensive and has been largely ineffective in suppressing PVCs or in mitigating some of his weakness spells.  No significant shortness of breath or chest pain. Records and Results Reviewed*   Past Medical History:  Diagnosis Date  . Abnormal echocardiogram 2/11   Repeated after med mgmt of cardiomyopathy showed EF 40-45%, mildly dilated LV, mild LV hypertrophy, anterolateral and apical hypokinesis, mild diastolic dysfunction.  . Anemia    hx  . Arthritis   . Blood transfusion without reported diagnosis 09/2012  . BPH (benign prostatic hypertrophy) 2004  . Cancer ([place])    melanoma back  . Cardiomyopathy    ? tachycardia-mediated  . Depression   . Diabetes mellitus 11/2006   Type II  . Diverticulosis   . Echocardiogram abnormal 7/10   Mild LVH with a mildly dilated LV.  EF was difficult to assess given poor acoustic  windows but appeared moderately decreased.  Mild MR.  Probable small perimembranous VSD, mild LAE  . Esophageal erosions   . GERD (gastroesophageal reflux disease)   . Hip pain   . History of MRI 7/10   Cardiac MRI was done to followup difficult echo.  This showed mild to moderately dilated LV, mild to moderate LAE, EF  33% with global hypokinesis, normal RV size with mild systolic dysfunction.  There was no large VSD evident.  There was no myocardial delayed enhancement so no evidence for infiltrative disease or infarction.  LHC (7/10) showed only luminal irreg. in the coronaries and EF 35%  . Holter monitor, abnormal 7/10   Frequent PAC's and PVC's  Average HR 96  (but he had started Toprol XL).  HR range 71-130  . Hyperlipidemia 04/1998  . Hypertension 1995  . Obesity   . OSA (obstructive sleep apnea)    Not using CPAP- improved with weight loss  . Pneumonia    HX OF pna  . Sinus tachycardia    Pt says he has been told his heart rate is high since he was in high school.   . Sleep apnea    has cpap; but does not use  . VSD (ventricular septal defect)    Probable small perimembranous    Past Surgical History:  Procedure Laterality Date  .  CARDIAC CATHETERIZATION  05/30/2009   Dilated cardiomyopathy.  min VSD.  Hypokin Inf  wall.  Multiple min luminal Irr.  EF 35%  . COLONOSCOPY W/ BIOPSIES  08/24/2006   Divertics, polyps x3, bx negative, repeat 2012 with mild diverticulosis in teh sigmoid colon and 1 polyp in tranverse colon, repeat due 2017  . ESOPHAGOGASTRODUODENOSCOPY  2012   1)Barrett's esoph, 2) Erosive esophagitis, 3) Mild gastritis in the antrum, 4) Duodenitis in the bulb of duodenum, 5) Small hiatal hernia  . INGUINAL HERNIA REPAIR  1985   Left  . INGUINAL HERNIA REPAIR  1999   Right  . KNEE ARTHROSCOPY  10/15/2011   Procedure: ARTHROSCOPY KNEE;  Surgeon: Ninetta Lights, MD;  Location: [place];  Service: Orthopedics;  Laterality: Right;  right knee scope  with lateral meniscectomy, removal loose foreign body, and microfracture technique  . LAMINECTOMY  1987   with discectomy  . PVC ABLATION N/A 03/18/2020   Procedure: PVC ABLATION;  Surgeon: Evans Lance, MD;  Location: [place] [facility];  Service: Cardiovascular;  Laterality: N/A;  . RIGHT/LEFT HEART CATH AND CORONARY ANGIOGRAPHY N/A 12/25/2019   Procedure: RIGHT/LEFT HEART CATH AND CORONARY ANGIOGRAPHY;  Surgeon: Wellington Hampshire, MD;  Location: [place] [facility];  Service: Cardiovascular;  Laterality: N/A;  . ROTATOR CUFF REPAIR  2/11   rt  . TOTAL HIP ARTHROPLASTY  2011   Left  . TOTAL KNEE ARTHROPLASTY  06/01/2012   Procedure: TOTAL KNEE ARTHROPLASTY;  Surgeon: Ninetta Lights, MD;  Location: [place];  Service: Orthopedics;  Laterality: Right;    Current Meds  Medication Sig  . aspirin 81 MG tablet Take 81 mg by mouth daily.   Marland Kitchen atorvastatin (LIPITOR) 40 MG tablet TAKE 1 TABLET BY MOUTH EVERY DAY (Patient taking differently: Take 40 mg by mouth daily. TAKE 1 TABLET BY MOUTH EVERY DAY)  . doxazosin (CARDURA) 1 MG tablet 1 tablet daily (Patient taking differently: Take 1 mg by mouth daily. 1 tablet daily)  . dronedarone (MULTAQ) 400 MG tablet Take 1 tablet (400 mg total) by mouth 2 (two) times daily with a meal.  . fish oil-omega-3 fatty acids 1000 MG capsule Take 1 g by mouth 2 (two) times daily.   . flecainide (TAMBOCOR) 100 MG tablet Take 100 mg by mouth in the morning and at bedtime.  Marland Kitchen gatifloxacin (ZYMAXID) 0.5 % SOLN Place 1 drop into the left eye 4 (four) times daily.  Marland Kitchen losartan (COZAAR) 25 MG tablet Take 1 tablet (25 mg) by mouth once daily at bedtime  . magnesium oxide (MAG-OX) 400 (241.3 Mg) MG tablet Take 1 tablet (400 mg total) by mouth daily.  . metFORMIN (GLUCOPHAGE) 500 MG tablet TAKE 2 TABLETS (1,000 MG TOTAL) BY MOUTH 2 (TWO) TIMES DAILY WITH A MEAL.  . metoprolol tartrate (LOPRESSOR) 25 MG tablet Take 37.5 mg by mouth 2 (two) times daily.  Marland Kitchen omeprazole (PRILOSEC)  20 MG capsule Take 1 capsule (20 mg total) by mouth daily.  . ONE TOUCH ULTRA TEST test strip CHECK BLOOD SUGAR DAILY AND IF BLOOD SUGAR FLUCTUATES. 250.00  . OPCON-A 0.027-0.315 % SOLN Apply 1 drop to eye every morning.  Marland Kitchen PARoxetine (PAXIL) 20 MG tablet Take 1 tablet (20 mg total) by mouth daily.  Marland Kitchen spironolactone (ALDACTONE) 25 MG tablet Take 0.5 tablet (12.5 mg) by mouth once daily at bedtime (Patient taking differently: Take 25 mg by mouth daily. )  . triamcinolone ointment (KENALOG) 0.1 % Apply 1  application topically 2 (two) times daily as needed (irritation).     No Known Allergies    Review of Systems negative except from HPI and PMH  Physical Exam BP 104/60 (BP Location: Left Arm, Patient Position: Sitting, Cuff Size: Normal)   Pulse 81   Ht 5\' 10"  (1.778 m)   Wt 215 lb 8 oz (97.8 kg)   SpO2 96%   BMI 30.92 kg/m  Well developed and nourished in no acute distress HENT normal Neck supple with JVP-  flat  Clear Irregular rate and rhythm, no murmurs or gallops Abd-soft with active BS No Clubbing cyanosis edema Skin-warm and dry A & Oriented  Grossly normal sensory and motor function  ECG sinus at 80 Interval 17/14/43 Right bundle branch block Premature beats with a QRS duration of about 120 ms and delayed intrinsicoid deflection  Assessment and  Plan Nonischemic cardiomyopathy   Bigeminal wide-complex beat,  relatively narrow with a prolonged intrinsicoid deflection and a fused appearance suggestive of a junctional trigger capturing the intrinsic conduction system. \ Sleep disordered breathing/sleep apnea   Obesity interval weight loss   Diabetes   Hypertension  Orthostatic hypotension     Euvolemic continue current meds  The issue of his PVCs is challenging given the nature and the septum.  They were unavailable to unipolar ablation.  Dronaderone is been ineffective and unaffordable.  We will stop it.  We then discussed the role of amiodarone as a PVC/PJC  suppression trial.  The hope would be that we could suppress the ectopy and that this would translate into improved LV function.  In the event that this were the case, we would consider alternative ablation strategies including bipolar ablation and will consider referring to Dr Noralee Stain or others for their assistance.  In the event that there was suppression of ectopy and no change in LV function then given the narrowness of the beats we would just let them be.  If we are unable to suppress the ectopy the question of the contribution to the cardiomyopathy remains and would consider again referral for complex ablation.  More than 50% of 45 min was spent in counseling related to the above       Current medicines are reviewed at length with the patient today .  The patient does not  have concerns regarding medicines.

## 2020-04-11 NOTE — Telephone Encounter (Signed)
Per Caryl Comes visit today echo pushed out to august.   7/1 visit with Fletcher Anon is 3 m fu   Needed or should it be pushed out after echo also   Please advuse

## 2020-04-11 NOTE — Telephone Encounter (Signed)
Message fwd to Dr. Arida to advise. 

## 2020-04-12 LAB — HEPATIC FUNCTION PANEL
ALT: 30 IU/L (ref 0–44)
AST: 23 IU/L (ref 0–40)
Albumin: 4.3 g/dL (ref 3.8–4.8)
Alkaline Phosphatase: 75 IU/L (ref 48–121)
Bilirubin Total: 0.5 mg/dL (ref 0.0–1.2)
Bilirubin, Direct: 0.17 mg/dL (ref 0.00–0.40)
Total Protein: 6.6 g/dL (ref 6.0–8.5)

## 2020-04-12 LAB — TSH: TSH: 0.882 u[IU]/mL (ref 0.450–4.500)

## 2020-04-15 ENCOUNTER — Telehealth: Payer: Self-pay | Admitting: *Deleted

## 2020-04-15 ENCOUNTER — Encounter: Payer: Self-pay | Admitting: *Deleted

## 2020-04-15 DIAGNOSIS — I5022 Chronic systolic (congestive) heart failure: Secondary | ICD-10-CM

## 2020-04-15 NOTE — Telephone Encounter (Signed)
Attempted to schedule no ans no vm  

## 2020-04-15 NOTE — Telephone Encounter (Signed)
Called to check on Tim Mccullough.  He has been using eye drops every two hours for his surgery for cataracts on Wed 6/9.  He will get clearance to return.  He is also currently wearing a Zio patch for his afib.

## 2020-04-17 DIAGNOSIS — H2512 Age-related nuclear cataract, left eye: Secondary | ICD-10-CM | POA: Diagnosis not present

## 2020-04-18 ENCOUNTER — Other Ambulatory Visit: Payer: Medicare Other

## 2020-04-18 DIAGNOSIS — H2511 Age-related nuclear cataract, right eye: Secondary | ICD-10-CM | POA: Diagnosis not present

## 2020-04-24 ENCOUNTER — Encounter: Payer: Self-pay | Admitting: *Deleted

## 2020-04-24 ENCOUNTER — Ambulatory Visit: Payer: Medicare Other | Admitting: Internal Medicine

## 2020-04-24 DIAGNOSIS — I5022 Chronic systolic (congestive) heart failure: Secondary | ICD-10-CM

## 2020-04-24 NOTE — Progress Notes (Signed)
Cardiac Individual Treatment Plan  Patient Details  Name: EASTON FETTY MRN: [id number] Date of Birth: [birth year] Referring Provider:     Cardiac Rehab from 01/08/2020 in [facility] Cardiac and Pulmonary Rehab  Referring Provider [name] MD      Initial Encounter Date:    Cardiac Rehab from 01/08/2020 in [facility] Cardiac and Pulmonary Rehab  Date 01/08/20      Visit Diagnosis: Heart failure, chronic systolic (HCC)  Patient's Home Medications on Admission:  Current Outpatient Medications:  .  amiodarone (PACERONE) 400 MG tablet, Take 1 tablet (400 mg) by mouth twice daily x 2 weeks, then take 1 tablet (400 mg) by mouth once daily x 2 weeks, Disp: 42 tablet, Rfl: 0 .  aspirin 81 MG tablet, Take 81 mg by mouth daily. , Disp: , Rfl:  .  atorvastatin (LIPITOR) 40 MG tablet, TAKE 1 TABLET BY MOUTH EVERY DAY (Patient taking differently: Take 40 mg by mouth daily. TAKE 1 TABLET BY MOUTH EVERY DAY), Disp: 90 tablet, Rfl: 3 .  doxazosin (CARDURA) 1 MG tablet, 1 tablet daily (Patient taking differently: Take 1 mg by mouth daily. 1 tablet daily), Disp: 90 tablet, Rfl: 3 .  fish oil-omega-3 fatty acids 1000 MG capsule, Take 1 g by mouth 2 (two) times daily. , Disp: , Rfl:  .  gatifloxacin (ZYMAXID) 0.5 % SOLN, Place 1 drop into the left eye 4 (four) times daily., Disp: , Rfl:  .  losartan (COZAAR) 25 MG tablet, Take 1 tablet (25 mg) by mouth once daily at bedtime, Disp: , Rfl:  .  magnesium oxide (MAG-OX) 400 (241.3 Mg) MG tablet, Take 1 tablet (400 mg total) by mouth daily., Disp: 30 tablet, Rfl: 0 .  metFORMIN (GLUCOPHAGE) 500 MG tablet, TAKE 2 TABLETS (1,000 MG TOTAL) BY MOUTH 2 (TWO) TIMES DAILY WITH A MEAL., Disp: 360 tablet, Rfl: 3 .  metoprolol tartrate (LOPRESSOR) 25 MG tablet, Take 37.5 mg by mouth 2 (two) times daily., Disp: , Rfl:  .  omeprazole (PRILOSEC) 20 MG capsule, Take 1 capsule (20 mg total) by mouth daily., Disp: 30 capsule, Rfl: 11 .  ONE TOUCH ULTRA TEST test strip, CHECK BLOOD  SUGAR DAILY AND IF BLOOD SUGAR FLUCTUATES. 250.00, Disp: 100 each, Rfl: 11 .  OPCON-A 0.027-0.315 % SOLN, Apply 1 drop to eye every morning., Disp: , Rfl:  .  PARoxetine (PAXIL) 20 MG tablet, Take 1 tablet (20 mg total) by mouth daily., Disp: 90 tablet, Rfl: 3 .  spironolactone (ALDACTONE) 25 MG tablet, Take 0.5 tablet (12.5 mg) by mouth once daily at bedtime (Patient taking differently: Take 25 mg by mouth daily. ), Disp: , Rfl:  .  triamcinolone ointment (KENALOG) 0.1 %, Apply 1 application topically 2 (two) times daily as needed (irritation). , Disp: , Rfl:   Past Medical History: Past Medical History:  Diagnosis Date  . Abnormal echocardiogram 2/11   Repeated after med mgmt of cardiomyopathy showed EF 40-45%, mildly dilated LV, mild LV hypertrophy, anterolateral and apical hypokinesis, mild diastolic dysfunction.  . Anemia    hx  . Arthritis   . Blood transfusion without reported diagnosis 09/2012  . BPH (benign prostatic hypertrophy) 2004  . Cancer ([place])    melanoma back  . Cardiomyopathy    ? tachycardia-mediated  . Depression   . Diabetes mellitus 11/2006   Type II  . Diverticulosis   . Echocardiogram abnormal 7/10   Mild LVH with a mildly dilated LV.  EF was difficult to assess given  poor acoustic windows but appeared moderately decreased.  Mild MR.  Probable small perimembranous VSD, mild LAE  . Esophageal erosions   . GERD (gastroesophageal reflux disease)   . Hip pain   . History of MRI 7/10   Cardiac MRI was done to followup difficult echo.  This showed mild to moderately dilated LV, mild to moderate LAE, EF  33% with global hypokinesis, normal RV size with mild systolic dysfunction.  There was no large VSD evident.  There was no myocardial delayed enhancement so no evidence for infiltrative disease or infarction.  LHC (7/10) showed only luminal irreg. in the coronaries and EF 35%  . Holter monitor, abnormal 7/10   Frequent PAC's and PVC's  Average HR 96  (but he had  started Toprol XL).  HR range 71-130  . Hyperlipidemia 04/1998  . Hypertension 1995  . Obesity   . OSA (obstructive sleep apnea)    Not using CPAP- improved with weight loss  . Pneumonia    HX OF pna  . Sinus tachycardia    Pt says he has been told his heart rate is high since he was in high school.   . Sleep apnea    has cpap; but does not use  . VSD (ventricular septal defect)    Probable small perimembranous    Tobacco Use: Social History   Tobacco Use  Smoking Status Former Smoker  . Years: 20.00  . Types: Cigarettes  . Quit date: 11/09/1998  . Years since quitting: 21.4  Smokeless Tobacco Former Systems developer  . Quit date: 06/03/2007  Tobacco Comment   no alcohol since nov 12    Labs: Recent Review Flowsheet Data    Labs for ITP Cardiac and Pulmonary Rehab Latest Ref Rng & Units 01/20/2019 04/26/2019 08/21/2019 11/28/2019 01/29/2020   Cholestrol 0 - 200 mg/dL - 246(H) 142 - -   LDLCALC 0 - 99 mg/dL - 169(H) 76 - -   LDLDIRECT mg/dL - - - - -   HDL >39.00 mg/dL - 43.40 38.70(L) - -   Trlycerides 0 - 149 mg/dL - 172.0(H) 137.0 - -   Hemoglobin A1c 4.8 - 5.6 % 6.9(A) 7.0(H) 7.0(H) 6.8(H) 6.7(H)   PHART 7.35 - 7.45 - - - - -   PCO2ART 35 - 45 mmHg - - - - -   HCO3 20.0 - 24.0 mEq/L - - - - -   TCO2 0 - 100 mmol/L - - - - -   O2SAT % - - - - -       Exercise Target Goals: Exercise Program Goal: Individual exercise prescription set using results from initial 6 min walk test and THRR while considering  patient's activity barriers and safety.   Exercise Prescription Goal: Initial exercise prescription builds to 30-45 minutes a day of aerobic activity, 2-3 days per week.  Home exercise guidelines will be given to patient during program as part of exercise prescription that the participant will acknowledge.   Education: Aerobic Exercise & Resistance Training: - Gives group verbal and written instruction on the various components of exercise. Focuses on aerobic and resistive  training programs and the benefits of this training and how to safely progress through these programs..   Education: Exercise & Equipment Safety: - Individual verbal instruction and demonstration of equipment use and safety with use of the equipment.   Cardiac Rehab from 01/08/2020 in [facility] Cardiac and Pulmonary Rehab  Date 01/08/20  Educator [facility]  Instruction Review Code 1- Verbalizes Understanding  Education: Exercise Physiology & General Exercise Guidelines: - Group verbal and written instruction with models to review the exercise physiology of the cardiovascular system and associated critical values. Provides general exercise guidelines with specific guidelines to those with heart or lung disease.    Education: Flexibility, Balance, Mind/Body Relaxation: Provides group verbal/written instruction on the benefits of flexibility and balance training, including mind/body exercise modes such as yoga, pilates and tai chi.  Demonstration and skill practice provided.   Activity Barriers & Risk Stratification:  Activity Barriers & Cardiac Risk Stratification - 01/08/20 1102      Activity Barriers & Cardiac Risk Stratification   Activity Barriers Joint Problems;Back Problems;Left Hip Replacement;Right Knee Replacement;Deconditioning;Muscular Weakness    Cardiac Risk Stratification High           6 Minute Walk:  6 Minute Walk    Row Name 01/08/20 1101         6 Minute Walk   Phase Initial     Distance 1163 feet     Walk Time 6 minutes     # of Rest Breaks 0     MPH 2.2     METS 2.71     RPE 11     VO2 Peak 9.49     Symptoms Yes (comment)     Comments Left hip starting to be bothersome at end 6/10     Resting HR 91 bpm     Resting BP 126/70     Resting Oxygen Saturation  96 %     Exercise Oxygen Saturation  during 6 min walk 93 %     Max Ex. HR 123 bpm     Max Ex. BP 142/70     2 Minute Post BP 130/60            Oxygen Initial Assessment:   Oxygen  Re-Evaluation:   Oxygen Discharge (Final Oxygen Re-Evaluation):   Initial Exercise Prescription:  Initial Exercise Prescription - 01/08/20 1100      Date of Initial Exercise RX and Referring Provider   Date 01/08/20    Referring Provider [name] MD      Treadmill   MPH 2.2    Grade 0    Minutes 15    METs 2.68      Recumbant Elliptical   Level 1    RPM 50    Minutes 15    METs 2.7      Elliptical   Level 1    Speed 2.5    Minutes 15      REL-XR   Level 2    Speed 50    Minutes 15    METs 2.7      Prescription Details   Frequency (times per week) 3    Duration Progress to 30 minutes of continuous aerobic without signs/symptoms of physical distress      Intensity   THRR 40-80% of Max Heartrate 115-139    Ratings of Perceived Exertion 11-13    Perceived Dyspnea 0-4      Progression   Progression Continue to progress workloads to maintain intensity without signs/symptoms of physical distress.      Resistance Training   Training Prescription Yes    Weight 4 lb           Perform Capillary Blood Glucose checks as needed.  Exercise Prescription Changes:  Exercise Prescription Changes    Row Name 01/08/20 1100 01/24/20 1300 02/07/20 1500 02/22/20 1200 03/05/20 1400  Response to Exercise   Blood Pressure (Admit) 126/70 118/68 134/70 104/56 102/62   Blood Pressure (Exercise) 142/74 128/74 134/64 128/60 124/68   Blood Pressure (Exit) 130/60 124/70 124/64 118/60 100/62   Heart Rate (Admit) 97 bpm 94 bpm 101 bpm 90 bpm 99 bpm   Heart Rate (Exercise) 123 bpm 105 bpm 121 bpm 133 bpm 134 bpm   Heart Rate (Exit) 94 bpm 97 bpm 102 bpm 101 bpm 104 bpm   Oxygen Saturation (Admit) 96 % -- -- -- --   Oxygen Saturation (Exercise) 93 % -- -- -- --   Rating of Perceived Exertion (Exercise) '11 13 13 13 14   ' Perceived Dyspnea (Exercise) 0 -- -- -- --   Symptoms hip bothersome at end 6/10 none none none none   Comments walk test results -- -- -- --   Duration  -- Continue with 30 min of aerobic exercise without signs/symptoms of physical distress. Continue with 30 min of aerobic exercise without signs/symptoms of physical distress. Continue with 30 min of aerobic exercise without signs/symptoms of physical distress. Continue with 30 min of aerobic exercise without signs/symptoms of physical distress.   Intensity -- THRR unchanged THRR unchanged THRR unchanged THRR unchanged     Progression   Progression -- Continue to progress workloads to maintain intensity without signs/symptoms of physical distress. Continue to progress workloads to maintain intensity without signs/symptoms of physical distress. Continue to progress workloads to maintain intensity without signs/symptoms of physical distress. Continue to progress workloads to maintain intensity without signs/symptoms of physical distress.   Average METs -- 2.77 3.06 3.3 3.45     Resistance Training   Training Prescription -- Yes Yes Yes Yes   Weight -- 4 lb 4 lb 4 lb 4 lb   Reps -- 10-15 10-15 10-15 10-15     Interval Training   Interval Training -- No No No No     Treadmill   MPH -- 2.2 2.2 2.2 2.2   Grade -- 0 0 0 2   Minutes -- '15 15 15 15   ' METs -- 2.68 2.68 2.68 3.3     Recumbant Bike   Level -- 4 2.5 4 --   Minutes -- '15 15 15 ' --   METs -- 3.7 3.6 4.4 --     NuStep   Level -- '6 6 6 ' --   Minutes -- '15 15 15 ' --   METs -- 2.5 2.9 3.3 --     Recumbant Elliptical   Level -- 2 -- -- --   Minutes -- 15 -- -- --   METs -- 2.2 -- -- --     REL-XR   Level -- -- '1 6 4   ' Speed -- -- 3 -- --   Minutes -- -- '15 15 15   ' METs -- -- -- 2.8 3.6     Home Exercise Plan   Plans to continue exercise at -- Home (comment)  walking Home (comment)  walking Home (comment)  walking Home (comment)  walking   Frequency -- Add 2 additional days to program exercise sessions. Add 2 additional days to program exercise sessions. Add 2 additional days to program exercise sessions. Add 2 additional days to  program exercise sessions.   Initial Home Exercises Provided -- 01/12/20 01/12/20 01/12/20 01/12/20   Row Name 03/18/20 1000 04/15/20 1500           Response to Exercise   Blood Pressure (Admit) 102/60 110/56  Blood Pressure (Exercise) 132/58 128/70      Blood Pressure (Exit) 122/64 112/58      Heart Rate (Admit) 95 bpm 78 bpm      Heart Rate (Exercise) 134 bpm 123 bpm      Heart Rate (Exit) 121 bpm 98 bpm      Rating of Perceived Exertion (Exercise) 13 13      Symptoms none none      Duration Continue with 30 min of aerobic exercise without signs/symptoms of physical distress. Continue with 30 min of aerobic exercise without signs/symptoms of physical distress.      Intensity THRR unchanged THRR unchanged        Progression   Progression Continue to progress workloads to maintain intensity without signs/symptoms of physical distress. Continue to progress workloads to maintain intensity without signs/symptoms of physical distress.      Average METs 3.61 3.55        Resistance Training   Training Prescription Yes Yes      Weight 4 lb 4 lb      Reps 10-15 10-15        Interval Training   Interval Training No No        Treadmill   MPH 2.5 --      Grade 3.5 --      Minutes 15 --      METs 4.12 --        Recumbant Bike   Level 4 3      Minutes 15 15      METs 4.3 3.6        NuStep   Level 6 5      Minutes 15 15      METs 3 3.5        REL-XR   Level 6 --      Minutes 15 --      METs 3 --        Home Exercise Plan   Plans to continue exercise at Home (comment)  walking Home (comment)  walking      Frequency Add 2 additional days to program exercise sessions. Add 2 additional days to program exercise sessions.      Initial Home Exercises Provided 01/12/20 01/12/20             Exercise Comments:  Exercise Comments    Row Name 01/10/20 1323           Exercise Comments First full day of exercise!  Patient was oriented to gym and equipment including  functions, settings, policies, and procedures.  Patient's individual exercise prescription and treatment plan were reviewed.  All starting workloads were established based on the results of the 6 minute walk test done at initial orientation visit.  The plan for exercise progression was also introduced and progression will be customized based on patient's performance and goals.              Exercise Goals and Review:  Exercise Goals    Row Name 01/08/20 1255             Exercise Goals   Increase Physical Activity Yes       Intervention Provide advice, education, support and counseling about physical activity/exercise needs.;Develop an individualized exercise prescription for aerobic and resistive training based on initial evaluation findings, risk stratification, comorbidities and participant's personal goals.       Expected Outcomes Short Term: Attend rehab on a regular basis to increase amount of physical activity.;Long  Term: Add in home exercise to make exercise part of routine and to increase amount of physical activity.;Long Term: Exercising regularly at least 3-5 days a week.       Increase Strength and Stamina Yes       Intervention Provide advice, education, support and counseling about physical activity/exercise needs.;Develop an individualized exercise prescription for aerobic and resistive training based on initial evaluation findings, risk stratification, comorbidities and participant's personal goals.       Expected Outcomes Short Term: Increase workloads from initial exercise prescription for resistance, speed, and METs.;Short Term: Perform resistance training exercises routinely during rehab and add in resistance training at home;Long Term: Improve cardiorespiratory fitness, muscular endurance and strength as measured by increased METs and functional capacity (6MWT)       Able to understand and use rate of perceived exertion (RPE) scale Yes       Intervention Provide education and  explanation on how to use RPE scale       Expected Outcomes Short Term: Able to use RPE daily in rehab to express subjective intensity level;Long Term:  Able to use RPE to guide intensity level when exercising independently       Able to understand and use Dyspnea scale Yes       Intervention Provide education and explanation on how to use Dyspnea scale       Expected Outcomes Short Term: Able to use Dyspnea scale daily in rehab to express subjective sense of shortness of breath during exertion;Long Term: Able to use Dyspnea scale to guide intensity level when exercising independently       Knowledge and understanding of Target Heart Rate Range (THRR) Yes       Intervention Provide education and explanation of THRR including how the numbers were predicted and where they are located for reference       Expected Outcomes Short Term: Able to use daily as guideline for intensity in rehab;Short Term: Able to state/look up THRR;Long Term: Able to use THRR to govern intensity when exercising independently       Able to check pulse independently Yes       Intervention Provide education and demonstration on how to check pulse in carotid and radial arteries.;Review the importance of being able to check your own pulse for safety during independent exercise       Expected Outcomes Short Term: Able to explain why pulse checking is important during independent exercise;Long Term: Able to check pulse independently and accurately       Understanding of Exercise Prescription Yes       Intervention Provide education, explanation, and written materials on patient's individual exercise prescription       Expected Outcomes Short Term: Able to explain program exercise prescription;Long Term: Able to explain home exercise prescription to exercise independently              Exercise Goals Re-Evaluation :  Exercise Goals Re-Evaluation    Row Name 01/10/20 1323 01/18/20 1337 01/24/20 1326 02/05/20 1311 02/22/20 1212      Exercise Goal Re-Evaluation   Exercise Goals Review Able to understand and use rate of perceived exertion (RPE) scale;Knowledge and understanding of Target Heart Rate Range (THRR);Understanding of Exercise Prescription Increase Physical Activity;Increase Strength and Stamina;Understanding of Exercise Prescription Increase Physical Activity;Increase Strength and Stamina;Understanding of Exercise Prescription Increase Physical Activity;Increase Strength and Stamina;Understanding of Exercise Prescription Increase Physical Activity;Increase Strength and Stamina;Understanding of Exercise Prescription   Comments Reviewed RPE scale, THR and program prescription  with pt today.  Pt voiced understanding and was given a copy of goals to take home. [name] is off to a good start in rehab.  He is walking in the drive way now and plans to start gardening some more.  Reviewed home exercise with pt today.  Pt plans to walk at home for exercise.  Reviewed THR, pulse, RPE, sign and symptoms, NTG use, and when to call 911 or MD.  Also discussed weather considerations and indoor options.  Pt voiced understanding. [name] is doing well in rehab.  He is up to level 6 on the NuStep. We will continue to monitor his progress. [name] is doing well in rehab.  He usually is active at home and gets to exercise on his off days.  Other than his recently heart problems, his stamina is getting better.  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 [name] is doing well in rehab.  He is now on level 6 on the XR!  We will continue to monitor his progress.   Expected Outcomes Short: Use RPE daily to regulate intensity. Long: Follow program prescription in THR. Short: Start to add in more walking  Long: Continue to build stamina. Short: Add incline on treadmill  Long: Continue to add in walking at home. Short: Continue to monitor HR  Long: Continue to walk on off days. Short: Try to add some incline on treadmill  Long: Continue  to improve stamina.   [place] Name 03/05/20 1441 03/14/20 1330 03/18/20 1053 04/15/20 1535       Exercise Goal Re-Evaluation   Exercise Goals Review Increase Physical Activity;Increase Strength and Stamina;Able to understand and use rate of perceived exertion (RPE) scale;Able to understand and use Dyspnea scale;Knowledge and understanding of Target Heart Rate Range (THRR);Able to check pulse independently;Understanding of Exercise Prescription Increase Physical Activity;Increase Strength and Stamina;Knowledge and understanding of Target Heart Rate Range (THRR) Increase Physical Activity;Increase Strength and Stamina;Understanding of Exercise Prescription Increase Physical Activity;Increase Strength and Stamina;Understanding of Exercise Prescription    Comments [name] has progressed to 2.2 mph and 2 % incline on TM. He works in [employer] range. Staff will monitor progress. Flanary is walking at home for exercise. Informed him that he is going to need to exercise when he is done with the program. He understands that he needs to keep his heart rate up for a time when he is walking. He is going to start checking his heart rate with his pulse oximeter at home. Verbalizes understanding of target heart rate. [name] is doing well in rehab.  He is up to level 6 on the XR.  We will continue to montior his progress. [name] has been doing well in rehab.  He will be out this week due to cataract surgery.  He is up to 3.6 METs on the bike.  We will continue to monitor his progress upon return.    Expected Outcomes Short:  continue to increase workload Long : increase MET level Short: use pulse ox at home to monitor heart rate. Long: maintain vital signs at home independently Short: Try to increase handweights  Long: Continue to improve stamina. Short: Clearance to return to rehab  Long: Continue to improve stamina           Discharge Exercise Prescription (Final Exercise Prescription Changes):  Exercise Prescription Changes -  04/15/20 1500      Response to Exercise   Blood Pressure (Admit) 110/56    Blood Pressure (Exercise) 128/70  Blood Pressure (Exit) 112/58    Heart Rate (Admit) 78 bpm    Heart Rate (Exercise) 123 bpm    Heart Rate (Exit) 98 bpm    Rating of Perceived Exertion (Exercise) 13    Symptoms none    Duration Continue with 30 min of aerobic exercise without signs/symptoms of physical distress.    Intensity THRR unchanged      Progression   Progression Continue to progress workloads to maintain intensity without signs/symptoms of physical distress.    Average METs 3.55      Resistance Training   Training Prescription Yes    Weight 4 lb    Reps 10-15      Interval Training   Interval Training No      Recumbant Bike   Level 3    Minutes 15    METs 3.6      NuStep   Level 5    Minutes 15    METs 3.5      Home Exercise Plan   Plans to continue exercise at Home (comment)   walking   Frequency Add 2 additional days to program exercise sessions.    Initial Home Exercises Provided 01/12/20           Nutrition:  Target Goals: Understanding of nutrition guidelines, daily intake of sodium <1564m, cholesterol <2024m calories 30% from fat and 7% or less from saturated fats, daily to have 5 or more servings of fruits and vegetables.  Education: Controlling Sodium/Reading Food Labels -Group verbal and written material supporting the discussion of sodium use in heart healthy nutrition. Review and explanation with models, verbal and written materials for utilization of the food label.   Education: General Nutrition Guidelines/Fats and Fiber: -Group instruction provided by verbal, written material, models and posters to present the general guidelines for heart healthy nutrition. Gives an explanation and review of dietary fats and fiber.   Biometrics:  Pre Biometrics - 01/08/20 1257      Pre Biometrics   Height 5' 10.4" (1.788 m)    Weight 241 lb 11.2 oz (109.6 kg)    BMI  (Calculated) 34.29    Single Leg Stand 26.06 seconds            Nutrition Therapy Plan and Nutrition Goals:  Nutrition Therapy & Goals - 02/20/20 1300      Nutrition Therapy   Diet HH, Low Na, DM    Drug/Food Interactions Statins/Certain Fruits    Protein (specify units) 85-90g    Fiber 30 grams    Whole Grain Foods 3 servings    Saturated Fats 12 max. grams    Fruits and Vegetables 5 servings/day    Sodium 1.5 grams      Personal Nutrition Goals   Nutrition Goal ST: continue with current changes LT: lose 30lbs (10 lbs so so far), Getting back into shape    Comments B: couple boiled eggs with fruit and protein shake L or S: chips with salsa, broccoli with ranch dressing, or carrot chips D: chicken with cauliflower rice. BG: ~150 randomly, A1c ~6.7. Discussed HH and DM friendly eating, CHOs, honoring hunger, tips for more satiating and satisfying meals.      Intervention Plan   Intervention Prescribe, educate and counsel regarding individualized specific dietary modifications aiming towards targeted core components such as weight, hypertension, lipid management, diabetes, heart failure and other comorbidities.;Nutrition handout(s) given to patient.    Expected Outcomes Short Term Goal: Understand basic principles of dietary content, such as  calories, fat, sodium, cholesterol and nutrients.;Short Term Goal: A plan has been developed with personal nutrition goals set during dietitian appointment.;Long Term Goal: Adherence to prescribed nutrition plan.           Nutrition Assessments:  Nutrition Assessments - 01/08/20 1259      MEDFICTS Scores   Pre Score 72           MEDIFICTS Score Key:          ?70 Need to make dietary changes          40-70 Heart Healthy Diet         ? 40 Therapeutic Level Cholesterol Diet  Nutrition Goals Re-Evaluation:   Nutrition Goals Discharge (Final Nutrition Goals Re-Evaluation):   Psychosocial: Target Goals: Acknowledge presence or  absence of significant depression and/or stress, maximize coping skills, provide positive support system. Participant is able to verbalize types and ability to use techniques and skills needed for reducing stress and depression.   Education: Depression - Provides group verbal and written instruction on the correlation between heart/lung disease and depressed mood, treatment options, and the stigmas associated with seeking treatment.   Education: Sleep Hygiene -Provides group verbal and written instruction about how sleep can affect your health.  Define sleep hygiene, discuss sleep cycles and impact of sleep habits. Review good sleep hygiene tips.     Education: Stress and Anxiety: - Provides group verbal and written instruction about the health risks of elevated stress and causes of high stress.  Discuss the correlation between heart/lung disease and anxiety and treatment options. Review healthy ways to manage with stress and anxiety.    Initial Review & Psychosocial Screening:  Initial Psych Review & Screening - 01/04/20 1040      Initial Review   Current issues with Current Stress Concerns    Source of Stress Concerns Chronic Illness      Family Dynamics   Good Support System? Yes   daughter, grandaughters     Barriers   Psychosocial barriers to participate in program There are no identifiable barriers or psychosocial needs.;The patient should benefit from training in stress management and relaxation.      Screening Interventions   Interventions Provide feedback about the scores to participant;Encouraged to exercise;To provide support and resources with identified psychosocial needs    Expected Outcomes Short Term goal: Utilizing psychosocial counselor, staff and physician to assist with identification of specific Stressors or current issues interfering with healing process. Setting desired goal for each stressor or current issue identified.;Long Term Goal: Stressors or current  issues are controlled or eliminated.;Short Term goal: Identification and review with participant of any Quality of Life or Depression concerns found by scoring the questionnaire.;Long Term goal: The participant improves quality of Life and PHQ9 Scores as seen by post scores and/or verbalization of changes           Quality of Life Scores:   Quality of Life - 01/08/20 1258      Quality of Life   Select Quality of Life      Quality of Life Scores   Health/Function Pre 17.03 %    Socioeconomic Pre 20.79 %    Psych/Spiritual Pre 20.71 %    Family Pre 27 %    GLOBAL Pre 19.82 %          Scores of 19 and below usually indicate a poorer quality of life in these areas.  A difference of  2-3 points is a clinically meaningful difference.  A difference of 2-3 points in the total score of the Quality of Life Index has been associated with significant improvement in overall quality of life, self-image, physical symptoms, and general health in studies assessing change in quality of life.  PHQ-9: Recent Review Flowsheet Data    Depression screen [facility] 2/9 03/14/2020 02/05/2020 01/08/2020 04/26/2019 04/08/2018   Decreased Interest '2 2  1 ' 0 0   Down, Depressed, Hopeless 0 0 0 0 0   PHQ - 2 Score '2 2 1 ' 0 0   Altered sleeping 0 1 1 0 0   Tired, decreased energy '3 3 2 ' 0 0   Change in appetite 0 0 1 0 0   Feeling bad or failure about yourself  0 0 0 0 0   Trouble concentrating 0 0 0 0 0   Moving slowly or fidgety/restless 0 0 0 0 0   Suicidal thoughts 0 0 0 0 0   PHQ-9 Score '5 6 5 ' 0 0   Difficult doing work/chores Not difficult at all Somewhat difficult Somewhat difficult Not difficult at all Not difficult at all     Interpretation of Total Score  Total Score Depression Severity:  1-4 = Minimal depression, 5-9 = Mild depression, 10-14 = Moderate depression, 15-19 = Moderately severe depression, 20-27 = Severe depression   Psychosocial Evaluation and Intervention:  Psychosocial Evaluation - 01/04/20  1044      Psychosocial Evaluation & Interventions   Comments Ardith reports doing well. His daughter and her two daughters live with him. The issue causing him the most stress right now is the cost of Entresto. He knows this medicine will help him a lot, it is just hard coming up with the additional $400-$500 a month. He is working with his doctor's office and pharmacy to find a plan that is more affordable. This issue currently has caused some sleep issues, but he knows it will all work out in the end. He is looking forward to coming to cardiac rehab because he really wants to work hard to feel better and have the best quality of life.    Expected Outcomes Short: attend [facility] for exercise and education. Long: develope positive self care habits    Continue Psychosocial Services  Follow up required by staff           Psychosocial Re-Evaluation:  Psychosocial Re-Evaluation    T[place]ard Name 01/18/20 1338 02/05/20 1308 03/14/20 1340         Psychosocial Re-Evaluation   Current issues with Current Stress Concerns Current Stress Concerns;Current Depression Current Stress Concerns     Comments [name] is doing well mentally and in a good place.  He has really enjoyed coming to class and feeling better overall.  He forgot to call court about his jury duty miss.  He just has a lot on his mind. [name] is doing well mentally.  He got a little scared last week after a bout of afib.  It got him not feeling well either.  It has increased his PHQ by a point, but feels good otherwise. Patient has to go get a procedure done on his heart. He is able to see family and go fishing and do things that he enjoys. He is getting an ablation done on Monday.     Expected Outcomes Short: Continue to attend rehab.  Long: Continue to stay positive. Short: Get heart back in rhythm.  Long: Continue to stay positive. Short: stay positive about his heart procedure. Long: maintain  a positive outlook on mental health.      Interventions Encouraged to attend Cardiac Rehabilitation for the exercise Encouraged to attend Cardiac Rehabilitation for the exercise Encouraged to attend Cardiac Rehabilitation for the exercise     Continue Psychosocial Services  Follow up required by staff Follow up required by staff Follow up required by staff            Psychosocial Discharge (Final Psychosocial Re-Evaluation):  Psychosocial Re-Evaluation - 03/14/20 1340      Psychosocial Re-Evaluation   Current issues with Current Stress Concerns    Comments Patient has to go get a procedure done on his heart. He is able to see family and go fishing and do things that he enjoys. He is getting an ablation done on Monday.    Expected Outcomes Short: stay positive about his heart procedure. Long: maintain a positive outlook on mental health.    Interventions Encouraged to attend Cardiac Rehabilitation for the exercise    Continue Psychosocial Services  Follow up required by staff           Vocational Rehabilitation: Provide vocational rehab assistance to qualifying candidates.   Vocational Rehab Evaluation & Intervention:  Vocational Rehab - 01/04/20 1037      Initial Vocational Rehab Evaluation & Intervention   Assessment shows need for Vocational Rehabilitation No           Education: Education Goals: Education classes will be provided on a variety of topics geared toward better understanding of heart health and risk factor modification. Participant will state understanding/return demonstration of topics presented as noted by education test scores.  Learning Barriers/Preferences:  Learning Barriers/Preferences - 01/04/20 1036      Learning Barriers/Preferences   Learning Barriers None    Learning Preferences None           General Cardiac Education Topics:  AED/CPR: - Group verbal and written instruction with the use of models to demonstrate the basic use of the AED with the basic ABC's of  resuscitation.   Anatomy & Physiology of the Heart: - Group verbal and written instruction and models provide basic cardiac anatomy and physiology, with the coronary electrical and arterial systems. Review of Valvular disease and Heart Failure   Cardiac Procedures: - Group verbal and written instruction to review commonly prescribed medications for heart disease. Reviews the medication, class of the drug, and side effects. Includes the steps to properly store meds and maintain the prescription regimen. (beta blockers and nitrates)   Cardiac Medications I: - Group verbal and written instruction to review commonly prescribed medications for heart disease. Reviews the medication, class of the drug, and side effects. Includes the steps to properly store meds and maintain the prescription regimen.   Cardiac Medications II: -Group verbal and written instruction to review commonly prescribed medications for heart disease. Reviews the medication, class of the drug, and side effects. (all other drug classes)    Go Sex-Intimacy & Heart Disease, Get SMART - Goal Setting: - Group verbal and written instruction through game format to discuss heart disease and the return to sexual intimacy. Provides group verbal and written material to discuss and apply goal setting through the application of the S.M.A.R.T. Method.   Other Matters of the Heart: - Provides group verbal, written materials and models to describe Stable Angina and Peripheral Artery. Includes description of the disease process and treatment options available to the cardiac patient.   Infection Prevention: - Provides verbal and written material to individual with  discussion of infection control including proper hand washing and proper equipment cleaning during exercise session.   Cardiac Rehab from 01/08/2020 in [facility] Cardiac and Pulmonary Rehab  Date 01/08/20  Educator [facility]  Instruction Review Code 1- Verbalizes Understanding      Falls  Prevention: - Provides verbal and written material to individual with discussion of falls prevention and safety.   Cardiac Rehab from 01/08/2020 in [facility] Cardiac and Pulmonary Rehab  Date 01/08/20  Educator [facility]  Instruction Review Code 1- Verbalizes Understanding      Other: -Provides group and verbal instruction on various topics (see comments)   Knowledge Questionnaire Score:  Knowledge Questionnaire Score - 01/08/20 1259      Knowledge Questionnaire Score   Pre Score 20/26 Education Focus: Depression, Angina, Nutrition, Exercise           Core Components/Risk Factors/Patient Goals at Admission:  Personal Goals and Risk Factors at Admission - 01/08/20 1259      Core Components/Risk Factors/Patient Goals on Admission    Weight Management Yes;Obesity;Weight Loss    Intervention Weight Management: Develop a combined nutrition and exercise program designed to reach desired caloric intake, while maintaining appropriate intake of nutrient and fiber, sodium and fats, and appropriate energy expenditure required for the weight goal.;Weight Management: Provide education and appropriate resources to help participant work on and attain dietary goals.;Weight Management/Obesity: Establish reasonable short term and long term weight goals.;Obesity: Provide education and appropriate resources to help participant work on and attain dietary goals.    Admit Weight 241 lb 11.2 oz (109.6 kg)    Goal Weight: Short Term 235 lb (106.6 kg)    Goal Weight: Long Term 230 lb (104.3 kg)    Expected Outcomes Short Term: Continue to assess and modify interventions until short term weight is achieved;Long Term: Adherence to nutrition and physical activity/exercise program aimed toward attainment of established weight goal;Weight Loss: Understanding of general recommendations for a balanced deficit meal plan, which promotes 1-2 lb weight loss per week and includes a negative energy balance of [phone]  kcal/d;Understanding recommendations for meals to include 15-35% energy as protein, 25-35% energy from fat, 35-60% energy from carbohydrates, less than 267m of dietary cholesterol, 20-35 gm of total fiber daily;Understanding of distribution of calorie intake throughout the day with the consumption of 4-5 meals/snacks    Diabetes Yes    Intervention Provide education about signs/symptoms and action to take for hypo/hyperglycemia.;Provide education about proper nutrition, including hydration, and aerobic/resistive exercise prescription along with prescribed medications to achieve blood glucose in normal ranges: Fasting glucose 65-99 mg/dL    Expected Outcomes Short Term: Participant verbalizes understanding of the signs/symptoms and immediate care of hyper/hypoglycemia, proper foot care and importance of medication, aerobic/resistive exercise and nutrition plan for blood glucose control.;Long Term: Attainment of HbA1C < 7%.    Lipids Yes    Intervention Provide education and support for participant on nutrition & aerobic/resistive exercise along with prescribed medications to achieve LDL <782m HDL >4046m   Expected Outcomes Short Term: Participant states understanding of desired cholesterol values and is compliant with medications prescribed. Participant is following exercise prescription and nutrition guidelines.;Long Term: Cholesterol controlled with medications as prescribed, with individualized exercise RX and with personalized nutrition plan. Value goals: LDL < 64m39mDL > 40 mg.           Education:Diabetes - Individual verbal and written instruction to review signs/symptoms of diabetes, desired ranges of glucose level fasting, after meals and with exercise. Acknowledge that pre  and post exercise glucose checks will be done for 3 sessions at entry of program.   Cardiac Rehab from 01/08/2020 in [facility] Cardiac and Pulmonary Rehab  Date 01/04/20  Educator [facility]  Instruction Review Code 1- [employer]  Understanding      Education: Know Your Numbers and Risk Factors: -Group verbal and written instruction about important numbers in your health.  Discussion of what are risk factors and how they play a role in the disease process.  Review of Cholesterol, Blood Pressure, Diabetes, and BMI and the role they play in your overall health.   Core Components/Risk Factors/Patient Goals Review:   Goals and Risk Factor Review    Row Name 01/18/20 1340 02/05/20 1310 03/14/20 1342         Core Components/Risk Factors/Patient Goals Review   Personal Goals Review Weight Management/Obesity;Hypertension Weight Management/Obesity;Hypertension Weight Management/Obesity;Hypertension;Diabetes     Review [name] is doing well with his weight and coming down some each day.  Blood pressures are doing well and he checks them at home.  Sugars have also been good. [name] is doing well in rehab. Weight is down some.  Blood pressures are good.  On new med reginme to manage new onset afib. Smithy wants to lose more weight. He has lost around 20 pounds since the start of the program. His blood pressure has been stable and he has no questions about his medications.     Expected Outcomes Short: Continue to work on weight loss  Long: Continue to monitor risk factors Short: Continue to work on weight loss  Long: Continue to monitor risk factors Short: continue with weight loss. Long: lose 10 more pounds.            Core Components/Risk Factors/Patient Goals at Discharge (Final Review):   Goals and Risk Factor Review - 03/14/20 1342      Core Components/Risk Factors/Patient Goals Review   Personal Goals Review Weight Management/Obesity;Hypertension;Diabetes    Review Smithy wants to lose more weight. He has lost around 20 pounds since the start of the program. His blood pressure has been stable and he has no questions about his medications.    Expected Outcomes Short: continue with weight loss. Long: lose 10 more pounds.            ITP Comments:  ITP Comments    Row Name 01/04/20 1048 01/08/20 1100 01/10/20 1323 01/31/20 1032 02/20/20 1314   ITP Comments Initial orientation phone call completed. Diagnosis can be found in [facility] 1/29. EP orientation scheduled for 3/1 at 9:30 Completed 6MWT and gym orientation.  Initial ITP created and sent for review to Dr. Emily Filbert, Medical Director. First full day of exercise!  Patient was oriented to gym and equipment including functions, settings, policies, and procedures.  Patient's individual exercise prescription and treatment plan were reviewed.  All starting workloads were established based on the results of the 6 minute walk test done at initial orientation visit.  The plan for exercise progression was also introduced and progression will be customized based on patient's performance and goals. 30 day chart review completed. ITP sent to Dr Zachery Dakins Medical Director, for review,changes as needed and signature. New to program Completed Initial RD Eval   Row Name 02/28/20 0639 03/07/20 1424 03/14/20 1317 03/27/20 0740 04/01/20 1641   ITP Comments 30 Day review completed. Medical Director review done, changes made as directed,and approval shown by signature of Market researcher. Called to check on pt.  He has missed a couple of  visits for doctor's appointments.  He has cataracts on both eyes and is scheduled for an ablation for 5/10.  He has also been feeling under the weather some this week.  He hopes to return next week. [name] is having an ablation on Monday 5/10 30 Day review completed. ITP review done, changes made as directed,and approval shown by signature of Scientist, research (life sciences). Pt has been out since 03/14/20 due to ablation procedure.  Waiting for clearance to return to program.   Row Name 04/15/20 1535 04/24/20 0637         ITP Comments Called to check on [name].  He has been using eye drops every two hours for his surgery for cataracts on Wed 6/9.  He will get  clearance to return.  He is also currently wearing a Zio patch for his afib. 30 Day review completed. Medical Director ITP review done, changes made as directed, and signed approval by Medical Director.             Comments: 30 Day review completed. Medical Director ITP review done, changes made as directed, and signed approval by Medical Director.

## 2020-04-30 ENCOUNTER — Telehealth: Payer: Self-pay

## 2020-04-30 ENCOUNTER — Other Ambulatory Visit: Payer: Self-pay | Admitting: *Deleted

## 2020-04-30 MED ORDER — AMIODARONE HCL 200 MG PO TABS
200.0000 mg | ORAL_TABLET | Freq: Every day | ORAL | 6 refills | Status: DC
Start: 2020-04-30 — End: 2020-08-26

## 2020-04-30 NOTE — Telephone Encounter (Signed)
Called pt to see if he recovered from surgery, will have another eye surgery 6/23 and will need a week to recover. Plans to return week of 7/5.

## 2020-05-01 DIAGNOSIS — H52222 Regular astigmatism, left eye: Secondary | ICD-10-CM | POA: Diagnosis not present

## 2020-05-01 DIAGNOSIS — Z961 Presence of intraocular lens: Secondary | ICD-10-CM | POA: Diagnosis not present

## 2020-05-01 DIAGNOSIS — H2511 Age-related nuclear cataract, right eye: Secondary | ICD-10-CM | POA: Diagnosis not present

## 2020-05-01 DIAGNOSIS — Z9842 Cataract extraction status, left eye: Secondary | ICD-10-CM | POA: Diagnosis not present

## 2020-05-01 DIAGNOSIS — H2512 Age-related nuclear cataract, left eye: Secondary | ICD-10-CM | POA: Diagnosis not present

## 2020-05-01 DIAGNOSIS — H5212 Myopia, left eye: Secondary | ICD-10-CM | POA: Diagnosis not present

## 2020-05-01 DIAGNOSIS — Z9841 Cataract extraction status, right eye: Secondary | ICD-10-CM | POA: Diagnosis not present

## 2020-05-02 ENCOUNTER — Other Ambulatory Visit: Payer: Self-pay | Admitting: Internal Medicine

## 2020-05-08 ENCOUNTER — Telehealth: Payer: Self-pay | Admitting: *Deleted

## 2020-05-08 ENCOUNTER — Encounter: Payer: Self-pay | Admitting: *Deleted

## 2020-05-08 DIAGNOSIS — I5022 Chronic systolic (congestive) heart failure: Secondary | ICD-10-CM

## 2020-05-08 NOTE — Telephone Encounter (Signed)
Called to check on pt.  He was headed out the door to eye doctor now.  He is just waiting on clearance.

## 2020-05-09 ENCOUNTER — Ambulatory Visit: Payer: Medicare Other | Admitting: Cardiovascular Disease

## 2020-05-11 ENCOUNTER — Other Ambulatory Visit: Payer: Self-pay | Admitting: Family

## 2020-05-11 DIAGNOSIS — I10 Essential (primary) hypertension: Secondary | ICD-10-CM

## 2020-05-11 DIAGNOSIS — I5042 Chronic combined systolic (congestive) and diastolic (congestive) heart failure: Secondary | ICD-10-CM

## 2020-05-14 ENCOUNTER — Encounter: Payer: Self-pay | Admitting: *Deleted

## 2020-05-14 DIAGNOSIS — I5022 Chronic systolic (congestive) heart failure: Secondary | ICD-10-CM

## 2020-05-15 ENCOUNTER — Encounter: Payer: Medicare Other | Attending: Cardiovascular Disease | Admitting: *Deleted

## 2020-05-15 ENCOUNTER — Other Ambulatory Visit: Payer: Self-pay

## 2020-05-15 DIAGNOSIS — E119 Type 2 diabetes mellitus without complications: Secondary | ICD-10-CM | POA: Diagnosis not present

## 2020-05-15 DIAGNOSIS — I110 Hypertensive heart disease with heart failure: Secondary | ICD-10-CM | POA: Diagnosis not present

## 2020-05-15 DIAGNOSIS — I5022 Chronic systolic (congestive) heart failure: Secondary | ICD-10-CM | POA: Diagnosis not present

## 2020-05-15 DIAGNOSIS — Z87891 Personal history of nicotine dependence: Secondary | ICD-10-CM | POA: Insufficient documentation

## 2020-05-15 NOTE — Progress Notes (Signed)
Daily Session Note  Patient Details  Name: Tim Mccullough MRN: [id number] Date of Birth: 1951/03/29 Referring Provider:     Cardiac Rehab from 01/08/2020 in [facility] Cardiac and Pulmonary Rehab  Referring Provider [name] MD      Encounter Date: 05/15/2020  Check In:  Session Check In - 05/15/20 1350      Check-In   Supervising physician immediately available to respond to emergencies See telemetry face sheet for immediately available ER MD    Location [facility]-Cardiac & Pulmonary Rehab    Staff Present Renita Papa, RN BSN;Joseph Hood RCP,RRT,BSRT;Amanda Opp, [place], ACSM CEP, Exercise Physiologist    Virtual Visit No    Medication changes reported     No    Fall or balance concerns reported    No    Warm-up and Cool-down Performed on first and last piece of equipment    Resistance Training Performed Yes    VAD Patient? No    PAD/SET Patient? No      Pain Assessment   Currently in Pain? No/denies              Social History   Tobacco Use  Smoking Status Former Smoker  . Years: 20.00  . Types: Cigarettes  . Quit date: 11/09/1998  . Years since quitting: 21.5  Smokeless Tobacco Former Systems developer  . Quit date: 06/03/2007  Tobacco Comment   no alcohol since nov 12    Goals Met:  Independence with exercise equipment Exercise tolerated well No report of cardiac concerns or symptoms Strength training completed today  Goals Unmet:  Not Applicable  Comments: Pt able to follow exercise prescription today without complaint.  Will continue to monitor for progression.    Dr. Emily Filbert is Medical Director for [facility].

## 2020-05-16 ENCOUNTER — Other Ambulatory Visit: Payer: Self-pay

## 2020-05-16 ENCOUNTER — Encounter: Payer: Medicare Other | Admitting: *Deleted

## 2020-05-16 DIAGNOSIS — I5022 Chronic systolic (congestive) heart failure: Secondary | ICD-10-CM

## 2020-05-16 DIAGNOSIS — E119 Type 2 diabetes mellitus without complications: Secondary | ICD-10-CM | POA: Diagnosis not present

## 2020-05-16 DIAGNOSIS — Z87891 Personal history of nicotine dependence: Secondary | ICD-10-CM | POA: Diagnosis not present

## 2020-05-16 DIAGNOSIS — I110 Hypertensive heart disease with heart failure: Secondary | ICD-10-CM | POA: Diagnosis not present

## 2020-05-16 NOTE — Progress Notes (Signed)
Daily Session Note  Patient Details  Name: Tim Mccullough MRN: [id number] Date of Birth: 15-Dec-1950 Referring Provider:     Cardiac Rehab from 01/08/2020 in [facility] Cardiac and Pulmonary Rehab  Referring Provider [name] MD      Encounter Date: 05/16/2020  Check In:  Session Check In - 05/16/20 1332      Check-In   Supervising physician immediately available to respond to emergencies See telemetry face sheet for immediately available ER MD    Location [facility]-Cardiac & Pulmonary Rehab    Staff Present Renita Papa, RN BSN;Joseph [address] [place], [place], RCEP, CCRP, CCET    Virtual Visit No    Medication changes reported     No    Fall or balance concerns reported    No    Warm-up and Cool-down Performed on first and last piece of equipment    Resistance Training Performed Yes    VAD Patient? No    PAD/SET Patient? No      Pain Assessment   Currently in Pain? No/denies              Social History   Tobacco Use  Smoking Status Former Smoker  . Years: 20.00  . Types: Cigarettes  . Quit date: 11/09/1998  . Years since quitting: 21.5  Smokeless Tobacco Former Systems developer  . Quit date: 06/03/2007  Tobacco Comment   no alcohol since nov 12    Goals Met:  Independence with exercise equipment Exercise tolerated well No report of cardiac concerns or symptoms Strength training completed today  Goals Unmet:  Not Applicable  Comments: Pt able to follow exercise prescription today without complaint.  Will continue to monitor for progression.    Dr. Emily Filbert is Medical Director for [facility].

## 2020-05-20 ENCOUNTER — Other Ambulatory Visit: Payer: Self-pay

## 2020-05-20 ENCOUNTER — Encounter: Payer: Medicare Other | Admitting: *Deleted

## 2020-05-20 DIAGNOSIS — E119 Type 2 diabetes mellitus without complications: Secondary | ICD-10-CM | POA: Diagnosis not present

## 2020-05-20 DIAGNOSIS — Z87891 Personal history of nicotine dependence: Secondary | ICD-10-CM | POA: Diagnosis not present

## 2020-05-20 DIAGNOSIS — I5022 Chronic systolic (congestive) heart failure: Secondary | ICD-10-CM | POA: Diagnosis not present

## 2020-05-20 DIAGNOSIS — I110 Hypertensive heart disease with heart failure: Secondary | ICD-10-CM | POA: Diagnosis not present

## 2020-05-20 NOTE — Progress Notes (Signed)
Daily Session Note  Patient Details  Name: Tim Mccullough MRN: [id number] Date of Birth: 05-26-1951 Referring Provider:     Cardiac Rehab from 01/08/2020 in [facility]  Referring Provider [name] MD      Encounter Date: 05/20/2020  Check In:  Session Check In - 05/20/20 1348      Check-In   Supervising physician immediately available to respond to emergencies See telemetry face sheet for immediately available ER MD    Location [facility]    Staff Present Renita Papa, RN Vickki Hearing, BA, ACSM CEP, Exercise Physiologist;Kara Eliezer Bottom, MS Exercise Physiologist;Kelly Amedeo Plenty, BS, ACSM CEP, Exercise Physiologist    Virtual Visit No    Medication changes reported     No    Fall or balance concerns reported    No    Warm-up and Cool-down Performed on first and last piece of equipment    Resistance Training Performed Yes    VAD Patient? No    PAD/SET Patient? No      Pain Assessment   Currently in Pain? No/denies              Social History   Tobacco Use  Smoking Status Former Smoker  . Years: 20.00  . Types: Cigarettes  . Quit date: 11/09/1998  . Years since quitting: 21.5  Smokeless Tobacco Former Systems developer  . Quit date: 06/03/2007  Tobacco Comment   no alcohol since nov 12    Goals Met:  Independence with exercise equipment Exercise tolerated well No report of cardiac concerns or symptoms Strength training completed today  Goals Unmet:  Not Applicable  Comments: Pt able to follow exercise prescription today without complaint.  Will continue to monitor for progression.    Dr. Emily Filbert is Medical Director for [place] [facility].

## 2020-05-22 ENCOUNTER — Other Ambulatory Visit: Payer: Self-pay

## 2020-05-22 ENCOUNTER — Encounter: Payer: Self-pay | Admitting: *Deleted

## 2020-05-22 ENCOUNTER — Encounter: Payer: Medicare Other | Admitting: *Deleted

## 2020-05-22 DIAGNOSIS — I5022 Chronic systolic (congestive) heart failure: Secondary | ICD-10-CM

## 2020-05-22 DIAGNOSIS — E119 Type 2 diabetes mellitus without complications: Secondary | ICD-10-CM | POA: Diagnosis not present

## 2020-05-22 DIAGNOSIS — I110 Hypertensive heart disease with heart failure: Secondary | ICD-10-CM | POA: Diagnosis not present

## 2020-05-22 DIAGNOSIS — Z87891 Personal history of nicotine dependence: Secondary | ICD-10-CM | POA: Diagnosis not present

## 2020-05-22 NOTE — Progress Notes (Signed)
Cardiac Individual Treatment Plan  Patient Details  Name: Tim Mccullough MRN: [id number] Date of Birth: December 07, 1950 Referring Provider:     Cardiac Rehab from 01/08/2020 in [facility] Cardiac and Pulmonary Rehab  Referring Provider [name] MD      Initial Encounter Date:    Cardiac Rehab from 01/08/2020 in [facility] Cardiac and Pulmonary Rehab  Date 01/08/20      Visit Diagnosis: Heart failure, chronic systolic ([place])  Patient's Home Medications on Admission:  Current Outpatient Medications:  .  amiodarone (PACERONE) 200 MG tablet, Take 1 tablet (200 mg total) by mouth daily., Disp: 30 tablet, Rfl: 6 .  aspirin 81 MG tablet, Take 81 mg by mouth daily. , Disp: , Rfl:  .  atorvastatin (LIPITOR) 40 MG tablet, TAKE 1 TABLET BY MOUTH EVERY DAY (Patient taking differently: Take 40 mg by mouth daily. TAKE 1 TABLET BY MOUTH EVERY DAY), Disp: 90 tablet, Rfl: 3 .  doxazosin (CARDURA) 1 MG tablet, 1 tablet daily (Patient taking differently: Take 1 mg by mouth daily. 1 tablet daily), Disp: 90 tablet, Rfl: 3 .  fish oil-omega-3 fatty acids 1000 MG capsule, Take 1 g by mouth 2 (two) times daily. , Disp: , Rfl:  .  gatifloxacin (ZYMAXID) 0.5 % SOLN, Place 1 drop into the left eye 4 (four) times daily., Disp: , Rfl:  .  losartan (COZAAR) 25 MG tablet, Take 1 tablet (25 mg) by mouth once daily at bedtime, Disp: , Rfl:  .  magnesium oxide (MAG-OX) 400 (241.3 Mg) MG tablet, Take 1 tablet (400 mg total) by mouth daily., Disp: 30 tablet, Rfl: 0 .  metFORMIN (GLUCOPHAGE) 500 MG tablet, TAKE 2 TABLETS (1,000 MG TOTAL) BY MOUTH 2 (TWO) TIMES DAILY WITH A MEAL., Disp: 360 tablet, Rfl: 3 .  metoprolol tartrate (LOPRESSOR) 25 MG tablet, TAKE 1.5 TABLETS (37.5 MG TOTAL) BY MOUTH 2 (TWO) TIMES DAILY., Disp: 270 tablet, Rfl: 3 .  omeprazole (PRILOSEC) 20 MG capsule, Take 1 capsule (20 mg total) by mouth daily., Disp: 30 capsule, Rfl: 11 .  ONE TOUCH ULTRA TEST test strip, CHECK BLOOD SUGAR DAILY AND IF BLOOD SUGAR  FLUCTUATES. 250.00, Disp: 100 each, Rfl: 11 .  OPCON-A 0.027-0.315 % SOLN, Apply 1 drop to eye every morning., Disp: , Rfl:  .  PARoxetine (PAXIL) 20 MG tablet, Take 1 tablet (20 mg total) by mouth daily., Disp: 90 tablet, Rfl: 3 .  spironolactone (ALDACTONE) 25 MG tablet, Take 0.5 tablet (12.5 mg) by mouth once daily at bedtime (Patient taking differently: Take 25 mg by mouth daily. ), Disp: , Rfl:  .  triamcinolone ointment (KENALOG) 0.1 %, Apply 1 application topically 2 (two) times daily as needed (irritation). , Disp: , Rfl:   Past Medical History: Past Medical History:  Diagnosis Date  . Abnormal echocardiogram 2/11   Repeated after med mgmt of cardiomyopathy showed EF 40-45%, mildly dilated LV, mild LV hypertrophy, anterolateral and apical hypokinesis, mild diastolic dysfunction.  . Anemia    hx  . Arthritis   . Blood transfusion without reported diagnosis 09/2012  . BPH (benign prostatic hypertrophy) 2004  . Cancer ([place])    melanoma back  . Cardiomyopathy    ? tachycardia-mediated  . Depression   . Diabetes mellitus 11/2006   Type II  . Diverticulosis   . Echocardiogram abnormal 7/10   Mild LVH with a mildly dilated LV.  EF was difficult to assess given poor acoustic windows but appeared moderately decreased.  Mild MR.  Probable  small perimembranous VSD, mild LAE  . Esophageal erosions   . GERD (gastroesophageal reflux disease)   . Hip pain   . History of MRI 7/10   Cardiac MRI was done to followup difficult echo.  This showed mild to moderately dilated LV, mild to moderate LAE, EF  33% with global hypokinesis, normal RV size with mild systolic dysfunction.  There was no large VSD evident.  There was no myocardial delayed enhancement so no evidence for infiltrative disease or infarction.  LHC (7/10) showed only luminal irreg. in the coronaries and EF 35%  . Holter monitor, abnormal 7/10   Frequent PAC's and PVC's  Average HR 96  (but he had started Toprol XL).  HR range 71-130   . Hyperlipidemia 04/1998  . Hypertension 1995  . Obesity   . OSA (obstructive sleep apnea)    Not using CPAP- improved with weight loss  . Pneumonia    HX OF pna  . Sinus tachycardia    Pt says he has been told his heart rate is high since he was in high school.   . Sleep apnea    has cpap; but does not use  . VSD (ventricular septal defect)    Probable small perimembranous    Tobacco Use: Social History   Tobacco Use  Smoking Status Former Smoker  . Years: 20.00  . Types: Cigarettes  . Quit date: 11/09/1998  . Years since quitting: 21.5  Smokeless Tobacco Former Systems developer  . Quit date: 06/03/2007  Tobacco Comment   no alcohol since nov 12    Labs: Recent Review Flowsheet Data    Labs for ITP Cardiac and Pulmonary Rehab Latest Ref Rng & Units 01/20/2019 04/26/2019 08/21/2019 11/28/2019 01/29/2020   Cholestrol 0 - 200 mg/dL - 246(H) 142 - -   LDLCALC 0 - 99 mg/dL - 169(H) 76 - -   LDLDIRECT mg/dL - - - - -   HDL >39.00 mg/dL - 43.40 38.70(L) - -   Trlycerides 0 - 149 mg/dL - 172.0(H) 137.0 - -   Hemoglobin A1c 4.8 - 5.6 % 6.9(A) 7.0(H) 7.0(H) 6.8(H) 6.7(H)   PHART 7.35 - 7.45 - - - - -   PCO2ART 35 - 45 mmHg - - - - -   HCO3 20.0 - 24.0 mEq/L - - - - -   TCO2 0 - 100 mmol/L - - - - -   O2SAT % - - - - -       Exercise Target Goals: Exercise Program Goal: Individual exercise prescription set using results from initial 6 min walk test and THRR while considering  patient's activity barriers and safety.   Exercise Prescription Goal: Initial exercise prescription builds to 30-45 minutes a day of aerobic activity, 2-3 days per week.  Home exercise guidelines will be given to patient during program as part of exercise prescription that the participant will acknowledge.   Education: Aerobic Exercise & Resistance Training: - Gives group verbal and written instruction on the various components of exercise. Focuses on aerobic and resistive training programs and the benefits of this  training and how to safely progress through these programs..   Education: Exercise & Equipment Safety: - Individual verbal instruction and demonstration of equipment use and safety with use of the equipment.   Cardiac Rehab from 01/08/2020 in [facility] Cardiac and Pulmonary Rehab  Date 01/08/20  Educator [facility]  Instruction Review Code 1- Verbalizes Understanding      Education: Exercise Physiology & General Exercise Guidelines: -  Group verbal and written instruction with models to review the exercise physiology of the cardiovascular system and associated critical values. Provides general exercise guidelines with specific guidelines to those with heart or lung disease.    Education: Flexibility, Balance, Mind/Body Relaxation: Provides group verbal/written instruction on the benefits of flexibility and balance training, including mind/body exercise modes such as yoga, pilates and tai chi.  Demonstration and skill practice provided.   Activity Barriers & Risk Stratification:  Activity Barriers & Cardiac Risk Stratification - 01/08/20 1102      Activity Barriers & Cardiac Risk Stratification   Activity Barriers Joint Problems;Back Problems;Left Hip Replacement;Right Knee Replacement;Deconditioning;Muscular Weakness    Cardiac Risk Stratification High           6 Minute Walk:  6 Minute Walk    Row Name 01/08/20 1101         6 Minute Walk   Phase Initial     Distance 1163 feet     Walk Time 6 minutes     # of Rest Breaks 0     MPH 2.2     METS 2.71     RPE 11     VO2 Peak 9.49     Symptoms Yes (comment)     Comments Left hip starting to be bothersome at end 6/10     Resting HR 91 bpm     Resting BP 126/70     Resting Oxygen Saturation  96 %     Exercise Oxygen Saturation  during 6 min walk 93 %     Max Ex. HR 123 bpm     Max Ex. BP 142/70     2 Minute Post BP 130/60            Oxygen Initial Assessment:   Oxygen Re-Evaluation:   Oxygen Discharge (Final Oxygen  Re-Evaluation):   Initial Exercise Prescription:  Initial Exercise Prescription - 01/08/20 1100      Date of Initial Exercise RX and Referring Provider   Date 01/08/20    Referring Provider [name] MD      Treadmill   MPH 2.2    Grade 0    Minutes 15    METs 2.68      Recumbant Elliptical   Level 1    RPM 50    Minutes 15    METs 2.7      Elliptical   Level 1    Speed 2.5    Minutes 15      REL-XR   Level 2    Speed 50    Minutes 15    METs 2.7      Prescription Details   Frequency (times per week) 3    Duration Progress to 30 minutes of continuous aerobic without signs/symptoms of physical distress      Intensity   THRR 40-80% of Max Heartrate 115-139    Ratings of Perceived Exertion 11-13    Perceived Dyspnea 0-4      Progression   Progression Continue to progress workloads to maintain intensity without signs/symptoms of physical distress.      Resistance Training   Training Prescription Yes    Weight 4 lb           Perform Capillary Blood Glucose checks as needed.  Exercise Prescription Changes:  Exercise Prescription Changes    Row Name 01/08/20 1100 01/24/20 1300 02/07/20 1500 02/22/20 1200 03/05/20 1400     Response to Exercise   Blood Pressure (Admit)  126/70 118/68 134/70 104/56 102/62   Blood Pressure (Exercise) 142/74 128/74 134/64 128/60 124/68   Blood Pressure (Exit) 130/60 124/70 124/64 118/60 100/62   Heart Rate (Admit) 97 bpm 94 bpm 101 bpm 90 bpm 99 bpm   Heart Rate (Exercise) 123 bpm 105 bpm 121 bpm 133 bpm 134 bpm   Heart Rate (Exit) 94 bpm 97 bpm 102 bpm 101 bpm 104 bpm   Oxygen Saturation (Admit) 96 % -- -- -- --   Oxygen Saturation (Exercise) 93 % -- -- -- --   Rating of Perceived Exertion (Exercise) _0 Perceived Dyspnea (Exercise) 0 -- -- -- --   Symptoms hip bothersome at end 6/10 none none none none   Comments walk test results -- -- -- --   Duration -- Continue with 30 min of aerobic exercise without  signs/symptoms of physical distress. Continue with 30 min of aerobic exercise without signs/symptoms of physical distress. Continue with 30 min of aerobic exercise without signs/symptoms of physical distress. Continue with 30 min of aerobic exercise without signs/symptoms of physical distress.   Intensity -- THRR unchanged THRR unchanged THRR unchanged THRR unchanged     Progression   Progression -- Continue to progress workloads to maintain intensity without signs/symptoms of physical distress. Continue to progress workloads to maintain intensity without signs/symptoms of physical distress. Continue to progress workloads to maintain intensity without signs/symptoms of physical distress. Continue to progress workloads to maintain intensity without signs/symptoms of physical distress.   Average METs -- 2.77 3.06 3.3 3.45     Resistance Training   Training Prescription -- Yes Yes Yes Yes   Weight -- 4 lb 4 lb 4 lb 4 lb   Reps -- 10-15 10-15 10-15 10-15     Interval Training   Interval Training -- No No No No     Treadmill   MPH -- 2.2 2.2 2.2 2.2   Grade -- 0 0 0 2   Minutes -- _1 METs -- 2.68 2.68 2.68 3.3     Recumbant Bike   Level -- 4 2.5 4 --   Minutes -- _2 --   METs -- 3.7 3.6 4.4 --     NuStep   Level -- _3 --   Minutes -- _4 --   METs -- 2.5 2.9 3.3 --     Recumbant Elliptical   Level -- 2 -- -- --   Minutes -- 15 -- -- --   METs -- 2.2 -- -- --     REL-XR   Level -- -- _5 Speed -- -- 3 -- --   Minutes -- -- _6 METs -- -- -- 2.8 3.6     Home Exercise Plan   Plans to continue exercise at -- Home (comment)  walking Home (comment)  walking Home (comment)  walking Home (comment)  walking   Frequency -- Add 2 additional days to program exercise sessions. Add 2 additional days to program exercise sessions. Add 2 additional days to program exercise sessions. Add 2 additional days to program exercise sessions.   Initial Home Exercises  Provided -- 01/12/20 01/12/20 01/12/20 01/12/20   Row Name 03/18/20 1000 04/15/20 1500           Response to Exercise   Blood Pressure (Admit) 102/60 110/56      Blood Pressure (Exercise) 132/58 128/70  Blood Pressure (Exit) 122/64 112/58      Heart Rate (Admit) 95 bpm 78 bpm      Heart Rate (Exercise) 134 bpm 123 bpm      Heart Rate (Exit) 121 bpm 98 bpm      Rating of Perceived Exertion (Exercise) 13 13      Symptoms none none      Duration Continue with 30 min of aerobic exercise without signs/symptoms of physical distress. Continue with 30 min of aerobic exercise without signs/symptoms of physical distress.      Intensity THRR unchanged THRR unchanged        Progression   Progression Continue to progress workloads to maintain intensity without signs/symptoms of physical distress. Continue to progress workloads to maintain intensity without signs/symptoms of physical distress.      Average METs 3.61 3.55        Resistance Training   Training Prescription Yes Yes      Weight 4 lb 4 lb      Reps 10-15 10-15        Interval Training   Interval Training No No        Treadmill   MPH 2.5 --      Grade 3.5 --      Minutes 15 --      METs 4.12 --        Recumbant Bike   Level 4 3      Minutes 15 15      METs 4.3 3.6        NuStep   Level 6 5      Minutes 15 15      METs 3 3.5        REL-XR   Level 6 --      Minutes 15 --      METs 3 --        Home Exercise Plan   Plans to continue exercise at Home (comment)  walking Home (comment)  walking      Frequency Add 2 additional days to program exercise sessions. Add 2 additional days to program exercise sessions.      Initial Home Exercises Provided 01/12/20 01/12/20             Exercise Comments:  Exercise Comments    Row Name 01/10/20 1323           Exercise Comments First full day of exercise!  Patient was oriented to gym and equipment including functions, settings, policies, and procedures.  Patient's  individual exercise prescription and treatment plan were reviewed.  All starting workloads were established based on the results of the 6 minute walk test done at initial orientation visit.  The plan for exercise progression was also introduced and progression will be customized based on patient's performance and goals.              Exercise Goals and Review:  Exercise Goals    Row Name 01/08/20 1255             Exercise Goals   Increase Physical Activity Yes       Intervention Provide advice, education, support and counseling about physical activity/exercise needs.;Develop an individualized exercise prescription for aerobic and resistive training based on initial evaluation findings, risk stratification, comorbidities and participant's personal goals.       Expected Outcomes Short Term: Attend rehab on a regular basis to increase amount of physical activity.;Long Term: Add in home exercise to make exercise part of  routine and to increase amount of physical activity.;Long Term: Exercising regularly at least 3-5 days a week.       Increase Strength and Stamina Yes       Intervention Provide advice, education, support and counseling about physical activity/exercise needs.;Develop an individualized exercise prescription for aerobic and resistive training based on initial evaluation findings, risk stratification, comorbidities and participant's personal goals.       Expected Outcomes Short Term: Increase workloads from initial exercise prescription for resistance, speed, and METs.;Short Term: Perform resistance training exercises routinely during rehab and add in resistance training at home;Long Term: Improve cardiorespiratory fitness, muscular endurance and strength as measured by increased METs and functional capacity (6MWT)       Able to understand and use rate of perceived exertion (RPE) scale Yes       Intervention Provide education and explanation on how to use RPE scale       Expected  Outcomes Short Term: Able to use RPE daily in rehab to express subjective intensity level;Long Term:  Able to use RPE to guide intensity level when exercising independently       Able to understand and use Dyspnea scale Yes       Intervention Provide education and explanation on how to use Dyspnea scale       Expected Outcomes Short Term: Able to use Dyspnea scale daily in rehab to express subjective sense of shortness of breath during exertion;Long Term: Able to use Dyspnea scale to guide intensity level when exercising independently       Knowledge and understanding of Target Heart Rate Range (THRR) Yes       Intervention Provide education and explanation of THRR including how the numbers were predicted and where they are located for reference       Expected Outcomes Short Term: Able to use daily as guideline for intensity in rehab;Short Term: Able to state/look up THRR;Long Term: Able to use THRR to govern intensity when exercising independently       Able to check pulse independently Yes       Intervention Provide education and demonstration on how to check pulse in carotid and radial arteries.;Review the importance of being able to check your own pulse for safety during independent exercise       Expected Outcomes Short Term: Able to explain why pulse checking is important during independent exercise;Long Term: Able to check pulse independently and accurately       Understanding of Exercise Prescription Yes       Intervention Provide education, explanation, and written materials on patient's individual exercise prescription       Expected Outcomes Short Term: Able to explain program exercise prescription;Long Term: Able to explain home exercise prescription to exercise independently              Exercise Goals Re-Evaluation :  Exercise Goals Re-Evaluation    Row Name 01/10/20 1323 01/18/20 1337 01/24/20 1326 02/05/20 1311 02/22/20 1212     Exercise Goal Re-Evaluation   Exercise Goals  Review Able to understand and use rate of perceived exertion (RPE) scale;Knowledge and understanding of Target Heart Rate Range (THRR);Understanding of Exercise Prescription Increase Physical Activity;Increase Strength and Stamina;Understanding of Exercise Prescription Increase Physical Activity;Increase Strength and Stamina;Understanding of Exercise Prescription Increase Physical Activity;Increase Strength and Stamina;Understanding of Exercise Prescription Increase Physical Activity;Increase Strength and Stamina;Understanding of Exercise Prescription   Comments Reviewed RPE scale, THR and program prescription with pt today.  Pt voiced understanding and was given  a copy of goals to take home. [name] is off to a good start in rehab.  He is walking in the drive way now and plans to start gardening some more.  Reviewed home exercise with pt today.  Pt plans to walk at home for exercise.  Reviewed THR, pulse, RPE, sign and symptoms, NTG use, and when to call 911 or MD.  Also discussed weather considerations and indoor options.  Pt voiced understanding. [name] is doing well in rehab.  He is up to level 6 on the NuStep. We will continue to monitor his progress. [name] is doing well in rehab.  He usually is active at home and gets to exercise on his off days.  Other than his recently heart problems, his stamina is getting better.  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 [name] is doing well in rehab.  He is now on level 6 on the XR!  We will continue to monitor his progress.   Expected Outcomes Short: Use RPE daily to regulate intensity. Long: Follow program prescription in THR. Short: Start to add in more walking  Long: Continue to build stamina. Short: Add incline on treadmill  Long: Continue to add in walking at home. Short: Continue to monitor HR  Long: Continue to walk on off days. Short: Try to add some incline on treadmill  Long: Continue to improve stamina.   Row Name 03/05/20 1441  03/14/20 1330 03/18/20 1053 04/15/20 1535 05/14/20 1357     Exercise Goal Re-Evaluation   Exercise Goals Review Increase Physical Activity;Increase Strength and Stamina;Able to understand and use rate of perceived exertion (RPE) scale;Able to understand and use Dyspnea scale;Knowledge and understanding of Target Heart Rate Range (THRR);Able to check pulse independently;Understanding of Exercise Prescription Increase Physical Activity;Increase Strength and Stamina;Knowledge and understanding of Target Heart Rate Range (THRR) Increase Physical Activity;Increase Strength and Stamina;Understanding of Exercise Prescription Increase Physical Activity;Increase Strength and Stamina;Understanding of Exercise Prescription --   Comments [name] has progressed to 2.2 mph and 2 % incline on TM. He works in [employer] range. Staff will monitor progress. Orrison is walking at home for exercise. Informed him that he is going to need to exercise when he is done with the program. He understands that he needs to keep his heart rate up for a time when he is walking. He is going to start checking his heart rate with his pulse oximeter at home. Verbalizes understanding of target heart rate. [name] is doing well in rehab.  He is up to level 6 on the XR.  We will continue to montior his progress. [name] has been doing well in rehab.  He will be out this week due to cataract surgery.  He is up to 3.6 METs on the bike.  We will continue to monitor his progress upon return. Out since last review.   Expected Outcomes Short:  continue to increase workload Long : increase MET level Short: use pulse ox at home to monitor heart rate. Long: maintain vital signs at home independently Short: Try to increase handweights  Long: Continue to improve stamina. Short: Clearance to return to rehab  Long: Continue to improve stamina --   Row Name 05/15/20 1403             Exercise Goal Re-Evaluation   Exercise Goals Review Increase Physical  Activity;Increase Strength and Stamina;Understanding of Exercise Prescription       Comments [name] returned today!  He has been recovering from surgery  for his eyes.  He is eager to finish up and get back to it.  He was able to get back to his workloads and even increased his bike some.       Expected Outcomes Short: Continue to attend regularly Long: Continue to improve stamina              Discharge Exercise Prescription (Final Exercise Prescription Changes):  Exercise Prescription Changes - 04/15/20 1500      Response to Exercise   Blood Pressure (Admit) 110/56    Blood Pressure (Exercise) 128/70    Blood Pressure (Exit) 112/58    Heart Rate (Admit) 78 bpm    Heart Rate (Exercise) 123 bpm    Heart Rate (Exit) 98 bpm    Rating of Perceived Exertion (Exercise) 13    Symptoms none    Duration Continue with 30 min of aerobic exercise without signs/symptoms of physical distress.    Intensity THRR unchanged      Progression   Progression Continue to progress workloads to maintain intensity without signs/symptoms of physical distress.    Average METs 3.55      Resistance Training   Training Prescription Yes    Weight 4 lb    Reps 10-15      Interval Training   Interval Training No      Recumbant Bike   Level 3    Minutes 15    METs 3.6      NuStep   Level 5    Minutes 15    METs 3.5      Home Exercise Plan   Plans to continue exercise at Home (comment)   walking   Frequency Add 2 additional days to program exercise sessions.    Initial Home Exercises Provided 01/12/20           Nutrition:  Target Goals: Understanding of nutrition guidelines, daily intake of sodium '1500mg'$ , cholesterol '200mg'$ , calories 30% from fat and 7% or less from saturated fats, daily to have 5 or more servings of fruits and vegetables.  Education: Controlling Sodium/Reading Food Labels -Group verbal and written material supporting the discussion of sodium use in heart healthy nutrition.  Review and explanation with models, verbal and written materials for utilization of the food label.   Education: General Nutrition Guidelines/Fats and Fiber: -Group instruction provided by verbal, written material, models and posters to present the general guidelines for heart healthy nutrition. Gives an explanation and review of dietary fats and fiber.   Biometrics:  Pre Biometrics - 01/08/20 1257      Pre Biometrics   Height 5' 10.4" (1.788 m)    Weight 241 lb 11.2 oz (109.6 kg)    BMI (Calculated) 34.29    Single Leg Stand 26.06 seconds            Nutrition Therapy Plan and Nutrition Goals:  Nutrition Therapy & Goals - 02/20/20 1300      Nutrition Therapy   Diet HH, Low Na, DM    Drug/Food Interactions Statins/Certain Fruits    Protein (specify units) 85-90g    Fiber 30 grams    Whole Grain Foods 3 servings    Saturated Fats 12 max. grams    Fruits and Vegetables 5 servings/day    Sodium 1.5 grams      Personal Nutrition Goals   Nutrition Goal ST: continue with current changes LT: lose 30lbs (10 lbs so so far), Getting back into shape    Comments B: couple boiled eggs  with fruit and protein shake L or S: chips with salsa, broccoli with ranch dressing, or carrot chips D: chicken with cauliflower rice. BG: ~150 randomly, A1c ~6.7. Discussed HH and DM friendly eating, CHOs, honoring hunger, tips for more satiating and satisfying meals.      Intervention Plan   Intervention Prescribe, educate and counsel regarding individualized specific dietary modifications aiming towards targeted core components such as weight, hypertension, lipid management, diabetes, heart failure and other comorbidities.;Nutrition handout(s) given to patient.    Expected Outcomes Short Term Goal: Understand basic principles of dietary content, such as calories, fat, sodium, cholesterol and nutrients.;Short Term Goal: A plan has been developed with personal nutrition goals set during dietitian  appointment.;Long Term Goal: Adherence to prescribed nutrition plan.           Nutrition Assessments:  Nutrition Assessments - 01/08/20 1259      MEDFICTS Scores   Pre Score 72           MEDIFICTS Score Key:          ?70 Need to make dietary changes          40-70 Heart Healthy Diet         ? 40 Therapeutic Level Cholesterol Diet  Nutrition Goals Re-Evaluation:  Nutrition Goals Re-Evaluation    [place] Name 05/15/20 1410             Goals   Nutrition Goal ST: continue with current changes LT: lose 30lbs (10 lbs so so far), Getting back into shape       Comment [name] has been doing well with his diet and continues to lose weight. So he plans to continue with changes       Expected Outcome Short: Continue with current changes  Long: Continue to work on weight loss              Nutrition Goals Discharge (Final Nutrition Goals Re-Evaluation):  Nutrition Goals Re-Evaluation - 05/15/20 1410      Goals   Nutrition Goal ST: continue with current changes LT: lose 30lbs (10 lbs so so far), Getting back into shape    Comment [name] has been doing well with his diet and continues to lose weight. So he plans to continue with changes    Expected Outcome Short: Continue with current changes  Long: Continue to work on weight loss           Psychosocial: Target Goals: Acknowledge presence or absence of significant depression and/or stress, maximize coping skills, provide positive support system. Participant is able to verbalize types and ability to use techniques and skills needed for reducing stress and depression.   Education: Depression - Provides group verbal and written instruction on the correlation between heart/lung disease and depressed mood, treatment options, and the stigmas associated with seeking treatment.   Education: Sleep Hygiene -Provides group verbal and written instruction about how sleep can affect your health.  Define sleep hygiene, discuss sleep cycles and  impact of sleep habits. Review good sleep hygiene tips.     Education: Stress and Anxiety: - Provides group verbal and written instruction about the health risks of elevated stress and causes of high stress.  Discuss the correlation between heart/lung disease and anxiety and treatment options. Review healthy ways to manage with stress and anxiety.    Initial Review & Psychosocial Screening:  Initial Psych Review & Screening - 01/04/20 1040      Initial Review   Current issues with Current Stress Concerns  Source of Stress Concerns Chronic Illness      Family Dynamics   Good Support System? Yes   daughter, grandaughters     Barriers   Psychosocial barriers to participate in program There are no identifiable barriers or psychosocial needs.;The patient should benefit from training in stress management and relaxation.      Screening Interventions   Interventions Provide feedback about the scores to participant;Encouraged to exercise;To provide support and resources with identified psychosocial needs    Expected Outcomes Short Term goal: Utilizing psychosocial counselor, staff and physician to assist with identification of specific Stressors or current issues interfering with healing process. Setting desired goal for each stressor or current issue identified.;Long Term Goal: Stressors or current issues are controlled or eliminated.;Short Term goal: Identification and review with participant of any Quality of Life or Depression concerns found by scoring the questionnaire.;Long Term goal: The participant improves quality of Life and PHQ9 Scores as seen by post scores and/or verbalization of changes           Quality of Life Scores:   Quality of Life - 01/08/20 1258      Quality of Life   Select Quality of Life      Quality of Life Scores   Health/Function Pre 17.03 %    Socioeconomic Pre 20.79 %    Psych/Spiritual Pre 20.71 %    Family Pre 27 %    GLOBAL Pre 19.82 %           Scores of 19 and below usually indicate a poorer quality of life in these areas.  A difference of  2-3 points is a clinically meaningful difference.  A difference of 2-3 points in the total score of the Quality of Life Index has been associated with significant improvement in overall quality of life, self-image, physical symptoms, and general health in studies assessing change in quality of life.  PHQ-9: Recent Review Flowsheet Data    Depression screen [facility] 2/9 03/14/2020 02/05/2020 01/08/2020 04/26/2019 04/08/2018   Decreased Interest _0 0 0   Down, Depressed, Hopeless 0 0 0 0 0   PHQ - 2 Score _1 0 0   Altered sleeping 0 1 1 0 0   Tired, decreased energy _2 0 0   Change in appetite 0 0 1 0 0   Feeling bad or failure about yourself  0 0 0 0 0   Trouble concentrating 0 0 0 0 0   Moving slowly or fidgety/restless 0 0 0 0 0   Suicidal thoughts 0 0 0 0 0   PHQ-9 Score _3 0 0   Difficult doing work/chores Not difficult at all Somewhat difficult Somewhat difficult Not difficult at all Not difficult at all     Interpretation of Total Score  Total Score Depression Severity:  1-4 = Minimal depression, 5-9 = Mild depression, 10-14 = Moderate depression, 15-19 = Moderately severe depression, 20-27 = Severe depression   Psychosocial Evaluation and Intervention:  Psychosocial Evaluation - 01/04/20 1044      Psychosocial Evaluation & Interventions   Comments Jaquon reports doing well. His daughter and her two daughters live with him. The issue causing him the most stress right now is the cost of Entresto. He knows this medicine will help him a lot, it is just hard coming up with the additional $400-$500 a month. He is working with his doctor's office and pharmacy to find a plan that is more  affordable. This issue currently has caused some sleep issues, but he knows it will all work out in the end. He is looking forward to coming to cardiac rehab because he really wants to work hard to feel  better and have the best quality of life.    Expected Outcomes Short: attend [facility] for exercise and education. Long: develope positive self care habits    Continue Psychosocial Services  Follow up required by staff           Psychosocial Re-Evaluation:  Psychosocial Re-Evaluation    [place] Name 01/18/20 1338 02/05/20 1308 03/14/20 1340 05/15/20 1406       Psychosocial Re-Evaluation   Current issues with Current Stress Concerns Current Stress Concerns;Current Depression Current Stress Concerns Current Stress Concerns    Comments [name] is doing well mentally and in a good place.  He has really enjoyed coming to class and feeling better overall.  He forgot to call court about his jury duty miss.  He just has a lot on his mind. [name] is doing well mentally.  He got a little scared last week after a bout of afib.  It got him not feeling well either.  It has increased his PHQ by a point, but feels good otherwise. Patient has to go get a procedure done on his heart. He is able to see family and go fishing and do things that he enjoys. He is getting an ablation done on Monday. [name] returned today.  Since he was gone.  His daughter and granddaughter were able to move out into a new house.  It's quieter at home now, but not in a bad way.  He is feeling good overall.    Expected Outcomes Short: Continue to attend rehab.  Long: Continue to stay positive. Short: Get heart back in rhythm.  Long: Continue to stay positive. Short: stay positive about his heart procedure. Long: maintain a positive outlook on mental health. Short: Enjoy new living arrangements Long: Continue to stay positive.    Interventions Encouraged to attend Cardiac Rehabilitation for the exercise Encouraged to attend Cardiac Rehabilitation for the exercise Encouraged to attend Cardiac Rehabilitation for the exercise Encouraged to attend Cardiac Rehabilitation for the exercise    Continue Psychosocial Services  Follow up required by staff  Follow up required by staff Follow up required by staff --           Psychosocial Discharge (Final Psychosocial Re-Evaluation):  Psychosocial Re-Evaluation - 05/15/20 1406      Psychosocial Re-Evaluation   Current issues with Current Stress Concerns    Comments [name] returned today.  Since he was gone.  His daughter and granddaughter were able to move out into a new house.  It's quieter at home now, but not in a bad way.  He is feeling good overall.    Expected Outcomes Short: Enjoy new living arrangements Long: Continue to stay positive.    Interventions Encouraged to attend Cardiac Rehabilitation for the exercise           Vocational Rehabilitation: Provide vocational rehab assistance to qualifying candidates.   Vocational Rehab Evaluation & Intervention:  Vocational Rehab - 01/04/20 1037      Initial Vocational Rehab Evaluation & Intervention   Assessment shows need for Vocational Rehabilitation No           Education: Education Goals: Education classes will be provided on a variety of topics geared toward better understanding of heart health and risk factor modification. Participant will state understanding/return  demonstration of topics presented as noted by education test scores.  Learning Barriers/Preferences:  Learning Barriers/Preferences - 01/04/20 1036      Learning Barriers/Preferences   Learning Barriers None    Learning Preferences None           General Cardiac Education Topics:  AED/CPR: - Group verbal and written instruction with the use of models to demonstrate the basic use of the AED with the basic ABC's of resuscitation.   Anatomy & Physiology of the Heart: - Group verbal and written instruction and models provide basic cardiac anatomy and physiology, with the coronary electrical and arterial systems. Review of Valvular disease and Heart Failure   Cardiac Procedures: - Group verbal and written instruction to review commonly prescribed  medications for heart disease. Reviews the medication, class of the drug, and side effects. Includes the steps to properly store meds and maintain the prescription regimen. (beta blockers and nitrates)   Cardiac Medications I: - Group verbal and written instruction to review commonly prescribed medications for heart disease. Reviews the medication, class of the drug, and side effects. Includes the steps to properly store meds and maintain the prescription regimen.   Cardiac Medications II: -Group verbal and written instruction to review commonly prescribed medications for heart disease. Reviews the medication, class of the drug, and side effects. (all other drug classes)    Go Sex-Intimacy & Heart Disease, Get SMART - Goal Setting: - Group verbal and written instruction through game format to discuss heart disease and the return to sexual intimacy. Provides group verbal and written material to discuss and apply goal setting through the application of the S.M.A.R.T. Method.   Other Matters of the Heart: - Provides group verbal, written materials and models to describe Stable Angina and Peripheral Artery. Includes description of the disease process and treatment options available to the cardiac patient.   Infection Prevention: - Provides verbal and written material to individual with discussion of infection control including proper hand washing and proper equipment cleaning during exercise session.   Cardiac Rehab from 01/08/2020 in [facility] Cardiac and Pulmonary Rehab  Date 01/08/20  Educator [facility]  Instruction Review Code 1- Verbalizes Understanding      Falls Prevention: - Provides verbal and written material to individual with discussion of falls prevention and safety.   Cardiac Rehab from 01/08/2020 in [facility] Cardiac and Pulmonary Rehab  Date 01/08/20  Educator [facility]  Instruction Review Code 1- Verbalizes Understanding      Other: -Provides group and verbal instruction on various topics  (see comments)   Knowledge Questionnaire Score:  Knowledge Questionnaire Score - 01/08/20 1259      Knowledge Questionnaire Score   Pre Score 20/26 Education Focus: Depression, Angina, Nutrition, Exercise           Core Components/Risk Factors/Patient Goals at Admission:  Personal Goals and Risk Factors at Admission - 01/08/20 1259      Core Components/Risk Factors/Patient Goals on Admission    Weight Management Yes;Obesity;Weight Loss    Intervention Weight Management: Develop a combined nutrition and exercise program designed to reach desired caloric intake, while maintaining appropriate intake of nutrient and fiber, sodium and fats, and appropriate energy expenditure required for the weight goal.;Weight Management: Provide education and appropriate resources to help participant work on and attain dietary goals.;Weight Management/Obesity: Establish reasonable short term and long term weight goals.;Obesity: Provide education and appropriate resources to help participant work on and attain dietary goals.    Admit Weight 241 lb 11.2 oz (109.6  kg)    Goal Weight: Short Term 235 lb (106.6 kg)    Goal Weight: Long Term 230 lb (104.3 kg)    Expected Outcomes Short Term: Continue to assess and modify interventions until short term weight is achieved;Long Term: Adherence to nutrition and physical activity/exercise program aimed toward attainment of established weight goal;Weight Loss: Understanding of general recommendations for a balanced deficit meal plan, which promotes 1-2 lb weight loss per week and includes a negative energy balance of [phone] kcal/d;Understanding recommendations for meals to include 15-35% energy as protein, 25-35% energy from fat, 35-60% energy from carbohydrates, less than 250m of dietary cholesterol, 20-35 gm of total fiber daily;Understanding of distribution of calorie intake throughout the day with the consumption of 4-5 meals/snacks    Diabetes Yes    Intervention  Provide education about signs/symptoms and action to take for hypo/hyperglycemia.;Provide education about proper nutrition, including hydration, and aerobic/resistive exercise prescription along with prescribed medications to achieve blood glucose in normal ranges: Fasting glucose 65-99 mg/dL    Expected Outcomes Short Term: Participant verbalizes understanding of the signs/symptoms and immediate care of hyper/hypoglycemia, proper foot care and importance of medication, aerobic/resistive exercise and nutrition plan for blood glucose control.;Long Term: Attainment of HbA1C < 7%.    Lipids Yes    Intervention Provide education and support for participant on nutrition & aerobic/resistive exercise along with prescribed medications to achieve LDL <725m HDL >4027m   Expected Outcomes Short Term: Participant states understanding of desired cholesterol values and is compliant with medications prescribed. Participant is following exercise prescription and nutrition guidelines.;Long Term: Cholesterol controlled with medications as prescribed, with individualized exercise RX and with personalized nutrition plan. Value goals: LDL < 70m6mDL > 40 mg.           Education:Diabetes - Individual verbal and written instruction to review signs/symptoms of diabetes, desired ranges of glucose level fasting, after meals and with exercise. Acknowledge that pre and post exercise glucose checks will be done for 3 sessions at entry of program.   [facility]  Date 01/04/20  [facility] 1- VerbUnited States Steel Corporationerstanding      Education: Know Your Numbers and Risk Factors: -Group verbal and written instruction about important numbers in your health.  Discussion of what are risk factors and how they play a role in the disease process.  Review of Cholesterol, Blood Pressure, Diabetes, and BMI and the role they play in your overall health.   Core  Components/Risk Factors/Patient Goals Review:   Goals and Risk Factor Review    Row Name 01/18/20 1340 02/05/20 1310 03/14/20 1342 05/15/20 1409       Core Components/Risk Factors/Patient Goals Review   Personal Goals Review Weight Management/Obesity;Hypertension Weight Management/Obesity;Hypertension Weight Management/Obesity;Hypertension;Diabetes Weight Management/Obesity;Hypertension;Diabetes;Lipids    Review [name] is doing well with his weight and coming down some each day.  Blood pressures are doing well and he checks them at home.  Sugars have also been good. [name] is doing well in rehab. Weight is down some.  Blood pressures are good.  On new med reginme to manage new onset afib. Smithy wants to lose more weight. He has lost around 20 pounds since the start of the program. His blood pressure has been stable and he has no questions about his medications. [name] returned today. He is down 30 lb since he started.  He has stayed away from bread.  His sugars are doing better as well.  His pressures continue to do well too.    Expected Outcomes Short: Continue to work on weight loss  Long: Continue to monitor risk factors Short: Continue to work on weight loss  Long: Continue to monitor risk factors Short: continue with weight loss. Long: lose 10 more pounds. Short: continue with weight loss. Long: lose 10 more pounds.           Core Components/Risk Factors/Patient Goals at Discharge (Final Review):   Goals and Risk Factor Review - 05/15/20 1409      Core Components/Risk Factors/Patient Goals Review   Personal Goals Review Weight Management/Obesity;Hypertension;Diabetes;Lipids    Review [name] returned today. He is down 30 lb since he started.  He has stayed away from bread.  His sugars are doing better as well.  His pressures continue to do well too.    Expected Outcomes Short: continue with weight loss. Long: lose 10 more pounds.           ITP Comments:  ITP Comments    Row Name  01/04/20 1048 01/08/20 1100 01/10/20 1323 01/31/20 1032 02/20/20 1314   ITP Comments Initial orientation phone call completed. Diagnosis can be found in [facility] 1/29. EP orientation scheduled for 3/1 at 9:30 Completed 6MWT and gym orientation.  Initial ITP created and sent for review to Dr. Emily Filbert, Medical Director. First full day of exercise!  Patient was oriented to gym and equipment including functions, settings, policies, and procedures.  Patient's individual exercise prescription and treatment plan were reviewed.  All starting workloads were established based on the results of the 6 minute walk test done at initial orientation visit.  The plan for exercise progression was also introduced and progression will be customized based on patient's performance and goals. 30 day chart review completed. ITP sent to Dr Zachery Dakins Medical Director, for review,changes as needed and signature. New to program Completed Initial RD Eval   [place] Name 02/28/20 0639 03/07/20 1424 03/14/20 1317 03/27/20 0740 04/01/20 1641   ITP Comments 30 Day review completed. Medical Director review done, changes made as directed,and approval shown by signature of Market researcher. Called to check on pt.  He has missed a couple of visits for doctor's appointments.  He has cataracts on both eyes and is scheduled for an ablation for 5/10.  He has also been feeling under the weather some this week.  He hopes to return next week. [name] is having an ablation on Monday 5/10 30 Day review completed. ITP review done, changes made as directed,and approval shown by signature of Scientist, research (life sciences). Pt has been out since 03/14/20 due to ablation procedure.  Waiting for clearance to return to program.   [place] Name 04/15/20 1535 04/24/20 0637 04/30/20 1415 05/08/20 0937 05/14/20 1357   ITP Comments Called to check on [name].  He has been using eye drops every two hours for his surgery for cataracts on Wed 6/9.  He will get clearance to return.  He  is also currently wearing a Zio patch for his afib. 30 Day review completed. Medical Director ITP review done, changes made as directed, and signed approval by Medical Director. [name] has been out due to cataract surgery - will get clearance to return Called to check on pt.  He was headed out the door to eye doctor now.  He is just waiting on clearance. Continues to wait for clearance to return.   [place] Name 05/15/20 1403 05/22/20 1101         ITP  Comments [name] returned to rehab today. He can see!! 30 Day review completed. Medical Director ITP review done, changes made as directed, and signed approval by Medical Director.             Comments:

## 2020-05-22 NOTE — Progress Notes (Signed)
Daily Session Note  Patient Details  Name: Tim Mccullough MRN: [id number] Date of Birth: 1951/08/10 Referring Provider:     Cardiac Rehab from 01/08/2020 in [facility] Cardiac and Pulmonary Rehab  Referring Provider [name] MD      Encounter Date: 05/22/2020  Check In:  Session Check In - 05/22/20 1351      Check-In   Supervising physician immediately available to respond to emergencies See telemetry face sheet for immediately available ER MD    Location [facility]-Cardiac & Pulmonary Rehab    Staff Present Renita Papa, RN BSN;Joseph Lou Miner, [place] Exercise Physiologist    Virtual Visit No    Medication changes reported     No    Fall or balance concerns reported    No    Warm-up and Cool-down Performed on first and last piece of equipment    Resistance Training Performed Yes    VAD Patient? No    PAD/SET Patient? No      Pain Assessment   Currently in Pain? No/denies              Social History   Tobacco Use  Smoking Status Former Smoker  . Years: 20.00  . Types: Cigarettes  . Quit date: 11/09/1998  . Years since quitting: 21.5  Smokeless Tobacco Former Systems developer  . Quit date: 06/03/2007  Tobacco Comment   no alcohol since nov 12    Goals Met:  Independence with exercise equipment Exercise tolerated well No report of cardiac concerns or symptoms Strength training completed today  Goals Unmet:  Not Applicable  Comments: Pt able to follow exercise prescription today without complaint.  Will continue to monitor for progression.    Dr. Emily Filbert is Medical Director for [place] and [facility].

## 2020-05-23 ENCOUNTER — Other Ambulatory Visit: Payer: Self-pay

## 2020-05-23 ENCOUNTER — Encounter: Payer: Medicare Other | Admitting: *Deleted

## 2020-05-23 DIAGNOSIS — Z87891 Personal history of nicotine dependence: Secondary | ICD-10-CM | POA: Diagnosis not present

## 2020-05-23 DIAGNOSIS — I110 Hypertensive heart disease with heart failure: Secondary | ICD-10-CM | POA: Diagnosis not present

## 2020-05-23 DIAGNOSIS — E119 Type 2 diabetes mellitus without complications: Secondary | ICD-10-CM | POA: Diagnosis not present

## 2020-05-23 DIAGNOSIS — I5022 Chronic systolic (congestive) heart failure: Secondary | ICD-10-CM

## 2020-05-23 NOTE — Progress Notes (Signed)
Daily Session Note  Patient Details  Name: Tim Mccullough MRN: [id number] Date of Birth: 25-Jun-1951 Referring Provider:     Cardiac Rehab from 01/08/2020 in [facility]  Referring Provider [name] MD      Encounter Date: 05/23/2020  Check In:  Session Check In - 05/23/20 1340      Check-In   Supervising physician immediately available to respond to emergencies See telemetry face sheet for immediately available ER MD    Location [facility]-Cardiac & Pulmonary Rehab    Staff Present Renita Papa, RN BSN;Joseph Hood RCP,RRT,BSRT;Laureen [place], [place], RRT, CPFT    Virtual Visit No    Medication changes reported     No    Fall or balance concerns reported    No    Warm-up and Cool-down Performed on first and last piece of equipment    Resistance Training Performed Yes    VAD Patient? No    PAD/SET Patient? No      Pain Assessment   Currently in Pain? No/denies              Social History   Tobacco Use  Smoking Status Former Smoker  . Years: 20.00  . Types: Cigarettes  . Quit date: 11/09/1998  . Years since quitting: 21.5  Smokeless Tobacco Former Systems developer  . Quit date: 06/03/2007  Tobacco Comment   no alcohol since nov 12    Goals Met:  Independence with exercise equipment Exercise tolerated well No report of cardiac concerns or symptoms Strength training completed today  Goals Unmet:  Not Applicable  Comments: Pt able to follow exercise prescription today without complaint.  Will continue to monitor for progression.    Dr. Emily Filbert is Medical Director for [facility].

## 2020-05-27 ENCOUNTER — Other Ambulatory Visit: Payer: Self-pay

## 2020-05-27 ENCOUNTER — Encounter: Payer: Medicare Other | Admitting: *Deleted

## 2020-05-27 DIAGNOSIS — Z87891 Personal history of nicotine dependence: Secondary | ICD-10-CM | POA: Diagnosis not present

## 2020-05-27 DIAGNOSIS — E119 Type 2 diabetes mellitus without complications: Secondary | ICD-10-CM | POA: Diagnosis not present

## 2020-05-27 DIAGNOSIS — I5022 Chronic systolic (congestive) heart failure: Secondary | ICD-10-CM

## 2020-05-27 DIAGNOSIS — I110 Hypertensive heart disease with heart failure: Secondary | ICD-10-CM | POA: Diagnosis not present

## 2020-05-27 NOTE — Progress Notes (Signed)
Daily Session Note  Patient Details  Name: MARKOS THEIL MRN: [id number] Date of Birth: May 02, 1951 Referring Provider:     Cardiac Rehab from 01/08/2020 in [facility] Cardiac and Pulmonary Rehab  Referring Provider [name] MD      Encounter Date: 05/27/2020  Check In:  Session Check In - 05/27/20 1333      Check-In   Supervising physician immediately available to respond to emergencies See telemetry face sheet for immediately available ER MD    Location [facility]-Cardiac & Pulmonary Rehab    Staff Present Renita Papa, RN Margurite Auerbach, MS Exercise Physiologist;Kelly Amedeo Plenty, BS, ACSM CEP, Exercise Physiologist    Virtual Visit No    Medication changes reported     No    Fall or balance concerns reported    No    Warm-up and Cool-down Performed on first and last piece of equipment    Resistance Training Performed Yes    VAD Patient? No    PAD/SET Patient? No      Pain Assessment   Currently in Pain? No/denies              Social History   Tobacco Use  Smoking Status Former Smoker   Years: 20.00   Types: Cigarettes   Quit date: 11/09/1998   Years since quitting: 21.5  Smokeless Tobacco Former Systems developer   Quit date: 06/03/2007  Tobacco Comment   no alcohol since nov 12    Goals Met:  Independence with exercise equipment Exercise tolerated well No report of cardiac concerns or symptoms Strength training completed today  Goals Unmet:  Not Applicable  Comments: Pt able to follow exercise prescription today without complaint.  Will continue to monitor for progression.  6 Minute Walk    Row Name 01/08/20 1101         6 Minute Walk   Phase Initial     Distance 1163 feet     Walk Time 6 minutes     # of Rest Breaks 0     MPH 2.2     METS 2.71     RPE 11     VO2 Peak 9.49     Symptoms Yes (comment)     Comments Left hip starting to be bothersome at end 6/10     Resting HR 91 bpm     Resting BP 126/70     Resting Oxygen Saturation  96 %     Exercise  Oxygen Saturation  during 6 min walk 93 %     Max Ex. HR 123 bpm     Max Ex. BP 142/70     2 Minute Post BP 130/60               Dr. Emily Filbert is Medical Director for [place] [facility].

## 2020-05-29 ENCOUNTER — Encounter: Payer: Medicare Other | Admitting: *Deleted

## 2020-05-29 ENCOUNTER — Other Ambulatory Visit: Payer: Self-pay

## 2020-05-29 DIAGNOSIS — I110 Hypertensive heart disease with heart failure: Secondary | ICD-10-CM | POA: Diagnosis not present

## 2020-05-29 DIAGNOSIS — E119 Type 2 diabetes mellitus without complications: Secondary | ICD-10-CM | POA: Diagnosis not present

## 2020-05-29 DIAGNOSIS — I5022 Chronic systolic (congestive) heart failure: Secondary | ICD-10-CM

## 2020-05-29 DIAGNOSIS — Z87891 Personal history of nicotine dependence: Secondary | ICD-10-CM | POA: Diagnosis not present

## 2020-05-29 NOTE — Progress Notes (Signed)
Daily Session Note  Patient Details  Name: Tim Mccullough MRN: [id number] Date of Birth: 03-Jun-1951 Referring Provider:     Cardiac Rehab from 01/08/2020 in [facility] Cardiac and Pulmonary Rehab  Referring Provider [name] MD      Encounter Date: 05/29/2020  Check In:  Session Check In - 05/29/20 1349      Check-In   Supervising physician immediately available to respond to emergencies See telemetry face sheet for immediately available ER MD    Location [facility]-Cardiac & Pulmonary Rehab    Staff Present Renita Papa, RN Margurite Auerbach, MS Exercise Physiologist;Amanda Oletta Darter, [place], ACSM CEP, Exercise Physiologist    Virtual Visit No    Medication changes reported     No    Fall or balance concerns reported    No    Warm-up and Cool-down Performed on first and last piece of equipment    Resistance Training Performed Yes    VAD Patient? No    PAD/SET Patient? No      Pain Assessment   Currently in Pain? No/denies              Social History   Tobacco Use  Smoking Status Former Smoker  . Years: 20.00  . Types: Cigarettes  . Quit date: 11/09/1998  . Years since quitting: 21.5  Smokeless Tobacco Former Systems developer  . Quit date: 06/03/2007  Tobacco Comment   no alcohol since nov 12    Goals Met:  Independence with exercise equipment Exercise tolerated well No report of cardiac concerns or symptoms Strength training completed today  Goals Unmet:  Not Applicable  Comments: Pt able to follow exercise prescription today without complaint.  Will continue to monitor for progression.    Dr. Emily Filbert is Medical Director for [place] [facility].

## 2020-05-30 ENCOUNTER — Encounter: Payer: Medicare Other | Admitting: *Deleted

## 2020-05-30 ENCOUNTER — Other Ambulatory Visit: Payer: Self-pay

## 2020-05-30 DIAGNOSIS — I5022 Chronic systolic (congestive) heart failure: Secondary | ICD-10-CM | POA: Diagnosis not present

## 2020-05-30 DIAGNOSIS — E119 Type 2 diabetes mellitus without complications: Secondary | ICD-10-CM | POA: Diagnosis not present

## 2020-05-30 DIAGNOSIS — I110 Hypertensive heart disease with heart failure: Secondary | ICD-10-CM | POA: Diagnosis not present

## 2020-05-30 DIAGNOSIS — Z87891 Personal history of nicotine dependence: Secondary | ICD-10-CM | POA: Diagnosis not present

## 2020-05-30 NOTE — Progress Notes (Signed)
Daily Session Note  Patient Details  Name: Tim Mccullough MRN: [id number] Date of Birth: [birth year] Referring Provider:     Cardiac Rehab from 01/08/2020 in [facility] Cardiac and Pulmonary Rehab  Referring Provider [name] MD      Encounter Date: 05/30/2020  Check In:  Session Check In - 05/30/20 1339      Check-In   Supervising physician immediately available to respond to emergencies See telemetry face sheet for immediately available ER MD    Location [facility]-Cardiac & Pulmonary Rehab    Staff Present Renita Papa, RN Margurite Auerbach, MS Exercise Physiologist;Amanda Oletta Darter, BA, ACSM CEP, Exercise Physiologist;Joseph Tessie Fass RCP,RRT,BSRT    Virtual Visit No    Medication changes reported     No    Fall or balance concerns reported    No    Warm-up and Cool-down Performed on first and last piece of equipment    Resistance Training Performed Yes    VAD Patient? No    PAD/SET Patient? No      Pain Assessment   Currently in Pain? No/denies              Social History   Tobacco Use  Smoking Status Former Smoker  . Years: 20.00  . Types: Cigarettes  . Quit date: 11/09/1998  . Years since quitting: 21.5  Smokeless Tobacco Former Systems developer  . Quit date: 06/03/2007  Tobacco Comment   no alcohol since nov 12    Goals Met:  Independence with exercise equipment Exercise tolerated well No report of cardiac concerns or symptoms Strength training completed today  Goals Unmet:  Not Applicable  Comments: Pt able to follow exercise prescription today without complaint.  Will continue to monitor for progression.    Dr. Emily Filbert is Medical Director for [place] and [facility].

## 2020-06-03 ENCOUNTER — Encounter: Payer: Medicare Other | Admitting: *Deleted

## 2020-06-03 ENCOUNTER — Other Ambulatory Visit: Payer: Self-pay

## 2020-06-03 DIAGNOSIS — I5022 Chronic systolic (congestive) heart failure: Secondary | ICD-10-CM

## 2020-06-03 DIAGNOSIS — E119 Type 2 diabetes mellitus without complications: Secondary | ICD-10-CM | POA: Diagnosis not present

## 2020-06-03 DIAGNOSIS — Z87891 Personal history of nicotine dependence: Secondary | ICD-10-CM | POA: Diagnosis not present

## 2020-06-03 DIAGNOSIS — I110 Hypertensive heart disease with heart failure: Secondary | ICD-10-CM | POA: Diagnosis not present

## 2020-06-03 NOTE — Progress Notes (Signed)
Cardiac Individual Treatment Plan  Patient Details  Name: Tim Mccullough  MRN: [id number] Date of Birth: 1951/02/15 Referring Provider:     Cardiac Rehab from 01/08/2020 in [facility] Cardiac and Pulmonary Rehab  Referring Provider [name] MD      Initial Encounter Date:    Cardiac Rehab from 01/08/2020 in [facility] Cardiac and Pulmonary Rehab  Date 01/08/20      Visit Diagnosis: Heart failure, chronic systolic ([place])  Patient's Home Medications on Admission:  Current Outpatient Medications:  .  amiodarone (PACERONE) 200 MG tablet, Take 1 tablet (200 mg total) by mouth daily., Disp: 30 tablet, Rfl: 6 .  aspirin 81 MG tablet, Take 81 mg by mouth daily. , Disp: , Rfl:  .  atorvastatin (LIPITOR) 40 MG tablet, TAKE 1 TABLET BY MOUTH EVERY DAY (Patient taking differently: Take 40 mg by mouth daily. TAKE 1 TABLET BY MOUTH EVERY DAY), Disp: 90 tablet, Rfl: 3 .  doxazosin (CARDURA) 1 MG tablet, 1 tablet daily (Patient taking differently: Take 1 mg by mouth daily. 1 tablet daily), Disp: 90 tablet, Rfl: 3 .  fish oil-omega-3 fatty acids 1000 MG capsule, Take 1 g by mouth 2 (two) times daily. , Disp: , Rfl:  .  gatifloxacin (ZYMAXID) 0.5 % SOLN, Place 1 drop into the left eye 4 (four) times daily., Disp: , Rfl:  .  losartan (COZAAR) 25 MG tablet, Take 1 tablet (25 mg) by mouth once daily at bedtime, Disp: , Rfl:  .  magnesium oxide (MAG-OX) 400 (241.3 Mg) MG tablet, Take 1 tablet (400 mg total) by mouth daily., Disp: 30 tablet, Rfl: 0 .  metFORMIN (GLUCOPHAGE) 500 MG tablet, TAKE 2 TABLETS (1,000 MG TOTAL) BY MOUTH 2 (TWO) TIMES DAILY WITH A MEAL., Disp: 360 tablet, Rfl: 3 .  metoprolol tartrate (LOPRESSOR) 25 MG tablet, TAKE 1.5 TABLETS (37.5 MG TOTAL) BY MOUTH 2 (TWO) TIMES DAILY., Disp: 270 tablet, Rfl: 3 .  omeprazole (PRILOSEC) 20 MG capsule, Take 1 capsule (20 mg total) by mouth daily., Disp: 30 capsule, Rfl: 11 .  ONE TOUCH ULTRA TEST test strip, CHECK BLOOD SUGAR DAILY AND IF BLOOD SUGAR  FLUCTUATES. 250.00, Disp: 100 each, Rfl: 11 .  OPCON-A 0.027-0.315 % SOLN, Apply 1 drop to eye every morning., Disp: , Rfl:  .  PARoxetine (PAXIL) 20 MG tablet, Take 1 tablet (20 mg total) by mouth daily., Disp: 90 tablet, Rfl: 3 .  spironolactone (ALDACTONE) 25 MG tablet, Take 0.5 tablet (12.5 mg) by mouth once daily at bedtime (Patient taking differently: Take 25 mg by mouth daily. ), Disp: , Rfl:  .  triamcinolone ointment (KENALOG) 0.1 %, Apply 1 application topically 2 (two) times daily as needed (irritation). , Disp: , Rfl:   Past Medical History: Past Medical History:  Diagnosis Date  . Abnormal echocardiogram 2/11   Repeated after med mgmt of cardiomyopathy showed EF 40-45%, mildly dilated LV, mild LV hypertrophy, anterolateral and apical hypokinesis, mild diastolic dysfunction.  . Anemia    hx  . Arthritis   . Blood transfusion without reported diagnosis 09/2012  . BPH (benign prostatic hypertrophy) 2004  . Cancer ([place])    melanoma back  . Cardiomyopathy    ? tachycardia-mediated  . Depression   . Diabetes mellitus 11/2006   Type II  . Diverticulosis   . Echocardiogram abnormal 7/10   Mild LVH with a mildly dilated LV.  EF was difficult to assess given poor acoustic windows but appeared moderately decreased.  Mild MR.  Probable small perimembranous VSD, mild LAE  . Esophageal erosions   . GERD (gastroesophageal reflux disease)   . Hip pain   . History of MRI 7/10   Cardiac MRI was done to followup difficult echo.  This showed mild to moderately dilated LV, mild to moderate LAE, EF  33% with global hypokinesis, normal RV size with mild systolic dysfunction.  There was no large VSD evident.  There was no myocardial delayed enhancement so no evidence for infiltrative disease or infarction.  LHC (7/10) showed only luminal irreg. in the coronaries and EF 35%  . Holter monitor, abnormal 7/10   Frequent PAC's and PVC's  Average HR 96  (but he had started Toprol XL).  HR range 71-130   . Hyperlipidemia 04/1998  . Hypertension 1995  . Obesity   . OSA (obstructive sleep apnea)    Not using CPAP- improved with weight loss  . Pneumonia    HX OF pna  . Sinus tachycardia    Pt says he has been told his heart rate is high since he was in high school.   . Sleep apnea    has cpap; but does not use  . VSD (ventricular septal defect)    Probable small perimembranous    Tobacco Use: Social History   Tobacco Use  Smoking Status Former Smoker  . Years: 20.00  . Types: Cigarettes  . Quit date: 11/09/1998  . Years since quitting: 21.5  Smokeless Tobacco Former Systems developer  . Quit date: 06/03/2007  Tobacco Comment   no alcohol since nov 12    Labs: Recent Review Flowsheet Data    Labs for ITP Cardiac and Pulmonary Rehab Latest Ref Rng & Units 01/20/2019 04/26/2019 08/21/2019 11/28/2019 01/29/2020   Cholestrol 0 - 200 mg/dL - 246(H) 142 - -   LDLCALC 0 - 99 mg/dL - 169(H) 76 - -   LDLDIRECT mg/dL - - - - -   HDL >39.00 mg/dL - 43.40 38.70(L) - -   Trlycerides 0 - 149 mg/dL - 172.0(H) 137.0 - -   Hemoglobin A1c 4.8 - 5.6 % 6.9(A) 7.0(H) 7.0(H) 6.8(H) 6.7(H)   PHART 7.35 - 7.45 - - - - -   PCO2ART 35 - 45 mmHg - - - - -   HCO3 20.0 - 24.0 mEq/L - - - - -   TCO2 0 - 100 mmol/L - - - - -   O2SAT % - - - - -       Exercise Target Goals: Exercise Program Goal: Individual exercise prescription set using results from initial 6 min walk test and THRR while considering  patient's activity barriers and safety.   Exercise Prescription Goal: Initial exercise prescription builds to 30-45 minutes a day of aerobic activity, 2-3 days per week.  Home exercise guidelines will be given to patient during program as part of exercise prescription that the participant will acknowledge.   Education: Aerobic Exercise & Resistance Training: - Gives group verbal and written instruction on the various components of exercise. Focuses on aerobic and resistive training programs and the benefits of this  training and how to safely progress through these programs..   Education: Exercise & Equipment Safety: - Individual verbal instruction and demonstration of equipment use and safety with use of the equipment.   Cardiac Rehab from 05/29/2020 in [facility] Cardiac and Pulmonary Rehab  Date 01/08/20  Educator [facility]  Instruction Review Code 1- Verbalizes Understanding      Education: Exercise Physiology & General Exercise Guidelines: -  Group verbal and written instruction with models to review the exercise physiology of the cardiovascular system and associated critical values. Provides general exercise guidelines with specific guidelines to those with heart or lung disease.    Cardiac Rehab from 05/29/2020 in [facility] Cardiac and Pulmonary Rehab  Date 05/22/20  Educator AS  Instruction Review Code 1- Verbalizes Understanding      Education: Flexibility, Balance, Mind/Body Relaxation: Provides group verbal/written instruction on the benefits of flexibility and balance training, including mind/body exercise modes such as yoga, pilates and tai chi.  Demonstration and skill practice provided.   Activity Barriers & Risk Stratification:  Activity Barriers & Cardiac Risk Stratification - 01/08/20 1102      Activity Barriers & Cardiac Risk Stratification   Activity Barriers Joint Problems;Back Problems;Left Hip Replacement;Right Knee Replacement;Deconditioning;Muscular Weakness    Cardiac Risk Stratification High           6 Minute Walk:  6 Minute Walk    Row Name 01/08/20 1101 05/27/20 1421       6 Minute Walk   Phase Initial Discharge    Distance 1163 feet 1455 feet    Distance % Change -- 25 %    Distance Feet Change -- 292 ft    Walk Time 6 minutes 6 minutes    # of Rest Breaks 0 0    MPH 2.2 2.75    METS 2.71 3.17    RPE 11 12    VO2 Peak 9.49 11.08    Symptoms Yes (comment) No    Comments Left hip starting to be bothersome at end 6/10 --    Resting HR 91 bpm 84 bpm    Resting BP  126/70 94/62    Resting Oxygen Saturation  96 % --    Exercise Oxygen Saturation  during 6 min walk 93 % --    Max Ex. HR 123 bpm 103 bpm    Max Ex. BP 142/70 130/64    2 Minute Post BP 130/60 --           Oxygen Initial Assessment:   Oxygen Re-Evaluation:   Oxygen Discharge (Final Oxygen Re-Evaluation):   Initial Exercise Prescription:  Initial Exercise Prescription - 01/08/20 1100      Date of Initial Exercise RX and Referring Provider   Date 01/08/20    Referring Provider [name] MD      Treadmill   MPH 2.2    Grade 0    Minutes 15    METs 2.68      Recumbant Elliptical   Level 1    RPM 50    Minutes 15    METs 2.7      Elliptical   Level 1    Speed 2.5    Minutes 15      REL-XR   Level 2    Speed 50    Minutes 15    METs 2.7      Prescription Details   Frequency (times per week) 3    Duration Progress to 30 minutes of continuous aerobic without signs/symptoms of physical distress      Intensity   THRR 40-80% of Max Heartrate 115-139    Ratings of Perceived Exertion 11-13    Perceived Dyspnea 0-4      Progression   Progression Continue to progress workloads to maintain intensity without signs/symptoms of physical distress.      Resistance Training   Training Prescription Yes    Weight 4 lb  Perform Capillary Blood Glucose checks as needed.  Exercise Prescription Changes:  Exercise Prescription Changes    Row Name 01/08/20 1100 01/24/20 1300 02/07/20 1500 02/22/20 1200 03/05/20 1400     Response to Exercise   Blood Pressure (Admit) 126/70 118/68 134/70 104/56 102/62   Blood Pressure (Exercise) 142/74 128/74 134/64 128/60 124/68   Blood Pressure (Exit) 130/60 124/70 124/64 118/60 100/62   Heart Rate (Admit) 97 bpm 94 bpm 101 bpm 90 bpm 99 bpm   Heart Rate (Exercise) 123 bpm 105 bpm 121 bpm 133 bpm 134 bpm   Heart Rate (Exit) 94 bpm 97 bpm 102 bpm 101 bpm 104 bpm   Oxygen Saturation (Admit) 96 % -- -- -- --   Oxygen  Saturation (Exercise) 93 % -- -- -- --   Rating of Perceived Exertion (Exercise) '11 13 13 13 14   ' Perceived Dyspnea (Exercise) 0 -- -- -- --   Symptoms hip bothersome at end 6/10 none none none none   Comments walk test results -- -- -- --   Duration -- Continue with 30 min of aerobic exercise without signs/symptoms of physical distress. Continue with 30 min of aerobic exercise without signs/symptoms of physical distress. Continue with 30 min of aerobic exercise without signs/symptoms of physical distress. Continue with 30 min of aerobic exercise without signs/symptoms of physical distress.   Intensity -- THRR unchanged THRR unchanged THRR unchanged THRR unchanged     Progression   Progression -- Continue to progress workloads to maintain intensity without signs/symptoms of physical distress. Continue to progress workloads to maintain intensity without signs/symptoms of physical distress. Continue to progress workloads to maintain intensity without signs/symptoms of physical distress. Continue to progress workloads to maintain intensity without signs/symptoms of physical distress.   Average METs -- 2.77 3.06 3.3 3.45     Resistance Training   Training Prescription -- Yes Yes Yes Yes   Weight -- 4 lb 4 lb 4 lb 4 lb   Reps -- 10-15 10-15 10-15 10-15     Interval Training   Interval Training -- No No No No     Treadmill   MPH -- 2.2 2.2 2.2 2.2   Grade -- 0 0 0 2   Minutes -- '15 15 15 15   ' METs -- 2.68 2.68 2.68 3.3     Recumbant Bike   Level -- 4 2.5 4 --   Minutes -- '15 15 15 ' --   METs -- 3.7 3.6 4.4 --     NuStep   Level -- '6 6 6 ' --   Minutes -- '15 15 15 ' --   METs -- 2.5 2.9 3.3 --     Recumbant Elliptical   Level -- 2 -- -- --   Minutes -- 15 -- -- --   METs -- 2.2 -- -- --     REL-XR   Level -- -- '1 6 4   ' Speed -- -- 3 -- --   Minutes -- -- '15 15 15   ' METs -- -- -- 2.8 3.6     Home Exercise Plan   Plans to continue exercise at -- Home (comment)  walking Home  (comment)  walking Home (comment)  walking Home (comment)  walking   Frequency -- Add 2 additional days to program exercise sessions. Add 2 additional days to program exercise sessions. Add 2 additional days to program exercise sessions. Add 2 additional days to program exercise sessions.   Initial Home Exercises Provided -- 01/12/20 01/12/20 01/12/20  01/12/20   Row Name 03/18/20 1000 04/15/20 1500 05/28/20 1300         Response to Exercise   Blood Pressure (Admit) 102/60 110/56 94/62     Blood Pressure (Exercise) 132/58 128/70 130/64     Blood Pressure (Exit) 122/64 112/58 90/60     Heart Rate (Admit) 95 bpm 78 bpm 76 bpm     Heart Rate (Exercise) 134 bpm 123 bpm 103 bpm     Heart Rate (Exit) 121 bpm 98 bpm 82 bpm     Rating of Perceived Exertion (Exercise) '13 13 12     ' Symptoms none none none     Duration Continue with 30 min of aerobic exercise without signs/symptoms of physical distress. Continue with 30 min of aerobic exercise without signs/symptoms of physical distress. Continue with 30 min of aerobic exercise without signs/symptoms of physical distress.     Intensity THRR unchanged THRR unchanged THRR unchanged       Progression   Progression Continue to progress workloads to maintain intensity without signs/symptoms of physical distress. Continue to progress workloads to maintain intensity without signs/symptoms of physical distress. Continue to progress workloads to maintain intensity without signs/symptoms of physical distress.     Average METs 3.61 3.55 3.2       Resistance Training   Training Prescription Yes Yes Yes     Weight 4 lb 4 lb 8 lb     Reps 10-15 10-15 10-15       Interval Training   Interval Training No No No       Treadmill   MPH 2.5 -- --     Grade 3.5 -- --     Minutes 15 -- --     METs 4.12 -- --       Recumbant Bike   Level 4 3 --     Minutes 15 15 --     METs 4.3 3.6 --       NuStep   Level 6 5 --     Minutes 15 15 --     METs 3 3.5 --        REL-XR   Level 6 -- 7     Speed -- -- 50     Minutes 15 -- 15     METs 3 -- 3.2       Home Exercise Plan   Plans to continue exercise at Home (comment)  walking Home (comment)  walking Home (comment)  walking     Frequency Add 2 additional days to program exercise sessions. Add 2 additional days to program exercise sessions. Add 2 additional days to program exercise sessions.     Initial Home Exercises Provided 01/12/20 01/12/20 01/12/20            Exercise Comments:  Exercise Comments    Row Name 01/10/20 1323           Exercise Comments First full day of exercise!  Patient was oriented to gym and equipment including functions, settings, policies, and procedures.  Patient's individual exercise prescription and treatment plan were reviewed.  All starting workloads were established based on the results of the 6 minute walk test done at initial orientation visit.  The plan for exercise progression was also introduced and progression will be customized based on patient's performance and goals.              Exercise Goals and Review:  Exercise Goals    Row Name 01/08/20 1255  Exercise Goals   Increase Physical Activity Yes       Intervention Provide advice, education, support and counseling about physical activity/exercise needs.;Develop an individualized exercise prescription for aerobic and resistive training based on initial evaluation findings, risk stratification, comorbidities and participant's personal goals.       Expected Outcomes Short Term: Attend rehab on a regular basis to increase amount of physical activity.;Long Term: Add in home exercise to make exercise part of routine and to increase amount of physical activity.;Long Term: Exercising regularly at least 3-5 days a week.       Increase Strength and Stamina Yes       Intervention Provide advice, education, support and counseling about physical activity/exercise needs.;Develop an individualized  exercise prescription for aerobic and resistive training based on initial evaluation findings, risk stratification, comorbidities and participant's personal goals.       Expected Outcomes Short Term: Increase workloads from initial exercise prescription for resistance, speed, and METs.;Short Term: Perform resistance training exercises routinely during rehab and add in resistance training at home;Long Term: Improve cardiorespiratory fitness, muscular endurance and strength as measured by increased METs and functional capacity (6MWT)       Able to understand and use rate of perceived exertion (RPE) scale Yes       Intervention Provide education and explanation on how to use RPE scale       Expected Outcomes Short Term: Able to use RPE daily in rehab to express subjective intensity level;Long Term:  Able to use RPE to guide intensity level when exercising independently       Able to understand and use Dyspnea scale Yes       Intervention Provide education and explanation on how to use Dyspnea scale       Expected Outcomes Short Term: Able to use Dyspnea scale daily in rehab to express subjective sense of shortness of breath during exertion;Long Term: Able to use Dyspnea scale to guide intensity level when exercising independently       Knowledge and understanding of Target Heart Rate Range (THRR) Yes       Intervention Provide education and explanation of THRR including how the numbers were predicted and where they are located for reference       Expected Outcomes Short Term: Able to use daily as guideline for intensity in rehab;Short Term: Able to state/look up THRR;Long Term: Able to use THRR to govern intensity when exercising independently       Able to check pulse independently Yes       Intervention Provide education and demonstration on how to check pulse in carotid and radial arteries.;Review the importance of being able to check your own pulse for safety during independent exercise       Expected  Outcomes Short Term: Able to explain why pulse checking is important during independent exercise;Long Term: Able to check pulse independently and accurately       Understanding of Exercise Prescription Yes       Intervention Provide education, explanation, and written materials on patient's individual exercise prescription       Expected Outcomes Short Term: Able to explain program exercise prescription;Long Term: Able to explain home exercise prescription to exercise independently              Exercise Goals Re-Evaluation :  Exercise Goals Re-Evaluation    Row Name 01/10/20 1323 01/18/20 1337 01/24/20 1326 02/05/20 1311 02/22/20 1212     Exercise Goal Re-Evaluation   Exercise Goals Review  Able to understand and use rate of perceived exertion (RPE) scale;Knowledge and understanding of Target Heart Rate Range (THRR);Understanding of Exercise Prescription Increase Physical Activity;Increase Strength and Stamina;Understanding of Exercise Prescription Increase Physical Activity;Increase Strength and Stamina;Understanding of Exercise Prescription Increase Physical Activity;Increase Strength and Stamina;Understanding of Exercise Prescription Increase Physical Activity;Increase Strength and Stamina;Understanding of Exercise Prescription   Comments Reviewed RPE scale, THR and program prescription with pt today.  Pt voiced understanding and was given a copy of goals to take home. [name] is off to a good start in rehab.  He is walking in the drive way now and plans to start gardening some more.  Reviewed home exercise with pt today.  Pt plans to walk at home for exercise.  Reviewed THR, pulse, RPE, sign and symptoms, NTG use, and when to call 911 or MD.  Also discussed weather considerations and indoor options.  Pt voiced understanding. [name] is doing well in rehab.  He is up to level 6 on the NuStep. We will continue to monitor his progress. [name] is doing well in rehab.  He usually is active at home and  gets to exercise on his off days.  Other than his recently heart problems, his stamina is getting better.  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 [name] is doing well in rehab.  He is now on level 6 on the XR!  We will continue to monitor his progress.   Expected Outcomes Short: Use RPE daily to regulate intensity. Long: Follow program prescription in THR. Short: Start to add in more walking  Long: Continue to build stamina. Short: Add incline on treadmill  Long: Continue to add in walking at home. Short: Continue to monitor HR  Long: Continue to walk on off days. Short: Try to add some incline on treadmill  Long: Continue to improve stamina.   [place] Name 03/05/20 1441 03/14/20 1330 03/18/20 1053 04/15/20 1535 05/14/20 1357     Exercise Goal Re-Evaluation   Exercise Goals Review Increase Physical Activity;Increase Strength and Stamina;Able to understand and use rate of perceived exertion (RPE) scale;Able to understand and use Dyspnea scale;Knowledge and understanding of Target Heart Rate Range (THRR);Able to check pulse independently;Understanding of Exercise Prescription Increase Physical Activity;Increase Strength and Stamina;Knowledge and understanding of Target Heart Rate Range (THRR) Increase Physical Activity;Increase Strength and Stamina;Understanding of Exercise Prescription Increase Physical Activity;Increase Strength and Stamina;Understanding of Exercise Prescription --   Comments [name] has progressed to 2.2 mph and 2 % incline on TM. He works in [employer] range. Staff will monitor progress. [place] is walking at home for exercise. Informed him that he is going to need to exercise when he is done with the program. He understands that he needs to keep his heart rate up for a time when he is walking. He is going to start checking his heart rate with his pulse oximeter at home. Verbalizes understanding of target heart rate. [name] is doing well in rehab.  He is up to level 6 on the  XR.  We will continue to montior his progress. [name] has been doing well in rehab.  He will be out this week due to cataract surgery.  He is up to 3.6 METs on the bike.  We will continue to monitor his progress upon return. Out since last review.   Expected Outcomes Short:  continue to increase workload Long : increase MET level Short: use pulse ox at home to monitor heart rate. Long: maintain vital signs at  home independently Short: Try to increase handweights  Long: Continue to improve stamina. Short: Clearance to return to rehab  Long: Continue to improve stamina --   Row Name 05/15/20 1403 05/28/20 1311 05/30/20 1345         Exercise Goal Re-Evaluation   Exercise Goals Review Increase Physical Activity;Increase Strength and Stamina;Understanding of Exercise Prescription Increase Physical Activity;Increase Strength and Stamina;Understanding of Exercise Prescription Knowledge and understanding of Target Heart Rate Range (THRR);Able to check pulse independently;Understanding of Exercise Prescription;Increase Physical Activity;Able to understand and use rate of perceived exertion (RPE) scale     Comments [name] returned today!  He has been recovering from surgery for his eyes.  He is eager to finish up and get back to it.  He was able to get back to his workloads and even increased his bike some. [name] completed his posst 6MWT and improved by 292 feet or 25%!  He will complete HT in 4 more sessions. [name] wants to walk on trails and if it is raining go to the mall to walk. He has a bike at home but would like to get a different machine to exercise with.     Expected Outcomes Short: Continue to attend regularly Long: Continue to improve stamina Short: complete HT program Long: maintain exercise on his own Short: continue to exercise post [facility]. Long: maintain exercise independently at home.            Discharge Exercise Prescription (Final Exercise Prescription Changes):  Exercise Prescription  Changes - 05/28/20 1300      Response to Exercise   Blood Pressure (Admit) 94/62    Blood Pressure (Exercise) 130/64    Blood Pressure (Exit) 90/60    Heart Rate (Admit) 76 bpm    Heart Rate (Exercise) 103 bpm    Heart Rate (Exit) 82 bpm    Rating of Perceived Exertion (Exercise) 12    Symptoms none    Duration Continue with 30 min of aerobic exercise without signs/symptoms of physical distress.    Intensity THRR unchanged      Progression   Progression Continue to progress workloads to maintain intensity without signs/symptoms of physical distress.    Average METs 3.2      Resistance Training   Training Prescription Yes    Weight 8 lb    Reps 10-15      Interval Training   Interval Training No      REL-XR   Level 7    Speed 50    Minutes 15    METs 3.2      Home Exercise Plan   Plans to continue exercise at Home (comment)   walking   Frequency Add 2 additional days to program exercise sessions.    Initial Home Exercises Provided 01/12/20           Nutrition:  Target Goals: Understanding of nutrition guidelines, daily intake of sodium <1544m, cholesterol <2054m calories 30% from fat and 7% or less from saturated fats, daily to have 5 or more servings of fruits and vegetables.  Education: Controlling Sodium/Reading Food Labels -Group verbal and written material supporting the discussion of sodium use in heart healthy nutrition. Review and explanation with models, verbal and written materials for utilization of the food label.   Education: General Nutrition Guidelines/Fats and Fiber: -Group instruction provided by verbal, written material, models and posters to present the general guidelines for heart healthy nutrition. Gives an explanation and review of dietary fats and fiber.   Biometrics:  Pre Biometrics - 01/08/20 1257      Pre Biometrics   Height 5' 10.4" (1.788 m)    Weight 241 lb 11.2 oz (109.6 kg)    BMI (Calculated) 34.29    Single Leg Stand 26.06  seconds            Nutrition Therapy Plan and Nutrition Goals:  Nutrition Therapy & Goals - 02/20/20 1300      Nutrition Therapy   Diet HH, Low Na, DM    Drug/Food Interactions Statins/Certain Fruits    Protein (specify units) 85-90g    Fiber 30 grams    Whole Grain Foods 3 servings    Saturated Fats 12 max. grams    Fruits and Vegetables 5 servings/day    Sodium 1.5 grams      Personal Nutrition Goals   Nutrition Goal ST: continue with current changes LT: lose 30lbs (10 lbs so so far), Getting back into shape    Comments B: couple boiled eggs with fruit and protein shake L or S: chips with salsa, broccoli with ranch dressing, or carrot chips D: chicken with cauliflower rice. BG: ~150 randomly, A1c ~6.7. Discussed HH and DM friendly eating, CHOs, honoring hunger, tips for more satiating and satisfying meals.      Intervention Plan   Intervention Prescribe, educate and counsel regarding individualized specific dietary modifications aiming towards targeted core components such as weight, hypertension, lipid management, diabetes, heart failure and other comorbidities.;Nutrition handout(s) given to patient.    Expected Outcomes Short Term Goal: Understand basic principles of dietary content, such as calories, fat, sodium, cholesterol and nutrients.;Short Term Goal: A plan has been developed with personal nutrition goals set during dietitian appointment.;Long Term Goal: Adherence to prescribed nutrition plan.           Nutrition Assessments:  Nutrition Assessments - 01/08/20 1259      MEDFICTS Scores   Pre Score 72           MEDIFICTS Score Key:          ?70 Need to make dietary changes          40-70 Heart Healthy Diet         ? 40 Therapeutic Level Cholesterol Diet  Nutrition Goals Re-Evaluation:  Nutrition Goals Re-Evaluation    [place] Name 05/15/20 1410 05/27/20 1457           Goals   Nutrition Goal ST: continue with current changes LT: lose 30lbs (10 lbs so so  far), Getting back into shape ST: continue with current changes LT: lose 30lbs (10 lbs so so far), Getting back into shape      Comment [name] has been doing well with his diet and continues to lose weight. So he plans to continue with changes Pt has been doing well with his diet and continues to lose weight. Pt reports not needing any further help from RD at this time; continue with changes      Expected Outcome Short: Continue with current changes  Long: Continue to work on weight loss Short: Continue with current changes  Long: Continue to work on weight loss             Nutrition Goals Discharge (Final Nutrition Goals Re-Evaluation):  Nutrition Goals Re-Evaluation - 05/27/20 1457      Goals   Nutrition Goal ST: continue with current changes LT: lose 30lbs (10 lbs so so far), Getting back into shape    Comment Pt has been doing well with  his diet and continues to lose weight. Pt reports not needing any further help from RD at this time; continue with changes    Expected Outcome Short: Continue with current changes  Long: Continue to work on weight loss           Psychosocial: Target Goals: Acknowledge presence or absence of significant depression and/or stress, maximize coping skills, provide positive support system. Participant is able to verbalize types and ability to use techniques and skills needed for reducing stress and depression.   Education: Depression - Provides group verbal and written instruction on the correlation between heart/lung disease and depressed mood, treatment options, and the stigmas associated with seeking treatment.   Education: Sleep Hygiene -Provides group verbal and written instruction about how sleep can affect your health.  Define sleep hygiene, discuss sleep cycles and impact of sleep habits. Review good sleep hygiene tips.     Education: Stress and Anxiety: - Provides group verbal and written instruction about the health risks of elevated stress  and causes of high stress.  Discuss the correlation between heart/lung disease and anxiety and treatment options. Review healthy ways to manage with stress and anxiety.    Initial Review & Psychosocial Screening:  Initial Psych Review & Screening - 01/04/20 1040      Initial Review   Current issues with Current Stress Concerns    Source of Stress Concerns Chronic Illness      Family Dynamics   Good Support System? Yes   daughter, grandaughters     Barriers   Psychosocial barriers to participate in program There are no identifiable barriers or psychosocial needs.;The patient should benefit from training in stress management and relaxation.      Screening Interventions   Interventions Provide feedback about the scores to participant;Encouraged to exercise;To provide support and resources with identified psychosocial needs    Expected Outcomes Short Term goal: Utilizing psychosocial counselor, staff and physician to assist with identification of specific Stressors or current issues interfering with healing process. Setting desired goal for each stressor or current issue identified.;Long Term Goal: Stressors or current issues are controlled or eliminated.;Short Term goal: Identification and review with participant of any Quality of Life or Depression concerns found by scoring the questionnaire.;Long Term goal: The participant improves quality of Life and PHQ9 Scores as seen by post scores and/or verbalization of changes           Quality of Life Scores:   Quality of Life - 01/08/20 1258      Quality of Life   Select Quality of Life      Quality of Life Scores   Health/Function Pre 17.03 %    Socioeconomic Pre 20.79 %    Psych/Spiritual Pre 20.71 %    Family Pre 27 %    GLOBAL Pre 19.82 %          Scores of 19 and below usually indicate a poorer quality of life in these areas.  A difference of  2-3 points is a clinically meaningful difference.  A difference of 2-3 points in the  total score of the Quality of Life Index has been associated with significant improvement in overall quality of life, self-image, physical symptoms, and general health in studies assessing change in quality of life.  PHQ-9: Recent Review Flowsheet Data    Depression screen [facility] 2/9 03/14/2020 02/05/2020 01/08/2020 04/26/2019 04/08/2018   Decreased Interest '2 2  1 ' 0 0   Down, Depressed, Hopeless 0 0 0 0 0  PHQ - 2 Score '2 2 1 ' 0 0   Altered sleeping 0 1 1 0 0   Tired, decreased energy '3 3 2 ' 0 0   Change in appetite 0 0 1 0 0   Feeling bad or failure about yourself  0 0 0 0 0   Trouble concentrating 0 0 0 0 0   Moving slowly or fidgety/restless 0 0 0 0 0   Suicidal thoughts 0 0 0 0 0   PHQ-9 Score '5 6 5 ' 0 0   Difficult doing work/chores Not difficult at all Somewhat difficult Somewhat difficult Not difficult at all Not difficult at all     Interpretation of Total Score  Total Score Depression Severity:  1-4 = Minimal depression, 5-9 = Mild depression, 10-14 = Moderate depression, 15-19 = Moderately severe depression, 20-27 = Severe depression   Psychosocial Evaluation and Intervention:  Psychosocial Evaluation - 05/30/20 1350      Discharge Psychosocial Assessment & Intervention   Comments He mostly has a positive attitude about his health and wants to keep getting out and doing activities.           Psychosocial Re-Evaluation:  Psychosocial Re-Evaluation    [place] Name 01/18/20 1338 02/05/20 1308 03/14/20 1340 05/15/20 1406       Psychosocial Re-Evaluation   Current issues with Current Stress Concerns Current Stress Concerns;Current Depression Current Stress Concerns Current Stress Concerns    Comments [name] is doing well mentally and in a good place.  He has really enjoyed coming to class and feeling better overall.  He forgot to call court about his jury duty miss.  He just has a lot on his mind. [name] is doing well mentally.  He got a little scared last week after a bout of afib.  It  got him not feeling well either.  It has increased his PHQ by a point, but feels good otherwise. Patient has to go get a procedure done on his heart. He is able to see family and go fishing and do things that he enjoys. He is getting an ablation done on Monday. [name] returned today.  Since he was gone.  His daughter and granddaughter were able to move out into a new house.  It's quieter at home now, but not in a bad way.  He is feeling good overall.    Expected Outcomes Short: Continue to attend rehab.  Long: Continue to stay positive. Short: Get heart back in rhythm.  Long: Continue to stay positive. Short: stay positive about his heart procedure. Long: maintain a positive outlook on mental health. Short: Enjoy new living arrangements Long: Continue to stay positive.    Interventions Encouraged to attend Cardiac Rehabilitation for the exercise Encouraged to attend Cardiac Rehabilitation for the exercise Encouraged to attend Cardiac Rehabilitation for the exercise Encouraged to attend Cardiac Rehabilitation for the exercise    Continue Psychosocial Services  Follow up required by staff Follow up required by staff Follow up required by staff --           Psychosocial Discharge (Final Psychosocial Re-Evaluation):  Psychosocial Re-Evaluation - 05/15/20 1406      Psychosocial Re-Evaluation   Current issues with Current Stress Concerns    Comments [name] returned today.  Since he was gone.  His daughter and granddaughter were able to move out into a new house.  It's quieter at home now, but not in a bad way.  He is feeling good overall.    Expected Outcomes  Short: Enjoy new living arrangements Long: Continue to stay positive.    Interventions Encouraged to attend Cardiac Rehabilitation for the exercise           Vocational Rehabilitation: Provide vocational rehab assistance to qualifying candidates.   Vocational Rehab Evaluation & Intervention:  Vocational Rehab - 01/04/20 1037      Initial  Vocational Rehab Evaluation & Intervention   Assessment shows need for Vocational Rehabilitation No           Education: Education Goals: Education classes will be provided on a variety of topics geared toward better understanding of heart health and risk factor modification. Participant will state understanding/return demonstration of topics presented as noted by education test scores.  Learning Barriers/Preferences:  Learning Barriers/Preferences - 01/04/20 1036      Learning Barriers/Preferences   Learning Barriers None    Learning Preferences None           General Cardiac Education Topics:  AED/CPR: - Group verbal and written instruction with the use of models to demonstrate the basic use of the AED with the basic ABC's of resuscitation.   Anatomy & Physiology of the Heart: - Group verbal and written instruction and models provide basic cardiac anatomy and physiology, with the coronary electrical and arterial systems. Review of Valvular disease and Heart Failure   Cardiac Procedures: - Group verbal and written instruction to review commonly prescribed medications for heart disease. Reviews the medication, class of the drug, and side effects. Includes the steps to properly store meds and maintain the prescription regimen. (beta blockers and nitrates)   Cardiac Medications I: - Group verbal and written instruction to review commonly prescribed medications for heart disease. Reviews the medication, class of the drug, and side effects. Includes the steps to properly store meds and maintain the prescription regimen.   Cardiac Medications II: -Group verbal and written instruction to review commonly prescribed medications for heart disease. Reviews the medication, class of the drug, and side effects. (all other drug classes)    Go Sex-Intimacy & Heart Disease, Get SMART - Goal Setting: - Group verbal and written instruction through game format to discuss heart disease and  the return to sexual intimacy. Provides group verbal and written material to discuss and apply goal setting through the application of the S.M.A.R.T. Method.   Other Matters of the Heart: - Provides group verbal, written materials and models to describe Stable Angina and Peripheral Artery. Includes description of the disease process and treatment options available to the cardiac patient.   Infection Prevention: - Provides verbal and written material to individual with discussion of infection control including proper hand washing and proper equipment cleaning during exercise session.   Cardiac Rehab from 05/29/2020 in [facility] Cardiac and Pulmonary Rehab  Date 01/08/20  Educator [facility]  Instruction Review Code 1- Verbalizes Understanding      Falls Prevention: - Provides verbal and written material to individual with discussion of falls prevention and safety.   Cardiac Rehab from 05/29/2020 in [facility] Cardiac and Pulmonary Rehab  Date 01/08/20  Educator [facility]  Instruction Review Code 1- Verbalizes Understanding      Other: -Provides group and verbal instruction on various topics (see comments)   Knowledge Questionnaire Score:  Knowledge Questionnaire Score - 01/08/20 1259      Knowledge Questionnaire Score   Pre Score 20/26 Education Focus: Depression, Angina, Nutrition, Exercise           Core Components/Risk Factors/Patient Goals at Admission:  Personal Goals and Risk  Factors at Admission - 01/08/20 1259      Core Components/Risk Factors/Patient Goals on Admission    Weight Management Yes;Obesity;Weight Loss    Intervention Weight Management: Develop a combined nutrition and exercise program designed to reach desired caloric intake, while maintaining appropriate intake of nutrient and fiber, sodium and fats, and appropriate energy expenditure required for the weight goal.;Weight Management: Provide education and appropriate resources to help participant work on and attain dietary  goals.;Weight Management/Obesity: Establish reasonable short term and long term weight goals.;Obesity: Provide education and appropriate resources to help participant work on and attain dietary goals.    Admit Weight 241 lb 11.2 oz (109.6 kg)    Goal Weight: Short Term 235 lb (106.6 kg)    Goal Weight: Long Term 230 lb (104.3 kg)    Expected Outcomes Short Term: Continue to assess and modify interventions until short term weight is achieved;Long Term: Adherence to nutrition and physical activity/exercise program aimed toward attainment of established weight goal;Weight Loss: Understanding of general recommendations for a balanced deficit meal plan, which promotes 1-2 lb weight loss per week and includes a negative energy balance of [id number] kcal/d;Understanding recommendations for meals to include 15-35% energy as protein, 25-35% energy from fat, 35-60% energy from carbohydrates, less than 260m of dietary cholesterol, 20-35 gm of total fiber daily;Understanding of distribution of calorie intake throughout the day with the consumption of 4-5 meals/snacks    Diabetes Yes    Intervention Provide education about signs/symptoms and action to take for hypo/hyperglycemia.;Provide education about proper nutrition, including hydration, and aerobic/resistive exercise prescription along with prescribed medications to achieve blood glucose in normal ranges: Fasting glucose 65-99 mg/dL    Expected Outcomes Short Term: Participant verbalizes understanding of the signs/symptoms and immediate care of hyper/hypoglycemia, proper foot care and importance of medication, aerobic/resistive exercise and nutrition plan for blood glucose control.;Long Term: Attainment of HbA1C < 7%.    Lipids Yes    Intervention Provide education and support for participant on nutrition & aerobic/resistive exercise along with prescribed medications to achieve LDL <777m HDL >4028m   Expected Outcomes Short Term: Participant states understanding  of desired cholesterol values and is compliant with medications prescribed. Participant is following exercise prescription and nutrition guidelines.;Long Term: Cholesterol controlled with medications as prescribed, with individualized exercise RX and with personalized nutrition plan. Value goals: LDL < 42m35mDL > 40 mg.           Education:Diabetes - Individual verbal and written instruction to review signs/symptoms of diabetes, desired ranges of glucose level fasting, after meals and with exercise. Acknowledge that pre and post exercise glucose checks will be done for 3 sessions at entry of program.   [facility]  Date 01/04/20  Educ[facility] Code 1- VerbUnited States Steel Corporationerstanding      Education: Know Your Numbers and Risk Factors: -Group verbal and written instruction about important numbers in your health.  Discussion of what are risk factors and how they play a role in the disease process.  Review of Cholesterol, Blood Pressure, Diabetes, and BMI and the role they play in your overall health.   Core Components/Risk Factors/Patient Goals Review:   Goals and Risk Factor Review    Row Name 01/18/20 1340 02/05/20 1310 03/14/20 1342 05/15/20 1409 05/30/20 1349     Core Components/Risk Factors/Patient Goals Review   Personal Goals Review Weight Management/Obesity;Hypertension Weight Management/Obesity;Hypertension Weight Management/Obesity;Hypertension;Diabetes Weight Management/Obesity;Hypertension;Diabetes;Lipids Weight Management/Obesity   Review [name]  is doing well with his weight and coming down some each day.  Blood pressures are doing well and he checks them at home.  Sugars have also been good. [name] is doing well in rehab. Weight is down some.  Blood pressures are good.  On new med reginme to manage new onset afib. Smithy wants to lose more weight. He has lost around 20 pounds since the start of the program. His blood  pressure has been stable and he has no questions about his medications. [name] returned today. He is down 30 lb since he started.  He has stayed away from bread.  His sugars are doing better as well.  His pressures continue to do well too. 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 He is going to continue to exercise post Rehab and get below 200 pounds.   Expected Outcomes Short: Continue to work on weight loss  Long: Continue to monitor risk factors Short: Continue to work on weight loss  Long: Continue to monitor risk factors Short: continue with weight loss. Long: lose 10 more pounds. Short: continue with weight loss. Long: lose 10 more pounds. Short: Engineer, manufacturing. Long: Get below 200 pounds.          Core Components/Risk Factors/Patient Goals at Discharge (Final Review):   Goals and Risk Factor Review - 05/30/20 1349      Core Components/Risk Factors/Patient Goals Review   Personal Goals Review Weight Management/Obesity    Review [name] is graduationg [employer] and has lost about 30 pounds in the program. He is going to continue to exercise post Rehab and get below 200 pounds.    Expected Outcomes Short: Graduate [employer]. Long: Get below 200 pounds.           ITP Comments:  ITP Comments    Row Name 01/04/20 1048 01/08/20 1100 01/10/20 1323 01/31/20 1032 02/20/20 1314   ITP Comments Initial orientation phone call completed. Diagnosis can be found in [facility] 1/29. EP orientation scheduled for 3/1 at 9:30 Completed 6MWT and gym orientation.  Initial ITP created and sent for review to Dr. Emily Filbert, Medical Director. First full day of exercise!  Patient was oriented to gym and equipment including functions, settings, policies, and procedures.  Patient's individual exercise prescription and treatment plan were reviewed.  All starting workloads were established based on the results of the 6 minute walk test done at initial orientation visit.  The plan  for exercise progression was also introduced and progression will be customized based on patient's performance and goals. 30 day chart review completed. ITP sent to Dr Zachery Dakins Medical Director, for review,changes as needed and signature. New to program Completed Initial RD Eval   Row Name 02/28/20 0639 03/07/20 1424 03/14/20 1317 03/27/20 0740 04/01/20 1641   ITP Comments 30 Day review completed. Medical Director review done, changes made as directed,and approval shown by signature of Market researcher. Called to check on pt.  He has missed a couple of visits for doctor's appointments.  He has cataracts on both eyes and is scheduled for an ablation for 5/10.  He has also been feeling under the weather some this week.  He hopes to return next week. [name] is having an ablation on Monday 5/10 30 Day review completed. ITP review done, changes made as directed,and approval shown by signature of Scientist, research (life sciences). Pt has been out since 03/14/20 due to ablation procedure.  Waiting for clearance to return to program.  [place] Name 04/15/20 1535 04/24/20 0637 04/30/20 1415 05/08/20 0937 05/14/20 1357   ITP Comments Called to check on [name].  He has been using eye drops every two hours for his surgery for cataracts on Wed 6/9.  He will get clearance to return.  He is also currently wearing a Zio patch for his afib. 30 Day review completed. Medical Director ITP review done, changes made as directed, and signed approval by Medical Director. [name] has been out due to cataract surgery - will get clearance to return Called to check on pt.  He was headed out the door to eye doctor now.  He is just waiting on clearance. Continues to wait for clearance to return.   [place] Name 05/15/20 1403 05/22/20 1101 06/03/20 1431       ITP Comments [name] returned to rehab today. He can see!! 30 Day review completed. Medical Director ITP review done, changes made as directed, and signed approval by Medical Director. Gl[place]ell  graduated today from  rehab with 35 sessions completed.  Details of the patient's exercise prescription and what He needs to do in order to continue the prescription and progress were discussed with patient.  Patient was given a copy of prescription and goals.  Patient verbalized understanding.  Taeden plans to continue to exercise by walking at home.            Comments: Discharge ITP

## 2020-06-03 NOTE — Progress Notes (Signed)
Daily Session Note  Patient Details  Name: Tim Mccullough MRN: [id number] Date of Birth: [birth year] Referring Provider:     Cardiac Rehab from 01/08/2020 in [facility] Cardiac [facility]  Referring Provider [name] MD      Encounter Date: 06/03/2020  Check In:  Session Check In - 06/03/20 1328      Check-In   Supervising physician immediately available to respond to emergencies See telemetry face sheet for immediately available ER MD    Location [facility]-Cardiac & Pulmonary Rehab    Staff Present Renita Papa, RN Moises Blood, BS, ACSM CEP, Exercise Physiologist;Joseph Tessie Fass RCP,RRT,BSRT    Virtual Visit No    Medication changes reported     No    Fall or balance concerns reported    No    Warm-up and Cool-down Performed on first and last piece of equipment    Resistance Training Performed Yes    VAD Patient? No    PAD/SET Patient? No      Pain Assessment   Currently in Pain? No/denies              Social History   Tobacco Use  Smoking Status Former Smoker  . Years: 20.00  . Types: Cigarettes  . Quit date: 11/09/1998  . Years since quitting: 21.5  Smokeless Tobacco Former Systems developer  . Quit date: 06/03/2007  Tobacco Comment   no alcohol since nov 12    Goals Met:  Independence with exercise equipment Exercise tolerated well No report of cardiac concerns or symptoms Strength training completed today  Goals Unmet:  Not Applicable  Comments: Tim Mccullough graduated today from  rehab with 35 sessions completed.  Details of the patient's exercise prescription and what He needs to do in order to continue the prescription and progress were discussed with patient.  Patient was given a copy of prescription and goals.  Patient verbalized understanding.  Tim Mccullough plans to continue to exercise by walking at home.     Dr. Emily Filbert is Medical Director for [facility].

## 2020-06-03 NOTE — Patient Instructions (Signed)
Discharge Patient Instructions  Patient Details  Name: Tim Mccullough MRN: [id number] Date of Birth: November 23, 1950 Referring Provider:  Wellington Hampshire, MD   Number of Visits: 71  Reason for Discharge:  Patient reached a stable level of exercise. Patient independent in their exercise. Patient has met program and personal goals.  Smoking History:  Social History   Tobacco Use  Smoking Status Former Smoker  . Years: 20.00  . Types: Cigarettes  . Quit date: 11/09/1998  . Years since quitting: 21.5  Smokeless Tobacco Former Systems developer  . Quit date: 06/03/2007  Tobacco Comment   no alcohol since nov 12    Diagnosis:  Heart failure, chronic systolic (HCC)  Initial Exercise Prescription:  Initial Exercise Prescription - 01/08/20 1100      Date of Initial Exercise RX and Referring Provider   Date 01/08/20    Referring Provider [name] MD      Treadmill   MPH 2.2    Grade 0    Minutes 15    METs 2.68      Recumbant Elliptical   Level 1    RPM 50    Minutes 15    METs 2.7      Elliptical   Level 1    Speed 2.5    Minutes 15      REL-XR   Level 2    Speed 50    Minutes 15    METs 2.7      Prescription Details   Frequency (times per week) 3    Duration Progress to 30 minutes of continuous aerobic without signs/symptoms of physical distress      Intensity   THRR 40-80% of Max Heartrate 115-139    Ratings of Perceived Exertion 11-13    Perceived Dyspnea 0-4      Progression   Progression Continue to progress workloads to maintain intensity without signs/symptoms of physical distress.      Resistance Training   Training Prescription Yes    Weight 4 lb           Discharge Exercise Prescription (Final Exercise Prescription Changes):  Exercise Prescription Changes - 05/28/20 1300      Response to Exercise   Blood Pressure (Admit) 94/62    Blood Pressure (Exercise) 130/64    Blood Pressure (Exit) 90/60    Heart Rate (Admit) 76 bpm    Heart Rate  (Exercise) 103 bpm    Heart Rate (Exit) 82 bpm    Rating of Perceived Exertion (Exercise) 12    Symptoms none    Duration Continue with 30 min of aerobic exercise without signs/symptoms of physical distress.    Intensity THRR unchanged      Progression   Progression Continue to progress workloads to maintain intensity without signs/symptoms of physical distress.    Average METs 3.2      Resistance Training   Training Prescription Yes    Weight 8 lb    Reps 10-15      Interval Training   Interval Training No      REL-XR   Level 7    Speed 50    Minutes 15    METs 3.2      Home Exercise Plan   Plans to continue exercise at Home (comment)   walking   Frequency Add 2 additional days to program exercise sessions.    Initial Home Exercises Provided 01/12/20           Functional Capacity:  6  Minute Walk    Row Name 01/08/20 1101 05/27/20 1421       6 Minute Walk   Phase Initial Discharge    Distance 1163 feet 1455 feet    Distance % Change -- 25 %    Distance Feet Change -- 292 ft    Walk Time 6 minutes 6 minutes    # of Rest Breaks 0 0    MPH 2.2 2.75    METS 2.71 3.17    RPE 11 12    VO2 Peak 9.49 11.08    Symptoms Yes (comment) No    Comments Left hip starting to be bothersome at end 6/10 --    Resting HR 91 bpm 84 bpm    Resting BP 126/70 94/62    Resting Oxygen Saturation  96 % --    Exercise Oxygen Saturation  during 6 min walk 93 % --    Max Ex. HR 123 bpm 103 bpm    Max Ex. BP 142/70 130/64    2 Minute Post BP 130/60 --             Nutrition & Weight - Outcomes:  Pre Biometrics - 01/08/20 1257      Pre Biometrics   Height 5' 10.4" (1.788 m)    Weight 241 lb 11.2 oz (109.6 kg)    BMI (Calculated) 34.29    Single Leg Stand 26.06 seconds            Nutrition:  Nutrition Therapy & Goals - 02/20/20 1300      Nutrition Therapy   Diet HH, Low Na, DM    Drug/Food Interactions Statins/Certain Fruits    Protein (specify units) 85-90g     Fiber 30 grams    Whole Grain Foods 3 servings    Saturated Fats 12 max. grams    Fruits and Vegetables 5 servings/day    Sodium 1.5 grams      Personal Nutrition Goals   Nutrition Goal ST: continue with current changes LT: lose 30lbs (10 lbs so so far), Getting back into shape    Comments B: couple boiled eggs with fruit and protein shake L or S: chips with salsa, broccoli with ranch dressing, or carrot chips D: chicken with cauliflower rice. BG: ~150 randomly, A1c ~6.7. Discussed HH and DM friendly eating, CHOs, honoring hunger, tips for more satiating and satisfying meals.      Intervention Plan   Intervention Prescribe, educate and counsel regarding individualized specific dietary modifications aiming towards targeted core components such as weight, hypertension, lipid management, diabetes, heart failure and other comorbidities.;Nutrition handout(s) given to patient.    Expected Outcomes Short Term Goal: Understand basic principles of dietary content, such as calories, fat, sodium, cholesterol and nutrients.;Short Term Goal: A plan has been developed with personal nutrition goals set during dietitian appointment.;Long Term Goal: Adherence to prescribed nutrition plan.           Nutrition Discharge:  Nutrition Assessments - 01/08/20 1259      MEDFICTS Scores   Pre Score 72           Education Questionnaire Score:  Knowledge Questionnaire Score - 01/08/20 1259      Knowledge Questionnaire Score   Pre Score 20/26 Education Focus: Depression, Angina, Nutrition, Exercise           Goals reviewed with patient; copy given to patient.

## 2020-06-03 NOTE — Progress Notes (Signed)
Discharge Progress Report  Patient Details  Name: Tim Mccullough MRN: [id number] Date of Birth: Jul 23, 1951 Referring Provider:     Cardiac Rehab from 01/08/2020 in [facility] Cardiac and Pulmonary Rehab  Referring Provider [name] MD       Number of Visits: 86  Reason for Discharge:  Patient reached a stable level of exercise. Patient independent in their exercise. Patient has met program and personal goals.  Smoking History:  Social History   Tobacco Use  Smoking Status Former Smoker  . Years: 20.00  . Types: Cigarettes  . Quit date: 11/09/1998  . Years since quitting: 21.5  Smokeless Tobacco Former Systems developer  . Quit date: 06/03/2007  Tobacco Comment   no alcohol since nov 12    Diagnosis:  Heart failure, chronic systolic (HCC)  ADL UCSD:   Initial Exercise Prescription:  Initial Exercise Prescription - 01/08/20 1100      Date of Initial Exercise RX and Referring Provider   Date 01/08/20    Referring Provider [name] MD      Treadmill   MPH 2.2    Grade 0    Minutes 15    METs 2.68      Recumbant Elliptical   Level 1    RPM 50    Minutes 15    METs 2.7      Elliptical   Level 1    Speed 2.5    Minutes 15      REL-XR   Level 2    Speed 50    Minutes 15    METs 2.7      Prescription Details   Frequency (times per week) 3    Duration Progress to 30 minutes of continuous aerobic without signs/symptoms of physical distress      Intensity   THRR 40-80% of Max Heartrate 115-139    Ratings of Perceived Exertion 11-13    Perceived Dyspnea 0-4      Progression   Progression Continue to progress workloads to maintain intensity without signs/symptoms of physical distress.      Resistance Training   Training Prescription Yes    Weight 4 lb           Discharge Exercise Prescription (Final Exercise Prescription Changes):  Exercise Prescription Changes - 05/28/20 1300      Response to Exercise   Blood Pressure (Admit) 94/62    Blood  Pressure (Exercise) 130/64    Blood Pressure (Exit) 90/60    Heart Rate (Admit) 76 bpm    Heart Rate (Exercise) 103 bpm    Heart Rate (Exit) 82 bpm    Rating of Perceived Exertion (Exercise) 12    Symptoms none    Duration Continue with 30 min of aerobic exercise without signs/symptoms of physical distress.    Intensity THRR unchanged      Progression   Progression Continue to progress workloads to maintain intensity without signs/symptoms of physical distress.    Average METs 3.2      Resistance Training   Training Prescription Yes    Weight 8 lb    Reps 10-15      Interval Training   Interval Training No      REL-XR   Level 7    Speed 50    Minutes 15    METs 3.2      Home Exercise Plan   Plans to continue exercise at Home (comment)   walking   Frequency Add 2 additional days to program exercise  sessions.    Initial Home Exercises Provided 01/12/20           Functional Capacity:  6 Minute Walk    Row Name 01/08/20 1101 05/27/20 1421       6 Minute Walk   Phase Initial Discharge    Distance 1163 feet 1455 feet    Distance % Change -- 25 %    Distance Feet Change -- 292 ft    Walk Time 6 minutes 6 minutes    # of Rest Breaks 0 0    MPH 2.2 2.75    METS 2.71 3.17    RPE 11 12    VO2 Peak 9.49 11.08    Symptoms Yes (comment) No    Comments Left hip starting to be bothersome at end 6/10 --    Resting HR 91 bpm 84 bpm    Resting BP 126/70 94/62    Resting Oxygen Saturation  96 % --    Exercise Oxygen Saturation  during 6 min walk 93 % --    Max Ex. HR 123 bpm 103 bpm    Max Ex. BP 142/70 130/64    2 Minute Post BP 130/60 --           Psychological, QOL, Others - Outcomes: PHQ 2/9: Depression screen [facility] 2/9 03/14/2020 02/05/2020 01/08/2020 04/26/2019 04/08/2018  Decreased Interest _0 0 0  Down, Depressed, Hopeless 0 0 0 0 0  PHQ - 2 Score _1 0 0  Altered sleeping 0 1 1 0 0  Tired, decreased energy _2 0 0  Change in appetite 0 0 1 0 0  Feeling  bad or failure about yourself  0 0 0 0 0  Trouble concentrating 0 0 0 0 0  Moving slowly or fidgety/restless 0 0 0 0 0  Suicidal thoughts 0 0 0 0 0  PHQ-9 Score _3 0 0  Difficult doing work/chores Not difficult at all Somewhat difficult Somewhat difficult Not difficult at all Not difficult at all  Some recent data might be hidden    Quality of Life:  Quality of Life - 01/08/20 1258      Quality of Life   Select Quality of Life      Quality of Life Scores   Health/Function Pre 17.03 %    Socioeconomic Pre 20.79 %    Psych/Spiritual Pre 20.71 %    Family Pre 27 %    GLOBAL Pre 19.82 %           Nutrition & Weight - Outcomes:  Pre Biometrics - 01/08/20 1257      Pre Biometrics   Height 5' 10.4" (1.788 m)    Weight 241 lb 11.2 oz (109.6 kg)    BMI (Calculated) 34.29    Single Leg Stand 26.06 seconds            Nutrition:  Nutrition Therapy & Goals - 02/20/20 1300      Nutrition Therapy   Diet HH, Low Na, DM    Drug/Food Interactions Statins/Certain Fruits    Protein (specify units) 85-90g    Fiber 30 grams    Whole Grain Foods 3 servings    Saturated Fats 12 max. grams    Fruits and Vegetables 5 servings/day    Sodium 1.5 grams      Personal Nutrition Goals   Nutrition Goal ST: continue with current changes LT: lose 30lbs (10 lbs so so far), Getting back into  shape    Comments B: couple boiled eggs with fruit and protein shake L or S: chips with salsa, broccoli with ranch dressing, or carrot chips D: chicken with cauliflower rice. BG: ~150 randomly, A1c ~6.7. Discussed HH and DM friendly eating, CHOs, honoring hunger, tips for more satiating and satisfying meals.      Intervention Plan   Intervention Prescribe, educate and counsel regarding individualized specific dietary modifications aiming towards targeted core components such as weight, hypertension, lipid management, diabetes, heart failure and other comorbidities.;Nutrition handout(s) given to patient.     Expected Outcomes Short Term Goal: Understand basic principles of dietary content, such as calories, fat, sodium, cholesterol and nutrients.;Short Term Goal: A plan has been developed with personal nutrition goals set during dietitian appointment.;Long Term Goal: Adherence to prescribed nutrition plan.           Nutrition Discharge:  Nutrition Assessments - 01/08/20 1259      MEDFICTS Scores   Pre Score 72           Education Questionnaire Score:  Knowledge Questionnaire Score - 01/08/20 1259      Knowledge Questionnaire Score   Pre Score 20/26 Education Focus: Depression, Angina, Nutrition, Exercise           Goals reviewed with patient; copy given to patient.

## 2020-06-05 ENCOUNTER — Ambulatory Visit (INDEPENDENT_AMBULATORY_CARE_PROVIDER_SITE_OTHER): Payer: Medicare Other

## 2020-06-05 DIAGNOSIS — I493 Ventricular premature depolarization: Secondary | ICD-10-CM

## 2020-06-11 ENCOUNTER — Other Ambulatory Visit: Payer: Medicare Other

## 2020-06-24 ENCOUNTER — Other Ambulatory Visit: Payer: Self-pay

## 2020-06-24 ENCOUNTER — Ambulatory Visit (INDEPENDENT_AMBULATORY_CARE_PROVIDER_SITE_OTHER): Payer: Medicare Other

## 2020-06-24 DIAGNOSIS — I493 Ventricular premature depolarization: Secondary | ICD-10-CM | POA: Diagnosis not present

## 2020-06-24 DIAGNOSIS — I428 Other cardiomyopathies: Secondary | ICD-10-CM | POA: Diagnosis not present

## 2020-06-24 DIAGNOSIS — I5042 Chronic combined systolic (congestive) and diastolic (congestive) heart failure: Secondary | ICD-10-CM

## 2020-06-24 LAB — ECHOCARDIOGRAM COMPLETE
Area-P 1/2: 4.39 cm2
Calc EF: 49.7 %
P 1/2 time: 485 msec
S' Lateral: 4.9 cm
Single Plane A2C EF: 44.2 %
Single Plane A4C EF: 53 %

## 2020-06-27 ENCOUNTER — Other Ambulatory Visit: Payer: Self-pay | Admitting: Family Medicine

## 2020-06-28 ENCOUNTER — Other Ambulatory Visit: Payer: Self-pay

## 2020-06-28 DIAGNOSIS — I493 Ventricular premature depolarization: Secondary | ICD-10-CM | POA: Diagnosis not present

## 2020-07-04 ENCOUNTER — Ambulatory Visit: Payer: Medicare Other | Admitting: Family

## 2020-07-04 ENCOUNTER — Other Ambulatory Visit: Payer: Self-pay | Admitting: Family Medicine

## 2020-07-04 ENCOUNTER — Ambulatory Visit (INDEPENDENT_AMBULATORY_CARE_PROVIDER_SITE_OTHER): Payer: Medicare Other | Admitting: Internal Medicine

## 2020-07-04 ENCOUNTER — Other Ambulatory Visit: Payer: Self-pay

## 2020-07-04 ENCOUNTER — Encounter: Payer: Self-pay | Admitting: Internal Medicine

## 2020-07-04 VITALS — BP 100/70 | HR 72 | Ht 70.0 in | Wt 211.0 lb

## 2020-07-04 DIAGNOSIS — I5042 Chronic combined systolic (congestive) and diastolic (congestive) heart failure: Secondary | ICD-10-CM | POA: Diagnosis not present

## 2020-07-04 DIAGNOSIS — I10 Essential (primary) hypertension: Secondary | ICD-10-CM

## 2020-07-04 DIAGNOSIS — Z79899 Other long term (current) drug therapy: Secondary | ICD-10-CM | POA: Diagnosis not present

## 2020-07-04 DIAGNOSIS — I428 Other cardiomyopathies: Secondary | ICD-10-CM

## 2020-07-04 DIAGNOSIS — I472 Ventricular tachycardia: Secondary | ICD-10-CM

## 2020-07-04 DIAGNOSIS — R000 Tachycardia, unspecified: Secondary | ICD-10-CM

## 2020-07-04 NOTE — Progress Notes (Signed)
Patient Care Team: Tonia Ghent, MD as PCP - General (Family Medicine) Wellington Hampshire, MD as PCP - Cardiology (Cardiology)   HPI  Tim Mccullough is a 69 y.o. male seen in  for wide complex beats in a pattern of bigeminy in the setting of nonischemic cardiomyopathy initially diagnosed in 2010.  These records are not available but the old chart reports ejection fraction of 35% and no obstructive coronary disease.  It also mentions a hemodynamically insignificant VSD.  He was started on flecainide. Without improvement dronaderone was not thought to be cost affordable.   5/21 underwent EP testing and attempted ablation of an intramural/septal focus.  PVCs could be suppressed but reemerged following cessation of energy.   He was started on amiodarone 6/21   He has been treated with beta-blockers and Entresto, 2/2 cost >> losartan   Much improved on amiodarone.  Less fatigue.  Less lightheadedness.  Less shortness of breath.  No sensitivity.  No nausea.  Some cough.    History of orthostatic lightheadedness w abrupt standing Diabetes.  Not supposed to have significant neuropathy.   DATE TEST EF    5/13 Echo   50-55 %    1/21 Echo   30-35 %    2/21 LHC   Cors - normal    Date Cr K Mg TSH LFTs Hgb  2/21 1.15 5.1    13.8  3/21  1.32 4.7 1.9<<1.3      6/21    0.882 30              Date PVCs  Multiple ECGS Bigeminy   8/21 3%      Past Medical History:  Diagnosis Date  . Abnormal echocardiogram 2/11   Repeated after med mgmt of cardiomyopathy showed EF 40-45%, mildly dilated LV, mild LV hypertrophy, anterolateral and apical hypokinesis, mild diastolic dysfunction.  . Anemia    hx  . Arthritis   . Blood transfusion without reported diagnosis 09/2012  . BPH (benign prostatic hypertrophy) 2004  . Cancer ([place])    melanoma back  . Cardiomyopathy    ? tachycardia-mediated  . Depression   . Diabetes mellitus 11/2006   Type II  . Diverticulosis   .  Echocardiogram abnormal 7/10   Mild LVH with a mildly dilated LV.  EF was difficult to assess given poor acoustic windows but appeared moderately decreased.  Mild MR.  Probable small perimembranous VSD, mild LAE  . Esophageal erosions   . GERD (gastroesophageal reflux disease)   . Hip pain   . History of MRI 7/10   Cardiac MRI was done to followup difficult echo.  This showed mild to moderately dilated LV, mild to moderate LAE, EF  33% with global hypokinesis, normal RV size with mild systolic dysfunction.  There was no large VSD evident.  There was no myocardial delayed enhancement so no evidence for infiltrative disease or infarction.  LHC (7/10) showed only luminal irreg. in the coronaries and EF 35%  . Holter monitor, abnormal 7/10   Frequent PAC's and PVC's  Average HR 96  (but he had started Toprol XL).  HR range 71-130  . Hyperlipidemia 04/1998  . Hypertension 1995  . Obesity   . OSA (obstructive sleep apnea)    Not using CPAP- improved with weight loss  . Pneumonia    HX OF pna  . Sinus tachycardia    Pt says he has been told his heart rate is high since  he was in high school.   . Sleep apnea    has cpap; but does not use  . VSD (ventricular septal defect)    Probable small perimembranous    Past Surgical History:  Procedure Laterality Date  . CARDIAC CATHETERIZATION  05/30/2009   Dilated cardiomyopathy.  min VSD.  Hypokin Inf  wall.  Multiple min luminal Irr.  EF 35%  . COLONOSCOPY W/ BIOPSIES  08/24/2006   Divertics, polyps x3, bx negative, repeat 2012 with mild diverticulosis in teh sigmoid colon and 1 polyp in tranverse colon, repeat due 2017  . ESOPHAGOGASTRODUODENOSCOPY  2012   1)Barrett's esoph, 2) Erosive esophagitis, 3) Mild gastritis in the antrum, 4) Duodenitis in the bulb of duodenum, 5) Small hiatal hernia  . INGUINAL HERNIA REPAIR  1985   Left  . INGUINAL HERNIA REPAIR  1999   Right  . KNEE ARTHROSCOPY  10/15/2011   Procedure: ARTHROSCOPY KNEE;  Surgeon:  Ninetta Lights, MD;  Location: [facility];  Service: Orthopedics;  Laterality: Right;  right knee scope with lateral meniscectomy, removal loose foreign body, and microfracture technique  . LAMINECTOMY  1987   with discectomy  . PVC ABLATION N/A 03/18/2020   Procedure: PVC ABLATION;  Surgeon: Evans Lance, MD;  Location: [facility];  Service: Cardiovascular;  Laterality: N/A;  . RIGHT/LEFT HEART CATH AND CORONARY ANGIOGRAPHY N/A 12/25/2019   Procedure: RIGHT/LEFT HEART CATH AND CORONARY ANGIOGRAPHY;  Surgeon: Wellington Hampshire, MD;  Location: [facility];  Service: Cardiovascular;  Laterality: N/A;  . ROTATOR CUFF REPAIR  2/11   rt  . TOTAL HIP ARTHROPLASTY  2011   Left  . TOTAL KNEE ARTHROPLASTY  06/01/2012   Procedure: TOTAL KNEE ARTHROPLASTY;  Surgeon: Ninetta Lights, MD;  Location: [facility];  Service: Orthopedics;  Laterality: Right;    Current Meds  Medication Sig  . amiodarone (PACERONE) 200 MG tablet Take 1 tablet (200 mg total) by mouth daily.  Marland Kitchen aspirin 81 MG tablet Take 81 mg by mouth daily.   Marland Kitchen atorvastatin (LIPITOR) 40 MG tablet TAKE 1 TABLET BY MOUTH EVERY DAY  . doxazosin (CARDURA) 1 MG tablet TAKE 1 TABLET BY MOUTH EVERY DAY  . fish oil-omega-3 fatty acids 1000 MG capsule Take 1 g by mouth 2 (two) times daily.   Marland Kitchen losartan (COZAAR) 25 MG tablet Take 1 tablet (25 mg) by mouth once daily at bedtime  . magnesium oxide (MAG-OX) 400 (241.3 Mg) MG tablet Take 1 tablet (400 mg total) by mouth daily.  . metFORMIN (GLUCOPHAGE) 500 MG tablet TAKE 2 TABLETS (1,000 MG TOTAL) BY MOUTH 2 (TWO) TIMES DAILY WITH A MEAL.  . metoprolol tartrate (LOPRESSOR) 25 MG tablet TAKE 1.5 TABLETS (37.5 MG TOTAL) BY MOUTH 2 (TWO) TIMES DAILY.  Marland Kitchen omeprazole (PRILOSEC) 20 MG capsule Take 1 capsule (20 mg total) by mouth daily.  . ONE TOUCH ULTRA TEST test strip CHECK BLOOD SUGAR DAILY AND IF BLOOD SUGAR FLUCTUATES. 250.00  . PARoxetine (PAXIL) 20 MG tablet Take 1 tablet (20  mg total) by mouth daily.  Marland Kitchen spironolactone (ALDACTONE) 25 MG tablet Take 0.5 tablet (12.5 mg) by mouth once daily at bedtime (Patient taking differently: Take 25 mg by mouth daily. )    No Known Allergies    Review of Systems negative except from HPI and PMH  Physical Exam BP 100/70   Pulse 72   Ht 5\' 10"  (1.778 m)   Wt 211 lb (95.7 kg)  SpO2 95%   BMI 30.28 kg/m  Well developed and well nourished in no acute distress HENT normal Neck supple with JVP-flat Clear Regular rate and rhythm, no  gallop No  murmur Abd-soft with active BS No Clubbing cyanosis   edema Skin-warm and dry A & Oriented  Grossly normal sensory and motor function  ECG sinus at 72 Intervals 18/15/46 Right bundle branch block  Assessment and  Plan Nonischemic cardiomyopathy  Congestive heart failure-chronic-systolic class III   Bigeminal wide-complex beat,  relatively narrow with a prolonged intrinsicoid deflection and a fused appearance suggestive of a junctional trigger capturing the intrinsic conduction system.  Suppressed on amiodarone  High Risk Medication Surveillance amiodarone/Aldactone \ Sleep disordered breathing/sleep apnea   Diabetes   Hypertension  Orthostatic hypotension   Much improved on amiodarone.  We will continue.  We will check amiodarone surveillance laboratories.  Of some concern is the cough as a marker of amiodarone lung injury.  Not withstanding the lack of improvement in his ejection fraction, his symptomatic improvement is so great, we will proceed with referring to Dr Noralee Stain patient is agreeable.  Euvolemic continue current meds probably class II now  Check labs on Aldactone.       Current medicines are reviewed at length with the patient today .  The patient does not  have concerns regarding medicines.

## 2020-07-04 NOTE — Progress Notes (Deleted)
Office Visit    Patient Name: Tim Mccullough Date of Encounter: 07/04/2020  Primary Care Provider:  Tonia Ghent, MD Primary Cardiologist:  [name], MD Electrophysiologist:  None   Chief Complaint    Tim Mccullough is a 69 y.o. male with a hx of chronic systolic HF due to NICM initially diagnosed 2010, HLD, DM2, HTN, frequent PAC, PVC presents today for ***  Past Medical History    Past Medical History:  Diagnosis Date  . Abnormal echocardiogram 2/11   Repeated after med mgmt of cardiomyopathy showed EF 40-45%, mildly dilated LV, mild LV hypertrophy, anterolateral and apical hypokinesis, mild diastolic dysfunction.  . Anemia    hx  . Arthritis   . Blood transfusion without reported diagnosis 09/2012  . BPH (benign prostatic hypertrophy) 2004  . Cancer ([place])    melanoma back  . Cardiomyopathy    ? tachycardia-mediated  . Depression   . Diabetes mellitus 11/2006   Type II  . Diverticulosis   . Echocardiogram abnormal 7/10   Mild LVH with a mildly dilated LV.  EF was difficult to assess given poor acoustic windows but appeared moderately decreased.  Mild MR.  Probable small perimembranous VSD, mild LAE  . Esophageal erosions   . GERD (gastroesophageal reflux disease)   . Hip pain   . History of MRI 7/10   Cardiac MRI was done to followup difficult echo.  This showed mild to moderately dilated LV, mild to moderate LAE, EF  33% with global hypokinesis, normal RV size with mild systolic dysfunction.  There was no large VSD evident.  There was no myocardial delayed enhancement so no evidence for infiltrative disease or infarction.  LHC (7/10) showed only luminal irreg. in the coronaries and EF 35%  . Holter monitor, abnormal 7/10   Frequent PAC's and PVC's  Average HR 96  (but he had started Toprol XL).  HR range 71-130  . Hyperlipidemia 04/1998  . Hypertension 1995  . Obesity   . OSA (obstructive sleep apnea)    Not using CPAP- improved with weight loss  .  Pneumonia    HX OF pna  . Sinus tachycardia    Pt says he has been told his heart rate is high since he was in high school.   . Sleep apnea    has cpap; but does not use  . VSD (ventricular septal defect)    Probable small perimembranous   Past Surgical History:  Procedure Laterality Date  . CARDIAC CATHETERIZATION  05/30/2009   Dilated cardiomyopathy.  min VSD.  Hypokin Inf  wall.  Multiple min luminal Irr.  EF 35%  . COLONOSCOPY W/ BIOPSIES  08/24/2006   Divertics, polyps x3, bx negative, repeat 2012 with mild diverticulosis in teh sigmoid colon and 1 polyp in tranverse colon, repeat due 2017  . ESOPHAGOGASTRODUODENOSCOPY  2012   1)Barrett's esoph, 2) Erosive esophagitis, 3) Mild gastritis in the antrum, 4) Duodenitis in the bulb of duodenum, 5) Small hiatal hernia  . INGUINAL HERNIA REPAIR  1985   Left  . INGUINAL HERNIA REPAIR  1999   Right  . KNEE ARTHROSCOPY  10/15/2011   Procedure: ARTHROSCOPY KNEE;  Surgeon: Ninetta Lights, MD;  Location: [place];  Service: Orthopedics;  Laterality: Right;  right knee scope with lateral meniscectomy, removal loose foreign body, and microfracture technique  . LAMINECTOMY  1987   with discectomy  . PVC ABLATION N/A 03/18/2020   Procedure: PVC ABLATION;  Surgeon:  Evans Lance, MD;  Location: [place] [facility];  Service: Cardiovascular;  Laterality: N/A;  . RIGHT/LEFT HEART CATH AND CORONARY ANGIOGRAPHY N/A 12/25/2019   Procedure: RIGHT/LEFT HEART CATH AND CORONARY ANGIOGRAPHY;  Surgeon: Wellington Hampshire, MD;  Location: [place] [facility];  Service: Cardiovascular;  Laterality: N/A;  . ROTATOR CUFF REPAIR  2/11   rt  . TOTAL HIP ARTHROPLASTY  2011   Left  . TOTAL KNEE ARTHROPLASTY  06/01/2012   Procedure: TOTAL KNEE ARTHROPLASTY;  Surgeon: Ninetta Lights, MD;  Location: [place];  Service: Orthopedics;  Laterality: Right;   Allergies  No Known Allergies  History of Present Illness    Tim Mccullough is a 69 y.o. male  with a hx of chronic systolic HF due to NICM initially diagnosed 2010, HLD, DM2, HTN, frequent PAC, PVC . He was last seen by Dr. Caryl Comes 04/11/20.  An ICM with systolic heart failure initially dx 2010. Cardiac cath with minor luminal irregularities and no obstructive CAD, EF 35%. At that time noted probable small, hemodynamically insignificant VSD that did not explain his cardiomyopathy. Cardiomyopathy thought to be tachy-mediated. 03/2012 EF 50-55%. Carotid doppler 2017 for carotid bruit with mild bilateral ICA disease and moderate common carotid disease.   At office visit 10/23/19 increased DOE. As his previous echo was in 2013, an updated echocardiogram was ordered. Echo 11/22/19 showed LVEF 30-35%, mildly dilated LV, RV normal size/function, LA mildly dilated, no significant valvular abnormality.  He has been noted to have frequent PVCs which are overall asymptomatic.  Underwent right and left heart cath 12/25/19 which showed normal coronary arteries, mildly elevated filling pressure (PCW 77mmHg), mild pulmonary hypertension (37/21 mmHg), and mildly reduced cardiac output at 4.69 with cardiac index 2.09.  He has previously been unable to afford Entresto.  03/2020 he underwent EP testing and attempted ablation of intramural/septal focus. However, PVCs re-emerged. He was started on Dronaderone which was expensive and not effective in suppressing PVCs. At clinic visit 04/11/20 with Dr. Caryl Comes he was recommended to start Amiodarone. He worse a ZIO monitor 8 weeks after initiation of Amiodarone. Preliminary review shows predominantly NSR. He had PVCs with 3% burden. Noted ventricular bigeminy and trigeminy with longest episodes 15.3 seconds and 54min30sec respectively. ***   EKGs/Labs/Other Studies Reviewed:   The following studies were reviewed today:  Echo 11/22/19 1. Left ventricular ejection fraction, by visual estimation, is 30 to 35%. The left ventricle has moderate to severely decreased function. There  is no left ventricular hypertrophy.  2. Left ventricular diastolic function could not be evaluated.  3. Mildly dilated left ventricular internal cavity size.  4. The left ventricle demonstrates global hypokinesis.  5. Global right ventricle has mildly reduced systolic function.The right ventricular size is normal. Right vetricular wall thickness was not assessed.  6. Left atrial size was mildly dilated.  7. Right atrial size was normal.  8. The mitral valve is normal in structure. No evidence of mitral valve regurgitation.  9. The tricuspid valve is grossly normal. 10. The aortic valve is grossly normal. Aortic valve regurgitation is not visualized. 11. Pulmonic regurgitation not assessed. 12. The pulmonic valve was not well visualized. Pulmonic valve regurgitation not assessed.    Cath 12/25/19 1.  Normal coronary arteries. 2.  Right heart catheterization showed mildly elevated filling pressures (PCW of 14 mmHg), mild pulmonary hypertension (37/21 mmHg) and mildly reduced cardiac output at 4.69 with a cardiac index of 2.09.   Recommendations: Continue medical therapy for nonischemic  cardiomyopathy. Consider EP evaluation for treatment of PVCs which might be contributing to worsening cardiomyopathy.   EKG:  EKG is ordered today.  The ekg ordered today demonstrates SR 88 bpm with RBBB and PVC in pattern of bigeminy.***  Recent Labs: 01/30/2020: Magnesium 1.9 03/15/2020: BUN 27; Creatinine, Ser 1.09; Hemoglobin 13.1; Platelets 228; Potassium 4.4; Sodium 139 04/11/2020: ALT 30; TSH 0.882  Recent Lipid Panel    Component Value Date/Time   CHOL 142 08/21/2019 0819   CHOL 178 08/28/2016 0806   TRIG 137.0 08/21/2019 0819   HDL 38.70 (L) 08/21/2019 0819   HDL 48 08/28/2016 0806   CHOLHDL 4 08/21/2019 0819   VLDL 27.4 08/21/2019 0819   LDLCALC 76 08/21/2019 0819   LDLCALC 106 (H) 08/28/2016 0806   LDLDIRECT 138.8 06/17/2011 0850    Home Medications   No outpatient medications have been  marked as taking for the 07/04/20 encounter (Appointment) with Loel Dubonnet, NP.    Review of Systems     *** Review of Systems  Constitutional: Negative for chills, fever and malaise/fatigue.  Cardiovascular: Positive for dyspnea on exertion and palpitations. Negative for chest pain, leg swelling, near-syncope, orthopnea and syncope.  Respiratory: Negative for cough, shortness of breath and wheezing.   Gastrointestinal: Negative for nausea and vomiting.  Neurological: Negative for dizziness, light-headedness and weakness.   All other systems reviewed and are otherwise negative except as noted above.  Physical Exam   *** VS:  There were no vitals taken for this visit. , BMI There is no height or weight on file to calculate BMI. GEN: Well nourished, well developed, in no acute distress. HEENT: normal. Neck: Supple, no JVD, carotid bruits, or masses. Cardiac: RRR, no murmurs, rubs, or gallops. No clubbing, cyanosis, edema.  Radials/DP/PT 2+ and equal bilaterally.  Respiratory:  Respirations regular and unlabored, clear to auscultation bilaterally. GI: Soft, nontender, nondistended, BS + x 4. MS: No deformity or atrophy. Skin: Warm and dry, no rash.  Neuro:  Strength and sensation are intact. Psych: Normal affect.  Assessment & Plan   *** 1. Chronic systolic heart failure/NICM - Initial dx tachymediated cardiomyopathy in 2010. Echo 11/2019 with LVEF 30-35%, mildly dilated LV. Cath 12/2019 with normal coronary arteries. *** 2. PAC/PVC -*** 3. HTN - *** 4. HLD - Lipid panel 08/21/19 with LDL 76. Continue Omega 3 and Atorvastatin 40mg . LDL goal <100 as recent cardiac cath with normal coronary arteries.  5. DM2 - Follows with his PCP.  *** 6. Bilateral carotid disease - Carotid doppler 07/2017 with mild nonobstructive disease. *** 7. RBBB - ***  Disposition: Follow up*** in 1 month(s) with Dr. Fletcher Anon or APP.   Loel Dubonnet, NP 07/04/2020, 7:39 AM

## 2020-07-04 NOTE — Patient Instructions (Signed)
Medication Instructions:  - Your physician recommends that you continue on your current medications as directed. Please refer to the Current Medication list given to you today.  *If you need a refill on your cardiac medications before your next appointment, please call your pharmacy*   Lab Work: - Your physician recommends that you have lab work today: CMET/ TSH/ Magnesium  If you have labs (blood work) drawn today and your tests are completely normal, you will receive your results only by: Marland Kitchen MyChart Message (if you have MyChart) OR . A paper copy in the mail If you have any lab test that is abnormal or we need to change your treatment, we will call you to review the results.   Testing/Procedures: - Your physician has requested that you have an echocardiogram in 3-4 months. Echocardiography is a painless test that uses sound waves to create images of your heart. It provides your doctor with information about the size and shape of your heart and how well your heart's chambers and valves are working. This procedure takes approximately one hour. There are no restrictions for this procedure. There is a possibility that an IV may need to be started during your test to inject an image enhancing agent. This is done to obtain more optimal pictures of your heart. Therefore we ask that you do at least drink some water prior to coming in to hydrate your veins.    Follow-Up: At [facility], you and your health needs are our priority.  As part of our continuing mission to provide you with exceptional heart care, we have created designated Provider Care Teams.  These Care Teams include your primary Cardiologist (physician) and Advanced Practice Providers (APPs -  Physician Assistants and Nurse Practitioners) who all work together to provide you with the care you need, when you need it.  We recommend signing up for the patient portal called "MyChart".  Sign up information is provided on this After Visit  Summary.  MyChart is used to connect with patients for Virtual Visits (Telemedicine).  Patients are able to view lab/test results, encounter notes, upcoming appointments, etc.  Non-urgent messages can be sent to your provider as well.   To learn more about what you can do with MyChart, go to [email or web address].    Your next appointment:   4 months  The format for your next appointment:   In Person  Provider:   Virl Axe, MD   Other Instructions - Dr. Floria Raveling office to will call you

## 2020-07-05 LAB — COMPREHENSIVE METABOLIC PANEL
ALT: 24 IU/L (ref 0–44)
AST: 21 IU/L (ref 0–40)
Albumin/Globulin Ratio: 1.6 (ref 1.2–2.2)
Albumin: 4.3 g/dL (ref 3.8–4.8)
Alkaline Phosphatase: 70 IU/L (ref 48–121)
BUN/Creatinine Ratio: 27 — ABNORMAL HIGH (ref 10–24)
BUN: 25 mg/dL (ref 8–27)
Bilirubin Total: 0.3 mg/dL (ref 0.0–1.2)
CO2: 23 mmol/L (ref 20–29)
Calcium: 9.2 mg/dL (ref 8.6–10.2)
Chloride: 99 mmol/L (ref 96–106)
Creatinine, Ser: 0.91 mg/dL (ref 0.76–1.27)
GFR calc Af Amer: 100 mL/min/{1.73_m2} (ref >59)
GFR calc non Af Amer: 86 mL/min/{1.73_m2} (ref >59)
Globulin, Total: 2.7 g/dL (ref 1.5–4.5)
Glucose: 154 mg/dL — ABNORMAL HIGH (ref 65–99)
Potassium: 4.8 mmol/L (ref 3.5–5.2)
Sodium: 138 mmol/L (ref 134–144)
Total Protein: 7 g/dL (ref 6.0–8.5)

## 2020-07-05 LAB — MAGNESIUM: Magnesium: 1.4 mg/dL — ABNORMAL LOW (ref 1.6–2.3)

## 2020-07-05 LAB — TSH: TSH: 1.9 u[IU]/mL (ref 0.450–4.500)

## 2020-07-11 ENCOUNTER — Ambulatory Visit: Payer: Medicare Other | Admitting: Cardiovascular Disease

## 2020-07-16 ENCOUNTER — Telehealth: Payer: Self-pay | Admitting: Internal Medicine

## 2020-07-16 MED ORDER — CARVEDILOL 12.5 MG PO TABS: 12.5000 mg | ORAL_TABLET | Freq: Two times a day (BID) | ORAL | 6 refills | Status: DC

## 2020-07-16 MED ORDER — MAGNESIUM OXIDE 400 (241.3 MG) MG PO TABS: 400.0000 mg | ORAL_TABLET | Freq: Two times a day (BID) | ORAL | 6 refills | Status: DC

## 2020-07-16 NOTE — Telephone Encounter (Signed)
Deboraha Sprang, MD  07/05/2020 1:48 PM EDT     Please Inform Patient Echo showed stable heart muscle function  Also we should change from meto tartrate to carvedilol 12.5 bid . Thanks SK     Deboraha Sprang, MD  07/12/2020 9:19 PM EDT     Please Inform Patient  Labs are normal x low Mag  lets have him take BID   Thanks

## 2020-07-16 NOTE — Telephone Encounter (Signed)
I spoke with the patient regarding his echo & lab results.  He is aware of Dr. Caryl Comes recommendations to: 1) STOP metoprolol 2) START carvedilol 3) START magnesium oxide 400 mg BID  The patient voices understanding of his results and the above recommendations. He is agreeable to the above.  RX updated at the pharmacy.

## 2020-07-23 DIAGNOSIS — I493 Ventricular premature depolarization: Secondary | ICD-10-CM | POA: Diagnosis not present

## 2020-07-29 ENCOUNTER — Telehealth: Payer: Self-pay | Admitting: Internal Medicine

## 2020-07-29 NOTE — Telephone Encounter (Signed)
I called and spoke with the patient. He states he requested a refill for amiodarone at the pharmacy, but they filled his metoprolol. He wanted to make sure his medications were correct that we had on file.  I advised that as of 07/16/20 Dr. Caryl Comes recommended that we stop metopolol and start coreg 12.5 mg BID. The patient states he has been taking his coreg as prescribed.  He will follow up with the pharmacy as I think they filled his metoprolol in error when they should have filled his amiodarone.  The patient asked for an updated medication list. I advised I could get this for him. He would like to come to the office to pick this up.   I advised I will have this ready for him this afternoon. He voices understanding and is agreeable.  Current med list printed and placed at the front desk.

## 2020-07-29 NOTE — Telephone Encounter (Signed)
Patient would like to discuss his medication list. States he wants to make sure he is taking the correct medications.

## 2020-08-22 DIAGNOSIS — I110 Hypertensive heart disease with heart failure: Secondary | ICD-10-CM | POA: Diagnosis not present

## 2020-08-22 DIAGNOSIS — I5022 Chronic systolic (congestive) heart failure: Secondary | ICD-10-CM | POA: Diagnosis not present

## 2020-08-22 DIAGNOSIS — N400 Enlarged prostate without lower urinary tract symptoms: Secondary | ICD-10-CM | POA: Diagnosis not present

## 2020-08-22 DIAGNOSIS — E119 Type 2 diabetes mellitus without complications: Secondary | ICD-10-CM | POA: Diagnosis not present

## 2020-08-22 DIAGNOSIS — E785 Hyperlipidemia, unspecified: Secondary | ICD-10-CM | POA: Diagnosis not present

## 2020-08-22 DIAGNOSIS — K219 Gastro-esophageal reflux disease without esophagitis: Secondary | ICD-10-CM | POA: Diagnosis not present

## 2020-08-22 DIAGNOSIS — I340 Nonrheumatic mitral (valve) insufficiency: Secondary | ICD-10-CM | POA: Diagnosis not present

## 2020-08-22 DIAGNOSIS — I428 Other cardiomyopathies: Secondary | ICD-10-CM | POA: Diagnosis not present

## 2020-08-22 DIAGNOSIS — G4733 Obstructive sleep apnea (adult) (pediatric): Secondary | ICD-10-CM | POA: Diagnosis not present

## 2020-08-22 DIAGNOSIS — I493 Ventricular premature depolarization: Secondary | ICD-10-CM | POA: Diagnosis not present

## 2020-08-23 DIAGNOSIS — E785 Hyperlipidemia, unspecified: Secondary | ICD-10-CM | POA: Diagnosis not present

## 2020-08-23 DIAGNOSIS — G4733 Obstructive sleep apnea (adult) (pediatric): Secondary | ICD-10-CM | POA: Diagnosis not present

## 2020-08-23 DIAGNOSIS — E119 Type 2 diabetes mellitus without complications: Secondary | ICD-10-CM | POA: Diagnosis not present

## 2020-08-23 DIAGNOSIS — I5022 Chronic systolic (congestive) heart failure: Secondary | ICD-10-CM | POA: Diagnosis not present

## 2020-08-23 DIAGNOSIS — I110 Hypertensive heart disease with heart failure: Secondary | ICD-10-CM | POA: Diagnosis not present

## 2020-08-23 DIAGNOSIS — Z9889 Other specified postprocedural states: Secondary | ICD-10-CM | POA: Diagnosis not present

## 2020-08-23 DIAGNOSIS — I493 Ventricular premature depolarization: Secondary | ICD-10-CM | POA: Diagnosis not present

## 2020-08-26 ENCOUNTER — Encounter: Payer: Self-pay | Admitting: Family Medicine

## 2020-08-26 ENCOUNTER — Other Ambulatory Visit: Payer: Self-pay | Admitting: Family Medicine

## 2020-08-26 ENCOUNTER — Other Ambulatory Visit: Payer: Self-pay

## 2020-08-26 DIAGNOSIS — Z9889 Other specified postprocedural states: Secondary | ICD-10-CM | POA: Insufficient documentation

## 2020-08-26 MED ORDER — LOSARTAN POTASSIUM 25 MG PO TABS: ORAL_TABLET | ORAL | 0 refills | Status: DC

## 2020-08-26 NOTE — Telephone Encounter (Signed)
*  STAT* If patient is at the pharmacy, call can be transferred to refill team.   1. Which medications need to be refilled? (please list name of each medication and dose if known) Losartan  2. Which pharmacy/location (including street and city if local pharmacy) is medication to be sent to? [facility]  3. Do they need a 30 day or 90 day supply? [place]

## 2020-09-15 DIAGNOSIS — Z23 Encounter for immunization: Secondary | ICD-10-CM | POA: Diagnosis not present

## 2020-09-25 ENCOUNTER — Other Ambulatory Visit: Payer: Self-pay | Admitting: Gastroenterology

## 2020-09-25 DIAGNOSIS — K2270 Barrett's esophagus without dysplasia: Secondary | ICD-10-CM

## 2020-09-29 ENCOUNTER — Other Ambulatory Visit: Payer: Self-pay | Admitting: Internal Medicine

## 2020-09-29 ENCOUNTER — Other Ambulatory Visit: Payer: Self-pay | Admitting: Family Medicine

## 2020-09-30 NOTE — Telephone Encounter (Signed)
Pharmacy requests refill on: Atorvastatin 40 mg  LAST REFILL: 07/15/2020 LAST OV: 11/28/2019 NEXT OV: Not Scheduled  PHARMACY: [place], [place]

## 2020-10-02 ENCOUNTER — Other Ambulatory Visit: Payer: Self-pay

## 2020-10-02 ENCOUNTER — Ambulatory Visit (INDEPENDENT_AMBULATORY_CARE_PROVIDER_SITE_OTHER): Payer: Medicare Other

## 2020-10-02 DIAGNOSIS — I5042 Chronic combined systolic (congestive) and diastolic (congestive) heart failure: Secondary | ICD-10-CM | POA: Diagnosis not present

## 2020-10-02 DIAGNOSIS — I428 Other cardiomyopathies: Secondary | ICD-10-CM

## 2020-10-02 DIAGNOSIS — R000 Tachycardia, unspecified: Secondary | ICD-10-CM

## 2020-10-02 DIAGNOSIS — I472 Ventricular tachycardia: Secondary | ICD-10-CM

## 2020-10-02 LAB — ECHOCARDIOGRAM COMPLETE
Area-P 1/2: 2.09 cm2
Calc EF: 51.7 %
P 1/2 time: 588 msec
S' Lateral: 4.7 cm
Single Plane A2C EF: 52.8 %
Single Plane A4C EF: 51.3 %

## 2020-10-02 MED ORDER — PERFLUTREN LIPID MICROSPHERE
1.0000 mL | INTRAVENOUS | Status: AC | PRN
  Administered 2020-10-02: 2 mL via INTRAVENOUS

## 2020-10-17 ENCOUNTER — Telehealth: Payer: Self-pay | Admitting: Internal Medicine

## 2020-10-17 NOTE — Telephone Encounter (Signed)
I called and spoke with the patient about his echo results. He voices understanding.   He advised me he is to follow up with Dr. Melvyn Neth on 10/22/20.

## 2020-10-17 NOTE — Telephone Encounter (Signed)
Deboraha Sprang, MD  10/17/2020 12:33 PM EST      Please Inform Patient Echo showed mostly stable heart muscle function around 35 %

## 2020-10-17 NOTE — Telephone Encounter (Signed)
Patient calling in regarding results from echo on 11/24

## 2020-10-22 DIAGNOSIS — I493 Ventricular premature depolarization: Secondary | ICD-10-CM | POA: Diagnosis not present

## 2020-10-31 DIAGNOSIS — I493 Ventricular premature depolarization: Secondary | ICD-10-CM | POA: Diagnosis not present

## 2020-11-08 DIAGNOSIS — I493 Ventricular premature depolarization: Secondary | ICD-10-CM | POA: Diagnosis not present

## 2020-11-20 NOTE — Progress Notes (Signed)
Patient Care Team: Tonia Ghent, MD as PCP - General (Family Medicine) Wellington Hampshire, MD as PCP - Cardiology (Cardiology)   HPI  Tim Mccullough is a 70 y.o. male seen in  for wide complex beats in a pattern of bigeminy in the setting of nonischemic cardiomyopathy initially diagnosed in 2010.  These records are not available but the old chart reports ejection fraction of 35% and no obstructive coronary disease.  It also mentions a hemodynamically insignificant VSD.  He was started on flecainide. Without improvement dronaderone was not thought to be cost affordable.   5/21 underwent EP testing and attempted ablation of an intramural/septal focus.  PVCs could be suppressed but reemerged following cessation of energy.  He was started on amiodarone 6/21 Referred to Dr Noralee Stain.  Ablation 10/21.  Amiodarone was discontinued.  No recurrent ectopy.   He has been treated with beta-blockers and Entresto, 2/2 cost >> losartan  . Much improved.  Energy.  Less dyspnea.  No edema.  No chest pain.   Orthostatic lightheadedness is better   DATE TEST EF    5/13 Echo   50-55 %    1/21 Echo   30-35 %    2/21 LHC   Cors - normal  11/21 Echo  30-35%     Date Cr K Mg TSH LFTs Hgb  2/21 1.15 5.1    13.8  3/21  1.32 4.7 1.9<<1.3      6/21    0.882 30   10/21 0.9 3.9 1.6<<1.4        Date PVCs  Multiple ECGS Bigeminy   8/21 3%      Past Medical History:  Diagnosis Date  . Abnormal echocardiogram 2/11   Repeated after med mgmt of cardiomyopathy showed EF 40-45%, mildly dilated LV, mild LV hypertrophy, anterolateral and apical hypokinesis, mild diastolic dysfunction.  . Anemia    hx  . Arthritis   . Blood transfusion without reported diagnosis 09/2012  . BPH (benign prostatic hypertrophy) 2004  . Cancer ([place])    melanoma back  . Cardiomyopathy    ? tachycardia-mediated  . Depression   . Diabetes mellitus 11/2006   Type II  . Diverticulosis   . Echocardiogram abnormal  7/10   Mild LVH with a mildly dilated LV.  EF was difficult to assess given poor acoustic windows but appeared moderately decreased.  Mild MR.  Probable small perimembranous VSD, mild LAE  . Esophageal erosions   . GERD (gastroesophageal reflux disease)   . H/O cardiac radiofrequency ablation   . Hip pain   . History of MRI 7/10   Cardiac MRI was done to followup difficult echo.  This showed mild to moderately dilated LV, mild to moderate LAE, EF  33% with global hypokinesis, normal RV size with mild systolic dysfunction.  There was no large VSD evident.  There was no myocardial delayed enhancement so no evidence for infiltrative disease or infarction.  LHC (7/10) showed only luminal irreg. in the coronaries and EF 35%  . Holter monitor, abnormal 7/10   Frequent PAC's and PVC's  Average HR 96  (but he had started Toprol XL).  HR range 71-130  . Hyperlipidemia 04/1998  . Hypertension 1995  . Obesity   . OSA (obstructive sleep apnea)    Not using CPAP- improved with weight loss  . Pneumonia    HX OF pna  . Sinus tachycardia    Pt says he has been told his  heart rate is high since he was in high school.   . Sleep apnea    has cpap; but does not use  . VSD (ventricular septal defect)    Probable small perimembranous    Past Surgical History:  Procedure Laterality Date  . CARDIAC CATHETERIZATION  05/30/2009   Dilated cardiomyopathy.  min VSD.  Hypokin Inf  wall.  Multiple min luminal Irr.  EF 35%  . COLONOSCOPY W/ BIOPSIES  08/24/2006   Divertics, polyps x3, bx negative, repeat 2012 with mild diverticulosis in teh sigmoid colon and 1 polyp in tranverse colon, repeat due 2017  . ESOPHAGOGASTRODUODENOSCOPY  2012   1)Barrett's esoph, 2) Erosive esophagitis, 3) Mild gastritis in the antrum, 4) Duodenitis in the bulb of duodenum, 5) Small hiatal hernia  . INGUINAL HERNIA REPAIR  1985   Left  . INGUINAL HERNIA REPAIR  1999   Right  . KNEE ARTHROSCOPY  10/15/2011   Procedure: ARTHROSCOPY  KNEE;  Surgeon: Ninetta Lights, MD;  Location: [place];  Service: Orthopedics;  Laterality: Right;  right knee scope with lateral meniscectomy, removal loose foreign body, and microfracture technique  . LAMINECTOMY  1987   with discectomy  . PVC ABLATION N/A 03/18/2020   Procedure: PVC ABLATION;  Surgeon: Evans Lance, MD;  Location: [place] [facility];  Service: Cardiovascular;  Laterality: N/A;  . RIGHT/LEFT HEART CATH AND CORONARY ANGIOGRAPHY N/A 12/25/2019   Procedure: RIGHT/LEFT HEART CATH AND CORONARY ANGIOGRAPHY;  Surgeon: Wellington Hampshire, MD;  Location: [place] [facility];  Service: Cardiovascular;  Laterality: N/A;  . ROTATOR CUFF REPAIR  2/11   rt  . TOTAL HIP ARTHROPLASTY  2011   Left  . TOTAL KNEE ARTHROPLASTY  06/01/2012   Procedure: TOTAL KNEE ARTHROPLASTY;  Surgeon: Ninetta Lights, MD;  Location: [place];  Service: Orthopedics;  Laterality: Right;    Current Meds  Medication Sig  . aspirin 81 MG tablet Take 81 mg by mouth daily.   Marland Kitchen atorvastatin (LIPITOR) 40 MG tablet TAKE 1 TABLET BY MOUTH EVERY DAY  . carvedilol (COREG) 12.5 MG tablet Take 1 tablet (12.5 mg total) by mouth 2 (two) times daily.  Marland Kitchen doxazosin (CARDURA) 1 MG tablet TAKE 1 TABLET BY MOUTH EVERY DAY  . fish oil-omega-3 fatty acids 1000 MG capsule Take 1 g by mouth 2 (two) times daily.   Marland Kitchen losartan (COZAAR) 25 MG tablet TAKE 1 TABLET (25 MG) BY MOUTH ONCE DAILY AT BEDTIME  . magnesium oxide (MAG-OX) 400 (241.3 Mg) MG tablet Take 1 tablet (400 mg total) by mouth 2 (two) times daily.  . metFORMIN (GLUCOPHAGE) 500 MG tablet TAKE 2 TABLETS (1,000 MG TOTAL) BY MOUTH 2 (TWO) TIMES DAILY WITH A MEAL.  Marland Kitchen omeprazole (PRILOSEC) 20 MG capsule Take 1 capsule (20 mg total) by mouth daily.  . ONE TOUCH ULTRA TEST test strip CHECK BLOOD SUGAR DAILY AND IF BLOOD SUGAR FLUCTUATES. 250.00  . PARoxetine (PAXIL) 20 MG tablet TAKE 1 TABLET BY MOUTH EVERY DAY  . spironolactone (ALDACTONE) 25 MG tablet Take 0.5  tablet (12.5 mg) by mouth once daily at bedtime    No Known Allergies    Review of Systems negative except from HPI and PMH  Physical Exam BP 117/81 (BP Location: Left Arm, Patient Position: Sitting, Cuff Size: Normal)   Pulse 87   Ht 5\' 10"  (1.778 m)   Wt 218 lb (98.9 kg)   SpO2 96%   BMI 31.28 kg/m  Well developed  and well nourished in no acute distress HENT normal Neck supple with JVP-flat Clear Device pocket well healed; without hematoma or erythema.  There is no tethering  Regular rate and rhythm, no  murmur Abd-soft with active BS No Clubbing cyanosis   edema Skin-warm and dry A & Oriented  Grossly normal sensory and motor function  ECG sinus at 87 Interval 17/14/41 Right bundle branch block  Assessment and  Plan Nonischemic cardiomyopathy  Congestive heart failure-chronic-systolic class II   Bigeminal wide-complex beat,  relatively narrow with a prolonged intrinsicoid deflection and a fused appearance suggestive of a junctional trigger capturing the intrinsic conduction system.  Ablation GT 5/21; [place] 10/21  High Risk Medication Surveillance  Aldactone \ Sleep disordered breathing/sleep apnea   Diabetes   Hypertension  Orthostatic hypotension   Continues significantly improved, no longer on amiodarone following ablation.  Heart failure status is improved.  Less shortness of breath and increased energy.  Continue guideline directed therapy will reassess left ventricular function in about 5 months.  Potassium level appropriate on spironolactone  Orthostasis is quiescient     Current medicines are reviewed at length with the patient today .  The patient does not  have concerns regarding medicines.

## 2020-11-21 ENCOUNTER — Ambulatory Visit (INDEPENDENT_AMBULATORY_CARE_PROVIDER_SITE_OTHER): Payer: Medicare HMO | Admitting: Internal Medicine

## 2020-11-21 ENCOUNTER — Encounter: Payer: Self-pay | Admitting: Internal Medicine

## 2020-11-21 ENCOUNTER — Other Ambulatory Visit: Payer: Self-pay

## 2020-11-21 VITALS — BP 117/81 | HR 87 | Ht 70.0 in | Wt 218.0 lb

## 2020-11-21 DIAGNOSIS — I5042 Chronic combined systolic (congestive) and diastolic (congestive) heart failure: Secondary | ICD-10-CM

## 2020-11-21 DIAGNOSIS — I428 Other cardiomyopathies: Secondary | ICD-10-CM | POA: Diagnosis not present

## 2020-11-21 DIAGNOSIS — I10 Essential (primary) hypertension: Secondary | ICD-10-CM | POA: Diagnosis not present

## 2020-11-21 DIAGNOSIS — I472 Ventricular tachycardia: Secondary | ICD-10-CM

## 2020-11-21 DIAGNOSIS — R000 Tachycardia, unspecified: Secondary | ICD-10-CM

## 2020-11-21 NOTE — Patient Instructions (Signed)
Medication Instructions:  - Your physician recommends that you continue on your current medications as directed. Please refer to the Current Medication list given to you today.  *If you need a refill on your cardiac medications before your next appointment, please call your pharmacy*   Lab Work: - none ordered  If you have labs (blood work) drawn today and your tests are completely normal, you will receive your results only by: Marland Kitchen MyChart Message (if you have MyChart) OR . A paper copy in the mail If you have any lab test that is abnormal or we need to change your treatment, we will call you to review the results.   Testing/Procedures: - none ordered   Follow-Up: At [facility], you and your health needs are our priority.  As part of our continuing mission to provide you with exceptional heart care, we have created designated Provider Care Teams.  These Care Teams include your primary Cardiologist (physician) and Advanced Practice Providers (APPs -  Physician Assistants and Nurse Practitioners) who all work together to provide you with the care you need, when you need it.  We recommend signing up for the patient portal called "MyChart".  Sign up information is provided on this After Visit Summary.  MyChart is used to connect with patients for Virtual Visits (Telemedicine).  Patients are able to view lab/test results, encounter notes, upcoming appointments, etc.  Non-urgent messages can be sent to your provider as well.   To learn more about what you can do with MyChart, go to [email or web address].    Your next appointment:   5 month(s)  The format for your next appointment:   In Person  Provider:   Virl Axe, MD   Other Instructions n/a

## 2020-12-22 ENCOUNTER — Other Ambulatory Visit: Payer: Self-pay | Admitting: Family Medicine

## 2020-12-24 ENCOUNTER — Other Ambulatory Visit: Payer: Self-pay | Admitting: Family Medicine

## 2021-01-01 DIAGNOSIS — L570 Actinic keratosis: Secondary | ICD-10-CM | POA: Diagnosis not present

## 2021-01-01 DIAGNOSIS — D2262 Melanocytic nevi of left upper limb, including shoulder: Secondary | ICD-10-CM | POA: Diagnosis not present

## 2021-01-01 DIAGNOSIS — Z86006 Personal history of melanoma in-situ: Secondary | ICD-10-CM | POA: Diagnosis not present

## 2021-01-01 DIAGNOSIS — Z85820 Personal history of malignant melanoma of skin: Secondary | ICD-10-CM | POA: Diagnosis not present

## 2021-01-01 DIAGNOSIS — Z85828 Personal history of other malignant neoplasm of skin: Secondary | ICD-10-CM | POA: Diagnosis not present

## 2021-01-01 DIAGNOSIS — L821 Other seborrheic keratosis: Secondary | ICD-10-CM | POA: Diagnosis not present

## 2021-01-01 DIAGNOSIS — D2271 Melanocytic nevi of right lower limb, including hip: Secondary | ICD-10-CM | POA: Diagnosis not present

## 2021-01-01 DIAGNOSIS — X32XXXA Exposure to sunlight, initial encounter: Secondary | ICD-10-CM | POA: Diagnosis not present

## 2021-01-02 ENCOUNTER — Other Ambulatory Visit: Payer: Self-pay | Admitting: Family Medicine

## 2021-01-02 NOTE — Telephone Encounter (Signed)
Pharmacy requests refill on: Metformin 500 mg   LAST REFILL: 12/24/2020 (Q-60, R-0) LAST OV: 11/28/2019 NEXT OV: Not Scheduled  PHARMACY: [place], [place]

## 2021-01-05 ENCOUNTER — Other Ambulatory Visit: Payer: Self-pay | Admitting: Internal Medicine

## 2021-01-07 ENCOUNTER — Other Ambulatory Visit: Payer: Self-pay | Admitting: Cardiovascular Disease

## 2021-01-17 ENCOUNTER — Other Ambulatory Visit: Payer: Self-pay | Admitting: Family Medicine

## 2021-01-18 ENCOUNTER — Emergency Department
Admission: EM | Admit: 2021-01-18 | Discharge: 2021-01-19 | Disposition: A | Discharge status: Home / Self Care | Payer: Medicare HMO | Attending: Emergency Medicine | Admitting: Emergency Medicine

## 2021-01-18 ENCOUNTER — Other Ambulatory Visit: Payer: Self-pay | Admitting: Family Medicine

## 2021-01-18 ENCOUNTER — Other Ambulatory Visit: Payer: Self-pay

## 2021-01-18 DIAGNOSIS — R69 Illness, unspecified: Secondary | ICD-10-CM | POA: Diagnosis not present

## 2021-01-18 DIAGNOSIS — R110 Nausea: Secondary | ICD-10-CM | POA: Insufficient documentation

## 2021-01-18 DIAGNOSIS — Z7984 Long term (current) use of oral hypoglycemic drugs: Secondary | ICD-10-CM | POA: Diagnosis not present

## 2021-01-18 DIAGNOSIS — J80 Acute respiratory distress syndrome: Secondary | ICD-10-CM | POA: Diagnosis not present

## 2021-01-18 DIAGNOSIS — Z87891 Personal history of nicotine dependence: Secondary | ICD-10-CM | POA: Insufficient documentation

## 2021-01-18 DIAGNOSIS — I130 Hypertensive heart and chronic kidney disease with heart failure and stage 1 through stage 4 chronic kidney disease, or unspecified chronic kidney disease: Secondary | ICD-10-CM | POA: Insufficient documentation

## 2021-01-18 DIAGNOSIS — Z7982 Long term (current) use of aspirin: Secondary | ICD-10-CM | POA: Insufficient documentation

## 2021-01-18 DIAGNOSIS — R1111 Vomiting without nausea: Secondary | ICD-10-CM | POA: Diagnosis not present

## 2021-01-18 DIAGNOSIS — Z96642 Presence of left artificial hip joint: Secondary | ICD-10-CM | POA: Diagnosis not present

## 2021-01-18 DIAGNOSIS — R000 Tachycardia, unspecified: Secondary | ICD-10-CM | POA: Diagnosis not present

## 2021-01-18 DIAGNOSIS — I5022 Chronic systolic (congestive) heart failure: Secondary | ICD-10-CM | POA: Diagnosis not present

## 2021-01-18 DIAGNOSIS — Z96651 Presence of right artificial knee joint: Secondary | ICD-10-CM | POA: Diagnosis not present

## 2021-01-18 DIAGNOSIS — Z79899 Other long term (current) drug therapy: Secondary | ICD-10-CM | POA: Diagnosis not present

## 2021-01-18 DIAGNOSIS — R42 Dizziness and giddiness: Secondary | ICD-10-CM | POA: Diagnosis not present

## 2021-01-18 DIAGNOSIS — R0902 Hypoxemia: Secondary | ICD-10-CM | POA: Diagnosis not present

## 2021-01-18 DIAGNOSIS — Z85828 Personal history of other malignant neoplasm of skin: Secondary | ICD-10-CM | POA: Insufficient documentation

## 2021-01-18 DIAGNOSIS — N183 Chronic kidney disease, stage 3 unspecified: Secondary | ICD-10-CM | POA: Diagnosis not present

## 2021-01-18 DIAGNOSIS — T40711A Poisoning by cannabis, accidental (unintentional), initial encounter: Secondary | ICD-10-CM | POA: Diagnosis present

## 2021-01-18 DIAGNOSIS — T387X5A Adverse effect of androgens and anabolic congeners, initial encounter: Secondary | ICD-10-CM | POA: Diagnosis not present

## 2021-01-18 DIAGNOSIS — T50905A Adverse effect of unspecified drugs, medicaments and biological substances, initial encounter: Secondary | ICD-10-CM

## 2021-01-18 DIAGNOSIS — E1122 Type 2 diabetes mellitus with diabetic chronic kidney disease: Secondary | ICD-10-CM | POA: Diagnosis not present

## 2021-01-18 LAB — CBC
HCT: 35.7 % — ABNORMAL LOW (ref 39.0–52.0)
Hemoglobin: 12.1 g/dL — ABNORMAL LOW (ref 13.0–17.0)
MCH: 30.9 pg (ref 26.0–34.0)
MCHC: 33.9 g/dL (ref 30.0–36.0)
MCV: 91.1 fL (ref 80.0–100.0)
Platelets: 232 10*3/uL (ref 150–400)
RBC: 3.92 MIL/uL — ABNORMAL LOW (ref 4.22–5.81)
RDW: 12.5 % (ref 11.5–15.5)
WBC: 8.6 10*3/uL (ref 4.0–10.5)
nRBC: 0 % (ref 0.0–0.2)

## 2021-01-18 LAB — URINALYSIS, COMPLETE (UACMP) WITH MICROSCOPIC
Bacteria, UA: NONE SEEN
Bilirubin Urine: NEGATIVE
Glucose, UA: >=500 mg/dL — AB
Hgb urine dipstick: NEGATIVE
Ketones, ur: 20 mg/dL — AB
Leukocytes,Ua: NEGATIVE
Nitrite: NEGATIVE
Protein, ur: NEGATIVE mg/dL
Specific Gravity, Urine: 1.021 (ref 1.005–1.030)
pH: 6 (ref 5.0–8.0)

## 2021-01-18 LAB — BASIC METABOLIC PANEL
Anion gap: 9 (ref 5–15)
BUN: 38 mg/dL — ABNORMAL HIGH (ref 8–23)
CO2: 25 mmol/L (ref 22–32)
Calcium: 9 mg/dL (ref 8.9–10.3)
Chloride: 100 mmol/L (ref 98–111)
Creatinine, Ser: 1.17 mg/dL (ref 0.61–1.24)
GFR, Estimated: >60 mL/min (ref >60)
Glucose, Bld: 231 mg/dL — ABNORMAL HIGH (ref 70–99)
Potassium: 4.2 mmol/L (ref 3.5–5.1)
Sodium: 134 mmol/L — ABNORMAL LOW (ref 135–145)

## 2021-01-18 LAB — CBG MONITORING, ED: Glucose-Capillary: 203 mg/dL — ABNORMAL HIGH (ref 70–99)

## 2021-01-18 MED ORDER — SODIUM CHLORIDE 0.9 % IV BOLUS
500.0000 mL | Freq: Once | INTRAVENOUS | Status: AC
  Administered 2021-01-18: 500 mL via INTRAVENOUS

## 2021-01-18 NOTE — ED Triage Notes (Signed)
Type 2 DM. Reports BGL 220's at home with associated dizziness. Denies chest pain or SOB. Denies syncope or fall. Denies numbness or tingling. Pt alert and following commands appropriately. Breathing unlabored with symmetric chest rise and fall and speaking in full sentences.

## 2021-01-18 NOTE — Discharge Instructions (Addendum)
I would recommend stopping and avoiding any Delta 8 or other THC supplements.  Dispose of the supply/sample that you took today.  Your labs, urine were reassuring today.

## 2021-01-18 NOTE — ED Provider Notes (Signed)
[facility] Emergency Department Provider Note  ____________________________________________   Event Date/Time   First MD Initiated Contact with Patient 01/18/21 2204     (approximate)  I have reviewed the triage vital signs and the nursing notes.   HISTORY  Chief Complaint Dizziness and Hyperglycemia    HPI Tim Mccullough is a 70 y.o. male  Here with nausea, dry mouth, lightheadedness. Pt states that his son was making delta 8 concentrate today. He was cleaning dishes when he tried a small amount of the concentrate. He states that this was around 3-4 PM. He feels slightly anxious, with intermittent nausea. No vomiting. No CP. No palpitations. H/o "irregular heart rhythm" In the past but this resolved with ablation. No other complaints. No other ingestants.        Past Medical History:  Diagnosis Date  . Abnormal echocardiogram 2/11   Repeated after med mgmt of cardiomyopathy showed EF 40-45%, mildly dilated LV, mild LV hypertrophy, anterolateral and apical hypokinesis, mild diastolic dysfunction.  . Anemia    hx  . Arthritis   . Blood transfusion without reported diagnosis 09/2012  . BPH (benign prostatic hypertrophy) 2004  . Cancer ([place])    melanoma back  . Cardiomyopathy    ? tachycardia-mediated  . Depression   . Diabetes mellitus 11/2006   Type II  . Diverticulosis   . Echocardiogram abnormal 7/10   Mild LVH with a mildly dilated LV.  EF was difficult to assess given poor acoustic windows but appeared moderately decreased.  Mild MR.  Probable small perimembranous VSD, mild LAE  . Esophageal erosions   . GERD (gastroesophageal reflux disease)   . H/O cardiac radiofrequency ablation   . Hip pain   . History of MRI 7/10   Cardiac MRI was done to followup difficult echo.  This showed mild to moderately dilated LV, mild to moderate LAE, EF  33% with global hypokinesis, normal RV size with mild systolic dysfunction.  There was no large VSD  evident.  There was no myocardial delayed enhancement so no evidence for infiltrative disease or infarction.  LHC (7/10) showed only luminal irreg. in the coronaries and EF 35%  . Holter monitor, abnormal 7/10   Frequent PAC's and PVC's  Average HR 96  (but he had started Toprol XL).  HR range 71-130  . Hyperlipidemia 04/1998  . Hypertension 1995  . Obesity   . OSA (obstructive sleep apnea)    Not using CPAP- improved with weight loss  . Pneumonia    HX OF pna  . Sinus tachycardia    Pt says he has been told his heart rate is high since he was in high school.   . Sleep apnea    has cpap; but does not use  . VSD (ventricular septal defect)    Probable small perimembranous    Patient Active Problem List   Diagnosis Date Noted  . H/O cardiac radiofrequency ablation   . PVC (premature ventricular contraction) 03/18/2020  . Wide-complex tachycardia ([place]) 03/01/2020  . NICM (nonischemic cardiomyopathy) ([place]) 02/15/2020  . Orthostatic hypotension 02/15/2020  . Palpitations   . Hypomagnesemia   . Near syncope 01/29/2020  . Type 2 diabetes mellitus with stage 3 chronic kidney disease ([place]) 01/29/2020  . Taste disorder 04/30/2019  . Bradycardia 04/30/2019  . Tinea cruris 10/23/2018  . Intertrigo 07/20/2018  . Hand paresthesia 07/20/2018  . Healthcare maintenance 04/17/2018  . Back pain 04/17/2018  . Hand pain, left 08/21/2015  .  Abdominal wall hernia 08/21/2015  . OA (osteoarthritis) 02/18/2015  . Advance care planning 08/19/2014  . Medicare annual wellness visit, subsequent 07/30/2013  . Barrett's esophagus 01/17/2013  . Preop cardiovascular exam 03/15/2012  . Chronic systolic CHF (congestive heart failure) ([place]) 03/15/2012  . Vertigo 02/09/2012  . GERD 11/24/2010  . VSD 05/23/2009  . UNSPECIFIED TACHYCARDIA 04/22/2009  . OBESITY 12/17/2008  . Diabetes mellitus without complication ([place]) 29/79/8921  . Hyperlipidemia associated with type 2 diabetes mellitus ([place]) 04/17/2007   . Essential hypertension 04/17/2007  . BENIGN PROSTATIC HYPERTROPHY 04/17/2007  . ANXIETY DEPRESSION 04/15/2007  . ERECTILE DYSFUNCTION 04/15/2007  . Sleep apnea 04/15/2007  . HX, PERSONAL, TOBACCO USE 04/15/2007    Past Surgical History:  Procedure Laterality Date  . CARDIAC CATHETERIZATION  05/30/2009   Dilated cardiomyopathy.  min VSD.  Hypokin Inf  wall.  Multiple min luminal Irr.  EF 35%  . COLONOSCOPY W/ BIOPSIES  08/24/2006   Divertics, polyps x3, bx negative, repeat 2012 with mild diverticulosis in teh sigmoid colon and 1 polyp in tranverse colon, repeat due 2017  . ESOPHAGOGASTRODUODENOSCOPY  2012   1)Barrett's esoph, 2) Erosive esophagitis, 3) Mild gastritis in the antrum, 4) Duodenitis in the bulb of duodenum, 5) Small hiatal hernia  . INGUINAL HERNIA REPAIR  1985   Left  . INGUINAL HERNIA REPAIR  1999   Right  . KNEE ARTHROSCOPY  10/15/2011   Procedure: ARTHROSCOPY KNEE;  Surgeon: Ninetta Lights, MD;  Location: [facility];  Service: Orthopedics;  Laterality: Right;  right knee scope with lateral meniscectomy, removal loose foreign body, and microfracture technique  . LAMINECTOMY  1987   with discectomy  . PVC ABLATION N/A 03/18/2020   Procedure: PVC ABLATION;  Surgeon: Evans Lance, MD;  Location: [facility];  Service: Cardiovascular;  Laterality: N/A;  . RIGHT/LEFT HEART CATH AND CORONARY ANGIOGRAPHY N/A 12/25/2019   Procedure: RIGHT/LEFT HEART CATH AND CORONARY ANGIOGRAPHY;  Surgeon: Wellington Hampshire, MD;  Location: [facility];  Service: Cardiovascular;  Laterality: N/A;  . ROTATOR CUFF REPAIR  2/11   rt  . TOTAL HIP ARTHROPLASTY  2011   Left  . TOTAL KNEE ARTHROPLASTY  06/01/2012   Procedure: TOTAL KNEE ARTHROPLASTY;  Surgeon: Ninetta Lights, MD;  Location: [facility];  Service: Orthopedics;  Laterality: Right;    Prior to Admission medications   Medication Sig Start Date End Date Taking? Authorizing Provider  aspirin 81 MG tablet  Take 81 mg by mouth daily.     [provider]  atorvastatin (LIPITOR) 40 MG tablet TAKE 1 TABLET BY MOUTH EVERY DAY 01/17/21   Tonia Ghent, MD  carvedilol (COREG) 12.5 MG tablet Take 1 tablet (12.5 mg total) by mouth 2 (two) times daily. 07/16/20 10/14/20  Deboraha Sprang, MD  doxazosin (CARDURA) 1 MG tablet TAKE 1 TABLET BY MOUTH EVERY DAY 01/17/21   Tonia Ghent, MD  fish oil-omega-3 fatty acids 1000 MG capsule Take 1 g by mouth 2 (two) times daily.     [provider]  losartan (COZAAR) 25 MG tablet TAKE 1 TABLET (25 MG) BY MOUTH ONCE DAILY AT BEDTIME 09/30/20   Deboraha Sprang, MD  magnesium oxide (MAG-OX) 400 MG tablet TAKE 1 TABLET BY MOUTH TWICE A DAY 01/06/21   Deboraha Sprang, MD  metFORMIN (GLUCOPHAGE) 500 MG tablet TAKE 2 TABLETS (1,000 MG TOTAL) BY MOUTH 2 (TWO) TIMES DAILY WITH A MEAL. 12/24/20   Elsie Stain  S, MD  omeprazole (PRILOSEC) 20 MG capsule Take 1 capsule (20 mg total) by mouth daily. 09/28/19   Ladene Artist, MD  ONE TOUCH ULTRA TEST test strip CHECK BLOOD SUGAR DAILY AND IF BLOOD SUGAR FLUCTUATES. 250.00 01/02/15   Tonia Ghent, MD  PARoxetine (PAXIL) 20 MG tablet TAKE 1 TABLET BY MOUTH EVERY DAY 12/24/20   Tonia Ghent, MD  spironolactone (ALDACTONE) 25 MG tablet TAKE 1/2 TABLET BY MOUTH EVERY DAY 01/07/21   Deboraha Sprang, MD    Allergies Patient has no known allergies.  Family History  Problem Relation Age of Onset  . Heart disease Father        CABG  . Stroke Father   . Diabetes Father   . Hypertension Mother   . Alzheimer's disease Mother   . Diabetes Sister   . Hypertension Sister   . Cancer Sister        uterine cancer  . Diabetes Brother   . Throat cancer Brother        Throat  . Esophageal cancer Brother   . Diabetes Sister   . Hypertension Sister   . Throat cancer Other        Throat  . Diabetes Other   . Alcohol abuse Other   . Diabetes Maternal Grandmother        Insulin  . Colon cancer Cousin   .  Esophageal cancer Maternal Grandfather   . Drug abuse Neg Hx   . Prostate cancer Neg Hx   . Rectal cancer Neg Hx     Social History Social History   Tobacco Use  . Smoking status: Former Smoker    Years: 20.00    Types: Cigarettes    Quit date: 11/09/1998    Years since quitting: 22.2  . Smokeless tobacco: Former Systems developer    Quit date: 06/03/2007  . Tobacco comment: no alcohol since nov 12  Vaping Use  . Vaping Use: Never used  Substance Use Topics  . Alcohol use: No    Alcohol/week: 0.0 standard drinks    Comment: sober as 09/2011  . Drug use: No    Review of Systems  Review of Systems  Constitutional: Positive for fatigue. Negative for chills and fever.  HENT: Negative for sore throat.   Respiratory: Negative for shortness of breath.   Cardiovascular: Negative for chest pain.  Gastrointestinal: Negative for abdominal pain.  Genitourinary: Negative for flank pain.  Musculoskeletal: Negative for neck pain.  Skin: Negative for rash and wound.  Allergic/Immunologic: Negative for immunocompromised state.  Neurological: Negative for weakness and numbness.  Hematological: Does not bruise/bleed easily.  Psychiatric/Behavioral: The patient is nervous/anxious.   All other systems reviewed and are negative.    ____________________________________________  PHYSICAL EXAM:      VITAL SIGNS: ED Triage Vitals  Enc Vitals Group     BP 01/18/21 2120 124/82     Pulse Rate 01/18/21 2120 (!) 101     Resp 01/18/21 2120 18     Temp 01/18/21 2120 97.9 F (36.6 C)     Temp Source 01/18/21 2120 Oral     SpO2 01/18/21 2120 93 %     Weight 01/18/21 2115 215 lb (97.5 kg)     Height 01/18/21 2115 5\' 10"  (1.778 m)     Head Circumference --      Peak Flow --      Pain Score 01/18/21 2115 0     Pain Loc --  Pain Edu? --      Excl. in [place]? --      Physical Exam Vitals and nursing note reviewed.  Constitutional:      General: He is not in acute distress.    Appearance: He is  well-developed.  HENT:     Head: Normocephalic and atraumatic.     Mouth/Throat:     Mouth: Mucous membranes are dry.  Eyes:     Conjunctiva/sclera: Conjunctivae normal.  Cardiovascular:     Rate and Rhythm: Regular rhythm. Tachycardia present.     Heart sounds: Normal heart sounds.  Pulmonary:     Effort: Pulmonary effort is normal. No respiratory distress.     Breath sounds: No wheezing.  Abdominal:     General: There is no distension.  Musculoskeletal:     Cervical back: Neck supple.  Skin:    General: Skin is warm.     Capillary Refill: Capillary refill takes less than 2 seconds.     Findings: No rash.  Neurological:     Mental Status: He is alert and oriented to person, place, and time.     Motor: No abnormal muscle tone.       ____________________________________________   LABS (all labs ordered are listed, but only abnormal results are displayed)  Labs Reviewed  BASIC METABOLIC PANEL - Abnormal; Notable for the following components:      Result Value   Sodium 134 (*)    Glucose, Bld 231 (*)    BUN 38 (*)    All other components within normal limits  CBC - Abnormal; Notable for the following components:   RBC 3.92 (*)    Hemoglobin 12.1 (*)    HCT 35.7 (*)    All other components within normal limits  URINALYSIS, COMPLETE (UACMP) WITH MICROSCOPIC - Abnormal; Notable for the following components:   Color, Urine YELLOW (*)    APPearance CLEAR (*)    Glucose, UA >=500 (*)    Ketones, ur 20 (*)    All other components within normal limits  CBG MONITORING, ED - Abnormal; Notable for the following components:   Glucose-Capillary 203 (*)    All other components within normal limits  CBG MONITORING, ED    ____________________________________________  EKG: Normal sinus rhythm, VR 98. PR 166, QRS 156, QTc 515. RBBB. No acute St elevations or depressions. No ischemia or infarct. ________________________________________  RADIOLOGY All imaging, including plain  films, CT scans, and ultrasounds, independently reviewed by me, and interpretations confirmed via formal radiology reads.  ED MD interpretation:     Official radiology report(s): No results found.  ____________________________________________  PROCEDURES   Procedure(s) performed (including Critical Care):  Procedures  ____________________________________________  INITIAL IMPRESSION / MDM / [place] / ED COURSE  As part of my medical decision making, I reviewed the following data within the [place] notes reviewed and incorporated, Old chart reviewed, Notes from prior ED visits, and [place] Controlled Substance [place] was evaluated in Emergency Department on 01/18/2021 for the symptoms described in the history of present illness. He was evaluated in the context of the global COVID-19 pandemic, which necessitated consideration that the patient might be at risk for infection with the SARS-CoV-2 virus that causes COVID-19. Institutional protocols and algorithms that pertain to the evaluation of patients at risk for COVID-19 are in a state of rapid change based on information released by regulatory bodies including the CDC and  federal and state organizations. These policies and algorithms were followed during the patient's care in the ED.  Some ED evaluations and interventions may be delayed as a result of limited staffing during the pandemic.*     Medical Decision Making:  70 yo M here with nausea, anxiety in setting of Delta 8 THC use. Pt is overall well appearing on exam, slightly intoxicated. No focal neuro deficits. Denies any CP. H/o arrhythmia per report but EKG is normal here, and no arrhythmia noted on telemetry. Labs reviewed - pt hyperglycemic but gap normal, no signs of DKA. UA with mild dehydration. Will give fluids (500 cc 2/2 h/o CHF, EF 35%), monitor, ambulate in ED. If clinically sober and improving, can likely go home  with supportive care. He was instructed to avoid delta 8 in the future and to throw out the supply that he used prior to presentation, in the event of contamination.  ____________________________________________  FINAL CLINICAL IMPRESSION(S) / ED DIAGNOSES  Final diagnoses:  Adverse effect of drug, initial encounter     MEDICATIONS GIVEN DURING THIS VISIT:  Medications  sodium chloride 0.9 % bolus 500 mL (500 mLs Intravenous New Bag/Given 01/18/21 2249)     ED Discharge Orders    None       Note:  This document was prepared using Dragon voice recognition software and may include unintentional dictation errors.   Duffy Bruce, MD 01/18/21 2329

## 2021-01-19 DIAGNOSIS — T40711A Poisoning by cannabis, accidental (unintentional), initial encounter: Secondary | ICD-10-CM | POA: Diagnosis not present

## 2021-01-19 LAB — CBG MONITORING, ED: Glucose-Capillary: 202 mg/dL — ABNORMAL HIGH (ref 70–99)

## 2021-01-19 NOTE — ED Notes (Addendum)
Patient ambulated to bathroom.

## 2021-01-19 NOTE — ED Notes (Signed)
Gave patient fluids and nutrition per order.

## 2021-01-19 NOTE — ED Provider Notes (Signed)
12:00 AM  Assumed care.  Patient here with accidental THC overdose.  Patient will need to be monitored but has a safe ride home.  Hemodynamically stable.  Labs, urine unremarkable.  1:30 AM  Pt reports feeling better.  Able to tolerate p.o. and ambulate without difficulty.  States he still has some vertigo but walks with a steady gait.  States he feels well enough to go home.  Will discharge with sober driver.  At this time, I do not feel there is any life-threatening condition present. I have reviewed, interpreted and discussed all results (EKG, imaging, lab, urine as appropriate) and exam findings with patient/family. I have reviewed nursing notes and appropriate previous records.  I feel the patient is safe to be discharged home without further emergent workup and can continue workup as an outpatient as needed. Discussed usual and customary return precautions. Patient/family verbalize understanding and are comfortable with this plan.  Outpatient follow-up has been provided as needed. All questions have been answered.    Asmi Fugere, Delice Bison, DO 01/19/21 [phone]

## 2021-01-20 NOTE — Telephone Encounter (Signed)
Patient last seen 01/21; patient made appt for 02/10/21. Okay to refill?

## 2021-01-21 NOTE — Telephone Encounter (Signed)
Yes, sent. Thanks.  ?

## 2021-01-26 ENCOUNTER — Other Ambulatory Visit: Payer: Self-pay | Admitting: Internal Medicine

## 2021-01-26 ENCOUNTER — Other Ambulatory Visit: Payer: Self-pay | Admitting: Family Medicine

## 2021-01-26 DIAGNOSIS — E119 Type 2 diabetes mellitus without complications: Secondary | ICD-10-CM

## 2021-02-03 ENCOUNTER — Other Ambulatory Visit: Payer: PRIVATE HEALTH INSURANCE

## 2021-02-10 ENCOUNTER — Encounter: Payer: PRIVATE HEALTH INSURANCE | Admitting: Family Medicine

## 2021-02-13 ENCOUNTER — Other Ambulatory Visit: Payer: Self-pay | Admitting: Family Medicine

## 2021-02-15 ENCOUNTER — Other Ambulatory Visit: Payer: Self-pay | Admitting: Family Medicine

## 2021-02-15 ENCOUNTER — Other Ambulatory Visit: Payer: Self-pay | Admitting: Internal Medicine

## 2021-02-21 ENCOUNTER — Other Ambulatory Visit: Payer: Self-pay | Admitting: Family Medicine

## 2021-02-25 ENCOUNTER — Other Ambulatory Visit: Payer: Self-pay

## 2021-02-25 ENCOUNTER — Other Ambulatory Visit (INDEPENDENT_AMBULATORY_CARE_PROVIDER_SITE_OTHER): Payer: Medicare HMO

## 2021-02-25 DIAGNOSIS — E119 Type 2 diabetes mellitus without complications: Secondary | ICD-10-CM | POA: Diagnosis not present

## 2021-02-25 LAB — COMPREHENSIVE METABOLIC PANEL WITH GFR
ALT: 15 U/L (ref 0–53)
AST: 14 U/L (ref 0–37)
Albumin: 4 g/dL (ref 3.5–5.2)
Alkaline Phosphatase: 75 U/L (ref 39–117)
BUN: 27 mg/dL — ABNORMAL HIGH (ref 6–23)
CO2: 30 meq/L (ref 19–32)
Calcium: 9.3 mg/dL (ref 8.4–10.5)
Chloride: 99 meq/L (ref 96–112)
Creatinine, Ser: 1.03 mg/dL (ref 0.40–1.50)
GFR: 74.3 mL/min
Glucose, Bld: 177 mg/dL — ABNORMAL HIGH (ref 70–99)
Potassium: 4.5 meq/L (ref 3.5–5.1)
Sodium: 136 meq/L (ref 135–145)
Total Bilirubin: 0.5 mg/dL (ref 0.2–1.2)
Total Protein: 6.6 g/dL (ref 6.0–8.3)

## 2021-02-25 LAB — CBC WITH DIFFERENTIAL/PLATELET
Basophils Absolute: 0 K/uL (ref 0.0–0.1)
Basophils Relative: 0.4 % (ref 0.0–3.0)
Eosinophils Absolute: 0.1 K/uL (ref 0.0–0.7)
Eosinophils Relative: 1.8 % (ref 0.0–5.0)
HCT: 40.3 % (ref 39.0–52.0)
Hemoglobin: 13.7 g/dL (ref 13.0–17.0)
Lymphocytes Relative: 36.8 % (ref 12.0–46.0)
Lymphs Abs: 2.5 K/uL (ref 0.7–4.0)
MCHC: 34.1 g/dL (ref 30.0–36.0)
MCV: 91.9 fl (ref 78.0–100.0)
Monocytes Absolute: 0.6 K/uL (ref 0.1–1.0)
Monocytes Relative: 9.1 % (ref 3.0–12.0)
Neutro Abs: 3.5 K/uL (ref 1.4–7.7)
Neutrophils Relative %: 51.9 % (ref 43.0–77.0)
Platelets: 224 K/uL (ref 150.0–400.0)
RBC: 4.38 Mil/uL (ref 4.22–5.81)
RDW: 13 % (ref 11.5–15.5)
WBC: 6.9 K/uL (ref 4.0–10.5)

## 2021-02-25 LAB — LIPID PANEL
Cholesterol: 132 mg/dL (ref 0–200)
HDL: 49.1 mg/dL
LDL Cholesterol: 53 mg/dL (ref 0–99)
NonHDL: 83
Total CHOL/HDL Ratio: 3
Triglycerides: 150 mg/dL — ABNORMAL HIGH (ref 0.0–149.0)
VLDL: 30 mg/dL (ref 0.0–40.0)

## 2021-02-25 LAB — HEMOGLOBIN A1C: Hgb A1c MFr Bld: 7.6 % — ABNORMAL HIGH (ref 4.6–6.5)

## 2021-03-03 ENCOUNTER — Other Ambulatory Visit: Payer: Self-pay

## 2021-03-03 ENCOUNTER — Ambulatory Visit (INDEPENDENT_AMBULATORY_CARE_PROVIDER_SITE_OTHER): Payer: PRIVATE HEALTH INSURANCE | Admitting: Family Medicine

## 2021-03-03 ENCOUNTER — Encounter: Payer: Self-pay | Admitting: Family Medicine

## 2021-03-03 VITALS — BP 110/72 | HR 84 | Temp 97.8°F | Ht 70.0 in | Wt 221.0 lb

## 2021-03-03 DIAGNOSIS — R202 Paresthesia of skin: Secondary | ICD-10-CM

## 2021-03-03 DIAGNOSIS — E1169 Type 2 diabetes mellitus with other specified complication: Secondary | ICD-10-CM

## 2021-03-03 DIAGNOSIS — Z7189 Other specified counseling: Secondary | ICD-10-CM

## 2021-03-03 DIAGNOSIS — E119 Type 2 diabetes mellitus without complications: Secondary | ICD-10-CM

## 2021-03-03 DIAGNOSIS — F341 Dysthymic disorder: Secondary | ICD-10-CM

## 2021-03-03 DIAGNOSIS — Z0000 Encounter for general adult medical examination without abnormal findings: Secondary | ICD-10-CM

## 2021-03-03 DIAGNOSIS — W57XXXA Bitten or stung by nonvenomous insect and other nonvenomous arthropods, initial encounter: Secondary | ICD-10-CM

## 2021-03-03 DIAGNOSIS — E785 Hyperlipidemia, unspecified: Secondary | ICD-10-CM

## 2021-03-03 DIAGNOSIS — I10 Essential (primary) hypertension: Secondary | ICD-10-CM

## 2021-03-03 DIAGNOSIS — S90861A Insect bite (nonvenomous), right foot, initial encounter: Secondary | ICD-10-CM

## 2021-03-03 MED ORDER — DOXAZOSIN MESYLATE 1 MG PO TABS: 1.0000 mg | ORAL_TABLET | Freq: Every day | ORAL | 3 refills | Status: DC

## 2021-03-03 MED ORDER — GLUCOSE BLOOD VI STRP: ORAL_STRIP | 12 refills | Status: DC

## 2021-03-03 MED ORDER — ATORVASTATIN CALCIUM 40 MG PO TABS: 1.0000 | ORAL_TABLET | Freq: Every day | ORAL | 3 refills | Status: DC

## 2021-03-03 MED ORDER — PAROXETINE HCL 20 MG PO TABS: 20.0000 mg | ORAL_TABLET | Freq: Every day | ORAL | 3 refills | Status: DC

## 2021-03-03 MED ORDER — METFORMIN HCL 500 MG PO TABS: 1000.0000 mg | ORAL_TABLET | Freq: Two times a day (BID) | ORAL | 3 refills | Status: DC

## 2021-03-03 NOTE — Patient Instructions (Addendum)
I would skip atorvastatin for 1 week and see if you feel a lot better.  Either way, let me know.  If not better, then restart it.  Take care.  Glad to see you.  Recheck A1c in about 4 months at an office visit.   Get an OTC wrist brace and use it at night.    If you have fevers or spreading redness on the ankle/foot, let me know.

## 2021-03-03 NOTE — Progress Notes (Signed)
This visit occurred during the SARS-CoV-2 public health emergency.  Safety protocols were in place, including screening questions prior to the visit, additional usage of staff PPE, and extensive cleaning of exam room while observing appropriate contact time as indicated for disinfecting solutions.  I have personally reviewed the Medicare Annual Wellness questionnaire and have noted 1. The patient's medical and social history 2. Their use of alcohol, tobacco or illicit drugs 3. Their current medications and supplements 4. The patient's functional ability including ADL's, fall risks, home safety risks and hearing or visual             impairment. 5. Diet and physical activities 6. Evidence for depression or mood disorders  The patients weight, height, BMI have been recorded in the chart and visual acuity is per eye clinic.  I have made referrals, counseling and provided education to the patient based review of the above and I have provided the pt with a written personalized care plan for preventive services.  Provider list updated- see scanned forms.  Routine anticipatory guidance given to patient.  See health maintenance. The possibility exists that previously documented standard health maintenance information may have been brought forward from a previous encounter into this note.  If needed, that same information has been updated to reflect the current situation based on today's encounter.    Flu previously done Shingles discussed. PNA up-to-date Tetanus 2014 COVID-vaccine previously done Colon cancer screening done with colonoscopy 2017. Prostate cancer screening and PSA options (with potential risks and benefits of testing vs not testing) were discussed along with recent recs/guidelines.  He declined testing PSA at this point. Advance directive-daughter designated if patient were incapacitated. Cognitive function addressed- see scanned forms- and if abnormal then additional documentation  follows.   Tick bite on R foot, removed 02/08/21.  It wasn't engorged.  No fevers.  Locally irritated.    Hypertension:    Using medication without problems or lightheadedness: yes- minimally lightheaded on standing- cautions d/w pt  Chest pain with exertion:no Edema:no Short of breath:no  S/p ablation and he clearly feels better.    Diabetes:  Using medications without difficulties: yes Hypoglycemic episodes:only if prolonged fasting- rare Hyperglycemic episodes:no Feet problems: minimal occ burning.   Blood Sugars averaging:145 this AM, typical for patient.  eye exam within last year: yes  Elevated Cholesterol: Using medications without problems: see below.   Muscle aches: he has aches, unclear if from statin.   Diet compliance: yes Exercise: yes  Mood is good on paxil.  No ADE on med.  Previous ER evaluation discussed with patient.  Discussed avoidance of medications that could alter his mood.  Left-handed.  Has paresthesia in the right hand.  No weakness.  Has not tried bracing yet.  PMH and SH reviewed  Meds, vitals, and allergies reviewed.   ROS: Per HPI.  Unless specifically indicated otherwise in HPI, the patient denies:  General: fever. Eyes: acute vision changes ENT: sore throat Cardiovascular: chest pain Respiratory: SOB GI: vomiting GU: dysuria Musculoskeletal: acute back pain Derm: acute rash Neuro: acute motor dysfunction Psych: worsening mood Endocrine: polydipsia Heme: bleeding Allergy: hayfever  GEN: nad, alert and oriented HEENT: ncat NECK: supple w/o LA CV: rrr. PULM: ctab, no inc wob ABD: soft, +bs EXT: no edema SKIN: no acute rash other than minimal irritation at tick bite site on the lateral side of the right foot. Right hand paresthesia at baseline with tingling on persistent wrist flexion.  No weakness.  Diabetic foot exam: Normal  inspection No skin breakdown No calluses  Normal DP pulses Normal sensation to light touch and  monofilament Nails normal

## 2021-03-05 DIAGNOSIS — W57XXXA Bitten or stung by nonvenomous insect and other nonvenomous arthropods, initial encounter: Secondary | ICD-10-CM | POA: Insufficient documentation

## 2021-03-05 DIAGNOSIS — E119 Type 2 diabetes mellitus without complications: Secondary | ICD-10-CM | POA: Diagnosis not present

## 2021-03-05 DIAGNOSIS — I10 Essential (primary) hypertension: Secondary | ICD-10-CM | POA: Diagnosis not present

## 2021-03-05 DIAGNOSIS — Z9849 Cataract extraction status, unspecified eye: Secondary | ICD-10-CM | POA: Diagnosis not present

## 2021-03-05 DIAGNOSIS — H5213 Myopia, bilateral: Secondary | ICD-10-CM | POA: Diagnosis not present

## 2021-03-05 DIAGNOSIS — H35033 Hypertensive retinopathy, bilateral: Secondary | ICD-10-CM | POA: Diagnosis not present

## 2021-03-05 DIAGNOSIS — H52221 Regular astigmatism, right eye: Secondary | ICD-10-CM | POA: Diagnosis not present

## 2021-03-05 DIAGNOSIS — Z961 Presence of intraocular lens: Secondary | ICD-10-CM | POA: Diagnosis not present

## 2021-03-05 DIAGNOSIS — H524 Presbyopia: Secondary | ICD-10-CM | POA: Diagnosis not present

## 2021-03-05 LAB — HM DIABETES EYE EXAM

## 2021-03-05 NOTE — Assessment & Plan Note (Signed)
Reasonable to try nocturnal wrist brace and then update me as needed.  He agrees.

## 2021-03-05 NOTE — Assessment & Plan Note (Signed)
Status post ablation and he clearly feels better.  Continue carvedilol doxazosin losartan and spironolactone.

## 2021-03-05 NOTE — Assessment & Plan Note (Signed)
Mood is good on Paxil.  Continue as is.  He agrees to plan.

## 2021-03-05 NOTE — Assessment & Plan Note (Signed)
Continue work on diet and exercise.  Continue metformin.  Labs discussed with patient.  He agrees with plan.

## 2021-03-05 NOTE — Assessment & Plan Note (Signed)
Advance directive- daughter designated if patient were incapacitated.   

## 2021-03-05 NOTE — Assessment & Plan Note (Signed)
See after visit summary.  Hold atorvastatin for 1 week.  If he clearly feels better then he will let me know.  If he does not feel any better then he can restart it.  Rationale discussed with patient.  He agrees.

## 2021-03-05 NOTE — Assessment & Plan Note (Signed)
Should not have systemic symptoms but if he has any fevers redness or other problems he will let me know.

## 2021-03-05 NOTE — Assessment & Plan Note (Signed)
Flu previously done Shingles discussed. PNA up-to-date Tetanus 2014 COVID-vaccine previously done Colon cancer screening done with colonoscopy 2017. Prostate cancer screening and PSA options (with potential risks and benefits of testing vs not testing) were discussed along with recent recs/guidelines.  He declined testing PSA at this point. Advance directive-daughter designated if patient were incapacitated. Cognitive function addressed- see scanned forms- and if abnormal then additional documentation follows.

## 2021-04-22 ENCOUNTER — Encounter: Payer: Self-pay | Admitting: Internal Medicine

## 2021-04-22 ENCOUNTER — Other Ambulatory Visit: Payer: Self-pay

## 2021-04-22 ENCOUNTER — Ambulatory Visit (INDEPENDENT_AMBULATORY_CARE_PROVIDER_SITE_OTHER): Payer: Medicare HMO | Admitting: Internal Medicine

## 2021-04-22 VITALS — BP 100/68 | HR 76 | Ht 70.0 in | Wt 220.0 lb

## 2021-04-22 DIAGNOSIS — I472 Ventricular tachycardia: Secondary | ICD-10-CM | POA: Diagnosis not present

## 2021-04-22 DIAGNOSIS — I493 Ventricular premature depolarization: Secondary | ICD-10-CM

## 2021-04-22 DIAGNOSIS — R000 Tachycardia, unspecified: Secondary | ICD-10-CM

## 2021-04-22 DIAGNOSIS — I5042 Chronic combined systolic (congestive) and diastolic (congestive) heart failure: Secondary | ICD-10-CM | POA: Diagnosis not present

## 2021-04-22 DIAGNOSIS — I428 Other cardiomyopathies: Secondary | ICD-10-CM

## 2021-04-22 NOTE — Progress Notes (Signed)
Patient ID: Tim Mccullough, male   DOB: [birth year], 70 y.o.   MRN: [id number]       Patient Care Team: Tonia Ghent, MD as PCP - General (Family Medicine) Wellington Hampshire, MD as PCP - Cardiology (Cardiology)   HPI  Tim Mccullough is a 70 y.o. male seen in  for wide complex beats in a pattern of bigeminy in the setting of nonischemic cardiomyopathy initially diagnosed in 2010.  These records are not available but the old chart reports ejection fraction of 35% and no obstructive coronary disease.  It also mentions a hemodynamically insignificant VSD.  He was started on flecainide. Without improvement dronaderone was not thought to be cost affordable.   5/21 underwent EP testing and attempted ablation of an intramural/septal focus.  PVCs could be suppressed but reemerged following cessation of energy.  He was started on amiodarone 6/21 Referred to Dr Noralee Stain.  Ablation 10/21.  Amiodarone was discontinued.  No recurrent ectopy.   He has been treated with beta-blockers and Entresto, 2/2 cost >> losartan   The patient denies chest pain, nocturnal dyspnea, orthopnea or peripheral edema.  There have been no palpitations, lightheadedness or syncope.  Complains of shortness of breath climbing up following fishing.  Less that it was a year ago.  Ran out of magnesium notes no change.Marland Kitchen    DATE TEST EF    5/13 Echo   50-55 %    1/21 Echo   30-35 %    2/21 LHC   Cors - normal  11/21 Echo  30-35%     Date Cr K Mg TSH LFTs Hgb  2/21 1.15 5.1    13.8  3/21  1.32 4.7 1.9<<1.3      6/21    0.882 30   10/21 0.9 3.9 1.6<<1.4     4/22 1.03 4.5         Date PVCs  Multiple ECGS Bigeminy   8/21 3%      Past Medical History:  Diagnosis Date   Abnormal echocardiogram 2/11   Repeated after med mgmt of cardiomyopathy showed EF 40-45%, mildly dilated LV, mild LV hypertrophy, anterolateral and apical hypokinesis, mild diastolic dysfunction.   Anemia    hx   Arthritis    Blood  transfusion without reported diagnosis 09/2012   BPH (benign prostatic hypertrophy) 2004   Cancer [facility])    melanoma back   Cardiomyopathy    ? tachycardia-mediated   Depression    Diabetes mellitus 11/2006   Type II   Diverticulosis    Echocardiogram abnormal 7/10   Mild LVH with a mildly dilated LV.  EF was difficult to assess given poor acoustic windows but appeared moderately decreased.  Mild MR.  Probable small perimembranous VSD, mild LAE   Esophageal erosions    GERD (gastroesophageal reflux disease)    H/O cardiac radiofrequency ablation    Hip pain    History of MRI 7/10   Cardiac MRI was done to followup difficult echo.  This showed mild to moderately dilated LV, mild to moderate LAE, EF  33% with global hypokinesis, normal RV size with mild systolic dysfunction.  There was no large VSD evident.  There was no myocardial delayed enhancement so no evidence for infiltrative disease or infarction.  LHC (7/10) showed only luminal irreg. in the coronaries and EF 35%   Holter monitor, abnormal 7/10   Frequent PAC's and PVC's  Average HR 96  (but he had started Toprol XL).  HR  range 71-130   Hyperlipidemia 04/1998   Hypertension 1995   Obesity    OSA (obstructive sleep apnea)    Not using CPAP- improved with weight loss   Pneumonia    HX OF pna   Sinus tachycardia    Pt says he has been told his heart rate is high since he was in high school.    Sleep apnea    has cpap; but does not use   VSD (ventricular septal defect)    Probable small perimembranous    Past Surgical History:  Procedure Laterality Date   CARDIAC CATHETERIZATION  05/30/2009   Dilated cardiomyopathy.  min VSD.  Hypokin Inf  wall.  Multiple min luminal Irr.  EF 35%   CATARACT EXTRACTION, BILATERAL     COLONOSCOPY W/ BIOPSIES  08/24/2006   Divertics, polyps x3, bx negative, repeat 2012 with mild diverticulosis in teh sigmoid colon and 1 polyp in tranverse colon, repeat due 2017   ESOPHAGOGASTRODUODENOSCOPY   2012   1)Barrett's esoph, 2) Erosive esophagitis, 3) Mild gastritis in the antrum, 4) Duodenitis in the bulb of duodenum, 5) Small hiatal hernia   INGUINAL HERNIA REPAIR  1985   Left   INGUINAL HERNIA REPAIR  1999   Right   KNEE ARTHROSCOPY  10/15/2011   Procedure: ARTHROSCOPY KNEE;  Surgeon: Ninetta Lights, MD;  Location: [place];  Service: Orthopedics;  Laterality: Right;  right knee scope with lateral meniscectomy, removal loose foreign body, and microfracture technique   LAMINECTOMY  1987   with discectomy   PVC ABLATION N/A 03/18/2020   Procedure: PVC ABLATION;  Surgeon: Evans Lance, MD;  Location: [place] [facility];  Service: Cardiovascular;  Laterality: N/A;   RIGHT/LEFT HEART CATH AND CORONARY ANGIOGRAPHY N/A 12/25/2019   Procedure: RIGHT/LEFT HEART CATH AND CORONARY ANGIOGRAPHY;  Surgeon: Wellington Hampshire, MD;  Location: [place] [facility];  Service: Cardiovascular;  Laterality: N/A;   ROTATOR CUFF REPAIR  2/11   rt   TOTAL HIP ARTHROPLASTY  2011   Left   TOTAL KNEE ARTHROPLASTY  06/01/2012   Procedure: TOTAL KNEE ARTHROPLASTY;  Surgeon: Ninetta Lights, MD;  Location: [place];  Service: Orthopedics;  Laterality: Right;    Current Meds  Medication Sig   aspirin 81 MG tablet Take 81 mg by mouth daily.    atorvastatin (LIPITOR) 40 MG tablet Take 1 tablet (40 mg total) by mouth daily.   carvedilol (COREG) 12.5 MG tablet TAKE 1 TABLET BY MOUTH 2 TIMES DAILY.   doxazosin (CARDURA) 1 MG tablet Take 1 tablet (1 mg total) by mouth daily.   fish oil-omega-3 fatty acids 1000 MG capsule Take 1 g by mouth 2 (two) times daily.    glucose blood test strip Use daily to check sugar. E11.9.  One touch test strips.   losartan (COZAAR) 25 MG tablet TAKE 1 TABLET (25 MG) BY MOUTH ONCE DAILY AT BEDTIME   metFORMIN (GLUCOPHAGE) 500 MG tablet Take 2 tablets (1,000 mg total) by mouth 2 (two) times daily with a meal.   omeprazole (PRILOSEC) 20 MG capsule Take 1 capsule (20 mg  total) by mouth daily.   PARoxetine (PAXIL) 20 MG tablet Take 1 tablet (20 mg total) by mouth daily.   spironolactone (ALDACTONE) 25 MG tablet TAKE 1/2 TABLET BY MOUTH EVERY DAY    No Known Allergies  Review of Systems negative except from HPI and PMH  Physical Exam BP 100/68 (BP Location: Left Arm, Patient Position: Sitting,  Cuff Size: Normal)   Pulse 76   Ht 5\' 10"  (1.778 m)   Wt 220 lb (99.8 kg)   SpO2 96%   BMI 31.57 kg/m  Well developed and well nourished in no acute distress HENT normal Neck supple with JVP-flat Clear Regular rate and rhythm, no  gallop No murmur Abd-soft with active BS No Clubbing cyanosis  edema Skin-warm and dry A & Oriented  Grossly normal sensory and motor function  ECG sinus 76 Interval 16/14/42 Right bundle branch block    Assessment and  Plan Nonischemic cardiomyopathy  Congestive heart failure-chronic-systolic class II   Bigeminal wide-complex beat,  relatively narrow with a prolonged intrinsicoid deflection and a fused appearance suggestive of a junctional trigger capturing the intrinsic conduction system.  Ablation GT 5/21; Silex 10/21  High Risk Medication Surveillance  Aldactone \ Sleep disordered breathing/sleep apnea   Diabetes   Hypertension  Orthostatic hypotension  Hypomagnesemia/hypokalemia    Functional status remains better.  We will reassess left ventricular function to see if there is any improvement following what seems to be successful ablation of his PVCs.  For now we will continue him on guideline directed therapy with losartan 25, not affording Entresto because of cost, carvedilol 12.5 twice daily.  Mild dyspnea on exertion.  Volume status is stable.  We will continue his Aldactone at 12.5 mg twice daily.  He has had hypomagnesemia.  He is run out, we will check it again.  I do not know whether it was simply consequential to diuretics.  We will check a potassium level today on his Aldactone  With his diabetes  and his ongoing dyspnea, particularly if there is any impairment of LV function, we will begin him on an SGLT2 if it is affordable.  I would do this in conjunction with Dr. Damita Dunnings his primary care physician as we would need to switch out his Glucophage.  Blood pressure is well controlled.  We will continue him on losartan, Aldactone and carvedilol as noted above    I,Stephanie Williams,acting as a scribe for Virl Axe, MD.,have documented all relevant documentation on the behalf of Virl Axe, MD,as directed by  Virl Axe, MD while in the presence of Virl Axe, MD.  I, Virl Axe, MD, have reviewed all documentation for this visit. The documentation on 04/22/21 for the exam, diagnosis, procedures, and orders are all accurate and complete.

## 2021-04-22 NOTE — Patient Instructions (Addendum)
Medication Instructions:  - Your physician has recommended you make the following change in your medication:   1) STOP magnesium  *If you need a refill on your cardiac medications before your next appointment, please call your pharmacy*   Lab Work: - Your physician recommends that you have lab work today: BMET/ Magnesium  If you have labs (blood work) drawn today and your tests are completely normal, you will receive your results only by: MyChart Message (if you have MyChart) OR A paper copy in the mail If you have any lab test that is abnormal or we need to change your treatment, we will call you to review the results.   Testing/Procedures: - Your physician has requested that you have an echocardiogram. Echocardiography is a painless test that uses sound waves to create images of your heart. It provides your doctor with information about the size and shape of your heart and how well your heart's chambers and valves are working. This procedure takes approximately one hour. There are no restrictions for this procedure. There is a possibility that an IV may need to be started during your test to inject an image enhancing agent. This is done to obtain more optimal pictures of your heart. Therefore we ask that you do at least drink some water prior to coming in to hydrate your veins.    Follow-Up: At [facility], you and your health needs are our priority.  As part of our continuing mission to provide you with exceptional heart care, we have created designated Provider Care Teams.  These Care Teams include your primary Cardiologist (physician) and Advanced Practice Providers (APPs -  Physician Assistants and Nurse Practitioners) who all work together to provide you with the care you need, when you need it.  We recommend signing up for the patient portal called "MyChart".  Sign up information is provided on this After Visit Summary.  MyChart is used to connect with patients for Virtual Visits  (Telemedicine).  Patients are able to view lab/test results, encounter notes, upcoming appointments, etc.  Non-urgent messages can be sent to your provider as well.   To learn more about what you can do with MyChart, go to [email or web address].    Your next appointment:   1 year  The format for your next appointment:   In Person  Provider:   Virl Axe, MD   Other Instructions  Echocardiogram An echocardiogram is a test that uses sound waves (ultrasound) to produce images of the heart. Images from an echocardiogram can provide important information about: Heart size and shape. The size and thickness and movement of your heart's walls. Heart muscle function and strength. Heart valve function or if you have stenosis. Stenosis is when the heart valves are too narrow. If blood is flowing backward through the heart valves (regurgitation). A tumor or infectious growth around the heart valves. Areas of heart muscle that are not working well because of poor blood flow or injury from a heart attack. Aneurysm detection. An aneurysm is a weak or damaged part of an artery wall. The wall bulges out from the normal force of blood pumping through the body. Tell a health care provider about: Any allergies you have. All medicines you are taking, including vitamins, herbs, eye drops, creams, and over-the-counter medicines. Any blood disorders you have. Any surgeries you have had. Any medical conditions you have. Whether you are pregnant or may be pregnant. What are the risks? Generally, this is a safe test. However, problems may occur,  including an allergic reaction to dye (contrast) that may be used during the test. What happens before the test? No specific preparation is needed. You may eat and drink normally. What happens during the test?  You will take off your clothes from the waist up and put on a hospital gown. Electrodes or electrocardiogram (ECG)patches may be placed on your  chest. The electrodes or patches are then connected to a device that monitors your heart rate and rhythm. You will lie down on a table for an ultrasound exam. A gel will be applied to your chest to help sound waves pass through your skin. A handheld device, called a transducer, will be pressed against your chest and moved over your heart. The transducer produces sound waves that travel to your heart and bounce back (or "echo" back) to the transducer. These sound waves will be captured in real-time and changed into images of your heart that can be viewed on a video monitor. The images will be recorded on a computer and reviewed by your health care provider. You may be asked to change positions or hold your breath for a short time. This makes it easier to get different views or better views of your heart. In some cases, you may receive contrast through an IV in one of your veins. This can improve the quality of the pictures from your heart. The procedure may vary among health care providers and hospitals. What can I expect after the test? You may return to your normal, everyday life, including diet, activities, andmedicines, unless your health care provider tells you not to do that. Follow these instructions at home: It is up to you to get the results of your test. Ask your health care provider, or the department that is doing the test, when your results will be ready. Keep all follow-up visits. This is important. Summary An echocardiogram is a test that uses sound waves (ultrasound) to produce images of the heart. Images from an echocardiogram can provide important information about the size and shape of your heart, heart muscle function, heart valve function, and other possible heart problems. You do not need to do anything to prepare before this test. You may eat and drink normally. After the echocardiogram is completed, you may return to your normal, everyday life, unless your health care provider  tells you not to do that. This information is not intended to replace advice given to you by your health care provider. Make sure you discuss any questions you have with your healthcare provider. Document Revised: 06/18/2020 Document Reviewed: 06/18/2020 Elsevier Patient Education  2022 [employer].

## 2021-04-23 LAB — BASIC METABOLIC PANEL
BUN/Creatinine Ratio: 17 (ref 10–24)
BUN: 19 mg/dL (ref 8–27)
CO2: 24 mmol/L (ref 20–29)
Calcium: 9.8 mg/dL (ref 8.6–10.2)
Chloride: 102 mmol/L (ref 96–106)
Creatinine, Ser: 1.1 mg/dL (ref 0.76–1.27)
Glucose: 148 mg/dL — ABNORMAL HIGH (ref 65–99)
Potassium: 5.2 mmol/L (ref 3.5–5.2)
Sodium: 139 mmol/L (ref 134–144)
eGFR: 73 mL/min/{1.73_m2} (ref >59)

## 2021-04-23 LAB — MAGNESIUM: Magnesium: 1.7 mg/dL (ref 1.6–2.3)

## 2021-05-08 ENCOUNTER — Telehealth: Payer: Self-pay | Admitting: Internal Medicine

## 2021-05-08 ENCOUNTER — Other Ambulatory Visit: Payer: Self-pay

## 2021-05-08 DIAGNOSIS — I5022 Chronic systolic (congestive) heart failure: Secondary | ICD-10-CM

## 2021-05-08 NOTE — Telephone Encounter (Signed)
Attempted to call the patient. No answer- I left a message to please call back.  

## 2021-05-08 NOTE — Telephone Encounter (Signed)
Tim Sprang, MD  05/06/2021  9:52 PM EDT      Please Inform Patient   Labs are normal x K is at upper limit of normal   can we please recheck in 4weeks    Thanks

## 2021-05-08 NOTE — Telephone Encounter (Signed)
Lamar Laundry, RN  05/08/2021 10:15 AM EDT      Results released to mychart. Order placed for 4 wk repeat bmp.

## 2021-05-09 NOTE — Telephone Encounter (Signed)
Patient returning call.

## 2021-05-09 NOTE — Telephone Encounter (Signed)
I spoke with the patient. I have advised him of his lab results and Dr. Olin Pia recommendations to repeat his BMP in ~ 4 weeks to insure stability with his K+ level.  The patient voices understanding and is agreeable.  He is aware he may come to the [facility] around the 1 st of August to have this repeated on a walk in basis.

## 2021-06-12 ENCOUNTER — Telehealth: Payer: Self-pay | Admitting: *Deleted

## 2021-06-12 DIAGNOSIS — I428 Other cardiomyopathies: Secondary | ICD-10-CM

## 2021-06-12 DIAGNOSIS — I5022 Chronic systolic (congestive) heart failure: Secondary | ICD-10-CM

## 2021-06-12 DIAGNOSIS — Z79899 Other long term (current) drug therapy: Secondary | ICD-10-CM

## 2021-06-12 NOTE — Telephone Encounter (Signed)
-----   Message from Emily Filbert, RN sent at 05/09/2021 10:25 AM EDT ----- Did he have his repeat BMP 8/1?

## 2021-06-12 NOTE — Telephone Encounter (Signed)
I have reviewed the patient's chart. He has not had a repeat BMP done.  He is scheduled for an echocardiogram in our office tomorrow at 9:30 am.  I attempted to call the patient to remind him to get his labs done. No answer- I left a detailed message on his voice mail (ok per DPR), that we could draw the lab while he was in the office tomorrow. I will place a lab order and appointment for this, but to please notify the front desk when checking in that he is here for echo/ labs.   I asked that he call back with any further questions/ concerns.

## 2021-06-13 ENCOUNTER — Other Ambulatory Visit
Admission: RE | Admit: 2021-06-13 | Discharge: 2021-06-13 | Disposition: A | Discharge status: Home / Self Care | Payer: Medicare HMO | Source: Ambulatory Visit | Attending: Internal Medicine | Admitting: Internal Medicine

## 2021-06-13 ENCOUNTER — Other Ambulatory Visit: Payer: Self-pay | Admitting: *Deleted

## 2021-06-13 ENCOUNTER — Other Ambulatory Visit: Payer: Self-pay

## 2021-06-13 ENCOUNTER — Ambulatory Visit (INDEPENDENT_AMBULATORY_CARE_PROVIDER_SITE_OTHER): Payer: Medicare HMO

## 2021-06-13 ENCOUNTER — Other Ambulatory Visit: Payer: Medicare HMO

## 2021-06-13 DIAGNOSIS — I5022 Chronic systolic (congestive) heart failure: Secondary | ICD-10-CM | POA: Diagnosis not present

## 2021-06-13 DIAGNOSIS — I5042 Chronic combined systolic (congestive) and diastolic (congestive) heart failure: Secondary | ICD-10-CM

## 2021-06-13 DIAGNOSIS — I428 Other cardiomyopathies: Secondary | ICD-10-CM

## 2021-06-13 DIAGNOSIS — Z79899 Other long term (current) drug therapy: Secondary | ICD-10-CM | POA: Diagnosis not present

## 2021-06-13 LAB — BASIC METABOLIC PANEL
Anion gap: 9 (ref 5–15)
BUN: 31 mg/dL — ABNORMAL HIGH (ref 8–23)
CO2: 25 mmol/L (ref 22–32)
Calcium: 9.2 mg/dL (ref 8.9–10.3)
Chloride: 106 mmol/L (ref 98–111)
Creatinine, Ser: 1.04 mg/dL (ref 0.61–1.24)
GFR, Estimated: >60 mL/min (ref >60)
Glucose, Bld: 138 mg/dL — ABNORMAL HIGH (ref 70–99)
Potassium: 4.5 mmol/L (ref 3.5–5.1)
Sodium: 140 mmol/L (ref 135–145)

## 2021-06-13 LAB — ECHOCARDIOGRAM COMPLETE
AR max vel: 2.2 cm2
AV Area VTI: 2.29 cm2
AV Area mean vel: 1.98 cm2
AV Mean grad: 4 mmHg
AV Peak grad: 6.1 mmHg
Ao pk vel: 1.23 m/s
Area-P 1/2: 2.96 cm2

## 2021-06-13 MED ORDER — PERFLUTREN LIPID MICROSPHERE
1.0000 mL | INTRAVENOUS | Status: AC | PRN
  Administered 2021-06-13: 2 mL via INTRAVENOUS

## 2021-06-17 ENCOUNTER — Encounter: Payer: Self-pay | Admitting: Internal Medicine

## 2021-06-17 NOTE — Progress Notes (Signed)
Please Inform Patient That echo EF shows marked improvement to 50/55% range following PVC ablation   Thanks

## 2021-07-03 ENCOUNTER — Other Ambulatory Visit: Payer: Self-pay

## 2021-07-03 ENCOUNTER — Encounter: Payer: Self-pay | Admitting: Family Medicine

## 2021-07-03 ENCOUNTER — Ambulatory Visit (INDEPENDENT_AMBULATORY_CARE_PROVIDER_SITE_OTHER): Payer: Medicare HMO | Admitting: Family Medicine

## 2021-07-03 VITALS — BP 118/74 | HR 87 | Temp 97.0°F | Ht 70.0 in | Wt 225.0 lb

## 2021-07-03 DIAGNOSIS — R202 Paresthesia of skin: Secondary | ICD-10-CM

## 2021-07-03 DIAGNOSIS — D179 Benign lipomatous neoplasm, unspecified: Secondary | ICD-10-CM

## 2021-07-03 DIAGNOSIS — I428 Other cardiomyopathies: Secondary | ICD-10-CM

## 2021-07-03 DIAGNOSIS — E119 Type 2 diabetes mellitus without complications: Secondary | ICD-10-CM

## 2021-07-03 LAB — POCT GLYCOSYLATED HEMOGLOBIN (HGB A1C): Hemoglobin A1C: 6.9 % — AB (ref 4.0–5.6)

## 2021-07-03 NOTE — Patient Instructions (Addendum)
Plan on recheck in about 4 months with A1c at the visit.  Ask the front about getting an appointment with Dr. Lorelei Pont about injecting your right wrist Take care.  Glad to see you.  Likely a lipoma.  Let me know if you want to see the surgeons about it.  Likely a benign fatty spot.

## 2021-07-03 NOTE — Progress Notes (Signed)
This visit occurred during the SARS-CoV-2 public health emergency.  Safety protocols were in place, including screening questions prior to the visit, additional usage of staff PPE, and extensive cleaning of exam room while observing appropriate contact time as indicated for disinfecting solutions.  Diabetes:  Using medications without difficulties:yes, metformin '1000mg'$  BID.  Hypoglycemic episodes: only if prolonged fasting, corrected with a snack, down to the 80s, not common.   Hyperglycemic episodes: no Feet problems:no Blood Sugars averaging: 120-160 eye exam within last year: no A1c improved to 6.9.  he cut back on carbs.   We talked about lipitor use.  He has back pain at baseline with h/o back surgery noted.  He attributed recent aches to his back and working in the yard, not from the statin.  He'll update me as needed.   We agreed it was reasonable to continue statin as is for now.  History of cardiomyopathy.  No chest pain.  EF improved to 50/55% on recent echo.  D/w pt.    Burning in R hand, with numbness.  Tried brace w/o relief.  He doesn't have similar sx in the L hand or R foot. He has normal grip for gross motor in the R hand but fine motor is affected by altered sensation.   LLQ lesion/sx, present for years. Noted it if bumps into it.  Can feel a lump occ, ie bulge.  Not bigger but can feel it sometimes more than others.    Meds, vitals, and allergies reviewed.   ROS: Per HPI unless specifically indicated in ROS section   GEN: nad, alert and oriented HEENT: ncat NECK: supple w/o LA CV: rrr. PULM: ctab, no inc wob ABD: soft, +bs, it feels like he has a lipoma on the L abd wall, it does not enlarge with a cough and it does not feel like a reducible hernia.  No overlying bruise or rash.  Not tender. EXT: no edema SKIN: no acute rash R wrist tinel pos with dec sensation to monofilament.  Normal grip and radial pulse.   Diabetic foot exam: Normal inspection No skin  breakdown No calluses  Normal DP pulses Normal sensation to light touch and monofilament Nails normal  30 minutes were devoted to patient care in this encounter (this includes time spent reviewing the patient's file/history, interviewing and examining the patient, counseling/reviewing plan with patient).

## 2021-07-04 ENCOUNTER — Ambulatory Visit: Payer: PRIVATE HEALTH INSURANCE | Admitting: Family Medicine

## 2021-07-04 DIAGNOSIS — H1849 Other corneal degeneration: Secondary | ICD-10-CM | POA: Diagnosis not present

## 2021-07-06 DIAGNOSIS — D179 Benign lipomatous neoplasm, unspecified: Secondary | ICD-10-CM | POA: Insufficient documentation

## 2021-07-06 NOTE — Assessment & Plan Note (Signed)
Likely carpal tunnel symptoms. He can ask the front about getting an appointment with Dr. Lorelei Pont about injecting his right wrist.  He will update me as needed.

## 2021-07-06 NOTE — Assessment & Plan Note (Signed)
We talked about his ejection fraction and heart failure pathophysiology.  Rationale for current medications discussed, especially ARB and spironolactone.  Would continue both.  He agrees with plan.

## 2021-07-06 NOTE — Assessment & Plan Note (Signed)
Likely a lipoma.  He can let me know if he wants to see the surgeons about it.  Discussed.

## 2021-07-06 NOTE — Assessment & Plan Note (Signed)
Plan on recheck in about 4 months with A1c at the visit.  A1c improved.  Continue metformin.

## 2021-07-09 ENCOUNTER — Ambulatory Visit (INDEPENDENT_AMBULATORY_CARE_PROVIDER_SITE_OTHER): Payer: Medicare HMO | Admitting: Family Medicine

## 2021-07-09 ENCOUNTER — Other Ambulatory Visit: Payer: Self-pay

## 2021-07-09 ENCOUNTER — Encounter: Payer: Self-pay | Admitting: Family Medicine

## 2021-07-09 VITALS — BP 100/60 | HR 77 | Temp 97.9°F | Ht 65.0 in | Wt 224.5 lb

## 2021-07-09 DIAGNOSIS — G5601 Carpal tunnel syndrome, right upper limb: Secondary | ICD-10-CM

## 2021-07-09 MED ORDER — TRIAMCINOLONE ACETONIDE 40 MG/ML IJ SUSP
20.0000 mg | Freq: Once | INTRAMUSCULAR | Status: AC
  Administered 2021-07-09: 20 mg via INTRA_ARTICULAR

## 2021-07-09 NOTE — Progress Notes (Signed)
Tim Mccullough T. Pearson Reasons, MD, CAQ Sports Medicine [facility] at [facility] [address] [place], [address]  Phone: [phone]  FAX: [phone]  Tim Mccullough - 70 y.o. male  MRN [id number]  Date of Birth: 07-Sep-1951  Date: 07/09/2021  PCP: Joaquim Nam, MD  Referral: Joaquim Nam, MD  Chief Complaint  Patient presents with   Wrist Pain    Right-?Carpal Tunnel    This visit occurred during the SARS-CoV-2 public health emergency.  Safety protocols were in place, including screening questions prior to the visit, additional usage of staff PPE, and extensive cleaning of exam room while observing appropriate contact time as indicated for disinfecting solutions.   Subjective:   Tim Mccullough is a 70 y.o. very pleasant male patient with Body mass index is 37.36 kg/m. who presents with the following:  He is a pleasant gentleman seen in request by Dr. Para March.  He presents with some right-sided tingling and paresthesias that has been present for multiples of months.  He has had some issues even longer than that, but this has been progressing.  He has burning at night, and at times this will even wake him up at night.  He is left-hand dominant.  He has tried a carpal tunnel splint without much significant relief.  CTS:    LHD  R grip is a little weaker   Review of Systems is noted in the HPI, as appropriate  Objective:   BP 100/60   Pulse 77   Temp 97.9 F (36.6 C) (Temporal)   Ht 5\' 5"  (1.651 m)   Wt 224 lb 8 oz (101.8 kg)   SpO2 96%   BMI 37.36 kg/m   EXTR: No clubbing/cyanosis/edema Normal gait  Hand: R Ecchymosis or edema: neg ROM wrist/hand/digits/elbow: full  Carpals, MCP's, digits: NT Distal Ulna and Radius: NT Supination lift test: neg Ecchymosis or edema: neg Cysts/nodules: neg Finkelstein's test: neg Snuffbox tenderness: neg Scaphoid tubercle: NT Hook of Hamate: NT Resisted supination: NT Full composite  fist Grip, all digits: 5/5 str No tenosynovitis Axial load test: neg Phalen's: + Tinel's: + Atrophy: neg  Hand sensation: intact   Laboratory and Imaging Data:  Assessment and Plan:     ICD-10-CM   1. Carpal tunnel syndrome on right  G56.01 triamcinolone acetonide (KENALOG-40) injection 20 mg     We discussed the anatomy involved, and that carpal tunnel syndrome primarily involves the median nerve, and this typically affects digits one through 3.   We also discussed that mild cases of carpal tunnel syndrome are often improved with night splints.  If symptoms persist it is very reasonable to consider a carpal tunnel injection.   If the patient does have moderate to severe carpal tunnel syndrome based on NCV, then it is certainly reasonable to consider surgical consultation for definitive management possible carpal tunnel release.  At this point, the patient like to proceed conservatively.   Carpal Tunnel Injection Procedure Note Tim Mccullough [birth year] Date of procedure: 07/09/2021  Procedure: Carpal Tunnel Injection, R Indications: Numbness  Procedure Details Verbal consent was obtained. Risks, benefits, and alternatives were discussed. Prepped with Chloraprep and Ethyl Chloride used for anesthesia. Under sterile conditions,  the patient was injected just ulnar to the palmaris longus tendon at the wrist flexion crease.  The needle was inserted at 45 degree angle aiming distally. Aspiration showed no blood. Medication flowed freely without resistance.  Needle size: 22 gauge 1 1/2 inch Injection: 1/2  cc of Lidocaine 1% and Kenalog 20 mg Medication: 1/2 cc of Kenalog 40 mg (equaling Kenalog 20 mg)   Meds ordered this encounter  Medications   triamcinolone acetonide (KENALOG-40) injection 20 mg    Follow-up: prn  Dragon [employer] One speech-to-text software was used for transcription in this dictation.  Possible transcriptional errors can occur using Animal nutritionist.    Signed,  Elpidio Galea. Marquese Burkland, MD   Outpatient Encounter Medications as of 07/09/2021  Medication Sig   aspirin 81 MG tablet Take 81 mg by mouth daily.    atorvastatin (LIPITOR) 40 MG tablet Take 1 tablet (40 mg total) by mouth daily.   carvedilol (COREG) 12.5 MG tablet TAKE 1 TABLET BY MOUTH 2 TIMES DAILY.   doxazosin (CARDURA) 1 MG tablet Take 1 tablet (1 mg total) by mouth daily.   fish oil-omega-3 fatty acids 1000 MG capsule Take 1 g by mouth 2 (two) times daily.    glucose blood test strip Use daily to check sugar. E11.9.  One touch test strips.   losartan (COZAAR) 25 MG tablet TAKE 1 TABLET (25 MG) BY MOUTH ONCE DAILY AT BEDTIME   metFORMIN (GLUCOPHAGE) 500 MG tablet Take 2 tablets (1,000 mg total) by mouth 2 (two) times daily with a meal.   omeprazole (PRILOSEC) 20 MG capsule Take 1 capsule (20 mg total) by mouth daily.   PARoxetine (PAXIL) 20 MG tablet Take 1 tablet (20 mg total) by mouth daily.   spironolactone (ALDACTONE) 25 MG tablet TAKE 1/2 TABLET BY MOUTH EVERY DAY   [EXPIRED] triamcinolone acetonide (KENALOG-40) injection 20 mg    No facility-administered encounter medications on file as of 07/09/2021.
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
## 2021-07-16 ENCOUNTER — Other Ambulatory Visit: Payer: Self-pay

## 2021-07-16 ENCOUNTER — Ambulatory Visit (INDEPENDENT_AMBULATORY_CARE_PROVIDER_SITE_OTHER): Payer: Medicare HMO | Admitting: Family Medicine

## 2021-07-16 ENCOUNTER — Encounter: Payer: Self-pay | Admitting: Family Medicine

## 2021-07-16 ENCOUNTER — Ambulatory Visit
Admission: RE | Admit: 2021-07-16 | Discharge: 2021-07-16 | Disposition: A | Discharge status: Home / Self Care | Payer: Medicare HMO | Source: Ambulatory Visit | Attending: Family Medicine | Admitting: Family Medicine

## 2021-07-16 VITALS — BP 90/68 | HR 89 | Temp 98.3°F | Ht 65.0 in | Wt 225.5 lb

## 2021-07-16 DIAGNOSIS — M25512 Pain in left shoulder: Secondary | ICD-10-CM | POA: Diagnosis not present

## 2021-07-16 DIAGNOSIS — S46212A Strain of muscle, fascia and tendon of other parts of biceps, left arm, initial encounter: Secondary | ICD-10-CM | POA: Diagnosis not present

## 2021-07-16 DIAGNOSIS — S46012A Strain of muscle(s) and tendon(s) of the rotator cuff of left shoulder, initial encounter: Secondary | ICD-10-CM

## 2021-07-16 DIAGNOSIS — G8929 Other chronic pain: Secondary | ICD-10-CM

## 2021-07-16 DIAGNOSIS — M75102 Unspecified rotator cuff tear or rupture of left shoulder, not specified as traumatic: Secondary | ICD-10-CM | POA: Diagnosis not present

## 2021-07-16 DIAGNOSIS — M19012 Primary osteoarthritis, left shoulder: Secondary | ICD-10-CM | POA: Diagnosis not present

## 2021-07-16 NOTE — Patient Instructions (Signed)
Go get your x-ray today and the office will schedule your MRI

## 2021-07-22 DIAGNOSIS — I110 Hypertensive heart disease with heart failure: Secondary | ICD-10-CM | POA: Diagnosis not present

## 2021-07-22 DIAGNOSIS — E669 Obesity, unspecified: Secondary | ICD-10-CM | POA: Diagnosis not present

## 2021-07-22 DIAGNOSIS — E1165 Type 2 diabetes mellitus with hyperglycemia: Secondary | ICD-10-CM | POA: Diagnosis not present

## 2021-07-22 DIAGNOSIS — I2510 Atherosclerotic heart disease of native coronary artery without angina pectoris: Secondary | ICD-10-CM | POA: Diagnosis not present

## 2021-07-22 DIAGNOSIS — E785 Hyperlipidemia, unspecified: Secondary | ICD-10-CM | POA: Diagnosis not present

## 2021-07-22 DIAGNOSIS — E261 Secondary hyperaldosteronism: Secondary | ICD-10-CM | POA: Diagnosis not present

## 2021-07-22 DIAGNOSIS — I4891 Unspecified atrial fibrillation: Secondary | ICD-10-CM | POA: Diagnosis not present

## 2021-07-22 DIAGNOSIS — G8929 Other chronic pain: Secondary | ICD-10-CM | POA: Diagnosis not present

## 2021-07-22 DIAGNOSIS — R69 Illness, unspecified: Secondary | ICD-10-CM | POA: Diagnosis not present

## 2021-07-22 DIAGNOSIS — I509 Heart failure, unspecified: Secondary | ICD-10-CM | POA: Diagnosis not present

## 2021-07-22 DIAGNOSIS — D6869 Other thrombophilia: Secondary | ICD-10-CM | POA: Diagnosis not present

## 2021-07-28 DIAGNOSIS — M7582 Other shoulder lesions, left shoulder: Secondary | ICD-10-CM | POA: Diagnosis not present

## 2021-08-02 ENCOUNTER — Ambulatory Visit
Admission: RE | Admit: 2021-08-02 | Discharge: 2021-08-02 | Disposition: A | Discharge status: Home / Self Care | Payer: Medicare HMO | Source: Ambulatory Visit | Attending: Family Medicine | Admitting: Family Medicine

## 2021-08-02 ENCOUNTER — Other Ambulatory Visit: Payer: Self-pay

## 2021-08-02 DIAGNOSIS — G8929 Other chronic pain: Secondary | ICD-10-CM | POA: Diagnosis not present

## 2021-08-02 DIAGNOSIS — S46212A Strain of muscle, fascia and tendon of other parts of biceps, left arm, initial encounter: Secondary | ICD-10-CM

## 2021-08-02 DIAGNOSIS — M25512 Pain in left shoulder: Secondary | ICD-10-CM | POA: Insufficient documentation

## 2021-08-02 DIAGNOSIS — S46112A Strain of muscle, fascia and tendon of long head of biceps, left arm, initial encounter: Secondary | ICD-10-CM | POA: Diagnosis not present

## 2021-08-02 DIAGNOSIS — M25412 Effusion, left shoulder: Secondary | ICD-10-CM | POA: Diagnosis not present

## 2021-08-02 DIAGNOSIS — S46012A Strain of muscle(s) and tendon(s) of the rotator cuff of left shoulder, initial encounter: Secondary | ICD-10-CM | POA: Diagnosis not present

## 2021-08-06 DIAGNOSIS — M7582 Other shoulder lesions, left shoulder: Secondary | ICD-10-CM | POA: Diagnosis not present

## 2021-08-06 NOTE — Addendum Note (Signed)
Addended by: Owens Loffler on: 08/06/2021 05:10 PM   Modules accepted: Orders

## 2021-08-12 ENCOUNTER — Other Ambulatory Visit: Payer: Self-pay | Admitting: Orthopaedic Surgery

## 2021-08-12 DIAGNOSIS — M25512 Pain in left shoulder: Secondary | ICD-10-CM

## 2021-08-12 DIAGNOSIS — M7582 Other shoulder lesions, left shoulder: Secondary | ICD-10-CM | POA: Diagnosis not present

## 2021-08-13 ENCOUNTER — Telehealth: Payer: Self-pay | Admitting: *Deleted

## 2021-08-13 NOTE — Telephone Encounter (Signed)
   [facility] Pre-operative Risk Assessment    Patient Name: Tim Mccullough  DOB: 06/17/1951 MRN: [id number]  HEARTCARE STAFF:  - IMPORTANT!!!!!! Under Visit Info/Reason for Call, type in Other and utilize the format Clearance MM/DD/YY or Clearance TBD. Do not use dashes or single digits. - Please review there is not already an duplicate clearance open for this procedure. - If request is for dental extraction, please clarify the # of teeth to be extracted. - If the patient is currently at the dentist's office, call Pre-Op Callback Staff (MA/nurse) to input urgent request.  - If the patient is not currently in the dentist office, please route to the Pre-Op pool.  Request for surgical clearance:  What type of surgery is being performed?  LT REVERSE TOTAL SHOULDER REPLACEMENT  When is this surgery scheduled?  TBD  What type of clearance is required (medical clearance vs. Pharmacy clearance to hold med vs. Both)?  BOTH  Are there any medications that need to be held prior to surgery and how long?  ASPIRIN  Practice name and name of physician performing surgery?  [facility] / DR. VARKEY   What is the office phone number?  [phone]   7.   What is the office fax number?  [phone] ATTN:  SHERRY  8.   Anesthesia type (None, local, MAC, general) ?     Jeanann Lewandowsky 08/13/2021, 6:53 AM  _________________________________________________________________   (provider comments below)

## 2021-08-13 NOTE — Telephone Encounter (Addendum)
   Name: Tim Mccullough  DOB: 1951-04-04  MRN: [id number]   Primary Cardiologist: [name], MD, EP - Dr. Caryl Comes  Chart reviewed as part of pre-operative protocol coverage. Patient was contacted 08/13/2021 in reference to pre-operative risk assessment for pending surgery as outlined below.  Tim Mccullough was last seen 04/2021 by Dr. Caryl Comes, chart reviewed. Primary cardiac issues have been NICM, PACs, PVCs, VSD (referenced as small hemodynamically insignificant VSD per MD note), mild internal carotid artery disease. Cath 2021 showed normal coronary arteries. Last echo 06/2021 showed substantial improvement in EF to 50-55% (previously 30-35%). RCRI is 0.9% for CHF indicating low CV risk overall. I reached out to patient for update on how he is doing. The patient affirms he has been doing well without any new cardiac symptoms. Able to exceed 4 METS without any functional cardiac limitation/symptoms. States, "I can do just about everything I want to except lift with that left arm because of my shoulder." Therefore, based on ACC/AHA guidelines, the patient would be at acceptable risk for the planned procedure without further cardiovascular testing. The patient was advised that if he develops new symptoms prior to surgery to contact our office to arrange for a follow-up visit, and he verbalized understanding.  Would typically suggest to continue aspirin when able, but he has no prior hx of stents, bypass, TIA or stroke therefore OK to hold ASA if needed for procedure (usually held 5-7 days but will defer to surgeon for final decision). I did confer with Dr. Fletcher Anon to ensure there was no issue holding ASA with prior h/o small VSD and he agrees.  I will route this recommendation to the requesting party via Epic fax function and remove from pre-op pool. Please call with questions.  Charlie Pitter, PA-C 08/13/2021, 12:12 PM

## 2021-08-23 ENCOUNTER — Ambulatory Visit
Admission: RE | Admit: 2021-08-23 | Discharge: 2021-08-23 | Disposition: A | Discharge status: Home / Self Care | Payer: Medicare HMO | Source: Ambulatory Visit | Attending: Orthopaedic Surgery | Admitting: Orthopaedic Surgery

## 2021-08-23 ENCOUNTER — Other Ambulatory Visit: Payer: Self-pay

## 2021-08-23 DIAGNOSIS — M25512 Pain in left shoulder: Secondary | ICD-10-CM | POA: Diagnosis not present

## 2021-10-27 NOTE — Progress Notes (Signed)
Sent message, via epic in basket, requesting orders in epic from surgeon.  

## 2021-10-28 ENCOUNTER — Other Ambulatory Visit: Payer: Self-pay

## 2021-10-28 ENCOUNTER — Ambulatory Visit (INDEPENDENT_AMBULATORY_CARE_PROVIDER_SITE_OTHER): Payer: Medicare HMO | Admitting: Internal Medicine

## 2021-10-28 ENCOUNTER — Encounter: Payer: Self-pay | Admitting: Internal Medicine

## 2021-10-28 DIAGNOSIS — R300 Dysuria: Secondary | ICD-10-CM

## 2021-10-28 LAB — POC URINALSYSI DIPSTICK (AUTOMATED)
Bilirubin, UA: NEGATIVE
Blood, UA: NEGATIVE
Glucose, UA: NEGATIVE
Ketones, UA: NEGATIVE
Nitrite, UA: NEGATIVE
Protein, UA: NEGATIVE
Spec Grav, UA: 1.025 (ref 1.010–1.025)
Urobilinogen, UA: 0.2 E.U./dL
pH, UA: 6 (ref 5.0–8.0)

## 2021-10-28 MED ORDER — SULFAMETHOXAZOLE-TRIMETHOPRIM 800-160 MG PO TABS: 1.0000 | ORAL_TABLET | Freq: Two times a day (BID) | ORAL | 0 refills | Status: DC

## 2021-10-28 NOTE — Patient Instructions (Signed)
Please start the antibiotic right away and take two doses today. If your symptoms are completely gone by tomorrow, you can stop after 3 days.

## 2021-10-28 NOTE — Assessment & Plan Note (Addendum)
Appears to have uncomplicated cystitis--though not that common in men Urinalysis does show 1+ leuks Will send urine Rx with bactrim DS----plan 7 days (but 3 may be enough)

## 2021-10-28 NOTE — Progress Notes (Signed)
Subjective:    Patient ID: Tim Mccullough, male    DOB: [birth year], 70 y.o.   MRN: [id number]  HPI Here due to urinary symptoms  Yesterday, noticed urinary frequency---like every 10-15 minutes Only a small amount and some dysuria Daughter told him to try some lemon juice in water---helped some  Normally voids pretty well--only changed yesterday No recent sexual intercourse  Current Outpatient Medications on File Prior to Visit  Medication Sig Dispense Refill   acetaminophen (TYLENOL) 650 MG CR tablet Take 1,300 mg by mouth every 8 (eight) hours as needed for pain.     aspirin 81 MG tablet Take 81 mg by mouth daily.      atorvastatin (LIPITOR) 40 MG tablet Take 1 tablet (40 mg total) by mouth daily. 90 tablet 3   carvedilol (COREG) 12.5 MG tablet TAKE 1 TABLET BY MOUTH 2 TIMES DAILY. 180 tablet 3   doxazosin (CARDURA) 1 MG tablet Take 1 tablet (1 mg total) by mouth daily. 90 tablet 3   esomeprazole (NEXIUM) 20 MG capsule Take 20 mg by mouth daily at 12 noon.     fish oil-omega-3 fatty acids 1000 MG capsule Take 1 g by mouth 2 (two) times daily.      glucose blood test strip Use daily to check sugar. E11.9.  One touch test strips. 100 each 12   losartan (COZAAR) 25 MG tablet TAKE 1 TABLET (25 MG) BY MOUTH ONCE DAILY AT BEDTIME 90 tablet 3   metFORMIN (GLUCOPHAGE) 500 MG tablet Take 2 tablets (1,000 mg total) by mouth 2 (two) times daily with a meal. 360 tablet 3   Naphazoline-Pheniramine (OPCON-A) 0.027-0.315 % SOLN Place 1 drop into both eyes daily.     omeprazole (PRILOSEC) 20 MG capsule Take 1 capsule (20 mg total) by mouth daily. 30 capsule 11   PARoxetine (PAXIL) 20 MG tablet Take 1 tablet (20 mg total) by mouth daily. 90 tablet 3   spironolactone (ALDACTONE) 25 MG tablet TAKE 1/2 TABLET BY MOUTH EVERY DAY 45 tablet 3   No current facility-administered medications on file prior to visit.    No Known Allergies  Past Medical History:  Diagnosis Date   Abnormal echocardiogram  2/11   Repeated after med mgmt of cardiomyopathy showed EF 40-45%, mildly dilated LV, mild LV hypertrophy, anterolateral and apical hypokinesis, mild diastolic dysfunction.   Anemia    hx   Arthritis    Blood transfusion without reported diagnosis 09/2012   BPH (benign prostatic hypertrophy) 2004   Cancer [facility])    melanoma back   Cardiomyopathy    ? tachycardia-mediated   Depression    Diabetes mellitus 11/2006   Type II   Diverticulosis    Echocardiogram abnormal 7/10   Mild LVH with a mildly dilated LV.  EF was difficult to assess given poor acoustic windows but appeared moderately decreased.  Mild MR.  Probable small perimembranous VSD, mild LAE   Esophageal erosions    GERD (gastroesophageal reflux disease)    H/O cardiac radiofrequency ablation    Hip pain    History of MRI 7/10   Cardiac MRI was done to followup difficult echo.  This showed mild to moderately dilated LV, mild to moderate LAE, EF  33% with global hypokinesis, normal RV size with mild systolic dysfunction.  There was no large VSD evident.  There was no myocardial delayed enhancement so no evidence for infiltrative disease or infarction.  LHC (7/10) showed only luminal irreg. in the coronaries and  EF 35%   Holter monitor, abnormal 7/10   Frequent PAC's and PVC's  Average HR 96  (but he had started Toprol XL).  HR range 71-130   Hyperlipidemia 04/1998   Hypertension 1995   Obesity    OSA (obstructive sleep apnea)    Not using CPAP- improved with weight loss   Pneumonia    HX OF pna   Sinus tachycardia    Pt says he has been told his heart rate is high since he was in high school.    Sleep apnea    has cpap; but does not use   VSD (ventricular septal defect)    Probable small perimembranous    Past Surgical History:  Procedure Laterality Date   CARDIAC CATHETERIZATION  05/30/2009   Dilated cardiomyopathy.  min VSD.  Hypokin Inf  wall.  Multiple min luminal Irr.  EF 35%   CATARACT EXTRACTION, BILATERAL      COLONOSCOPY W/ BIOPSIES  08/24/2006   Divertics, polyps x3, bx negative, repeat 2012 with mild diverticulosis in teh sigmoid colon and 1 polyp in tranverse colon, repeat due 2017   ESOPHAGOGASTRODUODENOSCOPY  2012   1)Barrett's esoph, 2) Erosive esophagitis, 3) Mild gastritis in the antrum, 4) Duodenitis in the bulb of duodenum, 5) Small hiatal hernia   INGUINAL HERNIA REPAIR  1985   Left   INGUINAL HERNIA REPAIR  1999   Right   KNEE ARTHROSCOPY  10/15/2011   Procedure: ARTHROSCOPY KNEE;  Surgeon: Ninetta Lights, MD;  Location: [place];  Service: Orthopedics;  Laterality: Right;  right knee scope with lateral meniscectomy, removal loose foreign body, and microfracture technique   LAMINECTOMY  1987   with discectomy   PVC ABLATION N/A 03/18/2020   Procedure: PVC ABLATION;  Surgeon: Evans Lance, MD;  Location: [place] [facility];  Service: Cardiovascular;  Laterality: N/A;   RIGHT/LEFT HEART CATH AND CORONARY ANGIOGRAPHY N/A 12/25/2019   Procedure: RIGHT/LEFT HEART CATH AND CORONARY ANGIOGRAPHY;  Surgeon: Wellington Hampshire, MD;  Location: [place] [facility];  Service: Cardiovascular;  Laterality: N/A;   ROTATOR CUFF REPAIR  2/11   rt   TOTAL HIP ARTHROPLASTY  2011   Left   TOTAL KNEE ARTHROPLASTY  06/01/2012   Procedure: TOTAL KNEE ARTHROPLASTY;  Surgeon: Ninetta Lights, MD;  Location: [place];  Service: Orthopedics;  Laterality: Right;    Family History  Problem Relation Age of Onset   Heart disease Father        CABG   Stroke Father    Diabetes Father    Hypertension Mother    Alzheimer's disease Mother    Diabetes Sister    Hypertension Sister    Cancer Sister        uterine cancer   Diabetes Brother    Throat cancer Brother        Throat   Esophageal cancer Brother    Diabetes Sister    Hypertension Sister    Throat cancer Other        Throat   Diabetes Other    Alcohol abuse Other    Diabetes Maternal Grandmother        Insulin   Colon cancer  Cousin    Esophageal cancer Maternal Grandfather    Drug abuse Neg Hx    Prostate cancer Neg Hx    Rectal cancer Neg Hx     Social History   Socioeconomic History   Marital status: Single    Spouse  name: Not on file   Number of children: 3   Years of education: Not on file   Highest education level: Not on file  Occupational History   Occupation: Systems analyst: [employer]   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  Tobacco Use   Smoking status: Former    Years: 20.00    Types: Cigarettes    Quit date: 11/09/1998    Years since quitting: 22.9   Smokeless tobacco: Former    Quit date: 06/03/2007   Tobacco comments:    no alcohol since nov 12  Vaping Use   Vaping Use: Never used  Substance and Sexual Activity   Alcohol use: No    Alcohol/week: 0.0 standard drinks    Comment: sober as 09/2011   Drug use: No   Sexual activity: Not Currently  Other Topics Concern   Not on file  Social History Narrative   Lives in [place], lives alone   3 kids (2 sons, 1 daughter)   Divorced   Social Determinants of Health   Financial Resource Strain: Not on file  Food Insecurity: Not on file  Transportation Needs: Not on file  Physical Activity: Not on file  Stress: Not on file  Social Connections: Not on file  Intimate Partner Violence: Not on file   Review of Systems No fever No N/V Eating okay No change in chronic back pain     Objective:   Physical Exam Constitutional:      Appearance: Normal appearance.  Abdominal:     Palpations: Abdomen is soft.     Tenderness: There is no abdominal tenderness. There is no right CVA tenderness or left CVA tenderness.  Genitourinary:    Testes: Normal.     Comments: Normal size prostate No tenderness  Scrotum is quiet--no inflammation Neurological:     Mental Status: He is alert.           Assessment & Plan:

## 2021-10-28 NOTE — Addendum Note (Signed)
Addended by: Pilar Grammes on: 10/28/2021 08:42 AM   Modules accepted: Orders

## 2021-10-29 LAB — URINE CULTURE
MICRO NUMBER:: 12780104
SPECIMEN QUALITY:: ADEQUATE

## 2021-10-30 NOTE — Patient Instructions (Addendum)
DUE TO COVID-19 ONLY ONE VISITOR IS ALLOWED TO COME WITH YOU AND STAY IN THE WAITING ROOM ONLY DURING PRE OP AND PROCEDURE.   **NO VISITORS ARE ALLOWED IN THE SHORT STAY AREA OR RECOVERY ROOM!!**  IF YOU WILL BE ADMITTED INTO THE HOSPITAL YOU ARE ALLOWED ONLY TWO SUPPORT PEOPLE DURING VISITATION HOURS ONLY (7 AM -8PM)   The support person(s) must pass our screening, gel in and out, and wear a mask at all times, including in the patients room. Patients must also wear a mask when staff or their support person are in the room. Visitors GUEST BADGE MUST BE WORN VISIBLY  One adult visitor may remain with you overnight and MUST be in the room by 8 P.M.  No visitors under the age of 26. Any visitor under the age of 69 must be accompanied by an adult.        Your procedure is scheduled on: 11/19/21   Report to [facility] Main Entrance    Report to admitting at : 6:00 AM   Call this number if you have problems the morning of surgery [phone]   Do not eat food :After Midnight.   May have liquids until : 5:30 am   day of surgery  CLEAR LIQUID DIET  Foods Allowed                                                                     Foods Excluded  Water, Black Coffee and tea, regular and decaf                             liquids that you cannot  Plain Jell-O in any flavor  (No red)                                           see through such as: Fruit ices (not with fruit pulp)                                     milk, soups, orange juice              Iced Popsicles (No red)                                    All solid food                                   Apple juices Sports drinks like Gatorade (No red) Lightly seasoned clear broth or consume(fat free) Sugar  Sample Menu Breakfast                                Lunch  Supper Cranberry juice                    Beef broth                            Chicken broth Jell-O                                      Grape juice                           Apple juice Coffee or tea                        Jell-O                                      Popsicle                                                Coffee or tea                        Coffee or tea    Oral Hygiene is also important to reduce your risk of infection.                                    Remember - BRUSH YOUR TEETH THE MORNING OF SURGERY WITH YOUR REGULAR TOOTHPASTE   Do NOT smoke after Midnight   Take these medicines the morning of surgery with A SIP OF WATER: Paxil,Cardura,carvedilol,Nexium,Prilosec,bactrim How to Manage Your Diabetes Before and After Surgery  Why is it important to control my blood sugar before and after surgery? Improving blood sugar levels before and after surgery helps healing and can limit problems. A way of improving blood sugar control is eating a healthy diet by:  Eating less sugar and carbohydrates  Increasing activity/exercise  Talking with your doctor about reaching your blood sugar goals High blood sugars (greater than 180 mg/dL) can raise your risk of infections and slow your recovery, so you will need to focus on controlling your diabetes during the weeks before surgery. Make sure that the doctor who takes care of your diabetes knows about your planned surgery including the date and location.  How do I manage my blood sugar before surgery? Check your blood sugar at least 4 times a day, starting 2 days before surgery, to make sure that the level is not too high or low. Check your blood sugar the morning of your surgery when you wake up and every 2 hours until you get to the Short Stay unit. If your blood sugar is less than 70 mg/dL, you will need to treat for low blood sugar: Do not take insulin. Treat a low blood sugar (less than 70 mg/dL) with  cup of clear juice (cranberry or apple), 4 glucose tablets, OR glucose gel. Recheck blood sugar in 15 minutes after treatment (to make sure it is  greater than 70 mg/dL). If your blood sugar is  not greater than 70 mg/dL on recheck, call [phone] for further instructions. Report your blood sugar to the short stay nurse when you get to Short Stay.  If you are admitted to the hospital after surgery: Your blood sugar will be checked by the staff and you will probably be given insulin after surgery (instead of oral diabetes medicines) to make sure you have good blood sugar levels. The goal for blood sugar control after surgery is 80-180 mg/dL.   WHAT DO I DO ABOUT MY DIABETES MEDICATION?  Do not take oral diabetes medicines (pills) the morning of surgery.  THE DAY BEFORE SURGERY, take metformin as usual.      THE MORNING OF SURGERY, DO NOT TAKE ANY ORAL DIABETIC MEDICATIONS DAY OF YOUR SURGERY                              You may not have any metal on your body including hair pins, jewelry, and body piercing             Do not wear lotions, powders, perfumes/cologne, or deodorant              Men may shave face and neck.   Do not bring valuables to the hospital. [place].   Contacts, dentures or bridgework may not be worn into surgery.   Bring small overnight bag day of surgery.    Patients discharged on the day of surgery will not be allowed to drive home.   Special Instructions: Bring a copy of your healthcare power of attorney and living will documents         the day of surgery if you haven't scanned them before.              Please read over the following fact sheets you were given: IF YOU HAVE QUESTIONS ABOUT YOUR PRE-OP INSTRUCTIONS PLEASE CALL [phone]     [facility] - Preparing for Surgery Before surgery, you can play an important role.  Because skin is not sterile, your skin needs to be as free of germs as possible.  You can reduce the number of germs on your skin by washing with CHG (chlorahexidine gluconate) soap before surgery.  CHG is an antiseptic cleaner  which kills germs and bonds with the skin to continue killing germs even after washing. Please DO NOT use if you have an allergy to CHG or antibacterial soaps.  If your skin becomes reddened/irritated stop using the CHG and inform your nurse when you arrive at Short Stay. Do not shave (including legs and underarms) for at least 48 hours prior to the first CHG shower.  You may shave your face/neck. Please follow these instructions carefully:  1.  Shower with CHG Soap the night before surgery and the  morning of Surgery.  2.  If you choose to wash your hair, wash your hair first as usual with your  normal  shampoo.  3.  After you shampoo, rinse your hair and body thoroughly to remove the  shampoo.                           4.  Use CHG as you would any other liquid soap.  You can apply chg directly  to the skin and wash  Gently with a scrungie or clean washcloth.  5.  Apply the CHG Soap to your body ONLY FROM THE NECK DOWN.   Do not use on face/ open                           Wound or open sores. Avoid contact with eyes, ears mouth and genitals (private parts).                       Wash face,  Genitals (private parts) with your normal soap.             6.  Wash thoroughly, paying special attention to the area where your surgery  will be performed.  7.  Thoroughly rinse your body with warm water from the neck down.  8.  DO NOT shower/wash with your normal soap after using and rinsing off  the CHG Soap.                9.  Pat yourself dry with a clean towel.            10.  Wear clean pajamas.            11.  Place clean sheets on your bed the night of your first shower and do not  sleep with pets. Day of Surgery : Do not apply any lotions/deodorants the morning of surgery.  Please wear clean clothes to the hospital/surgery center.  FAILURE TO FOLLOW THESE INSTRUCTIONS MAY RESULT IN THE CANCELLATION OF YOUR SURGERY PATIENT SIGNATURE_________________________________  NURSE  SIGNATURE__________________________________  ________________________________________________________________________  [facility]- Preparing for Total Shoulder Arthroplasty    Before surgery, you can play an important role. Because skin is not sterile, your skin needs to be as free of germs as possible. You can reduce the number of germs on your skin by using the following products. Benzoyl Peroxide Gel Reduces the number of germs present on the skin Applied twice a day to shoulder area starting two days before surgery    ==================================================================  Please follow these instructions carefully:  BENZOYL PEROXIDE 5% GEL  Please do not use if you have an allergy to benzoyl peroxide.   If your skin becomes reddened/irritated stop using the benzoyl peroxide.  Starting two days before surgery, apply as follows: Apply benzoyl peroxide in the morning and at night. Apply after taking a shower. If you are not taking a shower clean entire shoulder front, back, and side along with the armpit with a clean wet washcloth.  Place a quarter-sized dollop on your shoulder and rub in thoroughly, making sure to cover the front, back, and side of your shoulder, along with the armpit.   2 days before ____ AM   ____ PM              1 day before ____ AM   ____ PM                         Do this twice a day for two days.  (Last application is the night before surgery, AFTER using the CHG soap as described below).  Do NOT apply benzoyl peroxide gel on the day of surgery.

## 2021-10-31 ENCOUNTER — Other Ambulatory Visit: Payer: Self-pay

## 2021-10-31 ENCOUNTER — Encounter (HOSPITAL_COMMUNITY)
Admission: RE | Admit: 2021-10-31 | Discharge: 2021-10-31 | Disposition: A | Discharge status: Home / Self Care | Payer: Medicare HMO | Source: Ambulatory Visit | Attending: Orthopaedic Surgery | Admitting: Orthopaedic Surgery

## 2021-10-31 ENCOUNTER — Encounter (HOSPITAL_COMMUNITY): Payer: Self-pay

## 2021-10-31 VITALS — BP 122/83 | HR 101 | Temp 97.9°F | Ht 70.0 in | Wt 224.0 lb

## 2021-10-31 DIAGNOSIS — Z01818 Encounter for other preprocedural examination: Secondary | ICD-10-CM

## 2021-10-31 DIAGNOSIS — E119 Type 2 diabetes mellitus without complications: Secondary | ICD-10-CM | POA: Insufficient documentation

## 2021-10-31 DIAGNOSIS — Z01812 Encounter for preprocedural laboratory examination: Secondary | ICD-10-CM | POA: Insufficient documentation

## 2021-10-31 HISTORY — DX: Anxiety disorder, unspecified: F41.9

## 2021-10-31 LAB — BASIC METABOLIC PANEL
Anion gap: 8 (ref 5–15)
BUN: 17 mg/dL (ref 8–23)
CO2: 23 mmol/L (ref 22–32)
Calcium: 9 mg/dL (ref 8.9–10.3)
Chloride: 105 mmol/L (ref 98–111)
Creatinine, Ser: 0.93 mg/dL (ref 0.61–1.24)
GFR, Estimated: >60 mL/min (ref >60)
Glucose, Bld: 155 mg/dL — ABNORMAL HIGH (ref 70–99)
Potassium: 5 mmol/L (ref 3.5–5.1)
Sodium: 136 mmol/L (ref 135–145)

## 2021-10-31 LAB — HEMOGLOBIN A1C
Hgb A1c MFr Bld: 7.4 % — ABNORMAL HIGH (ref 4.8–5.6)
Mean Plasma Glucose: 165.68 mg/dL

## 2021-10-31 LAB — GLUCOSE, CAPILLARY: Glucose-Capillary: 160 mg/dL — ABNORMAL HIGH (ref 70–99)

## 2021-10-31 LAB — CBC
HCT: 39.6 % (ref 39.0–52.0)
Hemoglobin: 13.3 g/dL (ref 13.0–17.0)
MCH: 31.1 pg (ref 26.0–34.0)
MCHC: 33.6 g/dL (ref 30.0–36.0)
MCV: 92.5 fL (ref 80.0–100.0)
Platelets: 258 10*3/uL (ref 150–400)
RBC: 4.28 MIL/uL (ref 4.22–5.81)
RDW: 12.6 % (ref 11.5–15.5)
WBC: 8.3 10*3/uL (ref 4.0–10.5)
nRBC: 0 % (ref 0.0–0.2)

## 2021-10-31 LAB — SURGICAL PCR SCREEN
MRSA, PCR: NEGATIVE
Staphylococcus aureus: NEGATIVE

## 2021-10-31 NOTE — Progress Notes (Signed)
COVID Vaccine Completed: Yes Date COVID Vaccine completed: 09/15/20 x 3 COVID vaccine manufacturer:  Moderna    COVID Test: N/A PCP - Dr. Elsie Stain Cardiologist - Dr. [name]. Clearance: Dayna Dunn: PA: 08/13/21: EPIC  Chest x-ray -  EKG - 04/22/21 Stress Test -  ECHO - 06/13/21 Cardiac Cath -  Pacemaker/ICD device last checked:  Sleep Study - Yes CPAP - NO  Fasting Blood Sugar - 150's Checks Blood Sugar _1_ times a day  Blood Thinner Instructions: Aspirin Instructions: Last Dose:  Anesthesia review: Hx: DIA,HTN,Cardiomyopathy,OSA(NO CPAP)  Patient denies shortness of breath, fever, cough and chest pain at PAT appointment   Patient verbalized understanding of instructions that were given to them at the PAT appointment. Patient was also instructed that they will need to review over the PAT instructions again at home before surgery.

## 2021-11-06 ENCOUNTER — Ambulatory Visit: Payer: Medicare HMO | Admitting: Family Medicine

## 2021-11-10 NOTE — Anesthesia Preprocedure Evaluation (Addendum)
Anesthesia Evaluation  Patient identified by MRN, date of birth, ID band Patient awake    Reviewed: Allergy & Precautions, NPO status , Patient's Chart, lab work & pertinent test results  Airway Mallampati: I  TM Distance: >3 FB Neck ROM: Full    Dental  (+) Teeth Intact, Caps   Pulmonary sleep apnea , former smoker,    Pulmonary exam normal        Cardiovascular hypertension, Pt. on home beta blockers and Pt. on medications +CHF   Rhythm:Regular Rate:Normal  Echo:  1. Left ventricular ejection fraction, by estimation, is 50 to 55%. The  left ventricle has low normal function. The left ventricle has no regional  wall motion abnormalities. Left ventricular diastolic parameters are  consistent with Grade I diastolic  dysfunction (impaired relaxation).  2. Right ventricular systolic function is normal. The right ventricular  size is normal. There is mildly elevated pulmonary artery systolic  pressure. The estimated right ventricular systolic pressure is 50.5 mmHg.  3. Left atrial size was mildly dilated.  4. The mitral valve is normal in structure. Mild mitral valve  regurgitation.  5. Tricuspid valve regurgitation is mild to moderate.    Neuro/Psych PSYCHIATRIC DISORDERS Anxiety Depression    GI/Hepatic Neg liver ROS, GERD  Medicated,  Endo/Other  diabetes, Type 2, Oral Hypoglycemic Agents  Renal/GU Renal disease     Musculoskeletal  (+) Arthritis ,   Abdominal Normal abdominal exam  (+)   Peds  Hematology   Anesthesia Other Findings   Reproductive/Obstetrics                           Anesthesia Physical Anesthesia Plan  ASA: 2  Anesthesia Plan: General   Post-op Pain Management: Regional block   Induction: Intravenous  PONV Risk Score and Plan: 3 and Ondansetron, Midazolam and Dexamethasone  Airway Management Planned: Oral ETT  Additional Equipment: None  Intra-op Plan:    Post-operative Plan: Extubation in OR  Informed Consent: I have reviewed the patients History and Physical, chart, labs and discussed the procedure including the risks, benefits and alternatives for the proposed anesthesia with the patient or authorized representative who has indicated his/her understanding and acceptance.     Dental advisory given  Plan Discussed with: CRNA  Anesthesia Plan Comments: (See PAT note 10/31/2021, Konrad Felix Ward, PA-C)      Anesthesia Quick Evaluation

## 2021-11-10 NOTE — Progress Notes (Signed)
Anesthesia Chart Review   Case: [id number] Date/Time: 11/19/21 0815   Procedure: REVERSE SHOULDER ARTHROPLASTY (Left: Shoulder)   Anesthesia type: Choice   Pre-op diagnosis: djd left shoulder   Location: [facility] ROOM 06 / [facility]   Surgeons: Hiram Gash, MD       DISCUSSION:70 y.o. former smoker with h/o HTN, DM II (A1C 7.4), BPH, cardiomyopathy, VSD, OSA, djd left shoulder scheduled for above procedure 11/19/21 with Dr. Ophelia Charter.   Per cardiology preoperative evaluation 08/13/2021, "Chart reviewed as part of pre-operative protocol coverage. Patient was contacted 08/13/2021 in reference to pre-operative risk assessment for pending surgery as outlined below.  Tim Mccullough was last seen 04/2021 by Dr. Caryl Comes, chart reviewed. Primary cardiac issues have been NICM, PACs, PVCs, VSD (referenced as small hemodynamically insignificant VSD per MD note), mild internal carotid artery disease. Cath 2021 showed normal coronary arteries. Last echo 06/2021 showed substantial improvement in EF to 50-55% (previously 30-35%). RCRI is 0.9% for CHF indicating low CV risk overall. I reached out to patient for update on how he is doing. The patient affirms he has been doing well without any new cardiac symptoms. Able to exceed 4 METS without any functional cardiac limitation/symptoms. States, "I can do just about everything I want to except lift with that left arm because of my shoulder." Therefore, based on ACC/AHA guidelines, the patient would be at acceptable risk for the planned procedure without further cardiovascular testing. The patient was advised that if he develops new symptoms prior to surgery to contact our office to arrange for a follow-up visit, and he verbalized understanding.   Would typically suggest to continue aspirin when able, but he has no prior hx of stents, bypass, TIA or stroke therefore OK to hold ASA if needed for procedure (usually held 5-7 days but will defer to surgeon for final decision). I did  confer with Dr. Fletcher Anon to ensure there was no issue holding ASA with prior h/o small VSD and he agrees."  Anticipate pt can proceed with planned procedure barring acute status change.   VS: BP 122/83    Pulse (!) 101    Temp 36.6 C (Oral)    Ht 5\' 10"  (1.778 m)    Wt 101.6 kg    SpO2 96%    BMI 32.14 kg/m   PROVIDERS: Tonia Ghent, MD is PCP   Primary Cardiologist: [name], MD, EP - Dr. Caryl Comes LABS: Labs reviewed: Acceptable for surgery. (all labs ordered are listed, but only abnormal results are displayed)  Labs Reviewed  BASIC METABOLIC PANEL - Abnormal; Notable for the following components:      Result Value   Glucose, Bld 155 (*)    All other components within normal limits  HEMOGLOBIN A1C - Abnormal; Notable for the following components:   Hgb A1c MFr Bld 7.4 (*)    All other components within normal limits  GLUCOSE, CAPILLARY - Abnormal; Notable for the following components:   Glucose-Capillary 160 (*)    All other components within normal limits  SURGICAL PCR SCREEN  CBC     IMAGES:   EKG: 04/22/2021 Rate 76 bpm  NSR RBBB  CV: Echo 06/13/2021 1. Left ventricular ejection fraction, by estimation, is 50 to 55%. The  left ventricle has low normal function. The left ventricle has no regional  wall motion abnormalities. Left ventricular diastolic parameters are  consistent with Grade I diastolic  dysfunction (impaired relaxation).   2. Right ventricular systolic function is normal. The  right ventricular  size is normal. There is mildly elevated pulmonary artery systolic  pressure. The estimated right ventricular systolic pressure is 35.3 mmHg.   3. Left atrial size was mildly dilated.   4. The mitral valve is normal in structure. Mild mitral valve  regurgitation.   5. Tricuspid valve regurgitation is mild to moderate.   Cardiac Cath 12/25/2019 1.  Normal coronary arteries. 2.  Right heart catheterization showed mildly elevated filling pressures (PCW of  14 mmHg), mild pulmonary hypertension (37/21 mmHg) and mildly reduced cardiac output at 4.69 with a cardiac index of 2.09.   Recommendations: Continue medical therapy for nonischemic cardiomyopathy. Consider EP evaluation for treatment of PVCs which might be contributing to worsening cardiomyopathy. Past Medical History:  Diagnosis Date   Abnormal echocardiogram 12/2009   Repeated after med mgmt of cardiomyopathy showed EF 40-45%, mildly dilated LV, mild LV hypertrophy, anterolateral and apical hypokinesis, mild diastolic dysfunction.   Anemia    hx   Anxiety    Arthritis    Blood transfusion without reported diagnosis 09/2012   BPH (benign prostatic hypertrophy) 2004   Cancer [facility])    melanoma back   Cardiomyopathy    ? tachycardia-mediated   Depression    Diabetes mellitus 11/2006   Type II   Diverticulosis    Echocardiogram abnormal 05/2009   Mild LVH with a mildly dilated LV.  EF was difficult to assess given poor acoustic windows but appeared moderately decreased.  Mild MR.  Probable small perimembranous VSD, mild LAE   Esophageal erosions    GERD (gastroesophageal reflux disease)    H/O cardiac radiofrequency ablation    Hip pain    History of MRI 05/2009   Cardiac MRI was done to followup difficult echo.  This showed mild to moderately dilated LV, mild to moderate LAE, EF  33% with global hypokinesis, normal RV size with mild systolic dysfunction.  There was no large VSD evident.  There was no myocardial delayed enhancement so no evidence for infiltrative disease or infarction.  LHC (7/10) showed only luminal irreg. in the coronaries and EF 35%   Holter monitor, abnormal 05/2009   Frequent PAC's and PVC's  Average HR 96  (but he had started Toprol XL).  HR range 71-130   Hyperlipidemia 04/1998   Hypertension 1995   Obesity    OSA (obstructive sleep apnea)    Not using CPAP- improved with weight loss   Pneumonia    HX OF pna   Sinus tachycardia    Pt says he has been  told his heart rate is high since he was in high school.    Sleep apnea    has cpap; but does not use   VSD (ventricular septal defect)    Probable small perimembranous    Past Surgical History:  Procedure Laterality Date   CARDIAC CATHETERIZATION  05/30/2009   Dilated cardiomyopathy.  min VSD.  Hypokin Inf  wall.  Multiple min luminal Irr.  EF 35%   CATARACT EXTRACTION, BILATERAL     COLONOSCOPY W/ BIOPSIES  08/24/2006   Divertics, polyps x3, bx negative, repeat 2012 with mild diverticulosis in teh sigmoid colon and 1 polyp in tranverse colon, repeat due 2017   ESOPHAGOGASTRODUODENOSCOPY  2012   1)Barrett's esoph, 2) Erosive esophagitis, 3) Mild gastritis in the antrum, 4) Duodenitis in the bulb of duodenum, 5) Small hiatal hernia   INGUINAL HERNIA REPAIR  1985   Left   INGUINAL HERNIA REPAIR  1999   Right  KNEE ARTHROSCOPY  10/15/2011   Procedure: ARTHROSCOPY KNEE;  Surgeon: Ninetta Lights, MD;  Location: [place];  Service: Orthopedics;  Laterality: Right;  right knee scope with lateral meniscectomy, removal loose foreign body, and microfracture technique   LAMINECTOMY  1987   with discectomy   PVC ABLATION N/A 03/18/2020   Procedure: PVC ABLATION;  Surgeon: Evans Lance, MD;  Location: [place] [facility];  Service: Cardiovascular;  Laterality: N/A;   RIGHT/LEFT HEART CATH AND CORONARY ANGIOGRAPHY N/A 12/25/2019   Procedure: RIGHT/LEFT HEART CATH AND CORONARY ANGIOGRAPHY;  Surgeon: Wellington Hampshire, MD;  Location: [place] [facility];  Service: Cardiovascular;  Laterality: N/A;   ROTATOR CUFF REPAIR  2/11   rt   TOTAL HIP ARTHROPLASTY  2011   Left   TOTAL KNEE ARTHROPLASTY  06/01/2012   Procedure: TOTAL KNEE ARTHROPLASTY;  Surgeon: Ninetta Lights, MD;  Location: [place];  Service: Orthopedics;  Laterality: Right;    MEDICATIONS:  acetaminophen (TYLENOL) 650 MG CR tablet   aspirin 81 MG tablet   atorvastatin (LIPITOR) 40 MG tablet   carvedilol (COREG) 12.5  MG tablet   doxazosin (CARDURA) 1 MG tablet   esomeprazole (NEXIUM) 20 MG capsule   fish oil-omega-3 fatty acids 1000 MG capsule   glucose blood test strip   losartan (COZAAR) 25 MG tablet   metFORMIN (GLUCOPHAGE) 500 MG tablet   Naphazoline-Pheniramine (OPCON-A) 0.027-0.315 % SOLN   omeprazole (PRILOSEC) 20 MG capsule   PARoxetine (PAXIL) 20 MG tablet   spironolactone (ALDACTONE) 25 MG tablet   sulfamethoxazole-trimethoprim (BACTRIM DS) 800-160 MG tablet   No current facility-administered medications for this encounter.    Konrad Felix Ward, PA-C [facility] [phone]

## 2021-11-14 NOTE — H&P (Signed)
PREOPERATIVE H&P  Chief Complaint: djd left shoulder  HPI: Tim Mccullough is a 71 y.o. male who is scheduled for Procedure(s): REVERSE SHOULDER ARTHROPLASTY.   Patient has a past medical history significant for HTN, HLD, GERD, OSA, DM, cardiomyopathy.   The patient is a 71 year old who has had multiple years of left shoulder pain.  He saw Dr. Percell Miller and there was some concern for needing a reverse total shoulder arthroplasty.  He sent the patient to [place] for further evaluation.  He finds that his shoulder is still quite bothersome.  He has reasonable motion, but he has no strength overhead.  He is unhappy with his function.  It did wake him from sleep prior to his injection with Dr. Percell Miller.    His symptoms are rated as moderate to severe, and have been worsening.  This is significantly impairing activities of daily living.    Please see clinic note for further details on this patient's care.    He has elected for surgical management.   Past Medical History:  Diagnosis Date   Abnormal echocardiogram 12/2009   Repeated after med mgmt of cardiomyopathy showed EF 40-45%, mildly dilated LV, mild LV hypertrophy, anterolateral and apical hypokinesis, mild diastolic dysfunction.   Anemia    hx   Anxiety    Arthritis    Blood transfusion without reported diagnosis 09/2012   BPH (benign prostatic hypertrophy) 2004   Cancer [facility])    melanoma back   Cardiomyopathy    ? tachycardia-mediated   Depression    Diabetes mellitus 11/2006   Type II   Diverticulosis    Echocardiogram abnormal 05/2009   Mild LVH with a mildly dilated LV.  EF was difficult to assess given poor acoustic windows but appeared moderately decreased.  Mild MR.  Probable small perimembranous VSD, mild LAE   Esophageal erosions    GERD (gastroesophageal reflux disease)    H/O cardiac radiofrequency ablation    Hip pain    History of MRI 05/2009   Cardiac MRI was done to followup difficult echo.  This showed  mild to moderately dilated LV, mild to moderate LAE, EF  33% with global hypokinesis, normal RV size with mild systolic dysfunction.  There was no large VSD evident.  There was no myocardial delayed enhancement so no evidence for infiltrative disease or infarction.  LHC (7/10) showed only luminal irreg. in the coronaries and EF 35%   Holter monitor, abnormal 05/2009   Frequent PAC's and PVC's  Average HR 96  (but he had started Toprol XL).  HR range 71-130   Hyperlipidemia 04/1998   Hypertension 1995   Obesity    OSA (obstructive sleep apnea)    Not using CPAP- improved with weight loss   Pneumonia    HX OF pna   Sinus tachycardia    Pt says he has been told his heart rate is high since he was in high school.    Sleep apnea    has cpap; but does not use   VSD (ventricular septal defect)    Probable small perimembranous   Past Surgical History:  Procedure Laterality Date   CARDIAC CATHETERIZATION  05/30/2009   Dilated cardiomyopathy.  min VSD.  Hypokin Inf  wall.  Multiple min luminal Irr.  EF 35%   CATARACT EXTRACTION, BILATERAL     COLONOSCOPY W/ BIOPSIES  08/24/2006   Divertics, polyps x3, bx negative, repeat 2012 with mild diverticulosis in teh sigmoid colon and 1 polyp in tranverse  colon, repeat due 2017   ESOPHAGOGASTRODUODENOSCOPY  2012   1)Barrett's esoph, 2) Erosive esophagitis, 3) Mild gastritis in the antrum, 4) Duodenitis in the bulb of duodenum, 5) Small hiatal hernia   INGUINAL HERNIA REPAIR  1985   Left   INGUINAL HERNIA REPAIR  1999   Right   KNEE ARTHROSCOPY  10/15/2011   Procedure: ARTHROSCOPY KNEE;  Surgeon: Ninetta Lights, MD;  Location: [place];  Service: Orthopedics;  Laterality: Right;  right knee scope with lateral meniscectomy, removal loose foreign body, and microfracture technique   LAMINECTOMY  1987   with discectomy   PVC ABLATION N/A 03/18/2020   Procedure: PVC ABLATION;  Surgeon: Evans Lance, MD;  Location: [place] [facility];   Service: Cardiovascular;  Laterality: N/A;   RIGHT/LEFT HEART CATH AND CORONARY ANGIOGRAPHY N/A 12/25/2019   Procedure: RIGHT/LEFT HEART CATH AND CORONARY ANGIOGRAPHY;  Surgeon: Wellington Hampshire, MD;  Location: [place] [facility];  Service: Cardiovascular;  Laterality: N/A;   ROTATOR CUFF REPAIR  2/11   rt   TOTAL HIP ARTHROPLASTY  2011   Left   TOTAL KNEE ARTHROPLASTY  06/01/2012   Procedure: TOTAL KNEE ARTHROPLASTY;  Surgeon: Ninetta Lights, MD;  Location: [place];  Service: Orthopedics;  Laterality: Right;   Social History   Socioeconomic History   Marital status: Single    Spouse name: Not on file   Number of children: 3   Years of education: Not on file   Highest education level: Not on file  Occupational History   Occupation: Systems analyst: Center   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  Tobacco Use   Smoking status: Former    Years: 20.00    Types: Cigarettes    Quit date: 11/09/1998    Years since quitting: 23.0   Smokeless tobacco: Former    Quit date: 06/03/2007   Tobacco comments:    no alcohol since nov 12  Vaping Use   Vaping Use: Never used  Substance and Sexual Activity   Alcohol use: No    Alcohol/week: 0.0 standard drinks    Comment: sober as 09/2011   Drug use: No   Sexual activity: Not Currently  Other Topics Concern   Not on file  Social History Narrative   Lives in [place], lives alone   3 kids (2 sons, 1 daughter)   Divorced   Social Determinants of Health   Financial Resource Strain: Not on file  Food Insecurity: Not on file  Transportation Needs: Not on file  Physical Activity: Not on file  Stress: Not on file  Social Connections: Not on file   Family History  Problem Relation Age of Onset   Heart disease Father        CABG   Stroke Father    Diabetes Father    Hypertension Mother    Alzheimer's disease Mother    Diabetes Sister    Hypertension Sister    Cancer Sister        uterine cancer   Diabetes Brother     Throat cancer Brother        Throat   Esophageal cancer Brother    Diabetes Sister    Hypertension Sister    Throat cancer Other        Throat   Diabetes Other    Alcohol abuse Other    Diabetes Maternal Grandmother        Insulin   Colon cancer Cousin  Esophageal cancer Maternal Grandfather    Drug abuse Neg Hx    Prostate cancer Neg Hx    Rectal cancer Neg Hx    No Known Allergies Prior to Admission medications   Medication Sig Start Date End Date Taking? Authorizing Provider  acetaminophen (TYLENOL) 650 MG CR tablet Take 1,300 mg by mouth every 8 (eight) hours as needed for pain.   Yes [provider]  atorvastatin (LIPITOR) 40 MG tablet Take 1 tablet (40 mg total) by mouth daily. 03/03/21  Yes Tonia Ghent, MD  carvedilol (COREG) 12.5 MG tablet TAKE 1 TABLET BY MOUTH 2 TIMES DAILY. 02/18/21  Yes Deboraha Sprang, MD  doxazosin (CARDURA) 1 MG tablet Take 1 tablet (1 mg total) by mouth daily. 03/03/21  Yes Tonia Ghent, MD  esomeprazole (NEXIUM) 20 MG capsule Take 20 mg by mouth daily at 12 noon.   Yes [provider]  fish oil-omega-3 fatty acids 1000 MG capsule Take 1 g by mouth 2 (two) times daily.    Yes [provider]  losartan (COZAAR) 25 MG tablet TAKE 1 TABLET (25 MG) BY MOUTH ONCE DAILY AT BEDTIME 02/18/21  Yes Deboraha Sprang, MD  metFORMIN (GLUCOPHAGE) 500 MG tablet Take 2 tablets (1,000 mg total) by mouth 2 (two) times daily with a meal. 03/03/21  Yes Tonia Ghent, MD  Naphazoline-Pheniramine (OPCON-A) 0.027-0.315 % SOLN Place 1 drop into both eyes daily.   Yes [provider]  PARoxetine (PAXIL) 20 MG tablet Take 1 tablet (20 mg total) by mouth daily. 03/03/21  Yes Tonia Ghent, MD  spironolactone (ALDACTONE) 25 MG tablet TAKE 1/2 TABLET BY MOUTH EVERY DAY 02/18/21  Yes Deboraha Sprang, MD  aspirin 81 MG tablet Take 81 mg by mouth daily.     [provider]  glucose blood test strip Use daily to check sugar.  E11.9.  One touch test strips. 03/03/21   Tonia Ghent, MD  omeprazole (PRILOSEC) 20 MG capsule Take 1 capsule (20 mg total) by mouth daily. 09/28/19   Ladene Artist, MD  sulfamethoxazole-trimethoprim (BACTRIM DS) 800-160 MG tablet Take 1 tablet by mouth 2 (two) times daily. 10/28/21   Venia Carbon, MD    ROS: All other systems have been reviewed and were otherwise negative with the exception of those mentioned in the HPI and as above.  Physical Exam: General: Alert, no acute distress Cardiovascular: No pedal edema Respiratory: No cyanosis, no use of accessory musculature GI: No organomegaly, abdomen is soft and non-tender Skin: No lesions in the area of chief complaint Neurologic: Sensation intact distally Psychiatric: Patient is competent for consent with normal mood and affect Lymphatic: No axillary or cervical lymphadenopathy  MUSCULOSKELETAL:  Range of motion of the left shoulder to 120 degrees, passive to 140.  External rotation to 30, internal rotation to L3.    Imaging: MRI reviewed demonstrating a 5 cm retracted cuff tear.  There are no obvious signs of superior migration on the x-rays; however, the MRI does look like he is starting to migrate proximally, though there is no gravity in these films.   Assessment: djd left shoulder  Plan: Plan for Procedure(s): REVERSE SHOULDER ARTHROPLASTY  The risks benefits and alternatives were discussed with the patient including but not limited to the risks of nonoperative treatment, versus surgical intervention including infection, bleeding, nerve injury,  blood clots, cardiopulmonary complications, morbidity, mortality, among others, and they were willing to proceed.   We additionally specifically discussed  risks of axillary nerve injury, infection, periprosthetic fracture, continued pain and longevity of implants prior to beginning procedure.    Patient will be closely monitored in PACU for medical stabilization and pain  control. If found stable in PACU, patient may be discharged home with outpatient follow-up. If any concerns regarding patient's stabilization patient will be admitted for observation after surgery. The patient is planning to be discharged home with outpatient PT.   The patient acknowledged the explanation, agreed to proceed with the plan and consent was signed.   He received operative clearance from his PCP, Dr. Elsie Stain, and his cardiologist, Dr. [name].   Operative Plan: Left reverse total shoulder arthroplasty  Discharge Medications: Standard DVT Prophylaxis: Aspirin  Physical Therapy: Outpatient PT Special Discharge needs: [place]. [place], PA-C  11/14/2021 1:30 PM

## 2021-11-18 NOTE — Discharge Instructions (Signed)
Ophelia Charter MD, MPH Noemi Chapel, PA-C [place] [address] [phone] (tel)   [phone] (fax)   [place] may leave the operative dressing in place until your follow-up appointment. KEEP THE INCISIONS CLEAN AND DRY. There may be a small amount of fluid/bleeding leaking at the surgical site. This is normal after surgery.  If it fills with liquid or blood please call us immediately to change it for you. Use the provided ice machine or Ice packs as often as possible for the first 3-4 days, then as needed for pain relief.   Keep a layer of cloth or a shirt between your skin and the cooling unit to prevent frost bite as it can get very cold.  SHOWERING: - You may shower on Post-Op Day #2.  - The dressing is water resistant but do not scrub it as it may start to peel up.   - You may remove the sling for showering, but keep a water resistant pillow under the arm to keep both the  elbow and shoulder away from the body (mimicking the abduction sling).  - Gently pat the area dry.  - Do not soak the shoulder in water. Do not go swimming in the pool or ocean until your incision has completely healed (about 4-6 weeks after surgery) - KEEP THE INCISIONS CLEAN AND DRY.  EXERCISES Wear the sling at all times  You may remove the sling for showering, but keep the arm across the chest or in a secondary sling.    Accidental/Purposeful External Rotation and shoulder flexion (reaching behind you) is to be avoided at all costs for the first month. It is ok to come out of your sling if your are sitting and have assistance for eating.   Do not lift anything heavier than 1 pound until we discuss it further in clinic. You have a physical therapy appointment on Tuesday, January 17th @ 10 am  REGIONAL ANESTHESIA ([place]) The anesthesia team may have performed a nerve block for you if safe in  the setting of your care.  This is a great tool used to minimize pain.  Typically the block may start wearing off overnight but the long acting medicine may last for 3-4 days.  The nerve block wearing off can be a challenging period but please utilize your as needed pain medications to try and manage this period.    POST-OP MEDICATIONS- Multimodal approach to pain control In general your pain will be controlled with a combination of substances.  Prescriptions unless otherwise discussed are electronically sent to your pharmacy.  This is a carefully made plan we use to minimize narcotic use.     Meloxicam - Anti-inflammatory medication taken on a scheduled basis Acetaminophen - Non-narcotic pain medicine taken on a scheduled basis  Oxycodone - This is a strong narcotic, to be used only on an as needed basis for SEVERE pain. Aspirin 81mg  - This medicine is used to minimize the risk of blood clots after surgery. Zofran -  take as needed for nausea   FOLLOW-UP If you develop a Fever (>101.5), Redness or Drainage from the surgical incision site, please call our office to arrange for an evaluation. Please call the office to schedule a follow-up appointment for a wound check, 7-10 days post-operatively.  IF YOU HAVE ANY QUESTIONS, PLEASE FEEL FREE TO CALL OUR OFFICE.  HELPFUL INFORMATION  If you had a block,  it will wear off between 8-24 hrs postop typically.  This is period when your pain may go from nearly zero to the pain you would have had post-op without the block.  This is an abrupt transition but nothing dangerous is happening.  You may take an extra dose of narcotic when this happens.  Your arm will be in a sling following surgery. You will be in this sling for the next 4 weeks.  I will let you know the exact duration at your follow-up visit.  You may be more comfortable sleeping in a semi-seated position the first few nights following surgery.  Keep a pillow propped under the elbow and  forearm for comfort.  If you have a recliner type of chair it might be beneficial.  If not that is fine too, but it would be helpful to sleep propped up with pillows behind your operated shoulder as well under your elbow and forearm.  This will reduce pulling on the suture lines.  When dressing, put your operative arm in the sleeve first.  When getting undressed, take your operative arm out last.  Loose fitting, button-down shirts are recommended.  In most states it is against the law to drive while your arm is in a sling. And certainly against the law to drive while taking narcotics.  You may return to work/school in the next couple of days when you feel up to it. Desk work and typing in the sling is fine.  We suggest you use the pain medication the first night prior to going to bed, in order to ease any pain when the anesthesia wears off. You should avoid taking pain medications on an empty stomach as it will make you nauseous.  Do not drink alcoholic beverages or take illicit drugs when taking pain medications.  Pain medication may make you constipated.  Below are a few solutions to try in this order: Decrease the amount of pain medication if you arent having pain. Drink lots of decaffeinated fluids. Drink prune juice and/or each dried prunes  If the first 3 dont work start with additional solutions Take Colace - an over-the-counter stool softener Take Senokot - an over-the-counter laxative Take Miralax - a stronger over-the-counter laxative   Dental Antibiotics:  In most cases prophylactic antibiotics for Dental procdeures after total joint surgery are not necessary.  Exceptions are as follows:  1. History of prior total joint infection  2. Severely immunocompromised (Organ Transplant, cancer chemotherapy, Rheumatoid biologic meds such as Booker)  3. Poorly controlled diabetes (A1C &gt; 8.0, blood glucose over 200)  If you have one of these conditions, contact your surgeon  for an antibiotic prescription, prior to your dental procedure.   For more information including helpful videos and documents visit our website:   [email or web address]

## 2021-11-19 ENCOUNTER — Ambulatory Visit (HOSPITAL_COMMUNITY): Payer: Medicare HMO | Admitting: Certified Registered Nurse Anesthetist

## 2021-11-19 ENCOUNTER — Encounter (HOSPITAL_COMMUNITY): Payer: Self-pay | Admitting: Orthopaedic Surgery

## 2021-11-19 ENCOUNTER — Ambulatory Visit (HOSPITAL_COMMUNITY): Payer: Medicare HMO | Admitting: Physician Assistant

## 2021-11-19 ENCOUNTER — Ambulatory Visit (HOSPITAL_COMMUNITY)
Admission: RE | Admit: 2021-11-19 | Discharge: 2021-11-19 | Disposition: A | Discharge status: Home / Self Care | Payer: Medicare HMO | Source: Ambulatory Visit | Attending: Orthopaedic Surgery | Admitting: Orthopaedic Surgery

## 2021-11-19 ENCOUNTER — Encounter (HOSPITAL_COMMUNITY)
Admission: RE | Disposition: A | Discharge status: Home / Self Care | Payer: Self-pay | Source: Ambulatory Visit | Attending: Orthopaedic Surgery

## 2021-11-19 ENCOUNTER — Ambulatory Visit (HOSPITAL_COMMUNITY): Payer: Medicare HMO

## 2021-11-19 DIAGNOSIS — M75102 Unspecified rotator cuff tear or rupture of left shoulder, not specified as traumatic: Secondary | ICD-10-CM | POA: Insufficient documentation

## 2021-11-19 DIAGNOSIS — Z9889 Other specified postprocedural states: Secondary | ICD-10-CM | POA: Diagnosis not present

## 2021-11-19 DIAGNOSIS — G4733 Obstructive sleep apnea (adult) (pediatric): Secondary | ICD-10-CM | POA: Diagnosis not present

## 2021-11-19 DIAGNOSIS — I420 Dilated cardiomyopathy: Secondary | ICD-10-CM | POA: Diagnosis not present

## 2021-11-19 DIAGNOSIS — F32A Depression, unspecified: Secondary | ICD-10-CM | POA: Insufficient documentation

## 2021-11-19 DIAGNOSIS — Z7984 Long term (current) use of oral hypoglycemic drugs: Secondary | ICD-10-CM | POA: Diagnosis not present

## 2021-11-19 DIAGNOSIS — F419 Anxiety disorder, unspecified: Secondary | ICD-10-CM | POA: Insufficient documentation

## 2021-11-19 DIAGNOSIS — Z96642 Presence of left artificial hip joint: Secondary | ICD-10-CM | POA: Diagnosis not present

## 2021-11-19 DIAGNOSIS — M12812 Other specific arthropathies, not elsewhere classified, left shoulder: Secondary | ICD-10-CM | POA: Diagnosis not present

## 2021-11-19 DIAGNOSIS — M25712 Osteophyte, left shoulder: Secondary | ICD-10-CM | POA: Insufficient documentation

## 2021-11-19 DIAGNOSIS — Z09 Encounter for follow-up examination after completed treatment for conditions other than malignant neoplasm: Secondary | ICD-10-CM

## 2021-11-19 DIAGNOSIS — I5020 Unspecified systolic (congestive) heart failure: Secondary | ICD-10-CM | POA: Diagnosis not present

## 2021-11-19 DIAGNOSIS — R69 Illness, unspecified: Secondary | ICD-10-CM | POA: Diagnosis not present

## 2021-11-19 DIAGNOSIS — M19012 Primary osteoarthritis, left shoulder: Secondary | ICD-10-CM | POA: Diagnosis not present

## 2021-11-19 DIAGNOSIS — I071 Rheumatic tricuspid insufficiency: Secondary | ICD-10-CM | POA: Diagnosis not present

## 2021-11-19 DIAGNOSIS — G8918 Other acute postprocedural pain: Secondary | ICD-10-CM | POA: Diagnosis not present

## 2021-11-19 DIAGNOSIS — E119 Type 2 diabetes mellitus without complications: Secondary | ICD-10-CM | POA: Insufficient documentation

## 2021-11-19 DIAGNOSIS — Z87891 Personal history of nicotine dependence: Secondary | ICD-10-CM | POA: Diagnosis not present

## 2021-11-19 DIAGNOSIS — I110 Hypertensive heart disease with heart failure: Secondary | ICD-10-CM | POA: Diagnosis not present

## 2021-11-19 DIAGNOSIS — K219 Gastro-esophageal reflux disease without esophagitis: Secondary | ICD-10-CM | POA: Diagnosis not present

## 2021-11-19 DIAGNOSIS — Z471 Aftercare following joint replacement surgery: Secondary | ICD-10-CM | POA: Diagnosis not present

## 2021-11-19 HISTORY — PX: REVERSE SHOULDER ARTHROPLASTY: SHX5054

## 2021-11-19 LAB — GLUCOSE, CAPILLARY
Glucose-Capillary: 196 mg/dL — ABNORMAL HIGH (ref 70–99)
Glucose-Capillary: 146 mg/dL — ABNORMAL HIGH (ref 70–99)

## 2021-11-19 SURGERY — ARTHROPLASTY, SHOULDER, TOTAL, REVERSE
Anesthesia: General | Site: Shoulder | Laterality: Left

## 2021-11-19 MED ORDER — OXYCODONE HCL 5 MG PO TABS: ORAL_TABLET | ORAL | 0 refills | Status: AC

## 2021-11-19 MED ORDER — PHENYLEPHRINE 40 MCG/ML (10ML) SYRINGE FOR IV PUSH (FOR BLOOD PRESSURE SUPPORT)
PREFILLED_SYRINGE | INTRAVENOUS | Status: AC
  Filled 2021-11-19: qty 20

## 2021-11-19 MED ORDER — LACTATED RINGERS IV BOLUS
250.0000 mL | Freq: Once | INTRAVENOUS | Status: AC
  Administered 2021-11-19: 250 mL via INTRAVENOUS

## 2021-11-19 MED ORDER — GABAPENTIN 300 MG PO CAPS
300.0000 mg | ORAL_CAPSULE | Freq: Once | ORAL | Status: AC
  Administered 2021-11-19: 300 mg via ORAL
  Filled 2021-11-19: qty 1

## 2021-11-19 MED ORDER — ACETAMINOPHEN 10 MG/ML IV SOLN: 1000.0000 mg | Freq: Once | INTRAVENOUS | Status: DC | PRN

## 2021-11-19 MED ORDER — PROPOFOL 10 MG/ML IV BOLUS
INTRAVENOUS | Status: DC | PRN
  Administered 2021-11-19: 140 mg via INTRAVENOUS

## 2021-11-19 MED ORDER — PHENYLEPHRINE 40 MCG/ML (10ML) SYRINGE FOR IV PUSH (FOR BLOOD PRESSURE SUPPORT)
PREFILLED_SYRINGE | INTRAVENOUS | Status: DC | PRN
  Administered 2021-11-19 (×4): 80 ug via INTRAVENOUS

## 2021-11-19 MED ORDER — VANCOMYCIN HCL 1000 MG IV SOLR
INTRAVENOUS | Status: AC
  Filled 2021-11-19: qty 20

## 2021-11-19 MED ORDER — LACTATED RINGERS IV SOLN: INTRAVENOUS | Status: DC

## 2021-11-19 MED ORDER — ASPIRIN 81 MG PO CHEW: 81.0000 mg | CHEWABLE_TABLET | Freq: Two times a day (BID) | ORAL | 0 refills | Status: AC

## 2021-11-19 MED ORDER — MIDAZOLAM HCL 5 MG/5ML IJ SOLN
INTRAMUSCULAR | Status: DC | PRN
  Administered 2021-11-19: 2 mg via INTRAVENOUS

## 2021-11-19 MED ORDER — LACTATED RINGERS IV BOLUS
500.0000 mL | Freq: Once | INTRAVENOUS | Status: AC
  Administered 2021-11-19: 500 mL via INTRAVENOUS

## 2021-11-19 MED ORDER — ONDANSETRON HCL 4 MG/2ML IJ SOLN
INTRAMUSCULAR | Status: AC
  Filled 2021-11-19: qty 2

## 2021-11-19 MED ORDER — ACETAMINOPHEN 500 MG PO TABS
1000.0000 mg | ORAL_TABLET | Freq: Three times a day (TID) | ORAL | 0 refills | Status: AC
Start: 2021-11-19 — End: 2021-12-03

## 2021-11-19 MED ORDER — ORAL CARE MOUTH RINSE: 15.0000 mL | Freq: Once | OROMUCOSAL | Status: AC

## 2021-11-19 MED ORDER — HYDROMORPHONE HCL 1 MG/ML IJ SOLN: 0.2500 mg | INTRAMUSCULAR | Status: DC | PRN

## 2021-11-19 MED ORDER — MIDAZOLAM HCL 2 MG/2ML IJ SOLN
INTRAMUSCULAR | Status: AC
  Filled 2021-11-19: qty 2

## 2021-11-19 MED ORDER — TRANEXAMIC ACID-NACL 1000-0.7 MG/100ML-% IV SOLN
1000.0000 mg | INTRAVENOUS | Status: AC
  Administered 2021-11-19: 1000 mg via INTRAVENOUS
  Filled 2021-11-19: qty 100

## 2021-11-19 MED ORDER — EPHEDRINE 5 MG/ML INJ
INTRAVENOUS | Status: AC
  Filled 2021-11-19: qty 5

## 2021-11-19 MED ORDER — DEXAMETHASONE SODIUM PHOSPHATE 10 MG/ML IJ SOLN
INTRAMUSCULAR | Status: AC
  Filled 2021-11-19: qty 1

## 2021-11-19 MED ORDER — ONDANSETRON HCL 4 MG PO TABS: 4.0000 mg | ORAL_TABLET | Freq: Three times a day (TID) | ORAL | 0 refills | Status: AC | PRN

## 2021-11-19 MED ORDER — SUGAMMADEX SODIUM 200 MG/2ML IV SOLN
INTRAVENOUS | Status: DC | PRN
  Administered 2021-11-19: 200 mg via INTRAVENOUS

## 2021-11-19 MED ORDER — STERILE WATER FOR IRRIGATION IR SOLN
Status: DC | PRN
  Administered 2021-11-19: 2000 mL

## 2021-11-19 MED ORDER — PROPOFOL 10 MG/ML IV BOLUS
INTRAVENOUS | Status: AC
  Filled 2021-11-19: qty 20

## 2021-11-19 MED ORDER — PROMETHAZINE HCL 25 MG/ML IJ SOLN: 6.2500 mg | INTRAMUSCULAR | Status: DC | PRN

## 2021-11-19 MED ORDER — CHLORHEXIDINE GLUCONATE 0.12 % MT SOLN
15.0000 mL | Freq: Once | OROMUCOSAL | Status: AC
  Administered 2021-11-19: 15 mL via OROMUCOSAL

## 2021-11-19 MED ORDER — CEFAZOLIN SODIUM-DEXTROSE 2-4 GM/100ML-% IV SOLN
2.0000 g | INTRAVENOUS | Status: AC
  Administered 2021-11-19: 2 g via INTRAVENOUS
  Filled 2021-11-19: qty 100

## 2021-11-19 MED ORDER — FENTANYL CITRATE (PF) 100 MCG/2ML IJ SOLN
INTRAMUSCULAR | Status: AC
  Filled 2021-11-19: qty 2

## 2021-11-19 MED ORDER — BUPIVACAINE LIPOSOME 1.3 % IJ SUSP
INTRAMUSCULAR | Status: DC | PRN
  Administered 2021-11-19: 10 mL

## 2021-11-19 MED ORDER — MEPERIDINE HCL 50 MG/ML IJ SOLN: 6.2500 mg | INTRAMUSCULAR | Status: DC | PRN

## 2021-11-19 MED ORDER — VANCOMYCIN HCL 1 G IV SOLR
INTRAVENOUS | Status: DC | PRN
  Administered 2021-11-19: 1000 mg

## 2021-11-19 MED ORDER — EPHEDRINE SULFATE-NACL 50-0.9 MG/10ML-% IV SOSY
PREFILLED_SYRINGE | INTRAVENOUS | Status: DC | PRN
  Administered 2021-11-19: 5 mg via INTRAVENOUS
  Administered 2021-11-19 (×2): 10 mg via INTRAVENOUS

## 2021-11-19 MED ORDER — LIDOCAINE 2% (20 MG/ML) 5 ML SYRINGE
INTRAMUSCULAR | Status: DC | PRN
  Administered 2021-11-19: 50 mg via INTRAVENOUS

## 2021-11-19 MED ORDER — ACETAMINOPHEN 500 MG PO TABS
1000.0000 mg | ORAL_TABLET | Freq: Once | ORAL | Status: AC
  Administered 2021-11-19: 1000 mg via ORAL
  Filled 2021-11-19: qty 2

## 2021-11-19 MED ORDER — AMISULPRIDE (ANTIEMETIC) 5 MG/2ML IV SOLN: 10.0000 mg | Freq: Once | INTRAVENOUS | Status: DC | PRN

## 2021-11-19 MED ORDER — SODIUM CHLORIDE 0.9 % IR SOLN
Status: DC | PRN
  Administered 2021-11-19: 1000 mL

## 2021-11-19 MED ORDER — ACETAMINOPHEN 325 MG PO TABS: 325.0000 mg | ORAL_TABLET | Freq: Once | ORAL | Status: DC | PRN

## 2021-11-19 MED ORDER — BUPIVACAINE HCL (PF) 0.5 % IJ SOLN
INTRAMUSCULAR | Status: DC | PRN
Start: 2021-11-19 — End: 2021-11-19
  Administered 2021-11-19: 10 mL

## 2021-11-19 MED ORDER — DEXAMETHASONE SODIUM PHOSPHATE 10 MG/ML IJ SOLN
INTRAMUSCULAR | Status: DC | PRN
  Administered 2021-11-19: 10 mg via INTRAVENOUS

## 2021-11-19 MED ORDER — MELOXICAM 15 MG PO TABS: 15.0000 mg | ORAL_TABLET | Freq: Every day | ORAL | 0 refills | Status: DC

## 2021-11-19 MED ORDER — 0.9 % SODIUM CHLORIDE (POUR BTL) OPTIME
TOPICAL | Status: DC | PRN
  Administered 2021-11-19: 1000 mL

## 2021-11-19 MED ORDER — ONDANSETRON HCL 4 MG/2ML IJ SOLN
INTRAMUSCULAR | Status: DC | PRN
  Administered 2021-11-19: 4 mg via INTRAVENOUS

## 2021-11-19 MED ORDER — ROCURONIUM BROMIDE 10 MG/ML (PF) SYRINGE
PREFILLED_SYRINGE | INTRAVENOUS | Status: DC | PRN
  Administered 2021-11-19: 70 mg via INTRAVENOUS

## 2021-11-19 MED ORDER — FENTANYL CITRATE (PF) 100 MCG/2ML IJ SOLN
INTRAMUSCULAR | Status: DC | PRN
  Administered 2021-11-19: 50 ug via INTRAVENOUS

## 2021-11-19 MED ORDER — ACETAMINOPHEN 160 MG/5ML PO SOLN: 325.0000 mg | Freq: Once | ORAL | Status: DC | PRN

## 2021-11-19 SURGICAL SUPPLY — 69 items
AID PSTN UNV HD RSTRNT DISP (MISCELLANEOUS) ×1
APL PRP STRL LF DISP 70% ISPRP (MISCELLANEOUS) ×2
BAG COUNTER SPONGE SURGICOUNT (BAG) ×2 IMPLANT
BAG SPNG CNTER NS LX DISP (BAG) ×1
BASEPLATE GLENOID STD REV 42 (Joint) ×1 IMPLANT
BASEPLATE REV SHOULDER 29 OD (Plate) ×1 IMPLANT
BIT DRILL 3.2 PERIPHERAL SCREW (BIT) ×1 IMPLANT
BLADE SAW SAG 73X25 THK (BLADE) ×1
BLADE SAW SGTL 73X25 THK (BLADE) ×1 IMPLANT
BSPLAT GLND STD 29 RVRS SHLDR (Plate) ×1 IMPLANT
CHLORAPREP W/TINT 26 (MISCELLANEOUS) ×4 IMPLANT
CLSR STERI-STRIP ANTIMIC 1/2X4 (GAUZE/BANDAGES/DRESSINGS) ×2 IMPLANT
COOLER ICEMAN CLASSIC (MISCELLANEOUS) ×1 IMPLANT
COVER BACK TABLE 60X90IN (DRAPES) IMPLANT
COVER SURGICAL LIGHT HANDLE (MISCELLANEOUS) ×2 IMPLANT
DRAPE C-ARM 42X120 X-RAY (DRAPES) IMPLANT
DRAPE INCISE IOBAN 66X45 STRL (DRAPES) ×2 IMPLANT
DRAPE ORTHO SPLIT 77X108 STRL (DRAPES) ×4
DRAPE SHEET LG 3/4 BI-LAMINATE (DRAPES) ×4 IMPLANT
DRAPE SURG ORHT 6 SPLT 77X108 (DRAPES) ×2 IMPLANT
DRSG AQUACEL AG ADV 3.5X 6 (GAUZE/BANDAGES/DRESSINGS) ×2 IMPLANT
ELECT BLADE TIP CTD 4 INCH (ELECTRODE) ×2 IMPLANT
ELECT REM PT RETURN 15FT ADLT (MISCELLANEOUS) ×2 IMPLANT
FACESHIELD WRAPAROUND (MASK) ×2 IMPLANT
FACESHIELD WRAPAROUND OR TEAM (MASK) ×1 IMPLANT
GLOVE SRG 8 PF TXTR STRL LF DI (GLOVE) ×1 IMPLANT
GLOVE SURG ENC MOIS LTX SZ6.5 (GLOVE) ×4 IMPLANT
GLOVE SURG NEOPR MICRO LF SZ8 (GLOVE) ×4 IMPLANT
GLOVE SURG UNDER POLY LF SZ6.5 (GLOVE) ×2 IMPLANT
GLOVE SURG UNDER POLY LF SZ8 (GLOVE) ×2
GOWN STRL REUS W/TWL LRG LVL3 (GOWN DISPOSABLE) ×2 IMPLANT
GOWN STRL REUS W/TWL XL LVL3 (GOWN DISPOSABLE) ×2 IMPLANT
GUIDEWIRE GLENOID 2.5X220 (WIRE) ×1 IMPLANT
HANDPIECE INTERPULSE COAX TIP (DISPOSABLE) ×2
HUMERAL STEM AEQUALIS 3BX74MM (Stem) ×2 IMPLANT
IMPL REVERSE SHOULDER 0X3.5 (Shoulder) IMPLANT
IMPLANT REVERSE SHOULDER 0X3.5 (Shoulder) ×2 IMPLANT
INSERT REV SHOULDER 42X9 12-5 (Insert) ×1 IMPLANT
KIT BASIN OR (CUSTOM PROCEDURE TRAY) ×2 IMPLANT
KIT STABILIZATION SHOULDER (MISCELLANEOUS) ×2 IMPLANT
KIT TURNOVER KIT A (KITS) IMPLANT
MANIFOLD NEPTUNE II (INSTRUMENTS) ×2 IMPLANT
NDL MAYO CATGUT SZ4 TPR NDL (NEEDLE) IMPLANT
NEEDLE MAYO CATGUT SZ4 (NEEDLE) IMPLANT
NS IRRIG 1000ML POUR BTL (IV SOLUTION) ×2 IMPLANT
PACK SHOULDER (CUSTOM PROCEDURE TRAY) ×2 IMPLANT
PAD COLD SHLDR WRAP-ON (PAD) IMPLANT
RESTRAINT HEAD UNIVERSAL NS (MISCELLANEOUS) ×2 IMPLANT
SCREW 5.0X38 SMALL F/PERFORM (Screw) ×1 IMPLANT
SCREW 5.5X22 (Screw) ×1 IMPLANT
SCREW BONE 6.5X40 SM (Screw) ×1 IMPLANT
SET HNDPC FAN SPRY TIP SCT (DISPOSABLE) ×1 IMPLANT
SLING ULTRA II L (ORTHOPEDIC SUPPLIES) IMPLANT
SLING ULTRA III MED (ORTHOPEDIC SUPPLIES) ×2 IMPLANT
STEM HUMERAL AEQUALIS 3BX74MM (Stem) IMPLANT
STRIP CLOSURE SKIN 1/2X4 (GAUZE/BANDAGES/DRESSINGS) ×1 IMPLANT
SUCTION FRAZIER HANDLE 12FR (TUBING) ×2
SUCTION TUBE FRAZIER 12FR DISP (TUBING) ×1 IMPLANT
SUT ETHIBOND 2 V 37 (SUTURE) ×3 IMPLANT
SUT ETHIBOND NAB CT1 #1 30IN (SUTURE) ×2 IMPLANT
SUT FIBERWIRE #5 38 CONV NDL (SUTURE)
SUT MNCRL AB 4-0 PS2 18 (SUTURE) ×2 IMPLANT
SUT VIC AB 0 CT1 36 (SUTURE) IMPLANT
SUT VIC AB 3-0 SH 27 (SUTURE) ×2
SUT VIC AB 3-0 SH 27X BRD (SUTURE) ×1 IMPLANT
SUTURE FIBERWR #5 38 CONV NDL (SUTURE) IMPLANT
TOWEL OR 17X26 10 PK STRL BLUE (TOWEL DISPOSABLE) ×2 IMPLANT
TUBE SUCTION HIGH CAP CLEAR NV (SUCTIONS) ×2 IMPLANT
WATER STERILE IRR 1000ML POUR (IV SOLUTION) ×4 IMPLANT

## 2021-11-19 NOTE — Interval H&P Note (Signed)
All questions answered. Patient would like to proceed.

## 2021-11-19 NOTE — Anesthesia Procedure Notes (Signed)
Procedure Name: Intubation Date/Time: 11/19/2021 8:32 AM Performed by: Gerald Leitz, CRNA Pre-anesthesia Checklist: Patient identified, Patient being monitored, Timeout performed, Emergency Drugs available and Suction available Patient Re-evaluated:Patient Re-evaluated prior to induction Oxygen Delivery Method: Circle system utilized Preoxygenation: Pre-oxygenation with 100% oxygen Induction Type: IV induction Ventilation: Mask ventilation without difficulty Laryngoscope Size: Mac and 3 Grade View: Grade I Tube type: Oral Tube size: 7.5 mm Number of attempts: 1 Airway Equipment and Method: Stylet Placement Confirmation: ETT inserted through vocal cords under direct vision, positive ETCO2 and breath sounds checked- equal and bilateral Secured at: 23 cm Tube secured with: Tape Dental Injury: Teeth and Oropharynx as per pre-operative assessment

## 2021-11-19 NOTE — Transfer of Care (Signed)
Immediate Anesthesia Transfer of Care Note  Patient: Tim Mccullough  Procedure(s) Performed: Procedure(s): REVERSE SHOULDER ARTHROPLASTY (Left)  Patient Location: PACU  Anesthesia Type:General  Level of Consciousness: Alert, Awake, Oriented  Airway & Oxygen Therapy: Patient Spontanous Breathing  Post-op Assessment: Report given to RN  Post vital signs: Reviewed and stable  Last Vitals:  Vitals:   11/19/21 0625  BP: 125/85  Pulse: 80  Resp: 18  Temp: 36.9 C  SpO2: 33%    Complications: No apparent anesthesia complications

## 2021-11-19 NOTE — Anesthesia Postprocedure Evaluation (Signed)
Anesthesia Post Note  Patient: Tim Mccullough  Procedure(s) Performed: REVERSE SHOULDER ARTHROPLASTY (Left: Shoulder)     Patient location during evaluation: PACU Anesthesia Type: General Level of consciousness: awake and alert Pain management: pain level controlled Vital Signs Assessment: post-procedure vital signs reviewed and stable Respiratory status: spontaneous breathing, nonlabored ventilation, respiratory function stable and patient connected to nasal cannula oxygen Cardiovascular status: blood pressure returned to baseline and stable Postop Assessment: no apparent nausea or vomiting Anesthetic complications: no   No notable events documented.  Last Vitals:  Vitals:   11/19/21 1216 11/19/21 1217  BP:    Pulse: 77 77  Resp:    Temp:    SpO2: 98% 98%    Last Pain:  Vitals:   11/19/21 1100  TempSrc:   PainSc: 0-No pain                 Effie Berkshire

## 2021-11-19 NOTE — Op Note (Signed)
Orthopaedic Surgery Operative Note (CSN: [id number])  [name]  1951-01-25 Date of Surgery: 11/19/2021   Diagnoses:  Left cuff tear arthropathy  Procedure: Left reverse total Shoulder Arthroplasty   Operative Finding Successful completion of planned procedure.  Patient had no anterior superior cuff remnant.  There was some teres minor intact.  Good fixation and tug test was normal.  We did not release the inferior remnant of the subscapularis to avoid damage to the axillary nerve.  We put him in slightly tighter than normal as he wanted to use his arm earlier than typical.  Post-operative plan: The patient will be NWB in sling.  The patient will be will be discharged from PACU if continues to be stable as was plan prior to surgery.  DVT prophylaxis Aspirin 81 mg twice daily for 6 weeks.  Pain control with PRN pain medication preferring oral medicines.  Follow up plan will be scheduled in approximately 7 days for incision check and XR.  Physical therapy to start as soon as possible  Implants: Tornier size 3 short flex stem, 0 high offset tray with a 42+9 polyethylene, 42 standard glenosphere and a 29 standard baseplate with a 40 center screw  Post-Op Diagnosis: Same Surgeons:Primary: Hiram Gash, MD Assistants:Caroline McBane PA-C Location: [facility] ROOM 06 Anesthesia: General with Exparel Interscalene Antibiotics: Ancef 2g preop, Vancomycin 1000mg  locally Tourniquet time: None Estimated Blood Loss: 794 Complications: None Specimens: None Implants: Implant Name Type Inv. Item Serial No. Manufacturer Lot No. LRB No. Used Action  BASEPLATE REV SHOULDER 80XK OD - [id number] Plate BASEPLATE REV SHOULDER 78ML OD  [employer] [id number] Left 1 Implanted  BASEPLATE GLENOID STD REV 42 - [id number] Joint BASEPLATE GLENOID STD REV 42  [employer] [id number] Left 1 Implanted  HUMERAL STEM AEQUALIS 3BX74MM - [id number] Stem HUMERAL STEM AEQUALIS 3BX74MM  [employer] [id number] Left 1 Implanted   INSERT REV SHOULDER 42X9 12-5 - [id number] Insert INSERT REV SHOULDER 42X9 12-5  [employer] [id number] Left 1 Implanted  IMPLANT REVERSE SHOULDER 0X3.5 - [id number] Shoulder IMPLANT REVERSE SHOULDER 0X3.[place] [id number] Left 1 Implanted  SCREW BONE 6.5X40 SM - [id number] Screw SCREW BONE 6.5X40 SM  [employer]  Left 1 Implanted  SCREW 5.5X22 - [id number] Screw SCREW 5.5X22  [employer]  Left 1 Implanted  SCREW 5.0X38 SMALL F/PERFORM - [id number] Screw SCREW 5.0X38 SMALL F/PERFORM  [employer]  Left 1 Implanted    Indications for Surgery:   Tim Mccullough is a 71 y.o. male with cuff tear arthropathy and irreparable rotator cuff.  Benefits and risks of operative and nonoperative management were discussed prior to surgery with patient/guardian(s) and informed consent form was completed.  Infection and need for further surgery were discussed as was prosthetic stability and cuff issues.  We additionally specifically discussed risks of axillary nerve injury, infection, periprosthetic fracture, continued pain and longevity of implants prior to beginning procedure.      Procedure:   The patient was identified in the preoperative holding area where the surgical site was marked. Block placed by anesthesia with exparel.  The patient was taken to the OR where a procedural timeout was called and the above noted anesthesia was induced.  The patient was positioned beachchair on allen table with spider arm positioner.  Preoperative antibiotics were dosed.  The patient's left shoulder was prepped and draped in the usual sterile fashion.  A second preoperative timeout was called.       Standard deltopectoral approach was performed  with a #10 blade. We dissected down to the subcutaneous tissues and the cephalic vein was taken laterally with the deltoid. Clavipectoral fascia was incised in line with the incision. Deep retractors were placed. The long of the biceps tendon was identified and there was significant  tenosynovitis present.  Tenodesis was performed to the pectoralis tendon with #2 Ethibond. The remaining biceps was followed up into the rotator interval where it was released.   The subscapularis was taken down in a full thickness layer with capsule along the humeral neck extending inferiorly around the humeral head. We continued releasing the capsule directly off of the osteophytes inferiorly all the way around the corner. This allowed [place] to dislocate the humeral head.   The humeral head had evidence of severe osteoarthritic wear with full-thickness cartilage loss and exposed subchondral bone. There was significant flattening of the humeral head.   The rotator cuff was carefully examined and noted to be irreperably torn.  The decision was confirmed that a reverse total shoulder was indicated for this patient.  There were osteophytes along the inferior humeral neck. The osteophytes were removed with an osteotome and a rongeur.  Osteophytes were removed with a rongeur and an osteotome and the anatomic neck was well visualized.     A humeral cutting guide was inserted down the intramedullary canal. The version was set at 20 of retroversion. Humeral osteotomy was performed with an oscillating saw. The head fragment was passed off the back table. A starter awl was used to open the humeral canal. We next used T-handle straight sound reamers to ream up to an appropriate fit. A chisel was used to remove proximal humeral bone. We then broached starting with a size one broach and broaching up to 3 which obtained an appropriate fit. The broach handle was removed. A cut protector was placed. The broach handle was removed and a cut protector was placed. The humerus was retracted posteriorly and we turned our attention to glenoid exposure.  The subscapularis was again identified and immediately we took care to palpate the axillary nerve anteriorly and verify its position with gentle palpation as well as the tug  test.  We then released the SGHL with bovie cautery prior to placing a curved mayo at the junction of the anterior glenoid well above the axillary nerve and bluntly dissecting the subscapularis from the capsule.  We then carefully protected the axillary nerve as we gently released the inferior capsule to fully mobilize the subscapularis.  An anterior deltoid retractor was then placed as well as a small Hohmann retractor superiorly.   The glenoid was inspected and had evidence of severe osteoarthritic wear with full-thickness cartilage loss and exposed subchondral bone.    The remaining labrum was removed circumferentially taking great care not to disrupt the posterior capsule.   The glenoid drill guide was placed and used to drill a guide pin in the center, inferior position. The glenoid face was then reamed concentrically over the guide wire. The center hole was drilled over the guidepin in a near anatomic angle of version. Next the glenoid vault was drilled back to a depth of 40 mm.  We tapped and then placed a 10mm size baseplate with additional 49mm lateralization was selected with a 6.5 mm x 40 mm length central screw.  The base plate was screwed into the glenoid vault obtaining secure fixation. We next placed superior and inferior locking screws for additional fixation.  Next a 42 mm glenosphere was selected and impacted  onto the baseplate. The center screw was tightened.  We turned attention back to the humeral side. The cut protector was removed. We trialed with multiple size tray and polyethylene options and selected a 9 which provided good stability and range of motion without excess soft tissue tension. The offset was dialed in to match the normal anatomy. The shoulder was trialed.  There was good ROM in all planes and the shoulder was stable with no inferior translation.  The real humeral implants were opened after again confirming sizes.  The trial was removed. #5 Fiberwire x4 sutures passed  through the humeral neck for subscap repair. The humeral component was press-fit obtaining a secure fit. A +0 high offset tray was selected and impacted onto the stem.  A 42+9 polyethylene liner was impacted onto the stem.  The joint was reduced and thoroughly irrigated with pulsatile lavage. Subscap was repaired back with #5 Fiberwire sutures through bone tunnels. Hemostasis was obtained. The deltopectoral interval was reapproximated with #1 Ethibond. The subcutaneous tissues were closed with 2-0 Vicryl and the skin was closed with running monocryl.    The wounds were cleaned and dried and an Aquacel dressing was placed. The drapes taken down. The arm was placed into sling with abduction pillow. Patient was awakened, extubated, and transferred to the recovery room in stable condition. There were no intraoperative complications. The sponge, needle, and attention counts were  correct at the end of the case.       Tim Chapel, PA-C, present and scrubbed throughout the case, critical for completion in a timely fashion, and for retraction, instrumentation, closure.

## 2021-11-19 NOTE — Anesthesia Procedure Notes (Addendum)
Anesthesia Regional Block: Interscalene brachial plexus block   Pre-Anesthetic Checklist: , timeout performed,  Correct Patient, Correct Site, Correct Laterality,  Correct Procedure, Correct Position, site marked,  Risks and benefits discussed,  Surgical consent,  Pre-op evaluation,  At surgeon's request and post-op pain management  Laterality: Left  Prep: chloraprep       Needles:  Injection technique: Single-shot  Needle Type: Echogenic Stimulator Needle     Needle Length: 9cm  Needle Gauge: 21     Additional Needles:   Procedures:,,,, ultrasound used (permanent image in chart),,    Narrative:  Start time: 11/19/2021 8:05 AM End time: 11/19/2021 8:10 AM Injection made incrementally with aspirations every 5 mL.  Performed by: Personally  Anesthesiologist: Effie Berkshire, MD  Additional Notes: Patient tolerated the procedure well. Local anesthetic introduced in an incremental fashion under minimal resistance after negative aspirations. No paresthesias were elicited. After completion of the procedure, no acute issues were identified and patient continued to be monitored by RN.

## 2021-11-20 ENCOUNTER — Encounter (HOSPITAL_COMMUNITY): Payer: Self-pay | Admitting: Orthopaedic Surgery

## 2021-11-25 DIAGNOSIS — M25512 Pain in left shoulder: Secondary | ICD-10-CM | POA: Diagnosis not present

## 2021-11-25 DIAGNOSIS — M6281 Muscle weakness (generalized): Secondary | ICD-10-CM | POA: Diagnosis not present

## 2021-11-25 DIAGNOSIS — M25612 Stiffness of left shoulder, not elsewhere classified: Secondary | ICD-10-CM | POA: Diagnosis not present

## 2021-11-25 DIAGNOSIS — S46012D Strain of muscle(s) and tendon(s) of the rotator cuff of left shoulder, subsequent encounter: Secondary | ICD-10-CM | POA: Diagnosis not present

## 2021-11-25 DIAGNOSIS — M19012 Primary osteoarthritis, left shoulder: Secondary | ICD-10-CM | POA: Diagnosis not present

## 2021-11-27 DIAGNOSIS — M19012 Primary osteoarthritis, left shoulder: Secondary | ICD-10-CM | POA: Diagnosis not present

## 2021-12-02 DIAGNOSIS — S46012D Strain of muscle(s) and tendon(s) of the rotator cuff of left shoulder, subsequent encounter: Secondary | ICD-10-CM | POA: Diagnosis not present

## 2021-12-02 DIAGNOSIS — M19012 Primary osteoarthritis, left shoulder: Secondary | ICD-10-CM | POA: Diagnosis not present

## 2021-12-02 DIAGNOSIS — M6281 Muscle weakness (generalized): Secondary | ICD-10-CM | POA: Diagnosis not present

## 2021-12-02 DIAGNOSIS — M25612 Stiffness of left shoulder, not elsewhere classified: Secondary | ICD-10-CM | POA: Diagnosis not present

## 2021-12-02 DIAGNOSIS — M25512 Pain in left shoulder: Secondary | ICD-10-CM | POA: Diagnosis not present

## 2021-12-05 DIAGNOSIS — M25512 Pain in left shoulder: Secondary | ICD-10-CM | POA: Diagnosis not present

## 2021-12-05 DIAGNOSIS — M6281 Muscle weakness (generalized): Secondary | ICD-10-CM | POA: Diagnosis not present

## 2021-12-05 DIAGNOSIS — M25612 Stiffness of left shoulder, not elsewhere classified: Secondary | ICD-10-CM | POA: Diagnosis not present

## 2021-12-05 DIAGNOSIS — M19012 Primary osteoarthritis, left shoulder: Secondary | ICD-10-CM | POA: Diagnosis not present

## 2021-12-05 DIAGNOSIS — S46012D Strain of muscle(s) and tendon(s) of the rotator cuff of left shoulder, subsequent encounter: Secondary | ICD-10-CM | POA: Diagnosis not present

## 2021-12-09 DIAGNOSIS — M19012 Primary osteoarthritis, left shoulder: Secondary | ICD-10-CM | POA: Diagnosis not present

## 2021-12-09 DIAGNOSIS — M6281 Muscle weakness (generalized): Secondary | ICD-10-CM | POA: Diagnosis not present

## 2021-12-09 DIAGNOSIS — S46012D Strain of muscle(s) and tendon(s) of the rotator cuff of left shoulder, subsequent encounter: Secondary | ICD-10-CM | POA: Diagnosis not present

## 2021-12-09 DIAGNOSIS — M25612 Stiffness of left shoulder, not elsewhere classified: Secondary | ICD-10-CM | POA: Diagnosis not present

## 2021-12-09 DIAGNOSIS — M25512 Pain in left shoulder: Secondary | ICD-10-CM | POA: Diagnosis not present

## 2021-12-16 DIAGNOSIS — S46012D Strain of muscle(s) and tendon(s) of the rotator cuff of left shoulder, subsequent encounter: Secondary | ICD-10-CM | POA: Diagnosis not present

## 2021-12-16 DIAGNOSIS — M6281 Muscle weakness (generalized): Secondary | ICD-10-CM | POA: Diagnosis not present

## 2021-12-16 DIAGNOSIS — M25512 Pain in left shoulder: Secondary | ICD-10-CM | POA: Diagnosis not present

## 2021-12-16 DIAGNOSIS — M25612 Stiffness of left shoulder, not elsewhere classified: Secondary | ICD-10-CM | POA: Diagnosis not present

## 2021-12-16 DIAGNOSIS — M19012 Primary osteoarthritis, left shoulder: Secondary | ICD-10-CM | POA: Diagnosis not present

## 2021-12-19 DIAGNOSIS — M19012 Primary osteoarthritis, left shoulder: Secondary | ICD-10-CM | POA: Diagnosis not present

## 2021-12-23 DIAGNOSIS — S46012D Strain of muscle(s) and tendon(s) of the rotator cuff of left shoulder, subsequent encounter: Secondary | ICD-10-CM | POA: Diagnosis not present

## 2021-12-23 DIAGNOSIS — M25512 Pain in left shoulder: Secondary | ICD-10-CM | POA: Diagnosis not present

## 2021-12-23 DIAGNOSIS — M25612 Stiffness of left shoulder, not elsewhere classified: Secondary | ICD-10-CM | POA: Diagnosis not present

## 2021-12-23 DIAGNOSIS — M6281 Muscle weakness (generalized): Secondary | ICD-10-CM | POA: Diagnosis not present

## 2021-12-23 DIAGNOSIS — M19012 Primary osteoarthritis, left shoulder: Secondary | ICD-10-CM | POA: Diagnosis not present

## 2021-12-30 DIAGNOSIS — M6281 Muscle weakness (generalized): Secondary | ICD-10-CM | POA: Diagnosis not present

## 2021-12-30 DIAGNOSIS — M19012 Primary osteoarthritis, left shoulder: Secondary | ICD-10-CM | POA: Diagnosis not present

## 2021-12-30 DIAGNOSIS — S46012D Strain of muscle(s) and tendon(s) of the rotator cuff of left shoulder, subsequent encounter: Secondary | ICD-10-CM | POA: Diagnosis not present

## 2021-12-30 DIAGNOSIS — M25512 Pain in left shoulder: Secondary | ICD-10-CM | POA: Diagnosis not present

## 2021-12-30 DIAGNOSIS — M25612 Stiffness of left shoulder, not elsewhere classified: Secondary | ICD-10-CM | POA: Diagnosis not present

## 2022-01-06 DIAGNOSIS — M25512 Pain in left shoulder: Secondary | ICD-10-CM | POA: Diagnosis not present

## 2022-01-06 DIAGNOSIS — M19012 Primary osteoarthritis, left shoulder: Secondary | ICD-10-CM | POA: Diagnosis not present

## 2022-01-06 DIAGNOSIS — S46012D Strain of muscle(s) and tendon(s) of the rotator cuff of left shoulder, subsequent encounter: Secondary | ICD-10-CM | POA: Diagnosis not present

## 2022-01-06 DIAGNOSIS — M25612 Stiffness of left shoulder, not elsewhere classified: Secondary | ICD-10-CM | POA: Diagnosis not present

## 2022-01-06 DIAGNOSIS — M6281 Muscle weakness (generalized): Secondary | ICD-10-CM | POA: Diagnosis not present

## 2022-01-09 DIAGNOSIS — Z809 Family history of malignant neoplasm, unspecified: Secondary | ICD-10-CM | POA: Diagnosis not present

## 2022-01-09 DIAGNOSIS — Z6833 Body mass index (BMI) 33.0-33.9, adult: Secondary | ICD-10-CM | POA: Diagnosis not present

## 2022-01-09 DIAGNOSIS — K219 Gastro-esophageal reflux disease without esophagitis: Secondary | ICD-10-CM | POA: Diagnosis not present

## 2022-01-09 DIAGNOSIS — Z7982 Long term (current) use of aspirin: Secondary | ICD-10-CM | POA: Diagnosis not present

## 2022-01-09 DIAGNOSIS — Z8249 Family history of ischemic heart disease and other diseases of the circulatory system: Secondary | ICD-10-CM | POA: Diagnosis not present

## 2022-01-09 DIAGNOSIS — I10 Essential (primary) hypertension: Secondary | ICD-10-CM | POA: Diagnosis not present

## 2022-01-09 DIAGNOSIS — R69 Illness, unspecified: Secondary | ICD-10-CM | POA: Diagnosis not present

## 2022-01-09 DIAGNOSIS — Z7984 Long term (current) use of oral hypoglycemic drugs: Secondary | ICD-10-CM | POA: Diagnosis not present

## 2022-01-09 DIAGNOSIS — E1165 Type 2 diabetes mellitus with hyperglycemia: Secondary | ICD-10-CM | POA: Diagnosis not present

## 2022-01-09 DIAGNOSIS — E669 Obesity, unspecified: Secondary | ICD-10-CM | POA: Diagnosis not present

## 2022-01-09 DIAGNOSIS — E785 Hyperlipidemia, unspecified: Secondary | ICD-10-CM | POA: Diagnosis not present

## 2022-01-09 DIAGNOSIS — Z833 Family history of diabetes mellitus: Secondary | ICD-10-CM | POA: Diagnosis not present

## 2022-01-15 DIAGNOSIS — M25612 Stiffness of left shoulder, not elsewhere classified: Secondary | ICD-10-CM | POA: Diagnosis not present

## 2022-01-15 DIAGNOSIS — M6281 Muscle weakness (generalized): Secondary | ICD-10-CM | POA: Diagnosis not present

## 2022-01-15 DIAGNOSIS — S46012D Strain of muscle(s) and tendon(s) of the rotator cuff of left shoulder, subsequent encounter: Secondary | ICD-10-CM | POA: Diagnosis not present

## 2022-01-15 DIAGNOSIS — M25512 Pain in left shoulder: Secondary | ICD-10-CM | POA: Diagnosis not present

## 2022-01-15 DIAGNOSIS — M19012 Primary osteoarthritis, left shoulder: Secondary | ICD-10-CM | POA: Diagnosis not present

## 2022-01-19 DIAGNOSIS — M19012 Primary osteoarthritis, left shoulder: Secondary | ICD-10-CM | POA: Diagnosis not present

## 2022-01-19 DIAGNOSIS — M25512 Pain in left shoulder: Secondary | ICD-10-CM | POA: Diagnosis not present

## 2022-01-19 DIAGNOSIS — M6281 Muscle weakness (generalized): Secondary | ICD-10-CM | POA: Diagnosis not present

## 2022-01-19 DIAGNOSIS — M25612 Stiffness of left shoulder, not elsewhere classified: Secondary | ICD-10-CM | POA: Diagnosis not present

## 2022-01-19 DIAGNOSIS — S46012D Strain of muscle(s) and tendon(s) of the rotator cuff of left shoulder, subsequent encounter: Secondary | ICD-10-CM | POA: Diagnosis not present

## 2022-01-27 DIAGNOSIS — M6281 Muscle weakness (generalized): Secondary | ICD-10-CM | POA: Diagnosis not present

## 2022-01-27 DIAGNOSIS — S46012D Strain of muscle(s) and tendon(s) of the rotator cuff of left shoulder, subsequent encounter: Secondary | ICD-10-CM | POA: Diagnosis not present

## 2022-01-27 DIAGNOSIS — M25512 Pain in left shoulder: Secondary | ICD-10-CM | POA: Diagnosis not present

## 2022-01-27 DIAGNOSIS — M19012 Primary osteoarthritis, left shoulder: Secondary | ICD-10-CM | POA: Diagnosis not present

## 2022-01-27 DIAGNOSIS — M25612 Stiffness of left shoulder, not elsewhere classified: Secondary | ICD-10-CM | POA: Diagnosis not present

## 2022-01-31 ENCOUNTER — Other Ambulatory Visit: Payer: Self-pay | Admitting: Family Medicine

## 2022-02-04 DIAGNOSIS — M6281 Muscle weakness (generalized): Secondary | ICD-10-CM | POA: Diagnosis not present

## 2022-02-04 DIAGNOSIS — D2261 Melanocytic nevi of right upper limb, including shoulder: Secondary | ICD-10-CM | POA: Diagnosis not present

## 2022-02-04 DIAGNOSIS — L570 Actinic keratosis: Secondary | ICD-10-CM | POA: Diagnosis not present

## 2022-02-04 DIAGNOSIS — D2262 Melanocytic nevi of left upper limb, including shoulder: Secondary | ICD-10-CM | POA: Diagnosis not present

## 2022-02-04 DIAGNOSIS — Z85820 Personal history of malignant melanoma of skin: Secondary | ICD-10-CM | POA: Diagnosis not present

## 2022-02-04 DIAGNOSIS — Z85828 Personal history of other malignant neoplasm of skin: Secondary | ICD-10-CM | POA: Diagnosis not present

## 2022-02-04 DIAGNOSIS — M19012 Primary osteoarthritis, left shoulder: Secondary | ICD-10-CM | POA: Diagnosis not present

## 2022-02-04 DIAGNOSIS — L821 Other seborrheic keratosis: Secondary | ICD-10-CM | POA: Diagnosis not present

## 2022-02-04 DIAGNOSIS — S46012D Strain of muscle(s) and tendon(s) of the rotator cuff of left shoulder, subsequent encounter: Secondary | ICD-10-CM | POA: Diagnosis not present

## 2022-02-04 DIAGNOSIS — M25612 Stiffness of left shoulder, not elsewhere classified: Secondary | ICD-10-CM | POA: Diagnosis not present

## 2022-02-04 DIAGNOSIS — M25512 Pain in left shoulder: Secondary | ICD-10-CM | POA: Diagnosis not present

## 2022-02-04 DIAGNOSIS — D225 Melanocytic nevi of trunk: Secondary | ICD-10-CM | POA: Diagnosis not present

## 2022-02-10 DIAGNOSIS — M6281 Muscle weakness (generalized): Secondary | ICD-10-CM | POA: Diagnosis not present

## 2022-02-10 DIAGNOSIS — S46012D Strain of muscle(s) and tendon(s) of the rotator cuff of left shoulder, subsequent encounter: Secondary | ICD-10-CM | POA: Diagnosis not present

## 2022-02-10 DIAGNOSIS — M19012 Primary osteoarthritis, left shoulder: Secondary | ICD-10-CM | POA: Diagnosis not present

## 2022-02-10 DIAGNOSIS — M25512 Pain in left shoulder: Secondary | ICD-10-CM | POA: Diagnosis not present

## 2022-02-10 DIAGNOSIS — M25612 Stiffness of left shoulder, not elsewhere classified: Secondary | ICD-10-CM | POA: Diagnosis not present

## 2022-02-17 DIAGNOSIS — M19012 Primary osteoarthritis, left shoulder: Secondary | ICD-10-CM | POA: Diagnosis not present

## 2022-02-23 ENCOUNTER — Other Ambulatory Visit: Payer: Self-pay | Admitting: Internal Medicine

## 2022-02-23 ENCOUNTER — Other Ambulatory Visit: Payer: Self-pay | Admitting: Family Medicine

## 2022-03-12 ENCOUNTER — Other Ambulatory Visit: Payer: Self-pay | Admitting: Internal Medicine

## 2022-03-21 ENCOUNTER — Other Ambulatory Visit: Payer: Self-pay | Admitting: Family Medicine

## 2022-04-03 ENCOUNTER — Other Ambulatory Visit: Payer: Self-pay | Admitting: Internal Medicine

## 2022-04-03 NOTE — Telephone Encounter (Signed)
Please schedule 12 month F/U appointment for 90 day refills.

## 2022-04-07 NOTE — Telephone Encounter (Signed)
Attempted to schedule.  LMOV to call office.  ° °

## 2022-04-08 NOTE — Telephone Encounter (Signed)
Patient is coming 7/6

## 2022-04-16 ENCOUNTER — Other Ambulatory Visit: Payer: Self-pay | Admitting: Family Medicine

## 2022-04-16 NOTE — Telephone Encounter (Signed)
Scheduled 04/23/22 at 12:30

## 2022-04-17 ENCOUNTER — Other Ambulatory Visit: Payer: Self-pay | Admitting: Family Medicine

## 2022-04-23 ENCOUNTER — Encounter: Payer: Self-pay | Admitting: Family Medicine

## 2022-04-23 ENCOUNTER — Ambulatory Visit (INDEPENDENT_AMBULATORY_CARE_PROVIDER_SITE_OTHER): Payer: Medicare HMO | Admitting: Family Medicine

## 2022-04-23 VITALS — BP 120/78 | HR 99 | Temp 97.2°F | Ht 70.0 in | Wt 226.0 lb

## 2022-04-23 DIAGNOSIS — E119 Type 2 diabetes mellitus without complications: Secondary | ICD-10-CM | POA: Diagnosis not present

## 2022-04-23 DIAGNOSIS — E785 Hyperlipidemia, unspecified: Secondary | ICD-10-CM | POA: Diagnosis not present

## 2022-04-23 DIAGNOSIS — Z87891 Personal history of nicotine dependence: Secondary | ICD-10-CM | POA: Diagnosis not present

## 2022-04-23 LAB — CBC WITH DIFFERENTIAL/PLATELET
Basophils Absolute: 0 10*3/uL (ref 0.0–0.1)
Basophils Relative: 0.3 % (ref 0.0–3.0)
Eosinophils Absolute: 0.2 10*3/uL (ref 0.0–0.7)
Eosinophils Relative: 2.2 % (ref 0.0–5.0)
HCT: 40.4 % (ref 39.0–52.0)
Hemoglobin: 13.4 g/dL (ref 13.0–17.0)
Lymphocytes Relative: 34.9 % (ref 12.0–46.0)
Lymphs Abs: 2.7 10*3/uL (ref 0.7–4.0)
MCHC: 33.2 g/dL (ref 30.0–36.0)
MCV: 89.8 fl (ref 78.0–100.0)
Monocytes Absolute: 0.6 10*3/uL (ref 0.1–1.0)
Monocytes Relative: 7.8 % (ref 3.0–12.0)
Neutro Abs: 4.2 10*3/uL (ref 1.4–7.7)
Neutrophils Relative %: 54.8 % (ref 43.0–77.0)
Platelets: 241 10*3/uL (ref 150.0–400.0)
RBC: 4.5 Mil/uL (ref 4.22–5.81)
RDW: 14 % (ref 11.5–15.5)
WBC: 7.6 10*3/uL (ref 4.0–10.5)

## 2022-04-23 LAB — COMPREHENSIVE METABOLIC PANEL
ALT: 23 U/L (ref 0–53)
AST: 19 U/L (ref 0–37)
Albumin: 4.4 g/dL (ref 3.5–5.2)
Alkaline Phosphatase: 77 U/L (ref 39–117)
BUN: 17 mg/dL (ref 6–23)
CO2: 29 mEq/L (ref 19–32)
Calcium: 9.4 mg/dL (ref 8.4–10.5)
Chloride: 99 mEq/L (ref 96–112)
Creatinine, Ser: 1.07 mg/dL (ref 0.40–1.50)
GFR: 70.41 mL/min (ref >60.00)
Glucose, Bld: 206 mg/dL — ABNORMAL HIGH (ref 70–99)
Potassium: 5.4 mEq/L — ABNORMAL HIGH (ref 3.5–5.1)
Sodium: 137 mEq/L (ref 135–145)
Total Bilirubin: 0.6 mg/dL (ref 0.2–1.2)
Total Protein: 7 g/dL (ref 6.0–8.3)

## 2022-04-23 LAB — LIPID PANEL
Cholesterol: 149 mg/dL (ref 0–200)
HDL: 51 mg/dL (ref >39.00)
LDL Cholesterol: 60 mg/dL (ref 0–99)
NonHDL: 98.43
Total CHOL/HDL Ratio: 3
Triglycerides: 192 mg/dL — ABNORMAL HIGH (ref 0.0–149.0)
VLDL: 38.4 mg/dL (ref 0.0–40.0)

## 2022-04-23 LAB — TSH: TSH: 0.48 u[IU]/mL (ref 0.35–5.50)

## 2022-04-23 LAB — HEMOGLOBIN A1C: Hgb A1c MFr Bld: 8.9 % — ABNORMAL HIGH (ref 4.6–6.5)

## 2022-04-23 NOTE — Patient Instructions (Addendum)
Go to the lab on the way out.   If you have mychart we'll likely use that to update you.    Take care.  Glad to see you. Please sign a record release for Dr. Sabra Heck with the eye clinic.   We'll make plans about your sugar when I see your labs.

## 2022-04-23 NOTE — Progress Notes (Unsigned)
Diabetes:  Using medications without difficulties: yes Hypoglycemic episodes: no Hyperglycemic episodes: no Feet problems: no change from prior back surgery with tingling from that.   Blood Sugars averaging: up to 200s.   eye exam within last year: will f/u later this year.   He was asking about other meds.  Metformin wasn't keeping his sugar down, taking '1000mg'$  BID.    Elevated Cholesterol: Using medications without problems:yes Muscle aches: likely from arthritis not from statin, d/w pt.   Diet compliance: yes Exercise: d/w pt.    We talked about lung cancer screening.  He quit smoking in 1998.  He smoked about 20 years.  I told him I would check to see if he is eligible.  See following phone note.  Meds, vitals, and allergies reviewed.  ROS: Per HPI unless specifically indicated in ROS section   GEN: nad, alert and oriented HEENT: ncat NECK: supple w/o LA CV: rrr. PULM: ctab, no inc wob ABD: soft, +bs EXT: no edema SKIN: no acute rash  Diabetic foot exam: Normal inspection No skin breakdown No calluses  Normal DP pulses Normal sensation to light touch and monofilament on R foot, dec on L foot at baseline.   Nails normal

## 2022-04-26 ENCOUNTER — Other Ambulatory Visit: Payer: Self-pay | Admitting: Family Medicine

## 2022-04-26 ENCOUNTER — Telehealth: Payer: Self-pay | Admitting: Family Medicine

## 2022-04-26 DIAGNOSIS — E875 Hyperkalemia: Secondary | ICD-10-CM

## 2022-04-26 DIAGNOSIS — E785 Hyperlipidemia, unspecified: Secondary | ICD-10-CM | POA: Insufficient documentation

## 2022-04-26 MED ORDER — DAPAGLIFLOZIN PROPANEDIOL 5 MG PO TABS: 5.0000 mg | ORAL_TABLET | Freq: Every day | ORAL | 3 refills | Status: DC

## 2022-04-26 MED ORDER — METFORMIN HCL 500 MG PO TABS: 1000.0000 mg | ORAL_TABLET | Freq: Two times a day (BID) | ORAL | 3 refills | Status: DC

## 2022-04-26 NOTE — Assessment & Plan Note (Signed)
Continue atorvastatin.  See notes on labs. 

## 2022-04-26 NOTE — Assessment & Plan Note (Signed)
Continue metformin for now, 1000 mg twice daily.  See notes on labs.

## 2022-04-26 NOTE — Assessment & Plan Note (Signed)
Discussed with patient about lung cancer screening program in general and I am checking on eligibility for him.

## 2022-04-26 NOTE — Telephone Encounter (Signed)
  We talked about lung cancer screening.  He quit smoking in 1998.  He smoked about 20 years.   Is he eligible for your program?  Please let me know.

## 2022-04-28 NOTE — Telephone Encounter (Signed)
Notify patient.  I checked with Tim Mccullough with pulmonary.  He does not qualify for the lung cancer screening program because he quit more than 15 years ago.  This means that his cancer risk should be low enough that the exposure to radiation would not be worthwhile currently.

## 2022-04-28 NOTE — Telephone Encounter (Signed)
Spoke with pt relaying Dr. Josefine Class message.  Pt verbalizes understanding and expresses his thanks for checking into it.

## 2022-05-04 ENCOUNTER — Other Ambulatory Visit (INDEPENDENT_AMBULATORY_CARE_PROVIDER_SITE_OTHER): Payer: Medicare HMO

## 2022-05-04 DIAGNOSIS — E875 Hyperkalemia: Secondary | ICD-10-CM

## 2022-05-04 LAB — POTASSIUM: Potassium: 4.3 mEq/L (ref 3.5–5.1)

## 2022-05-13 ENCOUNTER — Other Ambulatory Visit: Payer: Self-pay | Admitting: Family Medicine

## 2022-05-14 ENCOUNTER — Encounter: Payer: Self-pay | Admitting: Internal Medicine

## 2022-05-14 ENCOUNTER — Ambulatory Visit: Payer: Medicare HMO | Admitting: Internal Medicine

## 2022-05-14 VITALS — BP 110/70 | HR 85 | Ht 70.0 in | Wt 223.6 lb

## 2022-05-14 DIAGNOSIS — I428 Other cardiomyopathies: Secondary | ICD-10-CM

## 2022-05-14 DIAGNOSIS — I5022 Chronic systolic (congestive) heart failure: Secondary | ICD-10-CM | POA: Diagnosis not present

## 2022-05-14 NOTE — Patient Instructions (Signed)

## 2022-05-14 NOTE — Progress Notes (Signed)
Patient ID: Tim Mccullough, male   DOB: 1951/08/21, 71 y.o.   MRN: [id number]       Patient Care Team: Tonia Ghent, MD as PCP - General (Family Medicine) Wellington Hampshire, MD as PCP - Cardiology (Cardiology)   HPI  Tim Mccullough is a 71 y.o. male seen in  for wide complex beats in a pattern of bigeminy in the setting of nonischemic cardiomyopathy initially diagnosed in 2010.  These records are not available but the old chart reports ejection fraction of 35% and no obstructive coronary disease.  It also mentions a hemodynamically insignificant VSD.  Failed flecainide and amiodarone  5/21 underwent EP testing and attempted ablation of an intramural/septal focus.  PVCs could be suppressed but reemerged following cessation of energy.  He was started on amiodarone 6/21 Referred to Dr Noralee Stain.  Ablation 10/21.    He has been treated with beta-blockers and Entresto, 2/2 cost >> losartan The patient denies chest pain, shortness of breath, nocturnal dyspnea, orthopnea or peripheral edema.  There have been no palpitations, lightheadedness or syncope.   Not been successful at losing weight hemoglobin A1c remains elevated   DATE TEST EF    5/13 Echo   50-55 %    1/21 Echo   30-35 %    2/21 LHC   [place] - normal  11/21 Echo  30-35%   8/22 Echo  50-55%     Date Cr K Mg Hgb  2/21 1.15 5.1  13.8  3/21  1.32 4.7 1.9<<1.3    6/21      10/21 0.9 3.9 1.6<<1.4   4/22 1.03 4.5    6/23 1.07 4.3  13.4     Date PVCs  Multiple ECGS Bigeminy   8/21 3%      Past Medical History:  Diagnosis Date   Abnormal echocardiogram 12/2009   Repeated after med mgmt of cardiomyopathy showed EF 40-45%, mildly dilated LV, mild LV hypertrophy, anterolateral and apical hypokinesis, mild diastolic dysfunction.   Anemia    hx   Anxiety    Arthritis    Blood transfusion without reported diagnosis 09/2012   BPH (benign prostatic hypertrophy) 2004   Cancer [facility])    melanoma back   Cardiomyopathy     ? tachycardia-mediated   Depression    Diabetes mellitus 11/2006   Type II   Diverticulosis    Echocardiogram abnormal 05/2009   Mild LVH with a mildly dilated LV.  EF was difficult to assess given poor acoustic windows but appeared moderately decreased.  Mild MR.  Probable small perimembranous VSD, mild LAE   Esophageal erosions    GERD (gastroesophageal reflux disease)    H/O cardiac radiofrequency ablation    Hip pain    History of MRI 05/2009   Cardiac MRI was done to followup difficult echo.  This showed mild to moderately dilated LV, mild to moderate LAE, EF  33% with global hypokinesis, normal RV size with mild systolic dysfunction.  There was no large VSD evident.  There was no myocardial delayed enhancement so no evidence for infiltrative disease or infarction.  LHC (7/10) showed only luminal irreg. in the coronaries and EF 35%   Holter monitor, abnormal 05/2009   Frequent PAC's and PVC's  Average HR 96  (but he had started Toprol XL).  HR range 71-130   Hyperlipidemia 04/1998   Hypertension 1995   Obesity    OSA (obstructive sleep apnea)    Not using CPAP- improved with weight  loss   Pneumonia    HX OF pna   Sinus tachycardia    Pt says he has been told his heart rate is high since he was in high school.    Sleep apnea    has cpap; but does not use   VSD (ventricular septal defect)    Probable small perimembranous    Past Surgical History:  Procedure Laterality Date   CARDIAC CATHETERIZATION  05/30/2009   Dilated cardiomyopathy.  min VSD.  Hypokin Inf  wall.  Multiple min luminal Irr.  EF 35%   CATARACT EXTRACTION, BILATERAL     COLONOSCOPY W/ BIOPSIES  08/24/2006   Divertics, polyps x3, bx negative, repeat 2012 with mild diverticulosis in teh sigmoid colon and 1 polyp in tranverse colon, repeat due 2017   ESOPHAGOGASTRODUODENOSCOPY  2012   1)Barrett's esoph, 2) Erosive esophagitis, 3) Mild gastritis in the antrum, 4) Duodenitis in the bulb of duodenum, 5) Small hiatal  hernia   INGUINAL HERNIA REPAIR  1985   Left   INGUINAL HERNIA REPAIR  1999   Right   KNEE ARTHROSCOPY  10/15/2011   Procedure: ARTHROSCOPY KNEE;  Surgeon: Ninetta Lights, MD;  Location: [facility];  Service: Orthopedics;  Laterality: Right;  right knee scope with lateral meniscectomy, removal loose foreign body, and microfracture technique   LAMINECTOMY  1987   with discectomy   PVC ABLATION N/A 03/18/2020   Procedure: PVC ABLATION;  Surgeon: Evans Lance, MD;  Location: [facility];  Service: Cardiovascular;  Laterality: N/A;   REVERSE SHOULDER ARTHROPLASTY Left 11/19/2021   Procedure: REVERSE SHOULDER ARTHROPLASTY;  Surgeon: Hiram Gash, MD;  Location: [facility];  Service: Orthopedics;  Laterality: Left;   RIGHT/LEFT HEART CATH AND CORONARY ANGIOGRAPHY N/A 12/25/2019   Procedure: RIGHT/LEFT HEART CATH AND CORONARY ANGIOGRAPHY;  Surgeon: Wellington Hampshire, MD;  Location: [facility];  Service: Cardiovascular;  Laterality: N/A;   ROTATOR CUFF REPAIR  2/11   rt   TOTAL HIP ARTHROPLASTY  2011   Left   TOTAL KNEE ARTHROPLASTY  06/01/2012   Procedure: TOTAL KNEE ARTHROPLASTY;  Surgeon: Ninetta Lights, MD;  Location: [facility];  Service: Orthopedics;  Laterality: Right;    Current Meds  Medication Sig   atorvastatin (LIPITOR) 40 MG tablet TAKE 1 TABLET BY MOUTH EVERY DAY   carvedilol (COREG) 12.5 MG tablet TAKE 1 TABLET BY MOUTH TWICE A DAY   dapagliflozin propanediol (FARXIGA) 5 MG TABS tablet Take 1 tablet (5 mg total) by mouth daily before breakfast.   doxazosin (CARDURA) 1 MG tablet TAKE 1 TABLET BY MOUTH EVERY DAY   esomeprazole (NEXIUM) 20 MG capsule Take 20 mg by mouth daily at 12 noon.   fish oil-omega-3 fatty acids 1000 MG capsule Take 1 g by mouth 2 (two) times daily.    glucose blood test strip Use daily to check sugar. E11.9.  One touch test strips.   losartan (COZAAR) 25 MG tablet TAKE 1 TABLET (25 MG) BY MOUTH ONCE DAILY AT BEDTIME   meloxicam  (MOBIC) 15 MG tablet Take 1 tablet (15 mg total) by mouth daily.   metFORMIN (GLUCOPHAGE) 500 MG tablet Take 2 tablets (1,000 mg total) by mouth 2 (two) times daily with a meal.   omeprazole (PRILOSEC) 20 MG capsule Take 1 capsule (20 mg total) by mouth daily.   PARoxetine (PAXIL) 20 MG tablet TAKE 1 TABLET BY MOUTH EVERY DAY   spironolactone (ALDACTONE) 25 MG tablet TAKE 1/2  TABLET BY MOUTH EVERY DAY    No Known Allergies  Review of Systems negative except from HPI and PMH  Physical Exam BP 110/70   Pulse 85   Ht '5\' 10"'$  (1.778 m)   Wt 223 lb 9.6 oz (101.4 kg)   SpO2 96%   BMI 32.08 kg/m  Well developed and nourished in no acute distress HENT normal Neck supple with JVP-  flat   Clear Regular rate and rhythm, no murmurs or gallops Abd-soft with active BS No Clubbing cyanosis edema Skin-warm and dry A & Oriented  Grossly normal sensory and motor function  ECG sinus at 85 Interval 15/14/41 Right bundle branch block Otherwise normal    Assessment and  Plan Nonischemic cardiomyopathy  Congestive heart failure-chronic-systolic class II   Bigeminal wide-complex beat,  relatively narrow with a prolonged intrinsicoid deflection and a fused appearance suggestive of a junctional trigger capturing the intrinsic conduction system.  Ablation [place] 5/21; [place] 10/21  High Risk Medication Surveillance  Aldactone \ Sleep disordered breathing/sleep apnea   Diabetes   Hypertension  Orthostatic hypotension  Hypomagnesemia/hypokalemia    Blood pressure well controlled continue the Cozaar 25 Cardura 1 carvedilol 12.5 spironolactone 12.5. Orthostasis is quiescient.  Potassium magnesium have been normal.  Normalization of LV function.  Continue Farxiga.  No recurrent PVCs of which he is aware following ablation

## 2022-05-16 ENCOUNTER — Other Ambulatory Visit: Payer: Self-pay | Admitting: Family Medicine

## 2022-05-16 ENCOUNTER — Other Ambulatory Visit: Payer: Self-pay | Admitting: Internal Medicine

## 2022-05-21 ENCOUNTER — Ambulatory Visit (INDEPENDENT_AMBULATORY_CARE_PROVIDER_SITE_OTHER): Payer: Medicare HMO

## 2022-05-21 VITALS — Ht 70.0 in | Wt 220.0 lb

## 2022-05-21 DIAGNOSIS — Z0000 Encounter for general adult medical examination without abnormal findings: Secondary | ICD-10-CM

## 2022-05-21 NOTE — Patient Instructions (Signed)
Tim Mccullough , Thank you for taking time to come for your Medicare Wellness Visit. I appreciate your ongoing commitment to your health goals. Please review the following plan we discussed and let me know if I can assist you in the future.   Screening recommendations/referrals: Colonoscopy: completed 02/26/2016, due 02/25/2026 Recommended yearly ophthalmology/optometry visit for glaucoma screening and checkup Recommended yearly dental visit for hygiene and checkup  Vaccinations: Influenza vaccine: due 06/09/2022 Pneumococcal vaccine: completed 10/20/2018 Tdap vaccine: completed 04/08/2013, due 04/09/2023 Shingles vaccine: discussed   Covid-19:  09/15/2020, 01/19/2020, 12/23/2019  Advanced directives: Advance directive discussed with you today.   Conditions/risks identified: none  Next appointment: Follow up in one year for your annual wellness visit.   Preventive Care 70 Years and Older, Male Preventive care refers to lifestyle choices and visits with your health care provider that can promote health and wellness. What does preventive care include? A yearly physical exam. This is also called an annual well check. Dental exams once or twice a year. Routine eye exams. Ask your health care provider how often you should have your eyes checked. Personal lifestyle choices, including: Daily care of your teeth and gums. Regular physical activity. Eating a healthy diet. Avoiding tobacco and drug use. Limiting alcohol use. Practicing safe sex. Taking low doses of aspirin every day. Taking vitamin and mineral supplements as recommended by your health care provider. What happens during an annual well check? The services and screenings done by your health care provider during your annual well check will depend on your age, overall health, lifestyle risk factors, and family history of disease. Counseling  Your health care provider may ask you questions about your: Alcohol use. Tobacco use. Drug  use. Emotional well-being. Home and relationship well-being. Sexual activity. Eating habits. History of falls. Memory and ability to understand (cognition). Work and work Statistician. Screening  You may have the following tests or measurements: Height, weight, and BMI. Blood pressure. Lipid and cholesterol levels. These may be checked every 5 years, or more frequently if you are over 22 years old. Skin check. Lung cancer screening. You may have this screening every year starting at age 51 if you have a 30-pack-year history of smoking and currently smoke or have quit within the past 15 years. Fecal occult blood test (FOBT) of the stool. You may have this test every year starting at age 17. Flexible sigmoidoscopy or colonoscopy. You may have a sigmoidoscopy every 5 years or a colonoscopy every 10 years starting at age 22. Prostate cancer screening. Recommendations will vary depending on your family history and other risks. Hepatitis C blood test. Hepatitis B blood test. Sexually transmitted disease (STD) testing. Diabetes screening. This is done by checking your blood sugar (glucose) after you have not eaten for a while (fasting). You may have this done every 1-3 years. Abdominal aortic aneurysm (AAA) screening. You may need this if you are a current or former smoker. Osteoporosis. You may be screened starting at age 41 if you are at high risk. Talk with your health care provider about your test results, treatment options, and if necessary, the need for more tests. Vaccines  Your health care provider may recommend certain vaccines, such as: Influenza vaccine. This is recommended every year. Tetanus, diphtheria, and acellular pertussis (Tdap, Td) vaccine. You may need a Td booster every 10 years. Zoster vaccine. You may need this after age 15. Pneumococcal 13-valent conjugate (PCV13) vaccine. One dose is recommended after age 37. Pneumococcal polysaccharide (PPSV23) vaccine. One dose  is  recommended after age 63. Talk to your health care provider about which screenings and vaccines you need and how often you need them. This information is not intended to replace advice given to you by your health care provider. Make sure you discuss any questions you have with your health care provider. Document Released: 11/22/2015 Document Revised: 07/15/2016 Document Reviewed: 08/27/2015 Elsevier Interactive Patient Education  2017 [place] Prevention in the Home Falls can cause injuries. They can happen to people of all ages. There are many things you can do to make your home safe and to help prevent falls. What can I do on the outside of my home? Regularly fix the edges of walkways and driveways and fix any cracks. Remove anything that might make you trip as you walk through a door, such as a raised step or threshold. Trim any bushes or trees on the path to your home. Use bright outdoor lighting. Clear any walking paths of anything that might make someone trip, such as rocks or tools. Regularly check to see if handrails are loose or broken. Make sure that both sides of any steps have handrails. Any raised decks and porches should have guardrails on the edges. Have any leaves, snow, or ice cleared regularly. Use sand or salt on walking paths during winter. Clean up any spills in your garage right away. This includes oil or grease spills. What can I do in the bathroom? Use night lights. Install grab bars by the toilet and in the tub and shower. Do not use towel bars as grab bars. Use non-skid mats or decals in the tub or shower. If you need to sit down in the shower, use a plastic, non-slip stool. Keep the floor dry. Clean up any water that spills on the floor as soon as it happens. Remove soap buildup in the tub or shower regularly. Attach bath mats securely with double-sided non-slip rug tape. Do not have throw rugs and other things on the floor that can make you  trip. What can I do in the bedroom? Use night lights. Make sure that you have a light by your bed that is easy to reach. Do not use any sheets or blankets that are too big for your bed. They should not hang down onto the floor. Have a firm chair that has side arms. You can use this for support while you get dressed. Do not have throw rugs and other things on the floor that can make you trip. What can I do in the kitchen? Clean up any spills right away. Avoid walking on wet floors. Keep items that you use a lot in easy-to-reach places. If you need to reach something above you, use a strong step stool that has a grab bar. Keep electrical cords out of the way. Do not use floor polish or wax that makes floors slippery. If you must use wax, use non-skid floor wax. Do not have throw rugs and other things on the floor that can make you trip. What can I do with my stairs? Do not leave any items on the stairs. Make sure that there are handrails on both sides of the stairs and use them. Fix handrails that are broken or loose. Make sure that handrails are as long as the stairways. Check any carpeting to make sure that it is firmly attached to the stairs. Fix any carpet that is loose or worn. Avoid having throw rugs at the top or bottom of the stairs.  If you do have throw rugs, attach them to the floor with carpet tape. Make sure that you have a light switch at the top of the stairs and the bottom of the stairs. If you do not have them, ask someone to add them for you. What else can I do to help prevent falls? Wear shoes that: Do not have high heels. Have rubber bottoms. Are comfortable and fit you well. Are closed at the toe. Do not wear sandals. If you use a stepladder: Make sure that it is fully opened. Do not climb a closed stepladder. Make sure that both sides of the stepladder are locked into place. Ask someone to hold it for you, if possible. Clearly mark and make sure that you can  see: Any grab bars or handrails. First and last steps. Where the edge of each step is. Use tools that help you move around (mobility aids) if they are needed. These include: Canes. Walkers. Scooters. Crutches. Turn on the lights when you go into a dark area. Replace any light bulbs as soon as they burn out. Set up your furniture so you have a clear path. Avoid moving your furniture around. If any of your floors are uneven, fix them. If there are any pets around you, be aware of where they are. Review your medicines with your doctor. Some medicines can make you feel dizzy. This can increase your chance of falling. Ask your doctor what other things that you can do to help prevent falls. This information is not intended to replace advice given to you by your health care provider. Make sure you discuss any questions you have with your health care provider. Document Released: 08/22/2009 Document Revised: 04/02/2016 Document Reviewed: 11/30/2014 Elsevier Interactive Patient Education  2017 [employer].

## 2022-05-21 NOTE — Progress Notes (Signed)
I connected with Tim Mccullough today by telephone and verified that I am speaking with the correct person using two identifiers. Location patient: home Location provider: work Persons participating in the virtual visit: Tim Mccullough, Glenna Durand LPN.   I discussed the limitations, risks, security and privacy concerns of performing an evaluation and management service by telephone and the availability of in person appointments. I also discussed with the patient that there may be a patient responsible charge related to this service. The patient expressed understanding and verbally consented to this telephonic visit.    Interactive audio and video telecommunications were attempted between this provider and patient, however failed, due to patient having technical difficulties OR patient did not have access to video capability.  We continued and completed visit with audio only.     Vital signs may be patient reported or missing.  Subjective:   Tim Mccullough is a 71 y.o. male who presents for Medicare Annual/Subsequent preventive examination.  Review of Systems     Cardiac Risk Factors include: advanced age (>59mn, >>83women);diabetes mellitus;dyslipidemia;hypertension;male gender;obesity (BMI >30kg/m2)     Objective:    Today's Vitals   05/21/22 1126  Weight: 220 lb (99.8 kg)  Height: '5\' 10"'$  (1.778 m)   Body mass index is 31.57 kg/m.     05/21/2022   11:37 AM 10/31/2021    1:10 PM 01/18/2021    9:16 PM 03/18/2020    5:57 AM 01/29/2020    1:46 PM 01/04/2020   10:36 AM 12/25/2019   12:46 PM  Advanced Directives  Does Patient Have a Medical Advance Directive? No No No No No No No  Would patient like information on creating a medical advance directive?    Yes (MAU/Ambulatory/Procedural Areas - Information given) No - Patient declined Yes (ED - Information included in AVS) Yes (ED - Information included in AVS)    Current Medications (verified) Outpatient Encounter Medications  as of 05/21/2022  Medication Sig   aspirin EC 81 MG tablet Take 81 mg by mouth daily. Swallow whole.   atorvastatin (LIPITOR) 40 MG tablet TAKE 1 TABLET BY MOUTH EVERY DAY   carvedilol (COREG) 12.5 MG tablet TAKE 1 TABLET BY MOUTH TWICE A DAY   dapagliflozin propanediol (FARXIGA) 5 MG TABS tablet Take 1 tablet (5 mg total) by mouth daily before breakfast.   doxazosin (CARDURA) 1 MG tablet TAKE 1 TABLET BY MOUTH EVERY DAY   esomeprazole (NEXIUM) 20 MG capsule Take 20 mg by mouth daily at 12 noon.   fish oil-omega-3 fatty acids 1000 MG capsule Take 1 g by mouth 2 (two) times daily.    losartan (COZAAR) 25 MG tablet TAKE 1 TABLET (25 MG) BY MOUTH ONCE DAILY AT BEDTIME   meloxicam (MOBIC) 15 MG tablet Take 1 tablet (15 mg total) by mouth daily.   metFORMIN (GLUCOPHAGE) 500 MG tablet Take 2 tablets (1,000 mg total) by mouth 2 (two) times daily with a meal.   ONETOUCH ULTRA test strip USE DAILY TO CHECK SUGAR. E11.9. ONE TOUCH TEST STRIPS.   PARoxetine (PAXIL) 20 MG tablet TAKE 1 TABLET BY MOUTH EVERY DAY   spironolactone (ALDACTONE) 25 MG tablet TAKE 1/2 TABLET BY MOUTH EVERY DAY   omeprazole (PRILOSEC) 20 MG capsule Take 1 capsule (20 mg total) by mouth daily. (Patient not taking: Reported on 05/21/2022)   No facility-administered encounter medications on file as of 05/21/2022.    Allergies (verified) Patient has no known allergies.   History: Past Medical History:  Diagnosis  Date   Abnormal echocardiogram 12/2009   Repeated after med mgmt of cardiomyopathy showed EF 40-45%, mildly dilated LV, mild LV hypertrophy, anterolateral and apical hypokinesis, mild diastolic dysfunction.   Anemia    hx   Anxiety    Arthritis    Blood transfusion without reported diagnosis 09/2012   BPH (benign prostatic hypertrophy) 2004   Cancer [facility])    melanoma back   Cardiomyopathy    ? tachycardia-mediated   Depression    Diabetes mellitus 11/2006   Type II   Diverticulosis    Echocardiogram abnormal  05/2009   Mild LVH with a mildly dilated LV.  EF was difficult to assess given poor acoustic windows but appeared moderately decreased.  Mild MR.  Probable small perimembranous VSD, mild LAE   Esophageal erosions    GERD (gastroesophageal reflux disease)    H/O cardiac radiofrequency ablation    Hip pain    History of MRI 05/2009   Cardiac MRI was done to followup difficult echo.  This showed mild to moderately dilated LV, mild to moderate LAE, EF  33% with global hypokinesis, normal RV size with mild systolic dysfunction.  There was no large VSD evident.  There was no myocardial delayed enhancement so no evidence for infiltrative disease or infarction.  LHC (7/10) showed only luminal irreg. in the coronaries and EF 35%   Holter monitor, abnormal 05/2009   Frequent PAC's and PVC's  Average HR 96  (but he had started Toprol XL).  HR range 71-130   Hyperlipidemia 04/1998   Hypertension 1995   Obesity    OSA (obstructive sleep apnea)    Not using CPAP- improved with weight loss   Pneumonia    HX OF pna   Sinus tachycardia    Pt says he has been told his heart rate is high since he was in high school.    Sleep apnea    has cpap; but does not use   VSD (ventricular septal defect)    Probable small perimembranous   Past Surgical History:  Procedure Laterality Date   CARDIAC CATHETERIZATION  05/30/2009   Dilated cardiomyopathy.  min VSD.  Hypokin Inf  wall.  Multiple min luminal Irr.  EF 35%   CATARACT EXTRACTION, BILATERAL     COLONOSCOPY W/ BIOPSIES  08/24/2006   Divertics, polyps x3, bx negative, repeat 2012 with mild diverticulosis in teh sigmoid colon and 1 polyp in tranverse colon, repeat due 2017   ESOPHAGOGASTRODUODENOSCOPY  2012   1)Barrett's esoph, 2) Erosive esophagitis, 3) Mild gastritis in the antrum, 4) Duodenitis in the bulb of duodenum, 5) Small hiatal hernia   INGUINAL HERNIA REPAIR  1985   Left   INGUINAL HERNIA REPAIR  1999   Right   KNEE ARTHROSCOPY  10/15/2011    Procedure: ARTHROSCOPY KNEE;  Surgeon: Ninetta Lights, MD;  Location: [place];  Service: Orthopedics;  Laterality: Right;  right knee scope with lateral meniscectomy, removal loose foreign body, and microfracture technique   LAMINECTOMY  1987   with discectomy   PVC ABLATION N/A 03/18/2020   Procedure: PVC ABLATION;  Surgeon: Evans Lance, MD;  Location: [place] [facility];  Service: Cardiovascular;  Laterality: N/A;   REVERSE SHOULDER ARTHROPLASTY Left 11/19/2021   Procedure: REVERSE SHOULDER ARTHROPLASTY;  Surgeon: Hiram Gash, MD;  Location: [facility];  Service: Orthopedics;  Laterality: Left;   RIGHT/LEFT HEART CATH AND CORONARY ANGIOGRAPHY N/A 12/25/2019   Procedure: RIGHT/LEFT HEART CATH AND CORONARY ANGIOGRAPHY;  Surgeon: Wellington Hampshire, MD;  Location: [place] [facility];  Service: Cardiovascular;  Laterality: N/A;   ROTATOR CUFF REPAIR  2/11   rt   TOTAL HIP ARTHROPLASTY  2011   Left   TOTAL KNEE ARTHROPLASTY  06/01/2012   Procedure: TOTAL KNEE ARTHROPLASTY;  Surgeon: Ninetta Lights, MD;  Location: [place];  Service: Orthopedics;  Laterality: Right;   Family History  Problem Relation Age of Onset   Heart disease Father        CABG   Stroke Father    Diabetes Father    Hypertension Mother    Alzheimer's disease Mother    Diabetes Sister    Hypertension Sister    Cancer Sister        uterine cancer   Diabetes Brother    Throat cancer Brother        Throat   Esophageal cancer Brother    Diabetes Sister    Hypertension Sister    Throat cancer Other        Throat   Diabetes Other    Alcohol abuse Other    Diabetes Maternal Grandmother        Insulin   Colon cancer Cousin    Esophageal cancer Maternal Grandfather    Drug abuse Neg Hx    Prostate cancer Neg Hx    Rectal cancer Neg Hx    Social History   Socioeconomic History   Marital status: Single    Spouse name: Not on file   Number of children: 3   Years of education: Not on file    Highest education level: Not on file  Occupational History   Occupation: Systems analyst: Astronomer   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  Tobacco Use   Smoking status: Former    Years: 20.00    Types: Cigarettes    Quit date: 11/09/1998    Years since quitting: 23.5   Smokeless tobacco: Former    Quit date: 06/03/2007   Tobacco comments:    no alcohol since nov 12  Vaping Use   Vaping Use: Never used  Substance and Sexual Activity   Alcohol use: No    Alcohol/week: 0.0 standard drinks of alcohol    Comment: sober as 09/2011   Drug use: No   Sexual activity: Not Currently  Other Topics Concern   Not on file  Social History Narrative   Lives in [place], lives alone   3 kids (2 sons, 1 daughter)   Divorced   Social Determinants of Health   Financial Resource Strain: Low Risk  (05/21/2022)   Overall Financial Resource Strain (CARDIA)    Difficulty of Paying Living Expenses: Not hard at all  Food Insecurity: No Food Insecurity (05/21/2022)   Hunger Vital Sign    Worried About Running Out of Food in the Last Year: Never true    Ran Out of Food in the Last Year: Never true  Transportation Needs: No Transportation Needs (05/21/2022)   PRAPARE - Hydrologist (Medical): No    Lack of Transportation (Non-Medical): No  Physical Activity: Inactive (05/21/2022)   Exercise Vital Sign    Days of Exercise per Week: 0 days    Minutes of Exercise per Session: 0 min  Stress: No Stress Concern Present (05/21/2022)   [place]    Feeling of Stress : Not at all  Social Connections: Not on file  Tobacco Counseling Counseling given: Not Answered Tobacco comments: no alcohol since nov 12   Clinical Intake:  Pre-visit preparation completed: Yes  Pain : No/denies pain     Nutritional Status: BMI > 30  Obese Nutritional Risks: None Diabetes: Yes  How often do you need to  have someone help you when you read instructions, pamphlets, or other written materials from your doctor or pharmacy?: 1 - Never What is the last grade level you completed in school?: technical school  Diabetic? Yes Nutrition Risk Assessment:  Has the patient had any N/V/D within the last 2 months?  No  Does the patient have any non-healing wounds?  No  Has the patient had any unintentional weight loss or weight gain?  No   Diabetes:  Is the patient diabetic?  Yes  If diabetic, was a CBG obtained today?  No  Did the patient bring in their glucometer from home?  No  How often do you monitor your CBG's? daily.   Financial Strains and Diabetes Management:  Are you having any financial strains with the device, your supplies or your medication? No .  Does the patient want to be seen by Chronic Care Management for management of their diabetes?  No  Would the patient like to be referred to a Nutritionist or for Diabetic Management?  No   Diabetic Exams:  Diabetic Eye Exam: Overdue for diabetic eye exam. Pt has been advised about the importance in completing this exam. Patient advised to call and schedule an eye exam. Diabetic Foot Exam: Completed 04/23/2022   Interpreter Needed?: No  Information entered by :: NAllen LPN   Activities of Daily Living    05/21/2022   11:37 AM 10/31/2021    1:14 PM  In your present state of health, do you have any difficulty performing the following activities:  Hearing? 0   Vision? 0   Difficulty concentrating or making decisions? 0   Walking or climbing stairs? 0   Dressing or bathing? 0   Doing errands, shopping? 0 0  Preparing Food and eating ? N   Using the Toilet? N   In the past six months, have you accidently leaked urine? N   Do you have problems with loss of bowel control? N   Managing your Medications? N   Managing your Finances? N   Housekeeping or managing your Housekeeping? N     Patient Care Team: Tonia Ghent, MD as PCP  - General (Family Medicine) Wellington Hampshire, MD as PCP - Cardiology (Cardiology)  Indicate any recent Medical Services you may have received from other than [facility] providers in the past year (date may be approximate).     Assessment:   This is a routine wellness examination for [place].  Hearing/Vision screen Vision Screening - Comments:: Regular eye exams, [employer]  Dietary issues and exercise activities discussed: Current Exercise Habits: The patient does not participate in regular exercise at present   Goals Addressed             This Visit's Progress    Patient Stated       05/21/2022, wants to lose 20-30 pounds       Depression Screen    05/21/2022   11:37 AM 07/03/2021   12:42 PM 03/14/2020    1:36 PM 02/05/2020    1:07 PM 01/08/2020    1:06 PM 04/26/2019    8:10 AM 04/08/2018    9:14 AM  PHQ 2/9 Scores  PHQ - 2  Score 0 0 '2 2 1 '$ 0 0  PHQ- 9 Score  0 '5 6 5 '$ 0 0    Fall Risk    05/21/2022   11:37 AM 04/23/2022   12:31 PM 03/03/2021    2:08 PM 01/04/2020   10:36 AM 04/26/2019    8:10 AM  Fall Risk   Falls in the past year? 0 0 0 0 0  Number falls in past yr: 0 0 0    Injury with Fall? 0 0 0    Risk for fall due to : Medication side effect History of fall(s)     Follow up Falls evaluation completed;Education provided;Falls prevention discussed Falls evaluation completed Falls evaluation completed      FALL RISK PREVENTION PERTAINING TO THE HOME:  Any stairs in or around the home? No  If so, are there any without handrails?  N/a Home free of loose throw rugs in walkways, pet beds, electrical cords, etc? Yes  Adequate lighting in your home to reduce risk of falls? Yes   ASSISTIVE DEVICES UTILIZED TO PREVENT FALLS:  Life alert? No  Use of a cane, walker or w/c? No  Grab bars in the bathroom? Yes  Shower chair or bench in shower? No  Elevated toilet seat or a handicapped toilet? Yes   TIMED UP AND GO:  Was the test performed? No .      Cognitive  Function:    04/26/2019    9:33 AM 04/08/2018    9:15 AM  MMSE - Mini Mental State Exam  Orientation to time 5 5  Orientation to Place 5 5  Registration 3 3  Attention/ Calculation 0 0  Recall 3 1  Recall-comments  unable to recall 2 of 3 words  Language- name 2 objects 0 0  Language- repeat 1 1  Language- follow 3 step command 0 3  Language- read & follow direction 0 0  Write a sentence 0 0  Copy design 0 0  Total score 17 18        05/21/2022   11:39 AM  6CIT Screen  What Year? 0 points  What month? 0 points  What time? 0 points  Count back from 20 0 points  Months in reverse 0 points  Repeat phrase 2 points  Total Score 2 points    Immunizations Immunization History  Administered Date(s) Administered   Fluad Quad(high Dose 65+) 08/28/2019   Influenza Whole 08/28/1998, 08/09/2010   Influenza,inj,Quad PF,6+ Mos 07/28/2013, 08/17/2014, 08/20/2015, 08/24/2016, 08/23/2017, 07/18/2018   Influenza-Unspecified 08/09/2021   Moderna Sars-Covid-2 Vaccination 12/23/2019, 01/19/2020, 09/15/2020   Pneumococcal Conjugate-13 08/23/2017   Pneumococcal Polysaccharide-23 06/03/2012, 10/20/2018   Pneumococcal-Unspecified 06/01/2012   Td 11/09/1997   Tdap 08/07/2011, 04/08/2013    TDAP status: Up to date  Flu Vaccine status: Up to date  Pneumococcal vaccine status: Up to date  Covid-19 vaccine status: Completed vaccines  Qualifies for Shingles Vaccine? Yes   Zostavax completed No   Shingrix Completed?: No.    Education has been provided regarding the importance of this vaccine. Patient has been advised to call insurance company to determine out of pocket expense if they have not yet received this vaccine. Advised may also receive vaccine at local pharmacy or Health Dept. Verbalized acceptance and understanding.  Screening Tests Health Maintenance  Topic Date Due   Zoster Vaccines- Shingrix (1 of 2) Never done   COVID-19 Vaccine (4 - Booster for Moderna series) 11/10/2020    OPHTHALMOLOGY EXAM  03/05/2022  INFLUENZA VACCINE  06/09/2022   HEMOGLOBIN A1C  10/23/2022   TETANUS/TDAP  04/09/2023   FOOT EXAM  04/24/2023   COLONOSCOPY (Pts 45-70yr Insurance coverage will need to be confirmed)  02/25/2026   Pneumonia Vaccine 70 Years old  Completed   Hepatitis C Screening  Completed   HPV VACCINES  Aged Out    Health Maintenance  Health Maintenance Due  Topic Date Due   Zoster Vaccines- Shingrix (1 of 2) Never done   COVID-19 Vaccine (4 - Booster for Moderna series) 11/10/2020   OPHTHALMOLOGY EXAM  03/05/2022    Colorectal cancer screening: Type of screening: Colonoscopy. Completed 02/26/2016. Repeat every 10 years  Lung Cancer Screening: (Low Dose CT Chest recommended if Age 70-80years, 30 pack-year currently smoking OR have quit w/in 15years.) does not qualify.   Lung Cancer Screening Referral: no  Additional Screening:  Hepatitis C Screening: does qualify; Completed 03/02/2016  Vision Screening: Recommended annual ophthalmology exams for early detection of glaucoma and other disorders of the eye. Is the patient up to date with their annual eye exam?  Yes  Who is the provider or what is the name of the office in which the patient attends annual eye exams? [facility] [facility] is not established with a provider, would they like to be referred to a provider to establish care? No .   Dental Screening: Recommended annual dental exams for proper oral hygiene  Community Resource Referral / Chronic Care Management: CRR required this visit?  No   CCM required this visit?  No      Plan:     I have personally reviewed and noted the following in the patient's chart:   Medical and social history Use of alcohol, tobacco or illicit drugs  Current medications and supplements including opioid prescriptions. Patient is not currently taking opioid prescriptions. Functional ability and status Nutritional status Physical activity Advanced  directives List of other physicians Hospitalizations, surgeries, and ER visits in previous 12 months Vitals Screenings to include cognitive, depression, and falls Referrals and appointments  In addition, I have reviewed and discussed with patient certain preventive protocols, quality metrics, and best practice recommendations. A written personalized care plan for preventive services as well as general preventive health recommendations were provided to patient.     NKellie Simmering LPN   [id number]/[id number]  Nurse Notes: none  Due to this being a virtual visit, the after visit summary with patients personalized plan was offered to patient via mail or my-chart.  Patient preferred to pick up at office at next visit

## 2022-05-24 ENCOUNTER — Other Ambulatory Visit: Payer: Self-pay | Admitting: Internal Medicine

## 2022-06-26 ENCOUNTER — Other Ambulatory Visit: Payer: Self-pay | Admitting: Family Medicine

## 2022-07-02 ENCOUNTER — Other Ambulatory Visit: Payer: Self-pay | Admitting: Family Medicine

## 2022-07-20 ENCOUNTER — Other Ambulatory Visit: Payer: Self-pay | Admitting: Family Medicine

## 2022-07-20 ENCOUNTER — Telehealth: Payer: Self-pay

## 2022-07-20 NOTE — Patient Instructions (Signed)
Visit Information  Thank you for taking time to visit with me today. Please don't hesitate to contact me if I can be of assistance to you.   Following are the goals we discussed today:   Goals Addressed               This Visit's Progress     Manage my Diabetes (pt-stated)        Care Coordination Interventions: Reviewed medications with patient and discussed importance of medication adherence Counseled on importance of regular laboratory monitoring as prescribed Discussed plans with patient for ongoing care management follow up and provided patient with direct contact information for care management team Assessed social determinant of health barriers          Our next appointment is by telephone on 08/27/22 at 10am  Please call the care guide team at [phone] if you need to cancel or reschedule your appointment.   If you are experiencing a Mental Health or [place] or need someone to talk to, please call the [place] National Suicide Prevention Lifeline: 431-624-5508 or TTY: [phone] TTY (949)499-9008) to talk to a trained counselor  The patient verbalized understanding of instructions, educational materials, and care plan provided today and DECLINED offer to receive copy of patient instructions, educational materials, and care plan.   Telephone follow up appointment with care management team member scheduled for: The patient has been provided with contact information for the care management team and has been advised to call with any health related questions or concerns.

## 2022-07-20 NOTE — Patient Outreach (Signed)
  Care Coordination   Initial Visit Note   07/20/2022 Name: MARSHAUN LORTIE MRN: [id number] DOB: 1950/12/16  HARLES EVETTS is a 71 y.o. year old male who sees Tonia Ghent, MD for primary care.  Spoke with  [name] by phone today.  What matters to the patients health and wellness today?  Patient states that he was recently started on Farxiga for Diabetes. However, he can not tell a difference in his blood sugars. States cbgs still ranging in the 200's and on some occassions in the 300's. Last A1C on file 8.9. He goes for PCP follow up appt to discuss DM mgmt on 07/28/22. Patient states he is adhering to Diabetic diet and cut out bread, soda and most all high carb foods.    Goals Addressed               This Visit's Progress     Manage my Diabetes (pt-stated)        Care Coordination Interventions: Reviewed medications with patient and discussed importance of medication adherence Counseled on importance of regular laboratory monitoring as prescribed Discussed plans with patient for ongoing care management follow up and provided patient with direct contact information for care management team Assessed social determinant of health barriers          SDOH assessments and interventions completed:  Yes  SDOH Interventions Today    Flowsheet Row Most Recent Value  SDOH Interventions   Food Insecurity Interventions Intervention Not Indicated  Transportation Interventions Intervention Not Indicated        Care Coordination Interventions Activated:  Yes  Care Coordination Interventions:  Yes, provided   Follow up plan: Follow up call scheduled for 08/27/22-10am    Encounter Outcome:  Pt. Visit Completed   Enzo Montgomery, RN,BSN,CCM [place] [facility] Telephonic Care Management Coordinator Direct Phone: [phone] Toll Free: [phone] Fax: [phone]

## 2022-07-28 ENCOUNTER — Ambulatory Visit: Payer: Medicare HMO | Admitting: Family Medicine

## 2022-08-04 ENCOUNTER — Encounter: Payer: Self-pay | Admitting: Family Medicine

## 2022-08-04 ENCOUNTER — Ambulatory Visit (INDEPENDENT_AMBULATORY_CARE_PROVIDER_SITE_OTHER): Payer: Medicare HMO | Admitting: Family Medicine

## 2022-08-04 ENCOUNTER — Telehealth: Payer: Self-pay | Admitting: Family Medicine

## 2022-08-04 VITALS — BP 102/64 | HR 92 | Temp 97.3°F | Ht 70.0 in | Wt 218.0 lb

## 2022-08-04 DIAGNOSIS — E119 Type 2 diabetes mellitus without complications: Secondary | ICD-10-CM | POA: Diagnosis not present

## 2022-08-04 DIAGNOSIS — Z23 Encounter for immunization: Secondary | ICD-10-CM

## 2022-08-04 LAB — POCT GLYCOSYLATED HEMOGLOBIN (HGB A1C): Hemoglobin A1C: 9.2 % — AB (ref 4.0–5.6)

## 2022-08-04 NOTE — Progress Notes (Unsigned)
Diabetes:  Using medications without difficulties: yes Hypoglycemic episodes:no Hyperglycemic episodes:no Feet problems:no Blood Sugars averaging: ~200.  eye exam within last year: due, d/w pt.  He can check on that   A1c d/w pt at [place].   He is on unrecalled dose of jardiance.  Sugar has increased at home in spite of stays on diet.  Sugar has been ~200.  See avs.    He had a bridge but lost the L upper teeth with dental f/u pending.   Ozempic cautions d/w pt. we talked about Ozempic versus insulin.  Meds, vitals, and allergies reviewed.  ROS: Per HPI unless specifically indicated in ROS section   GEN: nad, alert and oriented HEENT: ncat NECK: supple w/o LA CV: rrr. PULM: ctab, no inc wob ABD: soft, +bs EXT: no edema SKIN: no acute rash

## 2022-08-04 NOTE — Patient Instructions (Addendum)
Update me about your dose of jardiance.  Check your bottle at the house and let me know.  Take care.  Glad to see you. Recheck A1c at a visit in about 3 months.   Let me check with pharmacy here in the meantime.

## 2022-08-04 NOTE — Telephone Encounter (Signed)
Pt called & stated he seen Dr. Damita Dunnings for ov today & he was calling to let Dr. Damita Dunnings know that for the [facility] meds, it is '5mg'$  & not 10 mg. Call back # [phone].

## 2022-08-05 MED ORDER — DAPAGLIFLOZIN PROPANEDIOL 5 MG PO TABS: 5.0000 mg | ORAL_TABLET | Freq: Every day | ORAL | Status: DC

## 2022-08-05 NOTE — Telephone Encounter (Addendum)
Duly noted.  Jessica-please have patient continue his medications as is for now.  I am checking with pharmacy staff in the meantime.  Mendel Ryder- would this patient be a candidate for Ozempic and could he get any help with that?  I gave him the routine Ozempic cautions and this may be a reasonable option for him.  I would like your input.  Thanks.

## 2022-08-05 NOTE — Assessment & Plan Note (Signed)
Current Outpatient Medications on File Prior to Visit  Medication Sig Dispense Refill  . aspirin EC 81 MG tablet Take 81 mg by mouth daily. Swallow whole.    Marland Kitchen atorvastatin (LIPITOR) 40 MG tablet TAKE 1 TABLET BY MOUTH EVERY DAY 30 tablet 0  . carvedilol (COREG) 12.5 MG tablet TAKE 1 TABLET BY MOUTH TWICE A DAY 180 tablet 3  . doxazosin (CARDURA) 1 MG tablet TAKE 1 TABLET BY MOUTH EVERY DAY 30 tablet 0  . esomeprazole (NEXIUM) 20 MG capsule Take 20 mg by mouth daily at 12 noon.    . fish oil-omega-3 fatty acids 1000 MG capsule Take 1 g by mouth 2 (two) times daily.     Marland Kitchen losartan (COZAAR) 25 MG tablet TAKE 1 TABLET (25 MG) BY MOUTH ONCE DAILY AT BEDTIME 90 tablet 3  . meloxicam (MOBIC) 15 MG tablet Take 1 tablet (15 mg total) by mouth daily. 30 tablet 0  . metFORMIN (GLUCOPHAGE) 500 MG tablet Take 2 tablets (1,000 mg total) by mouth 2 (two) times daily with a meal. 360 tablet 3  . omeprazole (PRILOSEC) 20 MG capsule Take 1 capsule (20 mg total) by mouth daily. 30 capsule 11  . ONETOUCH ULTRA test strip USE DAILY TO CHECK SUGAR. E11.9. ONE TOUCH TEST STRIPS. 100 strip 12  . PARoxetine (PAXIL) 20 MG tablet TAKE 1 TABLET BY MOUTH EVERY DAY 90 tablet 3  . spironolactone (ALDACTONE) 25 MG tablet TAKE 1/2 TABLET BY MOUTH EVERY DAY 45 tablet 6   No current facility-administered medications on file prior to visit.    No change in medications at this point but I will check with pharmacy staff here.  See following phone note.  I asked him to update his medication list in the meantime.

## 2022-08-05 NOTE — Addendum Note (Signed)
Addended by: Tonia Ghent on: 08/05/2022 11:29 PM   Modules accepted: Orders

## 2022-08-07 MED ORDER — OZEMPIC (0.25 OR 0.5 MG/DOSE) 2 MG/3ML ~~LOC~~ SOPN: 0.2500 mg | PEN_INJECTOR | SUBCUTANEOUS | 0 refills | Status: DC

## 2022-08-07 NOTE — Telephone Encounter (Signed)
Spoke with patient and he is okay with all discussed. Patient will wait for [place] to let him know if he can do the ozempic or not.

## 2022-08-07 NOTE — Telephone Encounter (Signed)
Many thanks. 

## 2022-08-07 NOTE — Telephone Encounter (Addendum)
Reviewed chart to assess for Ozempic. Spoke with patient and he should qualify for patient assistance as well.  Diabetes (A1c goal <7%) -Uncontrolled - A1c 9.2% (07/2022), and has been increasing from 8.9% and 7.4% previously this year -Current medications: Farxiga 5 mg daily Metformin 500 mg - 2 tab BID -Medications previously tried: n/a  -Pt is a good candidate for GLP-1 RA - no hx pancreatitis; BMI > 30.  -Recommend to start Ozempic 0.25 mg weekly x 4 weeks -sent 1 month supply to pharmacy for patient to start ASAP. We will pursue PAP through [facility] for further refills.

## 2022-08-07 NOTE — Addendum Note (Signed)
Addended by: Charlton Haws on: 08/07/2022 01:34 PM   Modules accepted: Orders

## 2022-08-26 ENCOUNTER — Other Ambulatory Visit: Payer: Self-pay | Admitting: Family Medicine

## 2022-08-27 ENCOUNTER — Ambulatory Visit: Payer: Self-pay

## 2022-08-27 NOTE — Patient Outreach (Signed)
  Care Coordination   Follow Up Visit Note   08/27/2022 Name: Tim Mccullough MRN: [id number] DOB: April 17, 1951  Tim Mccullough is a 71 y.o. year old male who sees Tonia Ghent, MD for primary care. I spoke with  [name] by phone today.  What matters to the patients health and wellness today?  Patient states he has been taking Ozempic for 3 weeks now.  He reports weight loss of 10 lbs and states his appetite has decreased. Patient states his blood sugar ranges from 150-160 and denies any blood sugars <70.  Patient reports next follow up with primary care provider is 11/05/22.     Goals Addressed               This Visit's Progress     Manage my Diabetes (pt-stated)        Care Coordination Interventions: Reviewed medications with patient and discussed importance of medication adherence Discussed plans with patient for ongoing care management follow up and provided patient with direct contact information for care management team Advised to check blood sugars daily and recheck blood sugar if experiencing symptoms Reviewed treatment for hypo/hyperglycemia Discussed diabetic diet/ diabetic plate method Mailed patient education article for diabetes management/ diet. Reviewed scheduled/ upcoming provider appointments             SDOH assessments and interventions completed:  No     Care Coordination Interventions Activated:  Yes  Care Coordination Interventions:  Yes, provided   Follow up plan: Follow up call scheduled for 10/06/22 at 10:30 am    Encounter Outcome:  Pt. Visit Completed   Quinn Plowman RN,BSN,CCM [place] [phone] direct line

## 2022-08-27 NOTE — Patient Instructions (Addendum)
Visit Information  Thank you for taking time to visit with me today. Please don't hesitate to contact me if I can be of assistance to you.   Following are the goals we discussed today:   Goals Addressed               This Visit's Progress     Manage my Diabetes (pt-stated)        Care Coordination Interventions: Reviewed medications with patient and discussed importance of medication adherence Discussed plans with patient for ongoing care management follow up and provided patient with direct contact information for care management team Advised to check blood sugars daily and recheck blood sugar if experiencing symptoms Reviewed treatment for hypo/hyperglycemia Discussed diabetic diet/ diabetic plate method Mailed patient education article for diabetes management/ diet. Reviewed scheduled/ upcoming provider appointments             Our next appointment is by telephone on 10/06/22 at 10:30 am  Please call the care guide team at [phone] if you need to cancel or reschedule your appointment.   If you are experiencing a Mental Health or [place] or need someone to talk to, please call 1-800-273-TALK (toll free, 24 hour hotline)  The patient verbalized understanding of instructions, educational materials, and care plan provided today and agreed to receive a mailed copy of patient instructions, educational materials, and care plan.   Quinn Plowman RN,BSN,CCM [facility] [phone] direct line  Diabetes Mellitus and Nutrition, Adult When you have diabetes, or diabetes mellitus, it is very important to have healthy eating habits because your blood sugar (glucose) levels are greatly affected by what you eat and drink. Eating healthy foods in the right amounts, at about the same times every day, can help you: Manage your blood glucose. Lower your risk of heart disease. Improve your blood pressure. Reach or maintain a healthy weight. What can  affect my meal plan? Every person with diabetes is different, and each person has different needs for a meal plan. Your health care provider may recommend that you work with a dietitian to make a meal plan that is best for you. Your meal plan may vary depending on factors such as: The calories you need. The medicines you take. Your weight. Your blood glucose, blood pressure, and cholesterol levels. Your activity level. Other health conditions you have, such as heart or kidney disease. How do carbohydrates affect me? Carbohydrates, also called carbs, affect your blood glucose level more than any other type of food. Eating carbs raises the amount of glucose in your blood. It is important to know how many carbs you can safely have in each meal. This is different for every person. Your dietitian can help you calculate how many carbs you should have at each meal and for each snack. How does alcohol affect me? Alcohol can cause a decrease in blood glucose (hypoglycemia), especially if you use insulin or take certain diabetes medicines by mouth. Hypoglycemia can be a life-threatening condition. Symptoms of hypoglycemia, such as sleepiness, dizziness, and confusion, are similar to symptoms of having too much alcohol. Do not drink alcohol if: Your health care provider tells you not to drink. You are pregnant, may be pregnant, or are planning to become pregnant. If you drink alcohol: Limit how much you have to: 0-1 drink a day for women. 0-2 drinks a day for men. Know how much alcohol is in your drink. In the [place] one drink equals one 12 oz bottle of beer (  355 mL), one 5 oz glass of wine (148 mL), or one 1 oz glass of hard liquor (44 mL). Keep yourself hydrated with water, diet soda, or unsweetened iced tea. Keep in mind that regular soda, juice, and other mixers may contain a lot of sugar and must be counted as carbs. What are tips for following this plan?  Reading food labels Start by checking the  serving size on the Nutrition Facts label of packaged foods and drinks. The number of calories and the amount of carbs, fats, and other nutrients listed on the label are based on one serving of the item. Many items contain more than one serving per package. Check the total grams (g) of carbs in one serving. Check the number of grams of saturated fats and trans fats in one serving. Choose foods that have a low amount or none of these fats. Check the number of milligrams (mg) of salt (sodium) in one serving. Most people should limit total sodium intake to less than 2,300 mg per day. Always check the nutrition information of foods labeled as "low-fat" or "nonfat." These foods may be higher in added sugar or refined carbs and should be avoided. Talk to your dietitian to identify your daily goals for nutrients listed on the label. Shopping Avoid buying canned, pre-made, or processed foods. These foods tend to be high in fat, sodium, and added sugar. Shop around the outside edge of the grocery store. This is where you will most often find fresh fruits and vegetables, bulk grains, fresh meats, and fresh dairy products. Cooking Use low-heat cooking methods, such as baking, instead of high-heat cooking methods, such as deep frying. Cook using healthy oils, such as olive, canola, or sunflower oil. Avoid cooking with butter, cream, or high-fat meats. Meal planning Eat meals and snacks regularly, preferably at the same times every day. Avoid going long periods of time without eating. Eat foods that are high in fiber, such as fresh fruits, vegetables, beans, and whole grains. Eat 4-6 oz (112-168 g) of lean protein each day, such as lean meat, chicken, fish, eggs, or tofu. One ounce (oz) (28 g) of lean protein is equal to: 1 oz (28 g) of meat, chicken, or fish. 1 egg.  cup (62 g) of tofu. Eat some foods each day that contain healthy fats, such as avocado, nuts, seeds, and fish. What foods should I  eat? Fruits Berries. Apples. Oranges. Peaches. Apricots. Plums. Grapes. Mangoes. Papayas. Pomegranates. Kiwi. Cherries. Vegetables Leafy greens, including lettuce, spinach, kale, chard, collard greens, mustard greens, and cabbage. Beets. Cauliflower. Broccoli. Carrots. Glee [place] beans. Tomatoes. Peppers. Onions. Cucumbers. Brussels sprouts. Grains Whole grains, such as whole-wheat or whole-grain bread, crackers, tortillas, cereal, and pasta. Unsweetened oatmeal. Quinoa. Brown or wild rice. Meats and other proteins Seafood. Poultry without skin. Lean cuts of poultry and beef. Tofu. Nuts. Seeds. Dairy Low-fat or fat-free dairy products such as milk, yogurt, and cheese. The items listed above may not be a complete list of foods and beverages you can eat and drink. Contact a dietitian for more information. What foods should I avoid? Fruits Fruits canned with syrup. Vegetables Canned vegetables. Frozen vegetables with butter or cream sauce. Grains Refined white flour and flour products such as bread, pasta, snack foods, and cereals. Avoid all processed foods. Meats and other proteins Fatty cuts of meat. Poultry with skin. Breaded or fried meats. Processed meat. Avoid saturated fats. Dairy Full-fat yogurt, cheese, or milk. Beverages Sweetened drinks, such as soda or iced tea. The  items listed above may not be a complete list of foods and beverages you should avoid. Contact a dietitian for more information. Questions to ask a health care provider Do I need to meet with a certified diabetes care and education specialist? Do I need to meet with a dietitian? What number can I call if I have questions? When are the best times to check my blood glucose? Where to find more information: American Diabetes Association: diabetes.org Academy of Nutrition and Dietetics: eatright.[employer] of Diabetes and Digestive and Kidney Diseases: [employer]is Association of Diabetes Care & Education  Specialists: diabeteseducator.org Summary It is important to have healthy eating habits because your blood sugar (glucose) levels are greatly affected by what you eat and drink. It is important to use alcohol carefully. A healthy meal plan will help you manage your blood glucose and lower your risk of heart disease. Your health care provider may recommend that you work with a dietitian to make a meal plan that is best for you. This information is not intended to replace advice given to you by your health care provider. Make sure you discuss any questions you have with your health care provider. Document Revised: 05/29/2020 Document Reviewed: 05/29/2020 Elsevier Patient Education  [place].

## 2022-08-29 ENCOUNTER — Other Ambulatory Visit: Payer: Self-pay | Admitting: Family Medicine

## 2022-09-06 ENCOUNTER — Other Ambulatory Visit: Payer: Self-pay | Admitting: Family Medicine

## 2022-09-10 NOTE — Telephone Encounter (Signed)
Patient has not signed Ozempic forms yet. Amy - please remind patient to come sign forms and bring proof of income so we can try to get Ozempic free

## 2022-09-10 NOTE — Telephone Encounter (Signed)
Spoke with patient. He is going to come in to sign and bring his income information with him.  Charlene Brooke, CPP notified  Marijean Niemann, [place] Clinical Pharmacy Assistant [phone]

## 2022-10-06 ENCOUNTER — Ambulatory Visit: Payer: Self-pay

## 2022-10-06 ENCOUNTER — Encounter: Payer: Self-pay | Admitting: Gastroenterology

## 2022-10-06 NOTE — Patient Outreach (Signed)
  Care Coordination   Follow Up Visit Note   10/06/2022 Name: Tim Mccullough MRN: [id number] DOB: Oct 20, 1951  Tim Mccullough is a 71 y.o. year old male who sees Tonia Ghent, MD for primary care. I spoke with  [name] by phone today.  What matters to the patients health and wellness today?  Patient states he is doing well.   Reports his blood sugars range from 100-150.  Denies blood sugars <70.  He states he has loss between 10-15 since taking Ozempic.      Goals Addressed               This Visit's Progress     Manage my Diabetes (pt-stated)        Care Coordination Interventions: Reviewed medications with patient and discussed importance of medication adherence Discussed ways to increase activity:  take stairs instead of elevator, park farther away from store entrances, doing yard work.  Advised to check blood sugars daily and recheck blood sugar if experiencing symptoms Reviewed treatment for hypo/hyperglycemia Discussed diabetic diet/ diabetic plate method Mailed patient exercise booklet Reviewed scheduled/ upcoming provider appointments             SDOH assessments and interventions completed:  No     Care Coordination Interventions:  Yes, provided   Follow up plan: Follow up call scheduled for 12/02/21    Encounter Outcome:  Pt. Visit Completed

## 2022-10-26 ENCOUNTER — Encounter: Payer: Self-pay | Admitting: Gastroenterology

## 2022-11-04 ENCOUNTER — Telehealth: Payer: Self-pay | Admitting: *Deleted

## 2022-11-04 ENCOUNTER — Encounter: Payer: Self-pay | Admitting: Gastroenterology

## 2022-11-04 ENCOUNTER — Ambulatory Visit (AMBULATORY_SURGERY_CENTER): Payer: Medicare HMO | Admitting: *Deleted

## 2022-11-04 VITALS — Ht 70.0 in | Wt 203.0 lb

## 2022-11-04 DIAGNOSIS — K2270 Barrett's esophagus without dysplasia: Secondary | ICD-10-CM

## 2022-11-04 NOTE — Progress Notes (Signed)
No egg or soy allergy known to patient  No issues known to pt with past sedation with any surgeries or procedures Patient denies ever being told they had issues or difficulty with intubation  No FH of Malignant Hyperthermia Pt is not on diet pills Pt is not on  home 02  Pt is not on blood thinners  Pt denies issues with constipation  Pt is not on dialysis Pt denies any upcoming cardiac testing Pt encouraged to use to use Singlecare or Goodrx to reduce cost  Patient's chart reviewed by Osvaldo Angst CNRA prior to previsit and patient appropriate for the [place].  Previsit completed and red dot placed by patient's name on their procedure day (on provider's schedule).  . Pre-visit done via phone Instructions sent by mail and my chart

## 2022-11-05 ENCOUNTER — Ambulatory Visit: Payer: Medicare HMO | Admitting: Family Medicine

## 2022-11-10 ENCOUNTER — Telehealth: Payer: Self-pay | Admitting: Gastroenterology

## 2022-11-10 NOTE — Telephone Encounter (Signed)
Patient has a procedure tomorrow at 10:00 am with Dr. Fuller Plan. He called the office, stating he never got his prep instructions and is not certain of which medications he can and cannot take the morning of his procedure. I tried to direct the patient in his MyChart however, patient does not have access. Patient would like nurse to go over his instructions and medications again. Thank you.

## 2022-11-11 ENCOUNTER — Encounter: Payer: Self-pay | Admitting: Gastroenterology

## 2022-11-11 ENCOUNTER — Ambulatory Visit (AMBULATORY_SURGERY_CENTER): Payer: Medicare HMO | Admitting: Gastroenterology

## 2022-11-11 VITALS — BP 109/65 | HR 91 | Temp 96.8°F | Resp 19 | Ht 70.0 in | Wt 203.0 lb

## 2022-11-11 DIAGNOSIS — K219 Gastro-esophageal reflux disease without esophagitis: Secondary | ICD-10-CM | POA: Diagnosis not present

## 2022-11-11 DIAGNOSIS — K449 Diaphragmatic hernia without obstruction or gangrene: Secondary | ICD-10-CM

## 2022-11-11 DIAGNOSIS — K31819 Angiodysplasia of stomach and duodenum without bleeding: Secondary | ICD-10-CM

## 2022-11-11 DIAGNOSIS — K2270 Barrett's esophagus without dysplasia: Secondary | ICD-10-CM | POA: Diagnosis not present

## 2022-11-11 DIAGNOSIS — E119 Type 2 diabetes mellitus without complications: Secondary | ICD-10-CM | POA: Diagnosis not present

## 2022-11-11 DIAGNOSIS — I10 Essential (primary) hypertension: Secondary | ICD-10-CM | POA: Diagnosis not present

## 2022-11-11 MED ORDER — SODIUM CHLORIDE 0.9 % IV SOLN: 500.0000 mL | Freq: Once | INTRAVENOUS | Status: DC

## 2022-11-11 NOTE — Progress Notes (Signed)
Pt's states no medical or surgical changes since previsit or office visit.  930 BP rechecked 112/69

## 2022-11-11 NOTE — Patient Instructions (Signed)
YOU HAD AN ENDOSCOPIC PROCEDURE TODAY AT THE  ENDOSCOPY CENTER:   Refer to the procedure report that was given to you for any specific questions about what was found during the examination.  If the procedure report does not answer your questions, please call your gastroenterologist to clarify.  If you requested that your care partner not be given the details of your procedure findings, then the procedure report has been included in a sealed envelope for you to review at your convenience later.  YOU SHOULD EXPECT: Some feelings of bloating in the abdomen. Passage of more gas than usual.  Walking can help get rid of the air that was put into your GI tract during the procedure and reduce the bloating. If you had a lower endoscopy (such as a colonoscopy or flexible sigmoidoscopy) you may notice spotting of blood in your stool or on the toilet paper. If you underwent a bowel prep for your procedure, you may not have a normal bowel movement for a few days.  Please Note:  You might notice some irritation and congestion in your nose or some drainage.  This is from the oxygen used during your procedure.  There is no need for concern and it should clear up in a day or so.  SYMPTOMS TO REPORT IMMEDIATELY:   Following upper endoscopy (EGD)  Vomiting of blood or coffee ground material  New chest pain or pain under the shoulder blades  Painful or persistently difficult swallowing  New shortness of breath  Fever of 100F or higher  Black, tarry-looking stools  For urgent or emergent issues, a gastroenterologist can be reached at any hour by calling [phone]. Do not use MyChart messaging for urgent concerns.    DIET:  We do recommend a small meal at first, but then you may proceed to your regular diet.  Drink plenty of fluids but you should avoid alcoholic beverages for 24 hours.  ACTIVITY:  You should plan to take it easy for the rest of today and you should NOT DRIVE or use heavy machinery  until tomorrow (because of the sedation medicines used during the test).    FOLLOW UP: Our staff will call the number listed on your records the next business day following your procedure.  We will call around 7:15- 8:00 am to check on you and address any questions or concerns that you may have regarding the information given to you following your procedure. If we do not reach you, we will leave a message.     If any biopsies were taken you will be contacted by phone or by letter within the next 1-3 weeks.  Please call us at [phone] if you have not heard about the biopsies in 3 weeks.    SIGNATURES/CONFIDENTIALITY: You and/or your care partner have signed paperwork which will be entered into your electronic medical record.  These signatures attest to the fact that that the information above on your After Visit Summary has been reviewed and is understood.  Full responsibility of the confidentiality of this discharge information lies with you and/or your care-partner. 

## 2022-11-11 NOTE — Progress Notes (Signed)
History & Physical  Primary Care Physician:  Tonia Ghent, MD Primary Gastroenterologist: Lucio Edward, MD  CHIEF COMPLAINT: Barrett's esophagus  HPI: Tim Mccullough is a 72 y.o. male with long segment Barrett's esophagus without dysplasia for surveillance EGD.   Past Medical History:  Diagnosis Date   Abnormal echocardiogram 12/2009   Repeated after med mgmt of cardiomyopathy showed EF 40-45%, mildly dilated LV, mild LV hypertrophy, anterolateral and apical hypokinesis, mild diastolic dysfunction.   Anemia    hx   Anxiety    Arthritis    Blood transfusion without reported diagnosis 09/2012   BPH (benign prostatic hypertrophy) 2004   Cancer [facility])    melanoma back   Cardiomyopathy    ? tachycardia-mediated   Cataract    Depression    Diabetes mellitus 11/2006   Type II   Diverticulosis    Echocardiogram abnormal 05/2009   Mild LVH with a mildly dilated LV.  EF was difficult to assess given poor acoustic windows but appeared moderately decreased.  Mild MR.  Probable small perimembranous VSD, mild LAE   Esophageal erosions    GERD (gastroesophageal reflux disease)    H/O cardiac radiofrequency ablation    Hip pain    History of MRI 05/2009   Cardiac MRI was done to followup difficult echo.  This showed mild to moderately dilated LV, mild to moderate LAE, EF  33% with global hypokinesis, normal RV size with mild systolic dysfunction.  There was no large VSD evident.  There was no myocardial delayed enhancement so no evidence for infiltrative disease or infarction.  LHC (7/10) showed only luminal irreg. in the coronaries and EF 35%   Holter monitor, abnormal 05/2009   Frequent PAC's and PVC's  Average HR 96  (but he had started Toprol XL).  HR range 71-130   Hyperlipidemia 04/1998   Hypertension 1995   Obesity    OSA (obstructive sleep apnea)    Not using CPAP- improved with weight loss   Pneumonia    HX OF pna   Sinus tachycardia    Pt says he has been told his  heart rate is high since he was in high school.    Sleep apnea    has cpap; but does not use   VSD (ventricular septal defect)    Probable small perimembranous    Past Surgical History:  Procedure Laterality Date   CARDIAC CATHETERIZATION  05/30/2009   Dilated cardiomyopathy.  min VSD.  Hypokin Inf  wall.  Multiple min luminal Irr.  EF 35%   CATARACT EXTRACTION, BILATERAL     COLONOSCOPY     COLONOSCOPY W/ BIOPSIES  08/24/2006   Divertics, polyps x3, bx negative, repeat 2012 with mild diverticulosis in teh sigmoid colon and 1 polyp in tranverse colon, repeat due 2017   ESOPHAGOGASTRODUODENOSCOPY  2012   1)Barrett's esoph, 2) Erosive esophagitis, 3) Mild gastritis in the antrum, 4) Duodenitis in the bulb of duodenum, 5) Small hiatal hernia   INGUINAL HERNIA REPAIR  1985   Left   INGUINAL HERNIA REPAIR  1999   Right   KNEE ARTHROSCOPY  10/15/2011   Procedure: ARTHROSCOPY KNEE;  Surgeon: Ninetta Lights, MD;  Location: [place];  Service: Orthopedics;  Laterality: Right;  right knee scope with lateral meniscectomy, removal loose foreign body, and microfracture technique   LAMINECTOMY  1987   with discectomy   PVC ABLATION N/A 03/18/2020   Procedure: PVC ABLATION;  Surgeon: Evans Lance, MD;  Location: [place] [facility];  Service: Cardiovascular;  Laterality: N/A;   REVERSE SHOULDER ARTHROPLASTY Left 11/19/2021   Procedure: REVERSE SHOULDER ARTHROPLASTY;  Surgeon: Hiram Gash, MD;  Location: [facility];  Service: Orthopedics;  Laterality: Left;   RIGHT/LEFT HEART CATH AND CORONARY ANGIOGRAPHY N/A 12/25/2019   Procedure: RIGHT/LEFT HEART CATH AND CORONARY ANGIOGRAPHY;  Surgeon: Wellington Hampshire, MD;  Location: [place] [facility];  Service: Cardiovascular;  Laterality: N/A;   ROTATOR CUFF REPAIR  12/2009   rt   TOTAL HIP ARTHROPLASTY  2011   Left   TOTAL KNEE ARTHROPLASTY  06/01/2012   Procedure: TOTAL KNEE ARTHROPLASTY;  Surgeon: Ninetta Lights, MD;  Location: [place];  Service: Orthopedics;  Laterality: Right;   UPPER GASTROINTESTINAL ENDOSCOPY      Prior to Admission medications   Medication Sig Start Date End Date Taking? Authorizing Provider  aspirin EC 81 MG tablet Take 81 mg by mouth daily. Swallow whole.   Yes [provider]  atorvastatin (LIPITOR) 40 MG tablet TAKE 1 TABLET BY MOUTH EVERY DAY 08/31/22  Yes Tonia Ghent, MD  carvedilol (COREG) 12.5 MG tablet TAKE 1 TABLET BY MOUTH TWICE A DAY 05/25/22  Yes Deboraha Sprang, MD  doxazosin (CARDURA) 1 MG tablet TAKE 1 TABLET BY MOUTH EVERY DAY 08/26/22  Yes Tonia Ghent, MD  esomeprazole (NEXIUM) 20 MG capsule Take 20 mg by mouth daily at 12 noon.   Yes [provider]  fish oil-omega-3 fatty acids 1000 MG capsule Take 1 g by mouth 2 (two) times daily.    Yes [provider]  losartan (COZAAR) 25 MG tablet TAKE 1 TABLET (25 MG) BY MOUTH ONCE DAILY AT BEDTIME 05/18/22  Yes Deboraha Sprang, MD  metFORMIN (GLUCOPHAGE) 500 MG tablet Take 2 tablets (1,000 mg total) by mouth 2 (two) times daily with a meal. 04/26/22  Yes Tonia Ghent, MD  omeprazole (PRILOSEC) 20 MG capsule Take 1 capsule (20 mg total) by mouth daily. 09/28/19  Yes Ladene Artist, MD  [facility] ULTRA test strip USE DAILY TO CHECK SUGAR. E11.9. ONE TOUCH TEST STRIPS. 05/18/22  Yes Tonia Ghent, MD  PARoxetine (PAXIL) 20 MG tablet TAKE 1 TABLET BY MOUTH EVERY DAY 04/17/22  Yes Tonia Ghent, MD  spironolactone (ALDACTONE) 25 MG tablet TAKE 1/2 TABLET BY MOUTH EVERY DAY 05/18/22  Yes Deboraha Sprang, MD  dapagliflozin propanediol (FARXIGA) 5 MG TABS tablet Take 1 tablet (5 mg total) by mouth daily before breakfast. Patient not taking: Reported on 11/04/2022 08/05/22   Tonia Ghent, MD  meloxicam (MOBIC) 15 MG tablet Take 1 tablet (15 mg total) by mouth daily. Patient not taking: Reported on 11/04/2022 11/19/21   McBane, Maylene Roes, PA-C  OZEMPIC, 0.25 OR 0.5 MG/DOSE, 2 MG/3ML SOPN INJECT 0.25 MG INTO  THE SKIN ONCE A WEEK. FOR 4 WEEKS 09/07/22   Tonia Ghent, MD    Current Outpatient Medications  Medication Sig Dispense Refill   aspirin EC 81 MG tablet Take 81 mg by mouth daily. Swallow whole.     atorvastatin (LIPITOR) 40 MG tablet TAKE 1 TABLET BY MOUTH EVERY DAY 90 tablet 1   carvedilol (COREG) 12.5 MG tablet TAKE 1 TABLET BY MOUTH TWICE A DAY 180 tablet 3   doxazosin (CARDURA) 1 MG tablet TAKE 1 TABLET BY MOUTH EVERY DAY 90 tablet 1   esomeprazole (NEXIUM) 20 MG capsule Take 20 mg by mouth daily at 12 noon.  fish oil-omega-3 fatty acids 1000 MG capsule Take 1 g by mouth 2 (two) times daily.      losartan (COZAAR) 25 MG tablet TAKE 1 TABLET (25 MG) BY MOUTH ONCE DAILY AT BEDTIME 90 tablet 3   metFORMIN (GLUCOPHAGE) 500 MG tablet Take 2 tablets (1,000 mg total) by mouth 2 (two) times daily with a meal. 360 tablet 3   omeprazole (PRILOSEC) 20 MG capsule Take 1 capsule (20 mg total) by mouth daily. 30 capsule 11   ONETOUCH ULTRA test strip USE DAILY TO CHECK SUGAR. E11.9. ONE TOUCH TEST STRIPS. 100 strip 12   PARoxetine (PAXIL) 20 MG tablet TAKE 1 TABLET BY MOUTH EVERY DAY 90 tablet 3   spironolactone (ALDACTONE) 25 MG tablet TAKE 1/2 TABLET BY MOUTH EVERY DAY 45 tablet 6   dapagliflozin propanediol (FARXIGA) 5 MG TABS tablet Take 1 tablet (5 mg total) by mouth daily before breakfast. (Patient not taking: Reported on 11/04/2022)     meloxicam (MOBIC) 15 MG tablet Take 1 tablet (15 mg total) by mouth daily. (Patient not taking: Reported on 11/04/2022) 30 tablet 0   OZEMPIC, 0.25 OR 0.5 MG/DOSE, 2 MG/3ML SOPN INJECT 0.25 MG INTO THE SKIN ONCE A WEEK. FOR 4 WEEKS 3 mL 3   Current Facility-Administered Medications  Medication Dose Route Frequency Provider Last Rate Last Admin   0.9 %  sodium chloride infusion  500 mL Intravenous Once Ladene Artist, MD        Allergies as of 11/11/2022   (No Known Allergies)    Family History  Problem Relation Age of Onset   Hypertension  Mother    Alzheimer's disease Mother    Heart disease Father        CABG   Stroke Father    Diabetes Father    Diabetes Sister    Hypertension Sister    Cancer Sister        uterine cancer   Diabetes Sister    Hypertension Sister    Colon polyps Brother    Diabetes Brother    Throat cancer Brother        Throat   Esophageal cancer Brother    Diabetes Maternal Grandmother        Insulin   Esophageal cancer Maternal Grandfather    Rectal cancer Cousin    Colon cancer Cousin    Throat cancer Other        Throat   Diabetes Other    Alcohol abuse Other    Drug abuse Neg Hx    Prostate cancer Neg Hx    Stomach cancer Neg Hx     Social History   Socioeconomic History   Marital status: Single    Spouse name: Not on file   Number of children: 3   Years of education: Not on file   Highest education level: Not on file  Occupational History   Occupation: Systems analyst: Astronomer   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  Tobacco Use   Smoking status: Former    Years: 20.00    Types: Cigarettes    Quit date: 11/09/1998    Years since quitting: 24.0   Smokeless tobacco: Former    Quit date: 06/03/2007   Tobacco comments:    no alcohol since nov 12  Vaping Use   Vaping Use: Never used  Substance and Sexual Activity   Alcohol use: No    Alcohol/week: 0.0 standard drinks of alcohol    Comment: sober  as 09/2011   Drug use: No   Sexual activity: Not Currently  Other Topics Concern   Not on file  Social History Narrative   Lives in [place], lives alone   3 kids (2 sons, 1 daughter)   Divorced   Social Determinants of Health   Financial Resource Strain: Low Risk  (05/21/2022)   Overall Financial Resource Strain (CARDIA)    Difficulty of Paying Living Expenses: Not hard at all  Food Insecurity: No Food Insecurity (07/20/2022)   Hunger Vital Sign    Worried About Running Out of Food in the Last Year: Never true    Ran Out of Food in the Last Year: Never  true  Transportation Needs: No Transportation Needs (07/20/2022)   PRAPARE - Hydrologist (Medical): No    Lack of Transportation (Non-Medical): No  Physical Activity: Inactive (05/21/2022)   Exercise Vital Sign    Days of Exercise per Week: 0 days    Minutes of Exercise per Session: 0 min  Stress: No Stress Concern Present (05/21/2022)   [place]    Feeling of Stress : Not at all  Social Connections: Not on file  Intimate Partner Violence: Not on file    Review of Systems:  All systems reviewed were negative except where noted in HPI.   Physical Exam: General:  Alert, well-developed, in NAD Head:  Normocephalic and atraumatic. Eyes:  Sclera clear, no icterus.   Conjunctiva pink. Ears:  Normal auditory acuity. Mouth:  No deformity or lesions.  Neck:  Supple; no masses . Lungs:  Clear throughout to auscultation.   No wheezes, crackles, or rhonchi. No acute distress. Heart:  Regular rate and rhythm; no murmurs. Abdomen:  Soft, nondistended, nontender. No masses, hepatomegaly. No obvious masses.  Normal bowel .    Rectal:  Deferred   Msk:  Symmetrical without gross deformities.. Pulses:  Normal pulses noted. Extremities:  Without edema. Neurologic:  Alert and  oriented x4;  grossly normal neurologically. Skin:  Intact without significant lesions or rashes. Psych:  Alert and cooperative. Normal mood and affect.  Impression / Plan:   Long segment Barrett's esophagus without dysplasia for surveillance EGD.  Pricilla Riffle. Fuller Plan  11/11/2022, 9:54 AM See Shea Evans, [facility], to contact our on call provider

## 2022-11-11 NOTE — Op Note (Signed)
[facility] Patient Name: Tim Mccullough Procedure Date: 11/11/2022 9:55 AM MRN: [id number] Endoscopist: Ladene Artist , MD, [id number] Age: 72 Referring MD:  Date of Birth: 1951-07-01 Gender: Male Account #: [id number] Procedure:                Upper GI endoscopy Indications:              Surveillance for malignancy due to personal history                            of Barrett's esophagus Medicines:                Monitored Anesthesia Care Procedure:                Pre-Anesthesia Assessment:                           - Prior to the procedure, a History and Physical                            was performed, and patient medications and                            allergies were reviewed. The patient's tolerance of                            previous anesthesia was also reviewed. The risks                            and benefits of the procedure and the sedation                            options and risks were discussed with the patient.                            All questions were answered, and informed consent                            was obtained. Prior Anticoagulants: The patient has                            taken no anticoagulant or antiplatelet agents. ASA                            Grade Assessment: II - A patient with mild systemic                            disease. After reviewing the risks and benefits,                            the patient was deemed in satisfactory condition to                            undergo the procedure.  After obtaining informed consent, the endoscope was                            passed under direct vision. Throughout the                            procedure, the patient's blood pressure, pulse, and                            oxygen saturations were monitored continuously. The                            GIF [id number] #[id number] was introduced through the                            mouth, and advanced to the second  part of duodenum.                            The upper GI endoscopy was accomplished without                            difficulty. The patient tolerated the procedure                            well. Scope In: Scope Out: Findings:                 There were esophageal mucosal changes secondary to                            established long-segment Barrett's disease present                            in the distal esophagus. The maximum longitudinal                            extent of these mucosal changes was 7 cm in length.                            Mucosa was biopsied with a cold forceps for                            histology in a targeted manner at intervals of 1 cm                            in the lower third of the esophagus. A total of 2                            specimen bottles were sent to pathology.                           The exam of the esophagus was otherwise normal.  A small hiatal hernia was present.                           The exam of the stomach was otherwise normal.                           A single 2 mm angioectasia without bleeding was                            found in the duodenal bulb.                           The exam of the duodenum was otherwise normal. Complications:            No immediate complications. Estimated Blood Loss:     Estimated blood loss was minimal. Impression:               - Esophageal mucosal changes secondary to                            established long-segment Barrett's disease.                            Biopsied.                           - Small hiatal hernia.                           - Small duodenal AVM. Recommendation:           - Patient has a contact number available for                            emergencies. The signs and symptoms of potential                            delayed complications were discussed with the                            patient. Return to normal activities tomorrow.                             Written discharge instructions were provided to the                            patient.                           - Resume previous diet.                           - Continue present medications.                           - Await pathology results.                           -  Repeat upper endoscopy after studies are complete                            for surveillance based on pathology results. Ladene Artist, MD 11/11/2022 10:31:32 AM This report has been signed electronically.

## 2022-11-11 NOTE — Telephone Encounter (Signed)
Called and spoke with patient. Advised him not to take his Metformin this morning, and to take his BP medication if he wanted to. Also advised patient that he could hold his aspirin until after the procedure. Pt verbalized understanding and had no further concerns at the end of the call.

## 2022-11-11 NOTE — Progress Notes (Signed)
Report to PACU, RN, vss, BBS= Clear.  

## 2022-11-11 NOTE — Progress Notes (Signed)
Called to room to assist during endoscopic procedure.  Patient ID and intended procedure confirmed with present staff. Received instructions for my participation in the procedure from the performing physician.  

## 2022-11-12 ENCOUNTER — Encounter: Payer: Self-pay | Admitting: Family Medicine

## 2022-11-12 ENCOUNTER — Ambulatory Visit (INDEPENDENT_AMBULATORY_CARE_PROVIDER_SITE_OTHER): Payer: Medicare HMO | Admitting: Family Medicine

## 2022-11-12 ENCOUNTER — Telehealth: Payer: Self-pay

## 2022-11-12 VITALS — BP 104/68 | HR 102 | Temp 97.3°F | Ht 70.0 in | Wt 201.0 lb

## 2022-11-12 DIAGNOSIS — E119 Type 2 diabetes mellitus without complications: Secondary | ICD-10-CM

## 2022-11-12 DIAGNOSIS — R000 Tachycardia, unspecified: Secondary | ICD-10-CM | POA: Diagnosis not present

## 2022-11-12 DIAGNOSIS — I471 Supraventricular tachycardia, unspecified: Secondary | ICD-10-CM | POA: Diagnosis not present

## 2022-11-12 DIAGNOSIS — M7660 Achilles tendinitis, unspecified leg: Secondary | ICD-10-CM | POA: Diagnosis not present

## 2022-11-12 LAB — POCT GLYCOSYLATED HEMOGLOBIN (HGB A1C): Hemoglobin A1C: 7.3 % — AB (ref 4.0–5.6)

## 2022-11-12 MED ORDER — LOSARTAN POTASSIUM 25 MG PO TABS: 12.5000 mg | ORAL_TABLET | Freq: Every day | ORAL | Status: DC

## 2022-11-12 NOTE — Telephone Encounter (Signed)
  Follow up Call-     11/11/2022    9:13 AM  Call back number  Post procedure Call Back phone  # [phone]  Permission to leave phone message Yes     Patient questions:  Do you have a fever, pain , or abdominal swelling? No. Pain Score  0 *  Have you tolerated food without any problems? Yes.    Have you been able to return to your normal activities? Yes.    Do you have any questions about your discharge instructions: Diet   No. Medications  No. Follow up visit  No.  Do you have questions or concerns about your Care? No.  Actions: * If pain score is 4 or above: No action needed, pain <4.

## 2022-11-12 NOTE — Progress Notes (Signed)
Diabetes:  Using medications without difficulties: yes Hypoglycemic episodes:no Hyperglycemic episodes:no Feet problems: see below Blood Sugars averaging: usually ~150.   eye exam within last year: due, d/w pt.  A1c d/w pt at OV. 7.3, improved from prior.   Weight down from 218 to 201 today.   Wilder Glade, metformin 2 tabs BID, ozempic 0.'25mg'$  per week.  No ADE on med. No abd pain.  Dec in appetite noted.    Intermittent soreness at R heel.  See exam.  No trauma, not draining.    Tachycardia noted.  Not lightheaded. No CP.  He had noted tachycardia at home, recently.    Meds, vitals, and allergies reviewed.   ROS: Per HPI unless specifically indicated in ROS section   GEN: nad, alert and oriented HEENT: ncat NECK: supple w/o LA CV: rrr. PULM: ctab, no inc wob ABD: soft, +bs EXT: no edema SKIN: no acute rash  Puffy at R achilles tendon, just proximal to the insertion, w/o ROM deficit.

## 2022-11-12 NOTE — Patient Instructions (Addendum)
I'll update cardiology.  Let me know if your heart rate doesn't improve on the lower dose of losartan.  Recheck in about 3 months.  Try a heel lift and ice your ankle for about 5 minutes at a time.   Likely achilles irritation.   If not better, then ask about seeing Dr. Lorelei Pont.   Take care.  Glad to see you.

## 2022-11-14 ENCOUNTER — Telehealth: Payer: Self-pay | Admitting: Family Medicine

## 2022-11-14 DIAGNOSIS — M7660 Achilles tendinitis, unspecified leg: Secondary | ICD-10-CM | POA: Insufficient documentation

## 2022-11-14 DIAGNOSIS — I471 Supraventricular tachycardia, unspecified: Secondary | ICD-10-CM | POA: Insufficient documentation

## 2022-11-14 NOTE — Telephone Encounter (Signed)
I need your input here.  He had noticed mild tachycardia at home.  He was regular on exam at his most recent office visit.  I do not know if his tachycardia was related to or exacerbated by relatively low blood pressure.  I asked him to decrease his losartan to 12.5 mg a day.  I did not change his medications otherwise.  I asked him to update me if his symptoms persisted and I would appreciate your input.  Thanks.

## 2022-11-14 NOTE — Assessment & Plan Note (Signed)
Discussed anatomy. Reasonable to try a heel lift and ice his ankle for about 5 minutes at a time.   Likely achilles irritation.   If not better, then ask about seeing Dr. Lorelei Pont.

## 2022-11-14 NOTE — Assessment & Plan Note (Signed)
Noted at home by patient.  Unclear if this is exacerbated by relatively lower blood pressure.  Discussed decreasing losartan to 12.5 mg/day.  He will update me if tachycardia persist.  I will update cardiology in the meantime.  See following phone note.

## 2022-11-14 NOTE — Assessment & Plan Note (Signed)
A1c d/w pt at OV. 7.3, improved from prior.   Weight down from 218 to 201 today.   Wilder Glade, metformin 2 tabs BID, ozempic 0.'25mg'$  per week.  No ADE on med. No abd pain.  Dec in appetite noted.   Recheck in 3 months.  No change medication at this point.

## 2022-11-19 NOTE — Telephone Encounter (Signed)
Attempted to call.

## 2022-11-22 NOTE — Telephone Encounter (Signed)
Please call cardiology office and see about follow-up regarding the message below.  Thanks.

## 2022-11-26 ENCOUNTER — Encounter: Payer: Self-pay | Admitting: Gastroenterology

## 2022-11-26 ENCOUNTER — Telehealth: Payer: Self-pay | Admitting: Internal Medicine

## 2022-11-26 NOTE — Telephone Encounter (Signed)
Tim Mccullough  if his BP is low-- I thought this was speculative but if measured low I would certainly decrease his losartan Thanks SK

## 2022-11-26 NOTE — Telephone Encounter (Signed)
Pt's PCP would like a callback regarding pt's low BP readings and mild Tachycardia episodes. He would like input on whether may be due to dosage change on Losartan medication. Please advise

## 2022-11-26 NOTE — Telephone Encounter (Signed)
Attempted to call the office back. Was left on hold. Message routed to the provider and nurse. Please see other phone note.

## 2022-11-26 NOTE — Telephone Encounter (Addendum)
Called cardiology and they are sending a message back to Dr. Aquilla Hacker nurse for his input requested on the message below. She stated they would give [place] a call back

## 2022-11-26 NOTE — Telephone Encounter (Signed)
Tim Mccullough   I am for another week recovering from decompression laminectomy  I am not sure what it is, is there any way you can get a 12 lead ECG  That would be agreat start,  I will be on line doing telehealth from my resting position :)  Thanks SK

## 2022-11-27 NOTE — Telephone Encounter (Signed)
Called and spoke to pt. Scheduled his appointment for 1/26 at 12 pm.

## 2022-11-27 NOTE — Telephone Encounter (Signed)
Noted. Thanks.

## 2022-11-27 NOTE — Telephone Encounter (Signed)
Reviewed the patient's chart.  Dr. Caryl Comes has responded back to Dr. Damita Dunnings in an additional encounter dated 11/14/22.  He has requested the patient's PCP an EKG.   Closing this encounter.

## 2022-11-27 NOTE — Telephone Encounter (Signed)
I didn't know that Dr. Caryl Comes was out.  I hope he feels better soon.  Please see about getting in touch with patient.  If still having tachycardia, then needs OV here for EKG.  Thanks.

## 2022-11-30 ENCOUNTER — Other Ambulatory Visit: Payer: Self-pay | Admitting: Family Medicine

## 2022-12-02 ENCOUNTER — Ambulatory Visit: Payer: Self-pay

## 2022-12-02 NOTE — Patient Outreach (Signed)
  Care Coordination   Follow Up Visit Note   12/02/2022 Name: Tim Mccullough MRN: [id number] DOB: [birth year]  Tim Mccullough is a 72 y.o. year old male who sees Tonia Ghent, MD for primary care. I spoke with  [name] by phone today.  What matters to the patients health and wellness today?  "Elevated heart rate"    Goals Addressed               This Visit's Progress     Manage my Diabetes and heart failure (pt-stated)        Care Coordination Interventions: Evaluation of current treatment plan related to diabetes / heart failure and patient's adherence to plan as established by provide:  Patient states he is doing well regarding his diabetes. He reports blood sugar range is 100-140 and states his weight has decreased to 198 lbs from 218 lbs.  Patient states he has recently started having an increase in his heart rate.  He states his most recent heart rate at his provider visit was 102.  Patient states his provider decreased his losartan to determine if heart rate would decrease.  He states this did not make any change in heart rate therefore he is scheduled to follow up with is primary provider again on 12/04/22 Discussed recent Hgb A1c Lab result of 7.3 and goal achievement.  Discussed plans with patient for ongoing care management follow up:  Patient verbally agreed to next telephone follow up call with RNCM on 01/04/23.  Reviewed medications with patient and discussed importance of medication adherence.  Advised to notify provider for a 3 lb weight gain overnight or 5 lb weight gain in a week also advised to notify for ongoing elevated pulse rates above 100:  Patient states his provider decreased is losartan to 1/2 tablet due to increased heart rate.  Patient advised to continue to monitor blood sugars daily  Advised to weight and heart rate daily and record.  Reviewed scheduled/ upcoming provider appointments             SDOH assessments and interventions  completed:  No     Care Coordination Interventions:  Yes, provided   Follow up plan: Follow up call scheduled for 01/04/23    Encounter Outcome:  Pt. Visit Completed   Quinn Plowman RN,BSN,CCM [place] [phone] direct line

## 2022-12-04 ENCOUNTER — Ambulatory Visit (INDEPENDENT_AMBULATORY_CARE_PROVIDER_SITE_OTHER): Payer: Medicare HMO | Admitting: Family Medicine

## 2022-12-04 VITALS — BP 102/70 | HR 78 | Temp 97.6°F | Ht 70.0 in | Wt 203.0 lb

## 2022-12-04 DIAGNOSIS — R000 Tachycardia, unspecified: Secondary | ICD-10-CM

## 2022-12-04 NOTE — Patient Instructions (Signed)
Don't change your meds for now but let me check with cardiology.  We may need to adjust your carvedilol but I want cardiology input first.  Take care.  Glad to see you.

## 2022-12-04 NOTE — Progress Notes (Unsigned)
Tachycardia f/u.  Pulse 78 today on intake, 95 on EKG and 100-103 resting at the visit.  He cut losartan down to 12.'5mg'$  since the last OV.  No significant change in the meantime with that med dose adjustment.  He isn't lightheaded usually unless sudden position change.  This usually isn't an issue.  Still have elevated pulse at home.  ~100 pulse.    He has been icing his achilles.  Limp is better. Doesn't have a heel lift yet, d/w pt.    Meds, vitals, and allergies reviewed.   ROS: Per HPI unless specifically indicated in ROS section   GEN: nad, alert and oriented HEENT: ncat NECK: supple w/o LA CV: RRR PULM: ctab, no inc wob ABD: soft, +bs EXT: no edema SKIN: Well-perfused.

## 2022-12-06 ENCOUNTER — Telehealth: Payer: Self-pay | Admitting: Family Medicine

## 2022-12-06 NOTE — Telephone Encounter (Signed)
I need your input about potentially increasing his carvedilol and decreasing his other antihypertensive medications given his baseline tachycardia/blood pressure.  I suspect that decreasing his doxazosin will increase his urinary symptoms so I did not change that.  Please see my office note and his most recent EKG.  Thanks.

## 2022-12-06 NOTE — Assessment & Plan Note (Signed)
Discussed with patient about potentially increasing his beta-blocker but my concern is about relative hypotension that may result.  My question for cardiology is about tapering his other antihypertensives.  I suspect that he would have increased BPH symptoms if he took them off doxazosin.  I am asking for cardiology input.  No change in medications at this point.

## 2022-12-09 NOTE — Telephone Encounter (Signed)
Please see if anyone from cardiology can address this if Dr. Caryl Comes is not available.  Thanks.

## 2022-12-11 DIAGNOSIS — F419 Anxiety disorder, unspecified: Secondary | ICD-10-CM | POA: Diagnosis not present

## 2022-12-11 DIAGNOSIS — M1990 Unspecified osteoarthritis, unspecified site: Secondary | ICD-10-CM | POA: Diagnosis not present

## 2022-12-11 DIAGNOSIS — K219 Gastro-esophageal reflux disease without esophagitis: Secondary | ICD-10-CM | POA: Diagnosis not present

## 2022-12-11 DIAGNOSIS — I4891 Unspecified atrial fibrillation: Secondary | ICD-10-CM | POA: Diagnosis not present

## 2022-12-11 DIAGNOSIS — K08109 Complete loss of teeth, unspecified cause, unspecified class: Secondary | ICD-10-CM | POA: Diagnosis not present

## 2022-12-11 DIAGNOSIS — I2510 Atherosclerotic heart disease of native coronary artery without angina pectoris: Secondary | ICD-10-CM | POA: Diagnosis not present

## 2022-12-11 DIAGNOSIS — N189 Chronic kidney disease, unspecified: Secondary | ICD-10-CM | POA: Diagnosis not present

## 2022-12-11 DIAGNOSIS — R32 Unspecified urinary incontinence: Secondary | ICD-10-CM | POA: Diagnosis not present

## 2022-12-11 DIAGNOSIS — G4733 Obstructive sleep apnea (adult) (pediatric): Secondary | ICD-10-CM | POA: Diagnosis not present

## 2022-12-11 DIAGNOSIS — E1142 Type 2 diabetes mellitus with diabetic polyneuropathy: Secondary | ICD-10-CM | POA: Diagnosis not present

## 2022-12-11 DIAGNOSIS — E785 Hyperlipidemia, unspecified: Secondary | ICD-10-CM | POA: Diagnosis not present

## 2022-12-11 DIAGNOSIS — Z008 Encounter for other general examination: Secondary | ICD-10-CM | POA: Diagnosis not present

## 2022-12-11 DIAGNOSIS — E1151 Type 2 diabetes mellitus with diabetic peripheral angiopathy without gangrene: Secondary | ICD-10-CM | POA: Diagnosis not present

## 2022-12-11 DIAGNOSIS — R69 Illness, unspecified: Secondary | ICD-10-CM | POA: Diagnosis not present

## 2022-12-11 NOTE — Telephone Encounter (Signed)
Discussed with Dr. Caryl Comes by phone- he advised that he would reach out to Dr. Damita Dunnings by phone regarding the patient.

## 2022-12-13 NOTE — Telephone Encounter (Signed)
Appreciated put from Dr. Caryl Comes.  Please update patient.  If he is tolerating his current heart rate, around 90-100 bpm, then the best option may be not to change his medications.  I am worried that if we increase his carvedilol we could induce an inappropriately low blood pressure and I am worried that stopping spironolactone or losartan would be counterproductive.  All in all, it may make sense to continue as is.  If he has significant heart rate elevations in the meantime or if he is feeling worse then please let me know/let cardiology know.  Thanks.

## 2022-12-14 NOTE — Telephone Encounter (Signed)
Called patient and reviewed all information. Patient verbalized understanding. Will call if any further questions.  

## 2023-01-04 ENCOUNTER — Ambulatory Visit: Payer: Self-pay

## 2023-01-04 NOTE — Patient Outreach (Signed)
  Care Coordination   Follow Up Visit Note   01/04/2023 Name: Tim Mccullough MRN: [id number] DOB: 1951-01-29  Tim Mccullough is a 72 y.o. year old male who sees Tim Ghent, MD for primary care. I spoke with  Tim Mccullough by phone today.  What matters to the patients health and wellness today?  Patient reports having follow up visit with primary care provider in January 2024 regarding increase heart rate.  Patient reports pulse rate ranges from 90's to under 110.  Patient states he notices if he exerts himself pulse rate elevates.  He states his primary care provider advised him to continue taking his current medications as prescribed.  Patient states he continues to do well with managing his diabetes. Reports blood sugars range from 100-140.  Patient states his blood pressure on average is around 115/70.  Patient reports occasional dizziness when standing up to fast.     Goals Addressed               This Visit's Progress     Manage my Diabetes and heart failure (pt-stated)        Interventions Today    Flowsheet Row Most Recent Value  Chronic Disease   Chronic disease during today's visit Diabetes, Other, Congestive Heart Failure (CHF)  [tachycardia]  General Interventions   General Interventions Discussed/Reviewed General Interventions Reviewed, Doctor Visits  Doctor Visits Discussed/Reviewed Doctor Visits Reviewed  [Advised to monitor pulse rate and blood pressure daily.  Advised to stand slowly from sitting or laying position. Recommend patient limit caffiene. Continue to monitor blood sugars daily. Reviewed upcoming/ scheduled appointments.]  Pharmacy Interventions   Pharmacy Dicussed/Reviewed Pharmacy Topics Reviewed  [Medications reviewed and compliance discussed.]                  SDOH assessments and interventions completed:  No     Care Coordination Interventions:  Yes, provided   Follow up plan: Follow up call scheduled for 02/18/23    Encounter  Outcome:  Pt. Visit Completed   Tim Plowman RN,BSN,CCM Tim Mccullough [phone] direct line

## 2023-01-16 ENCOUNTER — Other Ambulatory Visit: Payer: Self-pay | Admitting: Family Medicine

## 2023-02-01 ENCOUNTER — Other Ambulatory Visit: Payer: Self-pay | Admitting: Family Medicine

## 2023-02-11 ENCOUNTER — Ambulatory Visit: Payer: Medicare HMO | Admitting: Family Medicine

## 2023-02-17 ENCOUNTER — Telehealth: Payer: Self-pay

## 2023-02-17 NOTE — Patient Outreach (Signed)
  Care Coordination   Follow Up Visit Note   02/17/2023 Name: Tim Mccullough MRN: [id number] DOB: August 20, 1951  Tim Mccullough is a 72 y.o. year old male who sees Joaquim Nam, MD for primary care. I spoke with  [name] by phone today.  What matters to the patients health and wellness today?  Patient reports his blood pressure today was 97/65 pulse 99. He states he got out of be to quick this morning and experienced some lightheadedness. Patient states his blood pressures normally range in the 120's/60-70's.  Patient reports his next follow up with his primary provider is 02/19/23. Patient states he drinks Dr. Pat Kocher pretty regularly and occasionally eats chocolate.  Patient reports blood sugars are ranging in the low 100's fasting up to 180 after meals. Patient reports blood sugars get up to the around 180 if he "cheats" with his meal.  Patient denies any heart failure symptoms.    Goals Addressed               This Visit's Progress     Manage my Diabetes and heart failure (pt-stated)        Interventions Today    Flowsheet Row Most Recent Value  Chronic Disease   Chronic disease during today's visit Diabetes, Hypertension (HTN), Congestive Heart Failure (CHF)  [Orthostatic hypertension]  General Interventions   General Interventions Discussed/Reviewed General Interventions Reviewed, Doctor Visits  [evaluation of current treatment plan for DM, HTN and patients adherence to plan as established by provider.  Assessed for home blood sugar/ pulse/ blood pressure readings/ heart failure symptoms. Assessed for ongoing dizziness/ lightheadedness.]  Doctor Visits Discussed/Reviewed Doctor Visits Reviewed  [reviewed scheduled/ upcoming provider appointments.]  Education Interventions   Education Provided Provided Education  Provided Verbal Education On Other  [re-discussed importance in rising slow to sitting/ standing position due to orthostatic hypotension.  Advised importance of  decreasing  to avoidance of caffiene intake.  Patient advised to notify primary provider of noted low blood pressure reading.]  Pharmacy Interventions   Pharmacy Dicussed/Reviewed Pharmacy Topics Reviewed  [medications reviewed and compliance discussed.]                  SDOH assessments and interventions completed:  No     Care Coordination Interventions:  Yes, provided   Follow up plan: Follow up call scheduled for 03/24/23    Encounter Outcome:  Pt. Visit Completed   George Ina RN,BSN,CCM [facility] Care Coordination [phone] direct line

## 2023-02-19 ENCOUNTER — Other Ambulatory Visit: Payer: Self-pay | Admitting: Family Medicine

## 2023-02-19 ENCOUNTER — Encounter: Payer: Self-pay | Admitting: Family Medicine

## 2023-02-19 ENCOUNTER — Ambulatory Visit (INDEPENDENT_AMBULATORY_CARE_PROVIDER_SITE_OTHER): Payer: Medicare HMO | Admitting: Family Medicine

## 2023-02-19 VITALS — BP 98/62 | HR 106 | Temp 97.6°F | Ht 70.0 in | Wt 203.0 lb

## 2023-02-19 DIAGNOSIS — R202 Paresthesia of skin: Secondary | ICD-10-CM | POA: Diagnosis not present

## 2023-02-19 DIAGNOSIS — I428 Other cardiomyopathies: Secondary | ICD-10-CM

## 2023-02-19 DIAGNOSIS — E119 Type 2 diabetes mellitus without complications: Secondary | ICD-10-CM

## 2023-02-19 NOTE — Progress Notes (Unsigned)
Recheck BP 102/78.  His cuff.  Recheck blood pressure 100/70, on our cuff.    He isn't lightheaded unless standing really quickly, cautions d/w pt.    BMET and A1C pending.  See notes on labs.  He reported having a home screening with concern for PAD.  No leg cramping with walking.  Some cramping at night but that is not exertional.  No CP.  Can walk flat ground w/o getting winded but some sx with going uphill- this isn't new or different.  He does not have leg cramping going uphill.  Diabetes:  Using medications without difficulties: yes Hypoglycemic episodes:no Hyperglycemic episodes:no Feet problems: he has altered sensation at baseline in L leg/foot from his back, not from DM2.  Blood Sugars averaging: <200.   eye exam within last year: due, dw pt.   Labs pending.   R hand numbness improved with prev injection, but then returned. See AVS.     Meds, vitals, and allergies reviewed.   ROS: Per HPI unless specifically indicated in ROS section   GEN: nad, alert and oriented HEENT: ncat NECK: supple w/o LA CV: rrr. PULM: ctab, no inc wob ABD: soft, +bs EXT: no edema SKIN: no acute rash  Diabetic foot exam: Normal inspection No skin breakdown No calluses  Normal DP and PT pulses B Normal sensation to light touch but altered sensation to monofilament on L foot as expected.  Nails normal 95% pulse ox L hand and L foot.  99% R foot  30 minutes were devoted to patient care in this encounter (this includes time spent reviewing the patient's file/history, interviewing and examining the patient, counseling/reviewing plan with patient).

## 2023-02-19 NOTE — Patient Instructions (Addendum)
Reasonable to see Dr. Patsy Lager but check your A1c first.   Take care.  Glad to see you. Recheck yearly visit this summer, around the end of June.  Labs ahead of time if possible.   We'll update you about your labs.

## 2023-02-20 LAB — HEMOGLOBIN A1C: Mean Plasma Glucose: 189 mg/dL

## 2023-02-21 ENCOUNTER — Telehealth: Payer: Self-pay | Admitting: Family Medicine

## 2023-02-21 NOTE — Assessment & Plan Note (Signed)
See notes on labs.  Continue Farxiga and metformin and Ozempic as is for now.  Recheck periodically.  See after visit summary.

## 2023-02-21 NOTE — Assessment & Plan Note (Signed)
Reasonable to see Dr. Patsy Lager but I want to recheck his A1c first.  See after visit summary.

## 2023-02-21 NOTE — Assessment & Plan Note (Signed)
I do not see any evidence of peripheral arterial disease on his exam and he does not have alarming symptoms.  We opted not to change his blood pressure medication since he is only rarely lightheaded and his blood pressure/pulse appear to be reasonable.  My concern is that changing his medication at this point would be counterproductive.  He understood and agreed.

## 2023-02-21 NOTE — Telephone Encounter (Signed)
You saw this patient previously for carpal tunnel injection.  His A1c is 8.2.  What is your cutoff for repeat injection?  Can you get him set up for follow-up in clinic?  Thanks.

## 2023-02-22 NOTE — Telephone Encounter (Signed)
I injected him for CTS 18 months ago.  We will get him set up in the office for an appointment.    Thanks!  Can you set him up with a follow-up appointment.

## 2023-02-22 NOTE — Telephone Encounter (Signed)
Spoke to pt, scheduled f/u for 02/25/23 

## 2023-02-22 NOTE — Addendum Note (Signed)
Addended by: Alvina Chou on: 02/22/2023 09:18 AM   Modules accepted: Orders

## 2023-02-23 ENCOUNTER — Other Ambulatory Visit (INDEPENDENT_AMBULATORY_CARE_PROVIDER_SITE_OTHER): Payer: Medicare HMO

## 2023-02-23 DIAGNOSIS — R202 Paresthesia of skin: Secondary | ICD-10-CM | POA: Diagnosis not present

## 2023-02-23 DIAGNOSIS — I428 Other cardiomyopathies: Secondary | ICD-10-CM | POA: Diagnosis not present

## 2023-02-23 DIAGNOSIS — E119 Type 2 diabetes mellitus without complications: Secondary | ICD-10-CM | POA: Diagnosis not present

## 2023-02-23 LAB — BASIC METABOLIC PANEL
BUN: 17 mg/dL (ref 6–23)
CO2: 27 mEq/L (ref 19–32)
Calcium: 9.4 mg/dL (ref 8.4–10.5)
Chloride: 102 mEq/L (ref 96–112)
Creatinine, Ser: 0.89 mg/dL (ref 0.40–1.50)
GFR: 86.43 mL/min (ref >60.00)
Glucose, Bld: 176 mg/dL — ABNORMAL HIGH (ref 70–99)
Potassium: 4.4 mEq/L (ref 3.5–5.1)
Sodium: 139 mEq/L (ref 135–145)

## 2023-02-23 LAB — HEMOGLOBIN A1C
Hgb A1c MFr Bld: 8.2 % of total Hgb — ABNORMAL HIGH (ref <5.7)
eAG (mmol/L): 10.4 mmol/L

## 2023-02-24 NOTE — Progress Notes (Unsigned)
    Catlin Aycock T. Louella Medaglia, MD, CAQ Sports Medicine [facility] [address] [place], [address]  Phone: [phone]  FAX: [phone]  RJAY KIRSHENBAUM - 72 y.o. male  MRN [id number]  Date of Birth: 08/26/1951  Date: 02/25/2023  PCP: Joaquim Nam, MD  Referral: Joaquim Nam, MD  No chief complaint on file.  Subjective:   Tim Mccullough is a 72 y.o. very pleasant male patient with There is no height or weight on file to calculate BMI. who presents with the following:  Patient is here in follow follow-up.  I previously saw him in September 2022 with some chronic left shoulder plain.  Intervally, he has had a reverse shoulder arthroplasty by Dr. Everardo Pacific.  He is here to follow-up about joint pains.    Review of Systems is noted in the HPI, as appropriate  Objective:   There were no vitals taken for this visit.  GEN: No acute distress; alert,appropriate. PULM: Breathing comfortably in no respiratory distress PSYCH: Normally interactive.   Laboratory and Imaging Data:  Assessment and Plan:   ***

## 2023-02-25 ENCOUNTER — Encounter: Payer: Self-pay | Admitting: Family Medicine

## 2023-02-25 ENCOUNTER — Ambulatory Visit (INDEPENDENT_AMBULATORY_CARE_PROVIDER_SITE_OTHER): Payer: Medicare HMO | Admitting: Family Medicine

## 2023-02-25 VITALS — BP 100/70 | HR 110 | Temp 98.3°F | Ht 70.0 in | Wt 207.1 lb

## 2023-02-25 DIAGNOSIS — G5601 Carpal tunnel syndrome, right upper limb: Secondary | ICD-10-CM | POA: Diagnosis not present

## 2023-03-22 DIAGNOSIS — D225 Melanocytic nevi of trunk: Secondary | ICD-10-CM | POA: Diagnosis not present

## 2023-03-22 DIAGNOSIS — Z08 Encounter for follow-up examination after completed treatment for malignant neoplasm: Secondary | ICD-10-CM | POA: Diagnosis not present

## 2023-03-22 DIAGNOSIS — Z85820 Personal history of malignant melanoma of skin: Secondary | ICD-10-CM | POA: Diagnosis not present

## 2023-03-22 DIAGNOSIS — Z85828 Personal history of other malignant neoplasm of skin: Secondary | ICD-10-CM | POA: Diagnosis not present

## 2023-03-22 DIAGNOSIS — L821 Other seborrheic keratosis: Secondary | ICD-10-CM | POA: Diagnosis not present

## 2023-03-22 DIAGNOSIS — L570 Actinic keratosis: Secondary | ICD-10-CM | POA: Diagnosis not present

## 2023-03-22 DIAGNOSIS — D2261 Melanocytic nevi of right upper limb, including shoulder: Secondary | ICD-10-CM | POA: Diagnosis not present

## 2023-03-24 ENCOUNTER — Ambulatory Visit: Payer: Self-pay

## 2023-03-24 NOTE — Patient Instructions (Signed)
Visit Information  Thank you for taking time to visit with me today. Please don't hesitate to contact me if I can be of assistance to you.   Following are the goals we discussed today:  Continue to monitor your blood pressure, blood sugar and weights daily and record.  If notices a 3 lbs weight gain or 5 lbs in a week weight gain notify your provider.  Call primary care provider office and schedule your June 2024 follow up appointment Try to incorporate exercise in your daily activity such as walking 30 minutes per day 3 x per week.   Our next appointment is by telephone on 05/17/23 at 11am  Please call the care guide team at [phone] if you need to cancel or reschedule your appointment.   If you are experiencing a Mental Health or Behavioral Health Crisis or need someone to talk to, please call the Suicide and Crisis Lifeline: 988 call 1-800-273-TALK (toll free, 24 hour hotline)  Patient verbalizes understanding of instructions and care plan provided today and agrees to view in MyChart. Active MyChart status and patient understanding of how to access instructions and care plan via MyChart confirmed with patient.     George Ina RN,BSN,CCM [facility] Care Coordination [phone] direct line

## 2023-03-24 NOTE — Patient Outreach (Signed)
  Care Coordination   Follow Up Visit Note   03/24/2023 Name: Tim Mccullough MRN: [id number] DOB: May 22, 1951  Tim Mccullough is a 72 y.o. year old male who sees Tim Nam, MD for primary care. I spoke with  Tim Mccullough by phone today.  What matters to the patients health and wellness today?  Patient reports having follow up visit with primary care provider on 02/19/23.  Hgb A1c increased to 8.4 per lab results.  Patient states his blood sugars are ranging from 150-220. He reports taking his Farxiga, Ozempic and Metfomin as prescribe.  Patient states he probably could [place] more exercises.  Patient reports his weight ranges from 198-201.  Denies any symptoms related to heart failure.  Patient reports blood pressures ranged from 120/60-70's.  He states he continues to have occasional lightheadedness if he stands to fast but states he is managing this better.     Goals Addressed               This Visit's Progress     Manage my Diabetes and heart failure (pt-stated)        Interventions Today    Flowsheet Row Most Recent Value  Chronic Disease   Chronic disease during today's visit Diabetes, Congestive Heart Failure (CHF), Other  [orthostatic HTN]  General Interventions   General Interventions Discussed/Reviewed General Interventions Reviewed, Doctor Visits, Labs  Tim Mccullough of current treatment plan for DM, HF, orthostatic hypertension and patients adherence to plan as established by provider.  Assessed blood sugar and blood pressure readings. Assessed weight.]  Labs Hgb A1c every 3 months  [Reviewed labs. Discussed recent increase in Hgb A1 c]  Doctor Visits Discussed/Reviewed Doctor Visits Reviewed  [reviewed scheduled appointments. Advised patient to call primary provider office and schedule follow up appointment as  per recommended for month of June 2024.]  Exercise Interventions   Exercise Discussed/Reviewed Physical Activity  Physical Activity Discussed/Reviewed Physical  Activity Reviewed  [Advised to incorportate physical activity/ exercise such as walking 30 minutes 3 x per week as tolerated]  Education Interventions   Education Provided Provided Education  Provided Verbal Education On Other  [Advised to continue to monitor blood sugars  / weight / blood pressure and record.]  Nutrition Interventions   Nutrition Discussed/Reviewed Carbohydrate meal planning, Decreasing sugar intake  [Advised to adhere to diabetic low carbohydrate/ decreasing sugar diet.]  Pharmacy Interventions   Pharmacy Dicussed/Reviewed Pharmacy Topics Reviewed  [medications reviewed and compliance discussed.]                  SDOH assessments and interventions completed:  No     Care Coordination Interventions:  Yes, provided   Follow up plan: Follow up call scheduled for 05/17/23    Encounter Outcome:  Pt. Visit Completed   Tim Ina RN,BSN,CCM [facility] Care Coordination [phone] direct line

## 2023-03-30 DIAGNOSIS — R200 Anesthesia of skin: Secondary | ICD-10-CM | POA: Diagnosis not present

## 2023-05-03 ENCOUNTER — Other Ambulatory Visit: Payer: Self-pay | Admitting: Family Medicine

## 2023-05-07 ENCOUNTER — Other Ambulatory Visit: Payer: Self-pay | Admitting: Internal Medicine

## 2023-05-12 ENCOUNTER — Other Ambulatory Visit: Payer: Self-pay | Admitting: Family Medicine

## 2023-05-17 ENCOUNTER — Ambulatory Visit: Payer: Self-pay

## 2023-05-17 NOTE — Patient Outreach (Signed)
  Care Coordination   Follow Up Visit Note   05/17/2023 Name: Tim Mccullough MRN: [id number] DOB: August 19, 1951  Tim Mccullough is a 72 y.o. year old male who sees Joaquim Nam, MD for primary care. I spoke with  [name] by phone today.  What matters to the patients health and wellness today?  Patient reports blood sugars are ranging from 120's to 200. Denies < BS under 70. He reports fasting blood sugar on yesterday was 125.   Patient reports taking last dose of [place] today. He states he is unable to afford refill due to medication costing $460 per month. Patient reports today's weight is 198 lbs. He denies HF symptoms.  Patient states his heart rate continues to range from 110-115 with mild activity. He states his cardiologist is aware.    Goals Addressed               This Visit's Progress     Manage my Diabetes and heart failure (pt-stated)        Interventions Today    Flowsheet Row Most Recent Value  Chronic Disease   Chronic disease during today's visit Diabetes, Congestive Heart Failure (CHF), Other  [orthostatic hypertension]  General Interventions   General Interventions Discussed/Reviewed General Interventions Reviewed, Doctor Visits, Labs  [place] of current treatment plan for DM, HF, orthostatic hypertension and patients adherence to plan as established by provider. Assessed BS, BP readings and for HF symptoms.]  Labs Hgb A1c every 3 months  [Discussed Hgb A1c and goal. Patient advised to contact provider office to schedule follow up visit with PCP.]  Doctor Visits Discussed/Reviewed Doctor Visits Reviewed  [name] scheduled/ upcoming provider visits]  Exercise Interventions   Exercise Discussed/Reviewed Physical Activity  Education Interventions   Education Provided Provided Education  [reviewed signs/ symptoms of HF. Advised to continue monitoring BS, BP, weight.]  Provided Verbal Education On Blood Sugar Monitoring  Pharmacy Interventions   Pharmacy  Dicussed/Reviewed Pharmacy Topics Reviewed  [medications reviewed/ compliance discussed.]                  SDOH assessments and interventions completed:  No     Care Coordination Interventions:  Yes, provided   Follow up plan: Follow up call scheduled for 06/30/23    Encounter Outcome:  Pt. Visit Completed   George Ina RN,BSN,CCM [facility] Care Coordination [phone] direct line

## 2023-05-17 NOTE — Patient Instructions (Signed)
Visit Information  Thank you for taking time to visit with me today. Please don't hesitate to contact me if I can be of assistance to you.   Following are the goals we discussed today:   Goals Addressed               This Visit's Progress     Manage my Diabetes and heart failure (pt-stated)        Interventions Today    Flowsheet Row Most Recent Value  Chronic Disease   Chronic disease during today's visit Diabetes, Congestive Heart Failure (CHF), Other  [orthostatic hypertension]  General Interventions   General Interventions Discussed/Reviewed General Interventions Reviewed, Doctor Visits, Labs  [place] of current treatment plan for DM, HF, orthostatic hypertension and patients adherence to plan as established by provider. Assessed BS, BP readings and for HF symptoms.]  Labs Hgb A1c every 3 months  [Discussed Hgb A1c and goal. Patient advised to contact provider office to schedule follow up visit with PCP.]  Doctor Visits Discussed/Reviewed Doctor Visits Reviewed  [name] scheduled/ upcoming provider visits]  Exercise Interventions   Exercise Discussed/Reviewed Physical Activity  Education Interventions   Education Provided Provided Education  [reviewed signs/ symptoms of HF. Advised to continue monitoring BS, BP, weight.]  Provided Verbal Education On Blood Sugar Monitoring  Pharmacy Interventions   Pharmacy Dicussed/Reviewed Pharmacy Topics Reviewed  [medications reviewed/ compliance discussed.]                  Our next appointment is by telephone on 06/30/23 at 11 am  Please call the care guide team at [phone] if you need to cancel or reschedule your appointment.   If you are experiencing a Mental Health or Behavioral Health Crisis or need someone to talk to, please call the Suicide and Crisis Lifeline: 988 call 1-800-273-TALK (toll free, 24 hour hotline)  Patient verbalizes understanding of instructions and care plan provided today and agrees to view  in MyChart. Active MyChart status and patient understanding of how to access instructions and care plan via MyChart confirmed with patient.     George Ina RN,BSN,CCM [facility] Care Coordination [phone] direct line

## 2023-05-25 ENCOUNTER — Ambulatory Visit (INDEPENDENT_AMBULATORY_CARE_PROVIDER_SITE_OTHER): Payer: Medicare HMO

## 2023-05-25 ENCOUNTER — Telehealth: Payer: Self-pay

## 2023-05-25 VITALS — BP 120/65 | Ht 70.0 in | Wt 197.0 lb

## 2023-05-25 DIAGNOSIS — Z0000 Encounter for general adult medical examination without abnormal findings: Secondary | ICD-10-CM | POA: Diagnosis not present

## 2023-05-25 NOTE — Telephone Encounter (Signed)
PAP application for FARXIGA(AZ&ME)  has been mailed to pt home.   I will fax PCP pages once I receive pt pages   PLEASE BE ADVISED

## 2023-05-25 NOTE — Progress Notes (Signed)
 Because this visit was a virtual/telehealth visit,  certain criteria was not obtained, such a blood pressure, CBG if patient is a diabetic, and timed up and go. Any medications not marked as "taking" was not mentioned during the medication reconciliation part of the visit. Any vitals not documented were not able to be obtained due to this being a telehealth visit. Vitals documented are verbally provided by the patient.   Subjective:   Tim Mccullough is a 72 y.o. male who presents for Medicare Annual/Subsequent preventive examination.  Visit Complete: Virtual  I connected with  [name] on 05/25/23 by a audio enabled telemedicine application and verified that I am speaking with the correct person using two identifiers.  Patient Location: Home  Provider Location: Home Office  I discussed the limitations of evaluation and management by telemedicine. The patient expressed understanding and agreed to proceed.  Patient Medicare AWV questionnaire was completed by the patient on n/a; I have confirmed that all information answered by patient is correct and no changes since this date.  Review of Systems     Cardiac Risk Factors include: advanced age (>79men, >50 women);diabetes mellitus;dyslipidemia;hypertension;male gender;sedentary lifestyle     Objective:    Today's Vitals   05/25/23 0837  BP: 120/65  Weight: 197 lb (89.4 kg)  Height: 5\' 10"  (1.778 m)   Body mass index is 28.27 kg/m.     05/25/2023    8:49 AM 05/21/2022   11:37 AM 10/31/2021    1:10 PM 01/18/2021    9:16 PM 03/18/2020    5:57 AM 01/29/2020    1:46 PM 01/04/2020   10:36 AM  Advanced Directives  Does Patient Have a Medical Advance Directive? No No No No No No No  Would patient like information on creating a medical advance directive? No - Patient declined    Yes (MAU/Ambulatory/Procedural Areas - Information given) No - Patient declined Yes (ED - Information included in AVS)    Current Medications  (verified) Outpatient Encounter Medications as of 05/25/2023  Medication Sig   aspirin EC 81 MG tablet Take 81 mg by mouth daily. Swallow whole.   atorvastatin (LIPITOR) 40 MG tablet TAKE 1 TABLET BY MOUTH EVERY DAY   carvedilol (COREG) 12.5 MG tablet TAKE 1 TABLET BY MOUTH TWICE A DAY   doxazosin (CARDURA) 1 MG tablet TAKE 1 TABLET BY MOUTH EVERY DAY   fish oil-omega-3 fatty acids 1000 MG capsule Take 1 g by mouth 2 (two) times daily.    lansoprazole (PREVACID) 15 MG capsule Take 15 mg by mouth daily at 12 noon.   losartan (COZAAR) 25 MG tablet Take 0.5 tablets (12.5 mg total) by mouth daily.   metFORMIN (GLUCOPHAGE) 500 MG tablet TAKE 2 TABLETS (1,000 MG TOTAL) BY MOUTH 2 (TWO) TIMES DAILY WITH A MEAL.   ONETOUCH ULTRA test strip USE DAILY TO CHECK SUGAR. E11.9. ONE TOUCH TEST STRIPS.   OZEMPIC, 0.25 OR 0.5 MG/DOSE, 2 MG/3ML SOPN INJECT 0.25 MG INTO THE SKIN ONCE A WEEK. FOR 4 WEEKS   PARoxetine (PAXIL) 20 MG tablet TAKE 1 TABLET BY MOUTH EVERY DAY   spironolactone (ALDACTONE) 25 MG tablet TAKE 1/2 TABLET BY MOUTH EVERY DAY   esomeprazole (NEXIUM) 20 MG capsule Take 20 mg by mouth daily at 12 noon. (Patient not taking: Reported on 05/25/2023)   FARXIGA 5 MG TABS tablet TAKE 1 TABLET BY MOUTH DAILY BEFORE BREAKFAST. (Patient not taking: Reported on 05/17/2023)   omeprazole (PRILOSEC) 20 MG capsule Take 1  capsule (20 mg total) by mouth daily. (Patient not taking: Reported on 05/25/2023)   No facility-administered encounter medications on file as of 05/25/2023.    Allergies (verified) Patient has no known allergies.   History: Past Medical History:  Diagnosis Date   Abnormal echocardiogram 12/2009   Repeated after med mgmt of cardiomyopathy showed EF 40-45%, mildly dilated LV, mild LV hypertrophy, anterolateral and apical hypokinesis, mild diastolic dysfunction.   Anemia    hx   Anxiety    Arthritis    Blood transfusion without reported diagnosis 09/2012   BPH (benign prostatic  hypertrophy) 2004   Cancer [facility])    melanoma back   Cardiomyopathy    ? tachycardia-mediated   Cataract    Depression    Diabetes mellitus 11/2006   Type II   Diverticulosis    Echocardiogram abnormal 05/2009   Mild LVH with a mildly dilated LV.  EF was difficult to assess given poor acoustic windows but appeared moderately decreased.  Mild MR.  Probable small perimembranous VSD, mild LAE   Esophageal erosions    GERD (gastroesophageal reflux disease)    H/O cardiac radiofrequency ablation    Hip pain    History of MRI 05/2009   Cardiac MRI was done to followup difficult echo.  This showed mild to moderately dilated LV, mild to moderate LAE, EF  33% with global hypokinesis, normal RV size with mild systolic dysfunction.  There was no large VSD evident.  There was no myocardial delayed enhancement so no evidence for infiltrative disease or infarction.  LHC (7/10) showed only luminal irreg. in the coronaries and EF 35%   Holter monitor, abnormal 05/2009   Frequent PAC's and PVC's  Average HR 96  (but he had started Toprol XL).  HR range 71-130   Hyperlipidemia 04/1998   Hypertension 1995   Obesity    OSA (obstructive sleep apnea)    Not using CPAP- improved with weight loss   Pneumonia    HX OF pna   Sinus tachycardia    Pt says he has been told his heart rate is high since he was in high school.    Sleep apnea    has cpap; but does not use   VSD (ventricular septal defect)    Probable small perimembranous   Past Surgical History:  Procedure Laterality Date   CARDIAC CATHETERIZATION  05/30/2009   Dilated cardiomyopathy.  min VSD.  Hypokin Inf  wall.  Multiple min luminal Irr.  EF 35%   CATARACT EXTRACTION, BILATERAL     COLONOSCOPY     COLONOSCOPY W/ BIOPSIES  08/24/2006   Divertics, polyps x3, bx negative, repeat 2012 with mild diverticulosis in teh sigmoid colon and 1 polyp in tranverse colon, repeat due 2017   ESOPHAGOGASTRODUODENOSCOPY  2012   1)Barrett's esoph, 2)  Erosive esophagitis, 3) Mild gastritis in the antrum, 4) Duodenitis in the bulb of duodenum, 5) Small hiatal hernia   INGUINAL HERNIA REPAIR  1985   Left   INGUINAL HERNIA REPAIR  1999   Right   KNEE ARTHROSCOPY  10/15/2011   Procedure: ARTHROSCOPY KNEE;  Surgeon: Loreta Ave, MD;  Location:  SURGERY CENTER;  Service: Orthopedics;  Laterality: Right;  right knee scope with lateral meniscectomy, removal loose foreign body, and microfracture technique   LAMINECTOMY  1987   with discectomy   PVC ABLATION N/A 03/18/2020   Procedure: PVC ABLATION;  Surgeon: Marinus Maw, MD;  Location: [facility]VAS[facility];  Service: Cardiovascular;  Laterality: N/A;   REVERSE SHOULDER ARTHROPLASTY Left 11/19/2021   Procedure: REVERSE SHOULDER ARTHROPLASTY;  Surgeon: Bjorn Pippin, MD;  Location: [facility] ORS;  Service: Orthopedics;  Laterality: Left;   RIGHT/LEFT HEART CATH AND CORONARY ANGIOGRAPHY N/A 12/25/2019   Procedure: RIGHT/LEFT HEART CATH AND CORONARY ANGIOGRAPHY;  Surgeon: Iran Ouch, MD;  Location: [facility] INVASIVE CV LAB;  Service: Cardiovascular;  Laterality: N/A;   ROTATOR CUFF REPAIR  12/2009   rt   TOTAL HIP ARTHROPLASTY  2011   Left   TOTAL KNEE ARTHROPLASTY  06/01/2012   Procedure: TOTAL KNEE ARTHROPLASTY;  Surgeon: Loreta Ave, MD;  Location[facility];  Service: Orthopedics;  Laterality: Right;   UPPER GASTROINTESTINAL ENDOSCOPY     Family History  Problem Relation Age of Onset   Hypertension Mother    Alzheimer's disease Mother    Heart disease Father        CABG   Stroke Father    Diabetes Father    Diabetes Sister    Hypertension Sister    Cancer Sister        uterine cancer   Diabetes Sister    Hypertension Sister    Colon polyps Brother    Diabetes Brother    Throat cancer Brother        Throat   Esophageal cancer Brother    Diabetes Maternal Grandmother        Insulin   Esophageal cancer Maternal Grandfather    Rectal cancer Cousin    Colon cancer  Cousin    Throat cancer Other        Throat   Diabetes Other    Alcohol abuse Other    Drug abuse Neg Hx    Prostate cancer Neg Hx    Stomach cancer Neg Hx    Social History   Socioeconomic History   Marital status: Single    Spouse name: Not on file   Number of children: 3   Years of education: Not on file   Highest education level: Not on file  Occupational History   Occupation: Health visitor: Therapist, music   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  Tobacco Use   Smoking status: Former    Current packs/day: 0.00    Types: Cigarettes    Start date: 11/09/1978    Quit date: 11/09/1998    Years since quitting: 24.5   Smokeless tobacco: Former    Quit date: 06/03/2007   Tobacco comments:    no alcohol since nov 12  Vaping Use   Vaping status: Never Used  Substance and Sexual Activity   Alcohol use: No    Alcohol/week: 0.0 standard drinks of alcohol    Comment: sober as 09/2011   Drug use: No   Sexual activity: Not Currently  Other Topics Concern   Not on file  Social History Narrative   Lives in [place], lives alone   3 kids (2 sons, 1 daughter)   Divorced   Social Determinants of Health   Financial Resource Strain: Low Risk  (05/25/2023)   Overall Financial Resource Strain (CARDIA)    Difficulty of Paying Living Expenses: Not hard at all  Food Insecurity: No Food Insecurity (05/25/2023)   Hunger Vital Sign    Worried About Running Out of Food in the Last Year: Never true    Ran Out of Food in the Last Year: Never true  Transportation Needs: No Transportation Needs (05/25/2023)   PRAPARE - Transportation    Lack  of Transportation (Medical): No    Lack of Transportation (Non-Medical): No  Physical Activity: Inactive (05/25/2023)   Exercise Vital Sign    Days of Exercise per Week: 0 days    Minutes of Exercise per Session: 0 min  Stress: No Stress Concern Present (05/25/2023)   [employer] of Occupational Health - Occupational Stress Questionnaire     Feeling of Stress : Not at all  Social Connections: Socially Isolated (05/25/2023)   Social Connection and Isolation Panel [NHANES]    Frequency of Communication with Friends and Family: More than three times a week    Frequency of Social Gatherings with Friends and Family: More than three times a week    Attends Religious Services: Never    Database administrator or Organizations: No    Attends Engineer, structural: Never    Marital Status: Divorced    Tobacco Counseling Counseling given: Yes Tobacco comments: no alcohol since nov 12   Clinical Intake:  Pre-visit preparation completed: Yes  Pain : No/denies pain     BMI - recorded: 28.27 Nutritional Status: BMI 25 -29 Overweight Nutritional Risks: None Diabetes: Yes CBG done?: No (telehealth visit. patient states his CBG was 180 this morning) Did pt. bring in CBG monitor from home?: No  How often do you need to have someone help you when you read instructions, pamphlets, or other written materials from your doctor or pharmacy?: 1 - Never  Interpreter Needed?: No  Information entered by ::  Zinia Innocent, CMA   Activities of Daily Living    05/25/2023    8:40 AM  In your present state of health, do you have any difficulty performing the following activities:  Hearing? 0  Vision? 0  Difficulty concentrating or making decisions? 0  Walking or climbing stairs? 0  Dressing or bathing? 0  Doing errands, shopping? 0  Preparing Food and eating ? N  Using the Toilet? N  In the past six months, have you accidently leaked urine? N  Do you have problems with loss of bowel control? N  Managing your Medications? N  Managing your Finances? N  Housekeeping or managing your Housekeeping? N    Patient Care Team: Joaquim Nam, MD as PCP - General (Family Medicine) Iran Ouch, MD as PCP - Cardiology (Cardiology) Otho Ket, RN as [facility] Care Management  Indicate any recent Medical  Services you may have received from other than [facility] providers in the past year (date may be approximate).     Assessment:   This is a routine wellness examination for [place].  Hearing/Vision screen Hearing Screening - Comments:: Patient denies any hearing difficulties.    Dietary issues and exercise activities discussed:     Goals Addressed               This Visit's Progress     Patient Stated (pt-stated)        Exercise more       Depression Screen    05/25/2023    8:47 AM 02/19/2023    3:52 PM 08/04/2022   11:20 AM 05/21/2022   11:37 AM 07/03/2021   12:42 PM 03/14/2020    1:36 PM 02/05/2020    1:07 PM  PHQ 2/9 Scores  PHQ - 2 Score 0 0 0 0 0 2 2  PHQ- 9 Score   0  0 5 6    Fall Risk    05/25/2023    8:50 AM 02/19/2023  3:51 PM 12/04/2022   12:02 PM 05/21/2022   11:37 AM 04/23/2022   12:31 PM  Fall Risk   Falls in the past year? 0 0 0 0 0  Number falls in past yr: 0 0 0 0 0  Injury with Fall? 0 0 0 0 0  Risk for fall due to : No Fall Risks No Fall Risks No Fall Risks Medication side effect History of fall(s)  Follow up Falls prevention discussed Falls evaluation completed Falls evaluation completed Falls evaluation completed;Education provided;Falls prevention discussed Falls evaluation completed    MEDICARE RISK AT HOME:  Medicare Risk at Home - 05/25/23 0850     Any stairs in or around the home? No    If so, are there any without handrails? No    Home free of loose throw rugs in walkways, pet beds, electrical cords, etc? Yes    Adequate lighting in your home to reduce risk of falls? Yes    Life alert? No    Use of a cane, walker or w/c? No    Grab bars in the bathroom? Yes    Shower chair or bench in shower? Yes    Elevated toilet seat or a handicapped toilet? No             TIMED UP AND GO:  Was the test performed?  No    Cognitive Function:    04/26/2019    9:33 AM 04/08/2018    9:15 AM  MMSE - Mini Mental State Exam  Orientation to time 5  5  Orientation to Place 5 5  Registration 3 3  Attention/ Calculation 0 0  Recall 3 1  Recall-comments  unable to recall 2 of 3 words  Language- name 2 objects 0 0  Language- repeat 1 1  Language- follow 3 step command 0 3  Language- read & follow direction 0 0  Write a sentence 0 0  Copy design 0 0  Total score 17 18        05/25/2023    8:50 AM 05/21/2022   11:39 AM  6CIT Screen  What Year? 0 points 0 points  What month? 0 points 0 points  What time? 0 points 0 points  Count back from 20 0 points 0 points  Months in reverse 0 points 0 points  Repeat phrase 0 points 2 points  Total Score 0 points 2 points    Immunizations Immunization History  Administered Date(s) Administered   Fluad Quad(high Dose 65+) 08/28/2019, 08/04/2022   Influenza Whole 08/28/1998, 08/09/2010   Influenza,inj,Quad PF,6+ Mos 07/28/2013, 08/17/2014, 08/20/2015, 08/24/2016, 08/23/2017, 07/18/2018   Influenza-Unspecified 08/09/2021   Moderna Sars-Covid-2 Vaccination 12/23/2019, 01/19/2020, 09/15/2020   Pneumococcal Conjugate-13 08/23/2017   Pneumococcal Polysaccharide-23 06/03/2012, 10/20/2018   Pneumococcal-Unspecified 06/01/2012   Td 11/09/1997   Tdap 08/07/2011, 04/08/2013    TDAP status: Up to date  Flu Vaccine status: Up to date  Pneumococcal vaccine status: Up to date  Covid-19 vaccine status: Completed vaccines  Qualifies for Shingles Vaccine? Yes   Zostavax completed No   Shingrix Completed?: No.    Education has been provided regarding the importance of this vaccine. Patient has been advised to call insurance company to determine out of pocket expense if they have not yet received this vaccine. Advised may also receive vaccine at local pharmacy or Health Dept. Verbalized acceptance and understanding.  Screening Tests Health Maintenance  Topic Date Due   Zoster Vaccines- Shingrix (1 of 2) Never done  Diabetic kidney evaluation - Urine ACR  06/16/2012   OPHTHALMOLOGY EXAM   01/08/2023   DTaP/Tdap/Td (4 - Td or Tdap) 04/09/2023   Medicare Annual Wellness (AWV)  05/22/2023   INFLUENZA VACCINE  06/10/2023   HEMOGLOBIN A1C  08/21/2023   FOOT EXAM  02/19/2024   Diabetic kidney evaluation - eGFR measurement  02/23/2024   Colonoscopy  02/25/2026   Pneumonia Vaccine 32+ Years old  Completed   Hepatitis C Screening  Completed   HPV VACCINES  Aged Out   COVID-19 Vaccine  Discontinued    Health Maintenance  Health Maintenance Due  Topic Date Due   Zoster Vaccines- Shingrix (1 of 2) Never done   Diabetic kidney evaluation - Urine ACR  06/16/2012   OPHTHALMOLOGY EXAM  01/08/2023   DTaP/Tdap/Td (4 - Td or Tdap) 04/09/2023   Medicare Annual Wellness (AWV)  05/22/2023    Colorectal cancer screening: Type of screening: Colonoscopy. Completed 02/26/2016. Repeat every 10 years  Lung Cancer Screening: (Low Dose CT Chest recommended if Age 12-80 years, 20 pack-year currently smoking OR have quit w/in 15years.) does not qualify.   Additional Screening:  Hepatitis C Screening: does not qualify; Completed 03/02/2016  Vision Screening: Recommended annual ophthalmology exams for early detection of glaucoma and other disorders of the eye. Is the patient up to date with their annual eye exam?  Yes  Who is the provider or what is the name of the office in which the patient attends annual eye exams? [employer] [employer] If pt is not established with a provider, would they like to be referred to a provider to establish care? No .   Dental Screening: Recommended annual dental exams for proper oral hygiene  Diabetic Foot Exam: Diabetic Foot Exam: Completed 02/19/2023  Community Resource Referral / Chronic Care Management: CRR required this visit?  No   CCM required this visit?  No     Plan:     I have personally reviewed and noted the following in the patient's chart:   Medical and social history Use of alcohol, tobacco or illicit drugs  Current medications and  supplements including opioid prescriptions. Patient is not currently taking opioid prescriptions. Functional ability and status Nutritional status Physical activity Advanced directives List of other physicians Hospitalizations, surgeries, and ER visits in previous 12 months Vitals Screenings to include cognitive, depression, and falls Referrals and appointments  In addition, I have reviewed and discussed with patient certain preventive protocols, quality metrics, and best practice recommendations. A written personalized care plan for preventive services as well as general preventive health recommendations were provided to patient.     Jordan Hawks Anapaola Kinsel, CMA   05/25/2023   After Visit Summary: (MyChart) Due to this being a telephonic visit, the after visit summary with patients personalized plan was offered to patient via MyChart   Nurse Notes:

## 2023-05-25 NOTE — Patient Instructions (Signed)
Tim Mccullough , Thank you for taking time to come for your Medicare Wellness Visit. I appreciate your ongoing commitment to your health goals. Please review the following plan we discussed and let me know if I can assist you in the future.   These are the goals we discussed:  Goals       Manage my Diabetes and heart failure (pt-stated)      Interventions Today    Flowsheet Row Most Recent Value  Chronic Disease   Chronic disease during today's visit Diabetes, Congestive Heart Failure (CHF), Other  [orthostatic hypertension]  General Interventions   General Interventions Discussed/Reviewed General Interventions Reviewed, Doctor Visits, Labs  Tim Mccullough of current treatment plan for DM, HF, orthostatic hypertension and patients adherence to plan as established by provider. Assessed BS, BP readings and for HF symptoms.]  Labs Hgb A1c every 3 months  [Discussed Hgb A1c and goal. Patient advised to contact provider office to schedule follow up visit with PCP.]  Doctor Visits Discussed/Reviewed Doctor Visits Reviewed  Tim Mccullough scheduled/ upcoming provider visits]  Exercise Interventions   Exercise Discussed/Reviewed Physical Activity  Education Interventions   Education Provided Provided Education  [reviewed signs/ symptoms of HF. Advised to continue monitoring BS, BP, weight.]  Provided Verbal Education On Blood Sugar Monitoring  Pharmacy Interventions   Pharmacy Dicussed/Reviewed Pharmacy Topics Reviewed  [medications reviewed/ compliance discussed.]                Patient Stated      Starting 04/26/19, I will continue to take medications as prescribed.        Patient Stated      05/21/2022, wants to lose 20-30 pounds      Patient Stated (pt-stated)      Exercise more        This is a list of the screening recommended for you and due dates:  Health Maintenance  Topic Date Due   Zoster (Shingles) Vaccine (1 of 2) Never done   Yearly kidney health urinalysis for diabetes   06/16/2012   Eye exam for diabetics  01/08/2023   DTaP/Tdap/Td vaccine (4 - Td or Tdap) 04/09/2023   Flu Shot  06/10/2023   Hemoglobin A1C  08/21/2023   Complete foot exam   02/19/2024   Yearly kidney function blood test for diabetes  02/23/2024   Medicare Annual Wellness Visit  05/24/2024   Colon Cancer Screening  02/25/2026   Pneumonia Vaccine  Completed   Hepatitis C Screening  Completed   HPV Vaccine  Aged Out   COVID-19 Vaccine  Discontinued    Advanced directives: Advance directive discussed with you today. Even though you declined this today, please call our office should you change your mind, and we can give you the proper paperwork for you to fill out. Advance care planning is a way to make decisions about medical care that fits your values in case you are ever unable to make these decisions for yourself.  Information on Advanced Care Planning can be found at [facility] Advance Health Care Directives Advance Health Care Directives ([email or web address]    Conditions/risks identified:  Aim for 30 minutes of exercise or brisk walking, 6-8 glasses of water, and 5 servings of fruits and vegetables each day. Exercises to do While Sitting  Exercises that you do while sitting (chair exercises) can give you many of the same benefits as full exercise. Benefits include strengthening your heart, burning calories, and keeping muscles and joints healthy. Exercise can also  improve your mood and help with depression and anxiety. You may benefit from chair exercises if you are unable to do standing exercises due to: Diabetic foot pain. Obesity. Illness. Arthritis. Recovery from surgery or injury. Breathing problems. Balance problems. Another type of disability. Before starting chair exercises, check with your health care provider or a physical therapist to find out how much exercise you can tolerate and which exercises are safe for you. If your health care provider  approves: Start out slowly and build up over time. Aim to work up to about 10-20 minutes for each exercise session. Make exercise part of your daily routine. Drink water when you exercise. Do not wait until you are thirsty. Drink every 10-15 minutes. Stop exercising right away if you have pain, nausea, shortness of breath, or dizziness. If you are exercising in a wheelchair, make sure to lock the wheels. Ask your health care provider whether you can do tai chi or yoga. Many positions in these mind-body exercises can be modified to do while seated. Warm-up Before starting other exercises: Sit up as straight as you can. Have your knees bent at 90 degrees, which is the shape of the capital letter "L." Keep your feet flat on the floor. Sit at the front edge of your chair, if you can. Pull in (tighten) the muscles in your abdomen and stretch your spine and neck as straight as you can. Hold this position for a few minutes. Breathe in and out evenly. Try to concentrate on your breathing, and relax your mind. Stretching Exercise A: Arm stretch Hold your arms out straight in front of your body. Bend your hands at the wrist with your fingers pointing up, as if signaling someone to stop. Notice the slight tension in your forearms as you hold the position. Keeping your arms out and your hands bent, rotate your hands outward as far as you can and hold this stretch. Aim to have your thumbs pointing up and your pinkie fingers pointing down. Slowly repeat arm stretches for one minute as tolerated. Exercise B: Leg stretch If you can move your legs, try to "draw" letters on the floor with the toes of your foot. Write your name with one foot. Write your name with the toes of your other foot. Slowly repeat the movements for one minute as tolerated. Exercise C: Reach for the sky Reach your hands as far over your head as you can to stretch your spine. Move your hands and arms as if you are climbing a  rope. Slowly repeat the movements for one minute as tolerated. Range of motion exercises Exercise A: Shoulder roll Let your arms hang loosely at your sides. Lift just your shoulders up toward your ears, then let them relax back down. When your shoulders feel loose, rotate your shoulders in backward and forward circles. Do shoulder rolls slowly for one minute as tolerated. Exercise B: March in place As if you are marching, pump your arms and lift your legs up and down. Lift your knees as high as you can. If you are unable to lift your knees, just pump your arms and move your ankles and feet up and down. March in place for one minute as tolerated. Exercise C: Seated jumping jacks Let your arms hang down straight. Keeping your arms straight, lift them up over your head. Aim to point your fingers to the ceiling. While you lift your arms, straighten your legs and slide your heels along the floor to your sides, as wide  as you can. As you bring your arms back down to your sides, slide your legs back together. If you are unable to use your legs, just move your arms. Slowly repeat seated jumping jacks for one minute as tolerated. Strengthening exercises Exercise A: Shoulder squeeze Hold your arms straight out from your body to your sides, with your elbows bent and your fists pointed at the ceiling. Keeping your arms in the bent position, move them forward so your elbows and forearms meet in front of your face. Open your arms back out as wide as you can with your elbows still bent, until you feel your shoulder blades squeezing together. Hold for 5 seconds. Slowly repeat the movements forward and backward for one minute as tolerated. Contact a health care provider if: You have to stop exercising due to any of the following: Pain. Nausea. Shortness of breath. Dizziness. Fatigue. You have significant pain or soreness after exercising. Get help right away if: You have chest pain. You have  difficulty breathing. These symptoms may represent a serious problem that is an emergency. Do not wait to see if the symptoms will go away. Get medical help right away. Call your local emergency services (911 in the [place] Do not drive yourself to the hospital. Summary Exercises that you do while sitting (chair exercises) can strengthen your heart, burn calories, and keep muscles and joints healthy. You may benefit from chair exercises if you are unable to do standing exercises due to diabetic foot pain, obesity, recovery from surgery or injury, or other conditions. Before starting chair exercises, check with your health care provider or a physical therapist to find out how much exercise you can tolerate and which exercises are safe for you. This information is not intended to replace advice given to you by your health care provider. Make sure you discuss any questions you have with your health care provider. Document Revised: 12/22/2020 Document Reviewed: 12/22/2020 Elsevier Patient Education  2024 Elsevier Inc.  Next appointment: VIRTUAL/ TELEPHONE VISIT Follow up in one year for your annual wellness visit  May 25, 2024 at 9:15 am telephone visit   Preventive Care 65 Years and Older, Male  Preventive care refers to lifestyle choices and visits with your health care provider that can promote health and wellness. What does preventive care include? A yearly physical exam. This is also called an annual well check. Dental exams once or twice a year. Routine eye exams. Ask your health care provider how often you should have your eyes checked. Personal lifestyle choices, including: Daily care of your teeth and gums. Regular physical activity. Eating a healthy diet. Avoiding tobacco and drug use. Limiting alcohol use. Practicing safe sex. Taking low doses of aspirin every day. Taking vitamin and mineral supplements as recommended by your health care provider. What happens during an annual  well check? The services and screenings done by your health care provider during your annual well check will depend on your age, overall health, lifestyle risk factors, and family history of disease. Counseling  Your health care provider may ask you questions about your: Alcohol use. Tobacco use. Drug use. Emotional well-being. Home and relationship well-being. Sexual activity. Eating habits. History of falls. Memory and ability to understand (cognition). Work and work Astronomer. Screening  You may have the following tests or measurements: Height, weight, and BMI. Blood pressure. Lipid and cholesterol levels. These may be checked every 5 years, or more frequently if you are over 71 years old. Skin check. Lung cancer  screening. You may have this screening every year starting at age 1 if you have a 30-pack-year history of smoking and currently smoke or have quit within the past 15 years. Fecal occult blood test (FOBT) of the stool. You may have this test every year starting at age 33. Flexible sigmoidoscopy or colonoscopy. You may have a sigmoidoscopy every 5 years or a colonoscopy every 10 years starting at age 71. Prostate cancer screening. Recommendations will vary depending on your family history and other risks. Hepatitis C blood test. Hepatitis B blood test. Sexually transmitted disease (STD) testing. Diabetes screening. This is done by checking your blood sugar (glucose) after you have not eaten for a while (fasting). You may have this done every 1-3 years. Abdominal aortic aneurysm (AAA) screening. You may need this if you are a current or former smoker. Osteoporosis. You may be screened starting at age 28 if you are at high risk. Talk with your health care provider about your test results, treatment options, and if necessary, the need for more tests. Vaccines  Your health care provider may recommend certain vaccines, such as: Influenza vaccine. This is recommended every  year. Tetanus, diphtheria, and acellular pertussis (Tdap, Td) vaccine. You may need a Td booster every 10 years. Zoster vaccine. You may need this after age 67. Pneumococcal 13-valent conjugate (PCV13) vaccine. One dose is recommended after age 61. Pneumococcal polysaccharide (PPSV23) vaccine. One dose is recommended after age 40. Talk to your health care provider about which screenings and vaccines you need and how often you need them. This information is not intended to replace advice given to you by your health care provider. Make sure you discuss any questions you have with your health care provider. Document Released: 11/22/2015 Document Revised: 07/15/2016 Document Reviewed: 08/27/2015 Elsevier Interactive Patient Education  2017 [employer].  Fall Prevention in the Home Falls can cause injuries. They can happen to people of all ages. There are many things you can do to make your home safe and to help prevent falls. What can I do on the outside of my home? Regularly fix the edges of walkways and driveways and fix any cracks. Remove anything that might make you trip as you walk through a door, such as a raised step or threshold. Trim any bushes or trees on the path to your home. Use bright outdoor lighting. Clear any walking paths of anything that might make someone trip, such as rocks or tools. Regularly check to see if handrails are loose or broken. Make sure that both sides of any steps have handrails. Any raised decks and porches should have guardrails on the edges. Have any leaves, snow, or ice cleared regularly. Use sand or salt on walking paths during winter. Clean up any spills in your garage right away. This includes oil or grease spills. What can I do in the bathroom? Use night lights. Install grab bars by the toilet and in the tub and shower. Do not use towel bars as grab bars. Use non-skid mats or decals in the tub or shower. If you need to sit down in the shower, use a  plastic, non-slip stool. Keep the floor dry. Clean up any water that spills on the floor as soon as it happens. Remove soap buildup in the tub or shower regularly. Attach bath mats securely with double-sided non-slip rug tape. Do not have throw rugs and other things on the floor that can make you trip. What can I do in the bedroom? Use night  lights. Make sure that you have a light by your bed that is easy to reach. Do not use any sheets or blankets that are too big for your bed. They should not hang down onto the floor. Have a firm chair that has side arms. You can use this for support while you get dressed. Do not have throw rugs and other things on the floor that can make you trip. What can I do in the kitchen? Clean up any spills right away. Avoid walking on wet floors. Keep items that you use a lot in easy-to-reach places. If you need to reach something above you, use a strong step stool that has a grab bar. Keep electrical cords out of the way. Do not use floor polish or wax that makes floors slippery. If you must use wax, use non-skid floor wax. Do not have throw rugs and other things on the floor that can make you trip. What can I do with my stairs? Do not leave any items on the stairs. Make sure that there are handrails on both sides of the stairs and use them. Fix handrails that are broken or loose. Make sure that handrails are as long as the stairways. Check any carpeting to make sure that it is firmly attached to the stairs. Fix any carpet that is loose or worn. Avoid having throw rugs at the top or bottom of the stairs. If you do have throw rugs, attach them to the floor with carpet tape. Make sure that you have a light switch at the top of the stairs and the bottom of the stairs. If you do not have them, ask someone to add them for you. What else can I do to help prevent falls? Wear shoes that: Do not have high heels. Have rubber bottoms. Are comfortable and fit you  well. Are closed at the toe. Do not wear sandals. If you use a stepladder: Make sure that it is fully opened. Do not climb a closed stepladder. Make sure that both sides of the stepladder are locked into place. Ask someone to hold it for you, if possible. Clearly mark and make sure that you can see: Any grab bars or handrails. First and last steps. Where the edge of each step is. Use tools that help you move around (mobility aids) if they are needed. These include: Canes. Walkers. Scooters. Crutches. Turn on the lights when you go into a dark area. Replace any light bulbs as soon as they burn out. Set up your furniture so you have a clear path. Avoid moving your furniture around. If any of your floors are uneven, fix them. If there are any pets around you, be aware of where they are. Review your medicines with your doctor. Some medicines can make you feel dizzy. This can increase your chance of falling. Ask your doctor what other things that you can do to help prevent falls. This information is not intended to replace advice given to you by your health care provider. Make sure you discuss any questions you have with your health care provider. Document Released: 08/22/2009 Document Revised: 04/02/2016 Document Reviewed: 11/30/2014 Elsevier Interactive Patient Education  2017 [employer].

## 2023-05-27 ENCOUNTER — Telehealth: Payer: Self-pay

## 2023-05-27 NOTE — Patient Outreach (Signed)
  Care Coordination   Follow Up Visit Note   05/27/2023 Name: DEANTAE SHACKLETON MRN: [id number] DOB: 03/31/1951  LUIAN SCHUMPERT is a 72 y.o. year old male who sees Joaquim Nam, MD for primary care. I spoke with  [name] by phone today.  What matters to the patients health and wellness today?  Medication assistance for [place]. Patient notified that practice pharmacist working on medication assistance for [place].    Goals Addressed               This Visit's Progress     Manage my Diabetes and heart failure (pt-stated)        Interventions Today    Flowsheet Row Most Recent Value  Chronic Disease   Chronic disease during today's visit Diabetes  General Interventions   General Interventions Discussed/Reviewed Communication with, General Interventions Reviewed  [collaboration with practice pharmacist regarding mediction assistance need for [place]. Patient called and notified that practice pharmacist working on [place] patient assistance.]                  SDOH assessments and interventions completed:  No     Care Coordination Interventions:  Yes, provided   Follow up plan: Follow up call scheduled for as previously scheduled    Encounter Outcome:  Pt. Visit Completed   George Ina RN,BSN,CCM [facility] Care Coordination [phone] direct line

## 2023-05-31 ENCOUNTER — Other Ambulatory Visit: Payer: Self-pay | Admitting: Family Medicine

## 2023-06-03 NOTE — Telephone Encounter (Signed)
RECEIVED PT PAGES OF application for FARXIGA to [employer] AND FAXED PROVIDER PAGES TO OFFICE OF [facility].   PLEASE BE ADVISED

## 2023-06-07 NOTE — Telephone Encounter (Signed)
Submitted application for FARXIGA to [employer] for patient assistance PROCESSING.   Phone: [phone]  PLEASE BE ADVISED

## 2023-06-10 NOTE — Telephone Encounter (Signed)
Received notification from [employer] regarding patient assistance PENDING STATUS DUE TO NEEDING PT 1040 TAX FORM  for FARXIGA.   PLEASE BE ADVISED I DID SPEAK TO PT AND HE WOULD BE MAILING FORMS TO ME.

## 2023-06-14 NOTE — Telephone Encounter (Signed)
RECEIVED PT INCOME DOCUMENTS AND RESUBMITTED TO [employer] FOR FARIXGA FOR RECONSIDERATION PROCESSING  PATIENTS ASSISTANCE.    PLEASE BE ADVISED

## 2023-06-18 NOTE — Telephone Encounter (Signed)
PAP: Patient has been denied for pt assistance by PAP Companies: [employer] due to patient above income threshold for program FOR FARXIGA  PLEASE BE ADVISED

## 2023-06-20 ENCOUNTER — Other Ambulatory Visit: Payer: Self-pay | Admitting: Family Medicine

## 2023-06-20 ENCOUNTER — Other Ambulatory Visit: Payer: Self-pay | Admitting: Internal Medicine

## 2023-06-22 NOTE — Telephone Encounter (Signed)
This is a Tim Mccullough pt 

## 2023-06-23 NOTE — Telephone Encounter (Signed)
Refill Request.  

## 2023-06-23 NOTE — Telephone Encounter (Signed)
Sent. Thanks.  Please verify that he is taking losartan 12.5mg  per day.  If not, then let me know.

## 2023-06-24 ENCOUNTER — Encounter: Payer: Self-pay | Admitting: Internal Medicine

## 2023-06-24 ENCOUNTER — Other Ambulatory Visit: Payer: Medicare HMO | Admitting: Pharmacist

## 2023-06-24 ENCOUNTER — Ambulatory Visit: Payer: Medicare HMO | Attending: Internal Medicine | Admitting: Internal Medicine

## 2023-06-24 VITALS — BP 108/66 | HR 101 | Ht 70.0 in | Wt 208.0 lb

## 2023-06-24 DIAGNOSIS — I5022 Chronic systolic (congestive) heart failure: Secondary | ICD-10-CM

## 2023-06-24 MED ORDER — LOSARTAN POTASSIUM 25 MG PO TABS: 12.5000 mg | ORAL_TABLET | Freq: Every day | ORAL | 1 refills | Status: DC

## 2023-06-24 NOTE — Progress Notes (Signed)
Care Coordination Call  Patient denied for [employer] assistance for [place]. Discussed [employer] application. Patient amenable. Assisted in completion of application today.  Enrolled 05/25/23-05/23/24; BIN: [id number], PCN: [id number]; Group: [id number]; ID: [id number]  Contacted [facility] and provided this information. They ran a claim with insurance as primary and [employer] grant as secondary.   Catie Eppie Gibson, PharmD, BCACP, CPP Clinical Pharmacist [facility] [phone]

## 2023-06-24 NOTE — Progress Notes (Signed)
Patient ID: Tim Mccullough, male   DOB: Mar 24, 1951, 72 y.o.   MRN: [id number]       Patient Care Team: Joaquim Nam, MD as PCP - General (Family Medicine) Iran Ouch, MD as PCP - Cardiology (Cardiology) Otho Ket, RN as [facility] Care Management   HPI  Tim Mccullough is a 72 y.o. male seen in  for wide complex beats in a pattern of bigeminy in the setting of nonischemic cardiomyopathy initially diagnosed in 2010.  These records are not available but the old chart reports ejection fraction of 35% and no obstructive coronary disease.  It also mentions a hemodynamically insignificant VSD.  Failed flecainide and amiodarone  5/21 underwent EP testing and attempted ablation of an intramural/septal focus.  PVCs could be suppressed but reemerged following cessation of energy.  He was started on amiodarone 6/21 Referred to Dr Berneice Gandy.  Ablation 10/21.    He has been treated with beta-blockers and Entresto, 2/2 cost >> losartan  The patient denies chest pain, shortness of breath, nocturnal dyspnea, orthopnea or peripheral edema.  There have been no palpitations, lightheadedness or syncope.  Complains of back pain but mostly fatigue.  This has been present for about a year.  New medications include Ozempic.Marland Kitchen     DATE TEST EF    5/13 Echo   50-55 %    1/21 Echo   30-35 %    2/21 LHC   Cors - normal  11/21 Echo  30-35%   8/22 Echo  50-55%     Date Cr K Mg Hgb TSH  2/21 1.15 5.1  13.8   3/21  1.32 4.7 1.9<<1.3     6/21     1.9  10/21 0.9 3.9 1.6<<1.4    4/22 1.03 4.5     6/23 1.07 4.3  13.4 0.48  4/24 0.89 4.4        Date PVCs  Multiple ECGS Bigeminy   8/21 3%      Past Medical History:  Diagnosis Date   Abnormal echocardiogram 12/2009   Repeated after med mgmt of cardiomyopathy showed EF 40-45%, mildly dilated LV, mild LV hypertrophy, anterolateral and apical hypokinesis, mild diastolic dysfunction.   Anemia    hx   Anxiety    Arthritis     Blood transfusion without reported diagnosis 09/2012   BPH (benign prostatic hypertrophy) 2004   Cancer [facility])    melanoma back   Cardiomyopathy    ? tachycardia-mediated   Cataract    Depression    Diabetes mellitus 11/2006   Type II   Diverticulosis    Echocardiogram abnormal 05/2009   Mild LVH with a mildly dilated LV.  EF was difficult to assess given poor acoustic windows but appeared moderately decreased.  Mild MR.  Probable small perimembranous VSD, mild LAE   Esophageal erosions    GERD (gastroesophageal reflux disease)    H/O cardiac radiofrequency ablation    Hip pain    History of MRI 05/2009   Cardiac MRI was done to followup difficult echo.  This showed mild to moderately dilated LV, mild to moderate LAE, EF  33% with global hypokinesis, normal RV size with mild systolic dysfunction.  There was no large VSD evident.  There was no myocardial delayed enhancement so no evidence for infiltrative disease or infarction.  LHC (7/10) showed only luminal irreg. in the coronaries and EF 35%   Holter monitor, abnormal 05/2009   Frequent PAC's and PVC's  Average HR 96  (but he had started Toprol XL).  HR range 71-130   Hyperlipidemia 04/1998   Hypertension 1995   Obesity    OSA (obstructive sleep apnea)    Not using CPAP- improved with weight loss   Pneumonia    HX OF pna   Sinus tachycardia    Pt says he has been told his heart rate is high since he was in high school.    Sleep apnea    has cpap; but does not use   VSD (ventricular septal defect)    Probable small perimembranous    Past Surgical History:  Procedure Laterality Date   CARDIAC CATHETERIZATION  05/30/2009   Dilated cardiomyopathy.  min VSD.  Hypokin Inf  wall.  Multiple min luminal Irr.  EF 35%   CATARACT EXTRACTION, BILATERAL     COLONOSCOPY     COLONOSCOPY W/ BIOPSIES  08/24/2006   Divertics, polyps x3, bx negative, repeat 2012 with mild diverticulosis in teh sigmoid colon and 1 polyp in tranverse colon,  repeat due 2017   ESOPHAGOGASTRODUODENOSCOPY  2012   1)Barrett's esoph, 2) Erosive esophagitis, 3) Mild gastritis in the antrum, 4) Duodenitis in the bulb of duodenum, 5) Small hiatal hernia   INGUINAL HERNIA REPAIR  1985   Left   INGUINAL HERNIA REPAIR  1999   Right   KNEE ARTHROSCOPY  10/15/2011   Procedure: ARTHROSCOPY KNEE;  Surgeon: Loreta Ave, MD;  Location:  SURGERY CENTER;  Service: Orthopedics;  Laterality: Right;  right knee scope with lateral meniscectomy, removal loose foreign body, and microfracture technique   LAMINECTOMY  1987   with discectomy   PVC ABLATION N/A 03/18/2020   Procedure: PVC ABLATION;  Surgeon: Marinus Maw, MD;  Location: [facility] INVASIVE CV LAB;  Service: Cardiovascular;  Laterality: N/A;   REVERSE SHOULDER ARTHROPLASTY Left 11/19/2021   Procedure: REVERSE SHOULDER ARTHROPLASTY;  Surgeon: Bjorn Pippin, MD;  Location: [facility] ORS;  Service: Orthopedics;  Laterality: Left;   RIGHT/LEFT HEART CATH AND CORONARY ANGIOGRAPHY N/A 12/25/2019   Procedure: RIGHT/LEFT HEART CATH AND CORONARY ANGIOGRAPHY;  Surgeon: Iran Ouch, MD;  Location: [facility] INVASIVE CV LAB;  Service: Cardiovascular;  Laterality: N/A;   ROTATOR CUFF REPAIR  12/2009   rt   TOTAL HIP ARTHROPLASTY  2011   Left   TOTAL KNEE ARTHROPLASTY  06/01/2012   Procedure: TOTAL KNEE ARTHROPLASTY;  Surgeon: Loreta Ave, MD;  Location: [facility] OR;  Service: Orthopedics;  Laterality: Right;   UPPER GASTROINTESTINAL ENDOSCOPY      Current Meds  Medication Sig   aspirin EC 81 MG tablet Take 81 mg by mouth daily. Swallow whole.   atorvastatin (LIPITOR) 40 MG tablet TAKE 1 TABLET BY MOUTH EVERY DAY   carvedilol (COREG) 12.5 MG tablet TAKE 1 TABLET BY MOUTH TWICE A DAY   doxazosin (CARDURA) 1 MG tablet TAKE 1 TABLET BY MOUTH EVERY DAY   esomeprazole (NEXIUM) 20 MG capsule Take 20 mg by mouth as needed.   fish oil-omega-3 fatty acids 1000 MG capsule Take 1 g by mouth 2 (two) times daily.     lansoprazole (PREVACID) 15 MG capsule Take 15 mg by mouth daily at 12 noon.   losartan (COZAAR) 25 MG tablet Take 0.5 tablets (12.5 mg total) by mouth daily.   metFORMIN (GLUCOPHAGE) 500 MG tablet TAKE 2 TABLETS (1,000 MG TOTAL) BY MOUTH 2 (TWO) TIMES DAILY WITH A MEAL.   omeprazole (PRILOSEC) 20 MG capsule Take 1 capsule (20 mg  total) by mouth daily.   ONETOUCH ULTRA test strip USE DAILY TO CHECK SUGAR. E11.9. ONE TOUCH TEST STRIPS.   OZEMPIC, 0.25 OR 0.5 MG/DOSE, 2 MG/3ML SOPN INJECT 0.25 MG INTO THE SKIN ONCE A WEEK. FOR 4 WEEKS   PARoxetine (PAXIL) 20 MG tablet TAKE 1 TABLET BY MOUTH EVERY DAY   spironolactone (ALDACTONE) 25 MG tablet TAKE 1/2 TABLET BY MOUTH DAILY    No Known Allergies  Review of Systems negative except from HPI and PMH  Physical Exam BP 108/66   Pulse (!) 101   Ht 5\' 10"  (1.778 m)   Wt 208 lb (94.3 kg)   SpO2 99%   BMI 29.84 kg/m  Well developed and well nourished in no acute distress HENT normal Neck supple with JVP-6-8 Clear  Regular rate and rhythm, no  gallop No  murmur Abd-soft with active BS No Clubbing cyanosis  edema Skin-warm and dry A & Oriented  Grossly normal sensory and motor function  ECG sinus at 101 with occasional PVCs 16/13/38    Assessment and  Plan Nonischemic cardiomyopathy  HFrecEF   Bigeminal wide-complex beat,  relatively narrow with a prolonged intrinsicoid deflection and a fused appearance suggestive of a junctional trigger capturing the intrinsic conduction system.  Ablation GT 5/21; PH 10/21  High Risk Medication Surveillance  Aldactone  Sleep disordered breathing/sleep apnea   Diabetes   Hypertension  Orthostatic hypotension  Fatigue  Sinus tachycardia-relative    Blood pressure is well-controlled without significant orthostasis on the combination of spironolactone losartan and carvedilol.  Tolerating also his Cardura  Palpitations are quiet, no recurrent PVCs following ablation which he is  aware  Euvolemic.  Will continue new on him spironolactone  Fatigue is new.  Quick literature search demonstrates a in association with Ozempic, up to 10%.  Recommendations included volume repletion as inadvertent dehydration can be a cause.  With his diabetes and his elevated hemoglobin A1c, will try him on G2, not a great choice as sodium and glucose Co. transport is not facilitated  His tachycardia is also somewhat concerning.  Looking back over the last couple years he has had some episodes where it is at this range but mostly has been in the 90s and a few years ago was lower than that.  We will recheck his CBC; there is also an interesting decreasing delta w TSH last checked about a year ago.  Will recheck this

## 2023-06-24 NOTE — Patient Instructions (Signed)
Medication Instructions:  The current medical regimen is effective;  continue present plan and medications.  *If you need a refill on your cardiac medications before your next appointment, please call your pharmacy*   Lab Work: TSH, CBC today   If you have labs (blood work) drawn today and your tests are completely normal, you will receive your results only by: MyChart Message (if you have MyChart) OR A paper copy in the mail If you have any lab test that is abnormal or we need to change your treatment, we will call you to review the results.   Follow-Up: At [facility], you and your health needs are our priority.  As part of our continuing mission to provide you with exceptional heart care, we have created designated Provider Care Teams.  These Care Teams include your primary Cardiologist (physician) and Advanced Practice Providers (APPs -  Physician Assistants and Nurse Practitioners) who all work together to provide you with the care you need, when you need it.  We recommend signing up for the patient portal called "MyChart".  Sign up information is provided on this After Visit Summary.  MyChart is used to connect with patients for Virtual Visits (Telemedicine).  Patients are able to view lab/test results, encounter notes, upcoming appointments, etc.  Non-urgent messages can be sent to your provider as well.   To learn more about what you can do with MyChart, go to [email or web address].    Your next appointment:   12 month(s)  Provider:   Sherryl Manges, MD

## 2023-06-25 LAB — CBC
Hematocrit: 39.5 % (ref 37.5–51.0)
Hemoglobin: 13.1 g/dL (ref 13.0–17.7)
MCH: 30.3 pg (ref 26.6–33.0)
MCHC: 33.2 g/dL (ref 31.5–35.7)
MCV: 91 fL (ref 79–97)
Platelets: 249 10*3/uL (ref 150–450)
RBC: 4.33 x10E6/uL (ref 4.14–5.80)
RDW: 13.4 % (ref 11.6–15.4)
WBC: 7.7 10*3/uL (ref 3.4–10.8)

## 2023-06-25 LAB — TSH: TSH: 0.908 u[IU]/mL (ref 0.450–4.500)

## 2023-06-30 ENCOUNTER — Ambulatory Visit: Payer: Self-pay

## 2023-06-30 NOTE — Patient Outreach (Signed)
  Care Coordination   Follow Up Visit Note   06/30/2023 Name: Tim Mccullough MRN: [id number] DOB: January 01, 1951  Tim Mccullough is a 72 y.o. year old male who sees Joaquim Nam, MD for primary care. I spoke with  Tim Mccullough by phone today.  What matters to the patients health and wellness today?  Patient states he is doing well.  He reports receiving his [place].  Patient states his blood sugars range from 120 to 205.  Denies blood sugars <70. Patient reports fasting blood sugar on yesterday was 140.  Patient reports pulse ranging 95-100.  He states most recent BP was 120/65. He reports occasional lightheadedness if he stands up to quickly.  Denies falls. Patient reports having follow up visit with cardiologist on 06/24/23.  Next follow up x 1 year.  No further provider follow up visits noted through 10/2023.    Goals Addressed               This Visit's Progress     Manage my Diabetes and heart failure (pt-stated)        Interventions Today    Flowsheet Row Most Recent Value  Chronic Disease   Chronic disease during today's visit Diabetes, Congestive Heart Failure (CHF), Other  [orthostatic hypertension, tachycardia]  General Interventions   General Interventions Discussed/Reviewed General Interventions Reviewed, Labs, Doctor Visits  [evaluation of current treatment plan for DM, HF, orthostatic hypertension and patiens adherence to plan as established by provider.  Assessed for blood sugar readings, HF and hypotension symptoms.]  Labs --  [Reviewed/discussed most recent CBC/ TSH from 06/24/23]  Doctor Visits Discussed/Reviewed Doctor Visits Reviewed  [facility] upcoming provider visits. Advised to keep provider follow up visits as recommended. Discussed cardiology 06/24/23 visit.  Assessed for changes to treatment plan]  Education Interventions   Education Provided Provided Education  [Reviewed heart failure symptoms and action plan. Advised to notify provider for symptoms. Advised  to monitor BP periodically and monitor pulse rate notifying provider for readings outside of established parameters.]  Provided Verbal Education On Blood Sugar Monitoring  [advised to continue monitoring blood sugars as recommended by provider.  Assessed for low blood sugars.  Advised to stay hydrated. Discussed dehydration can cause increase heart rate.]  Pharmacy Interventions   Pharmacy Dicussed/Reviewed Pharmacy Topics Reviewed  [medications reviewed.  Confirmed patient received [place] assistance.  Advised to take medications as prescribed.]                  SDOH assessments and interventions completed:  No     Care Coordination Interventions:  Yes, provided   Follow up plan: Follow up call scheduled for 08/31/23    Encounter Outcome:  Pt. Visit Completed   George Ina RN,BSN,CCM [facility] Care Coordination [phone] direct line

## 2023-06-30 NOTE — Patient Instructions (Signed)
Visit Information  Thank you for taking time to visit with me today. Please don't hesitate to contact me if I can be of assistance to you.   Following are the goals we discussed today:   Goals Addressed               This Visit's Progress     Manage my Diabetes and heart failure (pt-stated)        Interventions Today    Flowsheet Row Most Recent Value  Chronic Disease   Chronic disease during today's visit Diabetes, Congestive Heart Failure (CHF), Other  [orthostatic hypertension, tachycardia]  General Interventions   General Interventions Discussed/Reviewed General Interventions Reviewed, Labs, Doctor Visits  [evaluation of current treatment plan for DM, HF, orthostatic hypertension and patiens adherence to plan as established by provider.  Assessed for blood sugar readings, HF and hypotension symptoms.]  Labs --  [Reviewed/discussed most recent CBC/ TSH from 06/24/23]  Doctor Visits Discussed/Reviewed Doctor Visits Reviewed  [facility] upcoming provider visits. Advised to keep provider follow up visits as recommended. Discussed cardiology 06/24/23 visit.  Assessed for changes to treatment plan]  Education Interventions   Education Provided Provided Education  [Reviewed heart failure symptoms and action plan. Advised to notify provider for symptoms. Advised to monitor BP periodically and monitor pulse rate notifying provider for readings outside of established parameters.]  Provided Verbal Education On Blood Sugar Monitoring  [advised to continue monitoring blood sugars as recommended by provider.  Assessed for low blood sugars.  Advised to stay hydrated. Discussed dehydration can cause increase heart rate.]  Pharmacy Interventions   Pharmacy Dicussed/Reviewed Pharmacy Topics Reviewed  [medications reviewed.  Confirmed patient received [place] assistance.  Advised to take medications as prescribed.]                  Our next appointment is by telephone on 08/31/23 at 11  am  Please call the care guide team at [phone] if you need to cancel or reschedule your appointment.   If you are experiencing a Mental Health or Behavioral Health Crisis or need someone to talk to, please call the Suicide and Crisis Lifeline: 988 call 1-800-273-TALK (toll free, 24 hour hotline)  Patient verbalizes understanding of instructions and care plan provided today and agrees to view in MyChart. Active MyChart status and patient understanding of how to access instructions and care plan via MyChart confirmed with patient.     George Ina RN,BSN,CCM [facility] Care Coordination [phone] direct line

## 2023-08-31 ENCOUNTER — Ambulatory Visit: Payer: Self-pay

## 2023-08-31 NOTE — Patient Instructions (Signed)
Visit Information  Thank you for taking time to visit with me today. Please don't hesitate to contact me if I can be of assistance to you.   Following are the goals we discussed today:   Goals Addressed               This Visit's Progress     Manage my Diabetes and heart failure and over all health (pt-stated)        Interventions Today    Flowsheet Row Most Recent Value  Chronic Disease   Chronic disease during today's visit Diabetes, Congestive Heart Failure (CHF), Other  [orthostatic hypertension, tachycardia]  General Interventions   General Interventions Discussed/Reviewed General Interventions Reviewed, Doctor Visits  [evaluation of current treatment plan for mentioned health conditions and patients adherence to plan as established by provider. Assessed for HF symptoms, BP and BS readings and pulse rate]  Doctor Visits Discussed/Reviewed Doctor Visits Reviewed  Algis Downs to keep follow up appointmnent with providers.]  Exercise Interventions   Exercise Discussed/Reviewed Physical Activity  [assessed patients physical activity level.]  Education Interventions   Education Provided Provided Education  [Advised to continue monitoring BP, BS, and pulse readings reporting readings that are outside of established parameters. Advised to contact provider for any new / ongoing symptoms.]  Provided Verbal Education On Blood Sugar Monitoring  Pharmacy Interventions   Pharmacy Dicussed/Reviewed Pharmacy Topics Reviewed  Algis Downs to take medication as prescribed.]                  Our next appointment is by telephone on 11/24/23 at 11 am  Please call the care guide team at [phone] if you need to cancel or reschedule your appointment.   If you are experiencing a Mental Health or Behavioral Health Crisis or need someone to talk to, please call the Suicide and Crisis Lifeline: 988 call 1-800-273-TALK (toll free, 24 hour hotline)  Patient verbalizes understanding of  instructions and care plan provided today and agrees to view in MyChart. Active MyChart status and patient understanding of how to access instructions and care plan via MyChart confirmed with patient.     George Ina RN,BSN,CCM [facility] Care Coordination [phone] direct line

## 2023-08-31 NOTE — Patient Outreach (Signed)
Care Coordination   Follow Up Visit Note   08/31/2023 Name: COLVIN RAYHILL MRN: [id number] DOB: 04-08-1951  MICKENZIE SCHNEIDER is a 72 y.o. year old male who sees Joaquim Nam, MD for primary care. I spoke with  [name] by phone today.  What matters to the patients health and wellness today?  Patient states he is doing well.  Denies any heart failure symptoms.  He states he continues to monitor his blood pressure daily.  He reports BP readings of 120-130/60-70's.  Patient states he has slipped a little on his diet but is working on being more mindful.  He reports BS readings range from 100-200. Denies having BS < 70.   Patient reports his pulse rate continues to range in the 90's to low 100.     Goals Addressed               This Visit's Progress     Manage my Diabetes and heart failure and over all health (pt-stated)        Interventions Today    Flowsheet Row Most Recent Value  Chronic Disease   Chronic disease during today's visit Diabetes, Congestive Heart Failure (CHF), Other  [orthostatic hypertension, tachycardia]  General Interventions   General Interventions Discussed/Reviewed General Interventions Reviewed, Doctor Visits  [evaluation of current treatment plan for mentioned health conditions and patients adherence to plan as established by provider. Assessed for HF symptoms, BP and BS readings and pulse rate]  Doctor Visits Discussed/Reviewed Doctor Visits Reviewed  Algis Downs to keep follow up appointmnent with providers.]  Exercise Interventions   Exercise Discussed/Reviewed Physical Activity  [assessed patients physical activity level.]  Education Interventions   Education Provided Provided Education  [Advised to continue monitoring BP, BS, and pulse readings reporting readings that are outside of established parameters. Advised to contact provider for any new / ongoing symptoms.]  Provided Verbal Education On Blood Sugar Monitoring  Pharmacy Interventions    Pharmacy Dicussed/Reviewed Pharmacy Topics Reviewed  Algis Downs to take medication as prescribed.]                  SDOH assessments and interventions completed:  No     Care Coordination Interventions:  Yes, provided   Follow up plan: Follow up call scheduled for 11/24/23    Encounter Outcome:  Patient Visit Completed   George Ina RN,BSN,CCM [facility] Care Coordination [phone] direct line

## 2023-09-22 ENCOUNTER — Other Ambulatory Visit: Payer: Self-pay | Admitting: Family Medicine

## 2023-09-22 NOTE — Telephone Encounter (Signed)
Please verify current dose and let me know.    Please schedule yearly visit.  Thanks.

## 2023-09-22 NOTE — Telephone Encounter (Signed)
last filled: 05/03/2023 ozempic 3ml with 3 refills last office visit: 02/19/2023 next office visit: nothing scheduled

## 2023-09-23 NOTE — Telephone Encounter (Signed)
Patients dose: injecting 0.25 MG into the skin once a week for 4 weeks. He has been scheduled to be seen on 09/28/23

## 2023-09-24 NOTE — Telephone Encounter (Signed)
Sent. Thanks.   

## 2023-09-28 ENCOUNTER — Ambulatory Visit (INDEPENDENT_AMBULATORY_CARE_PROVIDER_SITE_OTHER): Payer: Medicare HMO | Admitting: Family Medicine

## 2023-09-28 ENCOUNTER — Telehealth: Payer: Self-pay

## 2023-09-28 ENCOUNTER — Encounter: Payer: Self-pay | Admitting: Family Medicine

## 2023-09-28 VITALS — BP 118/68 | HR 100 | Temp 98.4°F | Ht 70.0 in | Wt 206.4 lb

## 2023-09-28 DIAGNOSIS — G5600 Carpal tunnel syndrome, unspecified upper limb: Secondary | ICD-10-CM | POA: Diagnosis not present

## 2023-09-28 DIAGNOSIS — Z0000 Encounter for general adult medical examination without abnormal findings: Secondary | ICD-10-CM

## 2023-09-28 DIAGNOSIS — I10 Essential (primary) hypertension: Secondary | ICD-10-CM

## 2023-09-28 DIAGNOSIS — E119 Type 2 diabetes mellitus without complications: Secondary | ICD-10-CM | POA: Diagnosis not present

## 2023-09-28 DIAGNOSIS — F341 Dysthymic disorder: Secondary | ICD-10-CM

## 2023-09-28 DIAGNOSIS — E785 Hyperlipidemia, unspecified: Secondary | ICD-10-CM

## 2023-09-28 DIAGNOSIS — Z7984 Long term (current) use of oral hypoglycemic drugs: Secondary | ICD-10-CM

## 2023-09-28 DIAGNOSIS — Z7189 Other specified counseling: Secondary | ICD-10-CM

## 2023-09-28 DIAGNOSIS — Z7985 Long-term (current) use of injectable non-insulin antidiabetic drugs: Secondary | ICD-10-CM

## 2023-09-28 LAB — COMPREHENSIVE METABOLIC PANEL
ALT: 16 U/L (ref 0–53)
AST: 18 U/L (ref 0–37)
Albumin: 4.3 g/dL (ref 3.5–5.2)
Alkaline Phosphatase: 54 U/L (ref 39–117)
BUN: 23 mg/dL (ref 6–23)
CO2: 27 meq/L (ref 19–32)
Calcium: 9 mg/dL (ref 8.4–10.5)
Chloride: 102 meq/L (ref 96–112)
Creatinine, Ser: 0.94 mg/dL (ref 0.40–1.50)
GFR: 81.42 mL/min (ref >60.00)
Glucose, Bld: 147 mg/dL — ABNORMAL HIGH (ref 70–99)
Potassium: 4.3 meq/L (ref 3.5–5.1)
Sodium: 139 meq/L (ref 135–145)
Total Bilirubin: 0.6 mg/dL (ref 0.2–1.2)
Total Protein: 6.9 g/dL (ref 6.0–8.3)

## 2023-09-28 LAB — LIPID PANEL
Cholesterol: 122 mg/dL (ref 0–200)
HDL: 40 mg/dL (ref >39.00)
LDL Cholesterol: 51 mg/dL (ref 0–99)
NonHDL: 81.65
Total CHOL/HDL Ratio: 3
Triglycerides: 153 mg/dL — ABNORMAL HIGH (ref 0.0–149.0)
VLDL: 30.6 mg/dL (ref 0.0–40.0)

## 2023-09-28 LAB — POCT GLYCOSYLATED HEMOGLOBIN (HGB A1C): Hemoglobin A1C: 7.4 % — AB (ref 4.0–5.6)

## 2023-09-28 LAB — MICROALBUMIN / CREATININE URINE RATIO
Creatinine,U: 67.6 mg/dL
Microalb Creat Ratio: 1.8 mg/g (ref 0.0–30.0)
Microalb, Ur: 1.2 mg/dL (ref 0.0–1.9)

## 2023-09-28 MED ORDER — SPIRONOLACTONE 25 MG PO TABS: ORAL_TABLET | ORAL | 3 refills | Status: DC

## 2023-09-28 NOTE — Patient Instructions (Addendum)
If bracing your right wrist at night doesn't help, then ask about seeing Dr. Patsy Lager.   Go to the lab on the way out.   If you have mychart we'll likely use that to update you.    Take care.  Glad to see you. Recheck A1c at a visit in about 6 months.

## 2023-09-28 NOTE — Progress Notes (Unsigned)
Diabetes:  Using medications without difficulties:yes Hypoglycemic episodes:no Hyperglycemic episodes:no Feet problems:no Blood Sugars averaging: usually ~130-190s eye exam within last year: done at [facility], d/w pt about follow up.   A1c 7.4, d/w pt at OV.  Farxiga 5mg  a day, 1000mg  metformin BID, 0.25mg  ozempic weekly.    Elevated Cholesterol: Using medications without problems: yes Muscle aches: likely not from statin.  Diet compliance: d/w pt.   Exercise: d/w pt.  Encouraged.   Labs pending.   He has dermatology f/u yearly.    Mood d/w pt.  Mood is "alright."  No SI/HI.  Anxiety is clearly better on med.    Hand paresthesia.  Numbness and burning.  Worse at night.  R hand.  No weakness.  No L hand sx except for rare events.   Hasn't tried splinting.  No sx at OV.  No neck pain.  Tinel positive at the R wrist but not the L.  Noted from chart "H/o abnormal electrodiagnostic exam consistent with 1) Right moderate (grade III) carpal tunnel syndrome (median nerve entrapment at wrist). 2) Left mild (grade II) carpal tunnel syndrome (median nerve entrapment at wrist)."  Vaccines d/w pt.  Advance directive- daughter designated if patient were incapacitated.  Opted in for PSA testing 2024.  Colonoscopy 2017  Hypertension:    Using medication without problems or lightheadedness: yes Chest pain with exertion:no Edema:no Short of breath:no Labs pending.   PMH and SH reviewed  Meds, vitals, and allergies reviewed.   ROS: Per HPI unless specifically indicated in ROS section   GEN: nad, alert and oriented HEENT: mucous membranes moist NECK: supple w/o LA CV: rrr. PULM: ctab, no inc wob ABD: soft, +bs EXT: no edema SKIN: no acute rash Normal grip bilaterally but Tinel positive right wrist.  Diabetic foot exam: Normal inspection No skin breakdown No calluses  Normal DP pulses Normal sensation to light touch and monofilament Nails normal

## 2023-09-28 NOTE — Telephone Encounter (Signed)
-----   Message from Crawford Givens sent at 09/28/2023  9:38 AM EST ----- Please request eye exam from [facility] [address] in [place].  Thanks.

## 2023-09-28 NOTE — Telephone Encounter (Signed)
Office added to care team. Request sent to office.

## 2023-09-29 DIAGNOSIS — G5600 Carpal tunnel syndrome, unspecified upper limb: Secondary | ICD-10-CM | POA: Insufficient documentation

## 2023-09-29 NOTE — Assessment & Plan Note (Signed)
Discussed options.  If bracing right wrist at night doesn't help, then ask about seeing Dr. Patsy Lager.   He can get an over-the-counter carpal tunnel brace.  Discussed.  He agrees.

## 2023-09-29 NOTE — Assessment & Plan Note (Signed)
See notes on labs.  Continue carvedilol doxazosin losartan spironolactone.

## 2023-09-29 NOTE — Assessment & Plan Note (Signed)
A1c 7.4, d/w pt at OV.  Farxiga 5mg  a day, 1000mg  metformin BID, 0.25mg  ozempic weekly.  No change in medications.  Continue work on diet.  Recheck periodically.  See notes on other labs.

## 2023-09-29 NOTE — Assessment & Plan Note (Signed)
Mood is "alright."  No SI/HI.  Anxiety is clearly better on med.   Would continue Paxil as is.

## 2023-09-29 NOTE — Assessment & Plan Note (Signed)
  Vaccines d/w pt.  Advance directive- daughter designated if patient were incapacitated.  Opted in for PSA testing 2024.  Colonoscopy 2017

## 2023-09-29 NOTE — Assessment & Plan Note (Signed)
Advance directive- daughter designated if patient were incapacitated.   

## 2023-09-29 NOTE — Assessment & Plan Note (Signed)
Continue atorvastatin.  See notes on labs. 

## 2023-11-24 ENCOUNTER — Ambulatory Visit: Payer: Self-pay

## 2023-11-24 NOTE — Patient Outreach (Signed)
 Care Coordination   Follow Up Visit Note   11/24/2023 Name: Tim Mccullough MRN: [id number] DOB: 1951/11/03  Tim Mccullough is a 73 y.o. year old male who sees Tim Galea, MD for primary care. I spoke with  Tim Mccullough by phone today.  What matters to the patients health and wellness today?  Patient states he is doing well. Denies any heart failure or increase heart rate symptoms.  Patient states his most recent Hgb A1c was 7.4 down from 8.0.  He state his blood sugars range from 120-190.  Patient states his blood sugars are maintaining on his current diabetic medication regimen.  Denies any blood sugars <70.  He states he has to treat his blood sugar on occasion if it falls under 100 because he has hypoglycemic symptoms.    Patient reports his blood pressure ranges from 115-120's/ 60-70's and pulse rate ranges 90-100.    Patient states he received his flu shot.  Patient agreeable that care coordination goals have been met.    Goals Addressed               This Visit's Progress     Manage my Diabetes and heart failure and over all health (pt-stated)        Interventions Today    Flowsheet Row Most Recent Value  Chronic Disease   Chronic disease during today's visit Diabetes, Congestive Heart Failure (CHF), Other  [orthostatic HTN and tachycardia]  General Interventions   General Interventions Discussed/Reviewed General Interventions Reviewed, Labs, Doctor Visits  [evaluation of current treatment plan for listed health conditions and patients adherence to plan as established by provider. Assessed blood sugar, blood pressure/ pulse readings. Assessed for HF symptoms.]  Labs Hgb A1c every 3 months  [Reviewed most recent Hgb A1c. Congratulated patient that Hgb A1c is moving towards goal.]  Doctor Visits Discussed/Reviewed Doctor Visits Reviewed  Tim Mccullough upcoming provider visits. Advised to keep follow up visits with providers. Advised to contact primary care provider if care  coordination services needed in the future.]  Exercise Interventions   Exercise Discussed/Reviewed Physical Activity  [Encouraged patient to consider incorporating physical activity / exercise into daily regimen.  Gave example to do laps 2-3 times per day inside home if to cold to go outside. Advised that any increase in movement/ activity will help.]  Education Interventions   Education Provided Provided Education  [Advised to continue monitoring BP and pulse rate daily. Advised to notify provider of readings outside of established  parameters. Reviewed HF symptoms. Advised to notify provider for increase symptoms.]  Provided Verbal Education On Blood Sugar Monitoring  [encouraged ongoing monitoring of blood sugars daily.  Advised to notify provider for frequent blood sugars < 70 or >250.  Reviewed hypoglycemic treatment.]  Nutrition Interventions   Nutrition Discussed/Reviewed Nutrition Reviewed, Carbohydrate meal planning, Adding fruits and vegetables, Decreasing sugar intake, Portion sizes  [discussed importance of decreasing sugar intake and following a more low carbohydrate diet.  Discussed following plate method for diet control with 1/2 of the plate being vegetables, 1/4 lean protein and 1/4 carbohydrate.]  Pharmacy Interventions   Pharmacy Dicussed/Reviewed Pharmacy Topics Reviewed  [medications reviewed and compliance discussed and advised.]              SDOH assessments and interventions completed:  No     Care Coordination Interventions:  Yes, provided   Follow up plan: No further intervention required.   Encounter Outcome:  Patient Visit Completed   Tim Albano RN,BSN,CCM  [facility]  [facility], [facility] coordinator / Case Manager Phone: [phone]

## 2023-11-24 NOTE — Patient Instructions (Signed)
 Visit Information  Thank you for taking time to visit with me today. Your care coordination goals have been met.   What we discussed today:   Goals Addressed               This Visit's Progress     Manage my Diabetes and heart failure and over all health (pt-stated)        Interventions Today    Flowsheet Row Most Recent Value  Chronic Disease   Chronic disease during today's visit Diabetes, Congestive Heart Failure (CHF), Other  [orthostatic HTN and tachycardia]  General Interventions   General Interventions Discussed/Reviewed General Interventions Reviewed, Labs, Doctor Visits  [evaluation of current treatment plan for listed health conditions and patients adherence to plan as established by provider. Assessed blood sugar, blood pressure/ pulse readings. Assessed for HF symptoms.]  Labs Hgb A1c every 3 months  [Reviewed most recent Hgb A1c. Congratulated patient that Hgb A1c is moving towards goal.]  Doctor Visits Discussed/Reviewed Doctor Visits Reviewed  Carin [place] upcoming provider visits. Advised to keep follow up visits with providers. Advised to contact primary care provider if care coordination services needed in the future.]  Exercise Interventions   Exercise Discussed/Reviewed Physical Activity  [Encouraged patient to consider incorporating physical activity / exercise into daily regimen.  Gave example to do laps 2-3 times per day inside home if to cold to go outside. Advised that any increase in movement/ activity will help.]  Education Interventions   Education Provided Provided Education  [Advised to continue monitoring BP and pulse rate daily. Advised to notify provider of readings outside of established  parameters. Reviewed HF symptoms. Advised to notify provider for increase symptoms.]  Provided Verbal Education On Blood Sugar Monitoring  [encouraged ongoing monitoring of blood sugars daily.  Advised to notify provider for frequent blood sugars < 70 or >250.  Reviewed  hypoglycemic treatment.]  Nutrition Interventions   Nutrition Discussed/Reviewed Nutrition Reviewed, Carbohydrate meal planning, Adding fruits and vegetables, Decreasing sugar intake, Portion sizes  [discussed importance of decreasing sugar intake and following a more low carbohydrate diet.  Discussed following plate method for diet control with 1/2 of the plate being vegetables, 1/4 lean protein and 1/4 carbohydrate.]  Pharmacy Interventions   Pharmacy Dicussed/Reviewed Pharmacy Topics Reviewed  [medications reviewed and compliance discussed and advised.]              Please contact your primary care provider if care coordination/ case management services are needed in the future.   If you are experiencing a Mental Health or Behavioral Health Crisis or need someone to talk to, please call the Suicide and Crisis Lifeline: 988 call 1-800-273-TALK (toll free, 24 hour hotline)  Patient verbalizes understanding of instructions and care plan provided today and agrees to view in MyChart. Active MyChart status and patient understanding of how to access instructions and care plan via MyChart confirmed with patient.     Verba Girt RN,BSN,CCM [place]  [facility], [facility] coordinator / Case Manager Phone: [phone]

## 2023-11-25 ENCOUNTER — Other Ambulatory Visit: Payer: Self-pay | Admitting: Family Medicine

## 2023-12-16 ENCOUNTER — Other Ambulatory Visit: Payer: Self-pay | Admitting: Family Medicine

## 2023-12-16 NOTE — Telephone Encounter (Signed)
 Last office Visit: 09/28/23 Next office visit: 03/27/24 Last refill date:  PAROXETINE  HCL 20 MG TABLET 02/01/23 90 tablets 3 refills

## 2024-02-06 ENCOUNTER — Other Ambulatory Visit: Payer: Self-pay | Admitting: Family Medicine

## 2024-03-21 DIAGNOSIS — L570 Actinic keratosis: Secondary | ICD-10-CM | POA: Diagnosis not present

## 2024-03-21 DIAGNOSIS — Z86006 Personal history of melanoma in-situ: Secondary | ICD-10-CM | POA: Diagnosis not present

## 2024-03-21 DIAGNOSIS — D2262 Melanocytic nevi of left upper limb, including shoulder: Secondary | ICD-10-CM | POA: Diagnosis not present

## 2024-03-21 DIAGNOSIS — Z85820 Personal history of malignant melanoma of skin: Secondary | ICD-10-CM | POA: Diagnosis not present

## 2024-03-21 DIAGNOSIS — Z85828 Personal history of other malignant neoplasm of skin: Secondary | ICD-10-CM | POA: Diagnosis not present

## 2024-03-21 DIAGNOSIS — L538 Other specified erythematous conditions: Secondary | ICD-10-CM | POA: Diagnosis not present

## 2024-03-21 DIAGNOSIS — L2989 Other pruritus: Secondary | ICD-10-CM | POA: Diagnosis not present

## 2024-03-21 DIAGNOSIS — D2272 Melanocytic nevi of left lower limb, including hip: Secondary | ICD-10-CM | POA: Diagnosis not present

## 2024-03-21 DIAGNOSIS — D225 Melanocytic nevi of trunk: Secondary | ICD-10-CM | POA: Diagnosis not present

## 2024-03-21 DIAGNOSIS — D2261 Melanocytic nevi of right upper limb, including shoulder: Secondary | ICD-10-CM | POA: Diagnosis not present

## 2024-03-21 DIAGNOSIS — B078 Other viral warts: Secondary | ICD-10-CM | POA: Diagnosis not present

## 2024-03-27 ENCOUNTER — Ambulatory Visit: Payer: Medicare HMO | Admitting: Family Medicine

## 2024-03-30 ENCOUNTER — Ambulatory Visit (INDEPENDENT_AMBULATORY_CARE_PROVIDER_SITE_OTHER): Admitting: Family Medicine

## 2024-03-30 ENCOUNTER — Encounter: Payer: Self-pay | Admitting: Family Medicine

## 2024-03-30 VITALS — BP 118/72 | HR 94 | Temp 98.5°F | Ht 70.0 in | Wt 202.8 lb

## 2024-03-30 DIAGNOSIS — K439 Ventral hernia without obstruction or gangrene: Secondary | ICD-10-CM | POA: Diagnosis not present

## 2024-03-30 DIAGNOSIS — R5383 Other fatigue: Secondary | ICD-10-CM

## 2024-03-30 DIAGNOSIS — E119 Type 2 diabetes mellitus without complications: Secondary | ICD-10-CM

## 2024-03-30 DIAGNOSIS — Z7985 Long-term (current) use of injectable non-insulin antidiabetic drugs: Secondary | ICD-10-CM

## 2024-03-30 LAB — POCT GLYCOSYLATED HEMOGLOBIN (HGB A1C): Hemoglobin A1C: 6.8 % — AB (ref 4.0–5.6)

## 2024-03-30 NOTE — Patient Instructions (Addendum)
 Go to the lab on the way out.   If you have mychart we'll likely use that to update you.     If your labs are fine, then I would try skipping doxazosin .   If you aren't better or if you are having trouble urinating, then let me know.  Likely a hernia, update me if more pain or larger.   Recheck in about 6 months, labs ahead of time if possible.   Take care.  Glad to see you.

## 2024-03-30 NOTE — Progress Notes (Unsigned)
 Diabetes:  Using medications without difficulties:yes Hypoglycemic episodes: no Hyperglycemic episodes: not with lower dose of  Feet problems: no Blood Sugars averaging: see below.   eye exam within last year: due this year, d/w pt.  [facility].   A1c d/w pt at OV.  6.8.   He is taking ozempic  0.5mg .  sugar now 150 or lower usually.    Fatigue noted, unclear if med related.  D/w pt.    He had some dizziness a few months ago.  It resolved and didn't come back after a few episodes.  He could wake up with sx, before getting out of bed.   He is still dealing with tinnitus, longstanding, predates the above.     Long standing mass noted in the L lower abd, noted >10 years per patient report.  Sometimes with variable size. Not painful.  Likely hernia on exam.   Meds, vitals, and allergies reviewed.   ROS: Per HPI unless specifically indicated in ROS section   GEN: nad, alert and oriented HEENT: ncat NECK: supple w/o LA CV: rrr. PULM: ctab, no inc wob ABD: soft, +bs EXT: no edema SKIN: no acute rash

## 2024-03-31 LAB — COMPREHENSIVE METABOLIC PANEL WITH GFR
ALT: 18 U/L (ref 0–53)
AST: 19 U/L (ref 0–37)
Albumin: 4.3 g/dL (ref 3.5–5.2)
Alkaline Phosphatase: 56 U/L (ref 39–117)
BUN: 14 mg/dL (ref 6–23)
CO2: 30 meq/L (ref 19–32)
Calcium: 8.8 mg/dL (ref 8.4–10.5)
Chloride: 102 meq/L (ref 96–112)
Creatinine, Ser: 0.78 mg/dL (ref 0.40–1.50)
GFR: 89.26 mL/min (ref >60.00)
Glucose, Bld: 118 mg/dL — ABNORMAL HIGH (ref 70–99)
Potassium: 4.3 meq/L (ref 3.5–5.1)
Sodium: 141 meq/L (ref 135–145)
Total Bilirubin: 0.5 mg/dL (ref 0.2–1.2)
Total Protein: 6.8 g/dL (ref 6.0–8.3)

## 2024-03-31 LAB — CBC WITH DIFFERENTIAL/PLATELET
Basophils Absolute: 0 10*3/uL (ref 0.0–0.1)
Basophils Relative: 0.9 % (ref 0.0–3.0)
Eosinophils Absolute: 0.1 10*3/uL (ref 0.0–0.7)
Eosinophils Relative: 2.3 % (ref 0.0–5.0)
HCT: 39.7 % (ref 39.0–52.0)
Hemoglobin: 13.2 g/dL (ref 13.0–17.0)
Lymphocytes Relative: 44.5 % (ref 12.0–46.0)
Lymphs Abs: 2.3 10*3/uL (ref 0.7–4.0)
MCHC: 33.3 g/dL (ref 30.0–36.0)
MCV: 86.8 fl (ref 78.0–100.0)
Monocytes Absolute: 0.5 10*3/uL (ref 0.1–1.0)
Monocytes Relative: 9.6 % (ref 3.0–12.0)
Neutro Abs: 2.2 10*3/uL (ref 1.4–7.7)
Neutrophils Relative %: 42.7 % — ABNORMAL LOW (ref 43.0–77.0)
Platelets: 267 10*3/uL (ref 150.0–400.0)
RBC: 4.58 Mil/uL (ref 4.22–5.81)
RDW: 14.6 % (ref 11.5–15.5)
WBC: 5.2 10*3/uL (ref 4.0–10.5)

## 2024-03-31 LAB — TSH: TSH: 0.61 u[IU]/mL (ref 0.35–5.50)

## 2024-03-31 LAB — VITAMIN B12: Vitamin B-12: 238 pg/mL (ref 211–911)

## 2024-04-01 ENCOUNTER — Other Ambulatory Visit: Payer: Self-pay | Admitting: Family Medicine

## 2024-04-03 ENCOUNTER — Other Ambulatory Visit: Payer: Self-pay | Admitting: Family Medicine

## 2024-04-03 ENCOUNTER — Ambulatory Visit: Payer: Self-pay | Admitting: Family Medicine

## 2024-04-03 DIAGNOSIS — R5383 Other fatigue: Secondary | ICD-10-CM | POA: Insufficient documentation

## 2024-04-03 NOTE — Assessment & Plan Note (Signed)
 See notes on labs.  If his abs are fine, then I would try skipping doxazosin .   If not better or if having trouble urinating, then let me know.

## 2024-04-03 NOTE — Assessment & Plan Note (Signed)
 Likely, discussed with patient about anatomy and options.  Does not appear to need referral at this point for consideration of repair.  Reasonable for observation with routine cautions given his timeline and symptoms and exam.

## 2024-04-03 NOTE — Assessment & Plan Note (Signed)
 A1c d/w pt at OV.  6.8.   He is taking ozempic  0.5mg .  sugar now 150 or lower usually.   No change in diabetes medications in the meantime. Recheck in about 6 months, labs ahead of time if possible.  Continue Farxiga  metformin  and Ozempic .

## 2024-04-05 NOTE — Telephone Encounter (Signed)
 Sent. Thanks.

## 2024-05-19 ENCOUNTER — Other Ambulatory Visit: Payer: Self-pay | Admitting: Family Medicine

## 2024-05-26 ENCOUNTER — Ambulatory Visit: Payer: Medicare HMO

## 2024-05-26 VITALS — Ht 70.0 in | Wt 195.0 lb

## 2024-05-26 DIAGNOSIS — Z0000 Encounter for general adult medical examination without abnormal findings: Secondary | ICD-10-CM

## 2024-05-26 NOTE — Progress Notes (Signed)
 Subjective:   Tim Mccullough is a 73 y.o. who presents for a Medicare Wellness preventive visit.  As a reminder, Annual Wellness Visits don't include a physical exam, and some assessments may be limited, especially if this visit is performed virtually. We may recommend an in-person follow-up visit with your provider if needed.  Visit Complete: Virtual I connected with  Tim Mccullough on 05/26/24 by a audio enabled telemedicine application and verified that I am speaking with the correct person using two identifiers.  Patient Location: Home  Provider Location: Office/Clinic  I discussed the limitations of evaluation and management by telemedicine. The patient expressed understanding and agreed to proceed.  Vital Signs: Because this visit was a virtual/telehealth visit, some criteria may be missing or patient reported. Any vitals not documented were not able to be obtained and vitals that have been documented are patient reported.  VideoDeclined- This patient declined Librarian, academic. Therefore the visit was completed with audio only.  Persons Participating in Visit: Patient.  AWV Questionnaire: No: Patient Medicare AWV questionnaire was not completed prior to this visit.  Cardiac Risk Factors include: advanced age (>27men, >75 women);diabetes mellitus;dyslipidemia;hypertension;male gender;sedentary lifestyle     Objective:    Today's Vitals   05/26/24 0917 05/26/24 0918  Weight: 195 lb (88.5 kg)   Height: 5' 10 (1.778 m)   PainSc:  5    Body mass index is 27.98 kg/m.     05/26/2024    9:28 AM 05/25/2023    8:49 AM 05/21/2022   11:37 AM 10/31/2021    1:10 PM 01/18/2021    9:16 PM 03/18/2020    5:57 AM 01/29/2020    1:46 PM  Advanced Directives  Does Patient Have a Medical Advance Directive? No No No No No No No  Would patient like information on creating a medical advance directive?  No - Patient declined    Yes (MAU/Ambulatory/Procedural  Areas - Information given) No - Patient declined    Current Medications (verified) Outpatient Encounter Medications as of 05/26/2024  Medication Sig   aspirin  EC 81 MG tablet Take 81 mg by mouth daily. Swallow whole.   atorvastatin  (LIPITOR) 40 MG tablet TAKE 1 TABLET BY MOUTH EVERY DAY   carvedilol  (COREG ) 12.5 MG tablet TAKE 1 TABLET BY MOUTH TWICE A DAY   doxazosin  (CARDURA ) 1 MG tablet TAKE 1 TABLET BY MOUTH EVERY DAY   esomeprazole (NEXIUM) 20 MG capsule Take 20 mg by mouth as needed.   FARXIGA  5 MG TABS tablet TAKE 1 TABLET BY MOUTH DAILY BEFORE BREAKFAST.   fish oil-omega-3 fatty acids  1000 MG capsule Take 1 g by mouth 2 (two) times daily.    lansoprazole  (PREVACID ) 15 MG capsule Take 15 mg by mouth daily at 12 noon.   losartan  (COZAAR ) 25 MG tablet Take 0.5 tablets (12.5 mg total) by mouth daily.   metFORMIN  (GLUCOPHAGE ) 500 MG tablet TAKE 2 TABLETS (1,000 MG TOTAL) BY MOUTH 2 (TWO) TIMES DAILY WITH A MEAL.   omeprazole  (PRILOSEC) 20 MG capsule Take 1 capsule (20 mg total) by mouth daily.   ONETOUCH ULTRA test strip USE DAILY TO CHECK SUGAR. E11.9. ONE TOUCH TEST STRIPS.   PARoxetine  (PAXIL ) 20 MG tablet TAKE 1 TABLET BY MOUTH EVERY DAY   Semaglutide ,0.25 or 0.5MG /DOS, (OZEMPIC , 0.25 OR 0.5 MG/DOSE,) 2 MG/3ML SOPN Inject 0.5 mg as directed once a week.   spironolactone  (ALDACTONE ) 25 MG tablet TAKE 1/2 TABLET BY MOUTH DAILY   No facility-administered encounter  medications on file as of 05/26/2024.    Allergies (verified) Patient has no known allergies.   History: Past Medical History:  Diagnosis Date   Abnormal echocardiogram 12/2009   Repeated after med mgmt of cardiomyopathy showed EF 40-45%, mildly dilated LV, mild LV hypertrophy, anterolateral and apical hypokinesis, mild diastolic dysfunction.   Anemia    hx   Anxiety    Arthritis    Blood transfusion without reported diagnosis 09/2012   BPH (benign prostatic hypertrophy) 2004   Cancer [facility])    melanoma back    Cardiomyopathy    ? tachycardia-mediated   Cataract    Depression    Diabetes mellitus 11/2006   Type II   Diverticulosis    Echocardiogram abnormal 05/2009   Mild LVH with a mildly dilated LV.  EF was difficult to assess given poor acoustic windows but appeared moderately decreased.  Mild MR.  Probable small perimembranous VSD, mild LAE   Esophageal erosions    GERD (gastroesophageal reflux disease)    H/O cardiac radiofrequency ablation    Hip pain    History of MRI 05/2009   Cardiac MRI was done to followup difficult echo.  This showed mild to moderately dilated LV, mild to moderate LAE, EF  33% with global hypokinesis, normal RV size with mild systolic dysfunction.  There was no large VSD evident.  There was no myocardial delayed enhancement so no evidence for infiltrative disease or infarction.  LHC (7/10) showed only luminal irreg. in the coronaries and EF 35%   Holter monitor, abnormal 05/2009   Frequent PAC's and PVC's  Average HR 96  (but he had started Toprol  XL).  HR range 71-130   Hyperlipidemia 04/1998   Hypertension 1995   Obesity    OSA (obstructive sleep apnea)    Not using CPAP- improved with weight loss   Pneumonia    HX OF pna   Sinus tachycardia    Pt says he has been told his heart rate is high since he was in high school.    Sleep apnea    has cpap; but does not use   VSD (ventricular septal defect)    Probable small perimembranous   Past Surgical History:  Procedure Laterality Date   CARDIAC CATHETERIZATION  05/30/2009   Dilated cardiomyopathy.  min VSD.  Hypokin Inf  wall.  Multiple min luminal Irr.  EF 35%   CATARACT EXTRACTION, BILATERAL     COLONOSCOPY     COLONOSCOPY W/ BIOPSIES  08/24/2006   Divertics, polyps x3, bx negative, repeat 2012 with mild diverticulosis in teh sigmoid colon and 1 polyp in tranverse colon, repeat due 2017   ESOPHAGOGASTRODUODENOSCOPY  2012   1)Barrett's esoph, 2) Erosive esophagitis, 3) Mild gastritis in the antrum, 4)  Duodenitis in the bulb of duodenum, 5) Small hiatal hernia   INGUINAL HERNIA REPAIR  1985   Left   INGUINAL HERNIA REPAIR  1999   Right   KNEE ARTHROSCOPY  10/15/2011   Procedure: ARTHROSCOPY KNEE;  Surgeon: Toribio JULIANNA Chancy, MD;  Location: [facility];  Service: Orthopedics;  Laterality: Right;  right knee scope with lateral meniscectomy, removal loose foreign body, and microfracture technique   LAMINECTOMY  1987   with discectomy   PVC ABLATION N/A 03/18/2020   Procedure: PVC ABLATION;  Surgeon: Waddell Danelle ORN, MD;  Location: [facility];  Service: Cardiovascular;  Laterality: N/A;   REVERSE SHOULDER ARTHROPLASTY Left 11/19/2021   Procedure: REVERSE SHOULDER ARTHROPLASTY;  Surgeon:  Cristy Bonner DASEN, MD;  Location: [facility];  Service: Orthopedics;  Laterality: Left;   RIGHT/LEFT HEART CATH AND CORONARY ANGIOGRAPHY N/A 12/25/2019   Procedure: RIGHT/LEFT HEART CATH AND CORONARY ANGIOGRAPHY;  Surgeon: Darron Deatrice LABOR, MD;  Location: [facility] INVASIVE CV [facility];  Service: Cardiovascular;  Laterality: N/A;   ROTATOR CUFF REPAIR  12/2009   rt   TOTAL HIP ARTHROPLASTY  2011   Left   TOTAL KNEE ARTHROPLASTY  06/01/2012   Procedure: TOTAL KNEE ARTHROPLASTY;  Surgeon: Toribio JULIANNA Chancy, MD;  Location: [facility];  Service: Orthopedics;  Laterality: Right;   UPPER GASTROINTESTINAL ENDOSCOPY     Family History  Problem Relation Age of Onset   Hypertension Mother    Alzheimer's disease Mother    Heart disease Father        CABG   Stroke Father    Diabetes Father    Diabetes Sister    Hypertension Sister    Cancer Sister        uterine cancer   Diabetes Sister    Hypertension Sister    Colon polyps Brother    Diabetes Brother    Throat cancer Brother        Throat   Esophageal cancer Brother    Diabetes Maternal Grandmother        Insulin    Esophageal cancer Maternal Grandfather    Rectal cancer Cousin    Colon cancer Cousin    Throat cancer Other        Throat   Diabetes  Other    Alcohol abuse Other    Drug abuse Neg Hx    Prostate cancer Neg Hx    Stomach cancer Neg Hx    Social History   Socioeconomic History   Marital status: Single    Spouse name: Not on file   Number of children: 3   Years of education: Not on file   Highest education level: Not on file  Occupational History   Occupation: Health visitor: Therapist, music   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  Tobacco Use   Smoking status: Former    Current packs/day: 0.00    Types: Cigarettes    Start date: 11/09/1978    Quit date: 11/09/1998    Years since quitting: 25.5   Smokeless tobacco: Former    Quit date: 06/03/2007   Tobacco comments:    no alcohol since nov 12  Vaping Use   Vaping status: Never Used  Substance and Sexual Activity   Alcohol use: No    Alcohol/week: 0.0 standard drinks of alcohol    Comment: sober as 09/2011   Drug use: No   Sexual activity: Not Currently  Other Topics Concern   Not on file  Social History Narrative   Lives in [place], lives alone   3 kids (2 sons, 1 daughter)   Divorced   Social Drivers of Corporate investment banker Strain: Low Risk  (05/26/2024)   Overall Financial Resource Strain (CARDIA)    Difficulty of Paying Living Expenses: Not hard at all  Food Insecurity: No Food Insecurity (05/26/2024)   Hunger Vital Sign    Worried About Running Out of Food in the Last Year: Never true    Ran Out of Food in the Last Year: Never true  Transportation Needs: No Transportation Needs (05/26/2024)   PRAPARE - Administrator, Civil Service (Medical): No    Lack of Transportation (Non-Medical): No  Physical Activity: Inactive (05/26/2024)  Exercise Vital Sign    Days of Exercise per Week: 0 days    Minutes of Exercise per Session: 0 min  Stress: No Stress Concern Present (05/26/2024)   [employer] of Occupational Health - Occupational Stress Questionnaire    Feeling of Stress: Not at all  Social Connections: Socially  Isolated (05/26/2024)   Social Connection and Isolation Panel    Frequency of Communication with Friends and Family: More than three times a week    Frequency of Social Gatherings with Friends and Family: Twice a week    Attends Religious Services: Never    Database administrator or Organizations: No    Attends Engineer, structural: Never    Marital Status: Divorced    Tobacco Counseling Counseling given: Not Answered Tobacco comments: no alcohol since nov 12    Clinical Intake:  Pre-visit preparation completed: Yes  Pain : 0-10 Pain Score: 5  Pain Type: Chronic pain Pain Location: Back Pain Orientation: Lower Pain Descriptors / Indicators: Aching Pain Onset: More than a month ago Pain Frequency: Intermittent Pain Relieving Factors: rest  Pain Relieving Factors: rest  BMI - recorded: 27.98 Nutritional Status: BMI 25 -29 Overweight Nutritional Risks: None Diabetes: Yes CBG done?: No Did pt. bring in CBG monitor from home?: No  Lab Results  Component Value Date   HGBA1C 6.8 (A) 03/30/2024   HGBA1C 7.4 (A) 09/28/2023   HGBA1C 8.2 (H) 02/19/2023     How often do you need to have someone help you when you read instructions, pamphlets, or other written materials from your doctor or pharmacy?: 1 - Never  Interpreter Needed?: No Comments: lives alone Information entered by :: B.Florrie Ramires,LPN   Activities of Daily Living     05/26/2024    9:28 AM  In your present state of health, do you have any difficulty performing the following activities:  Hearing? 0  Vision? 0  Difficulty concentrating or making decisions? 0  Walking or climbing stairs? 0  Dressing or bathing? 0  Doing errands, shopping? 0  Preparing Food and eating ? N  Using the Toilet? N  In the past six months, have you accidently leaked urine? N  Do you have problems with loss of bowel control? N  Managing your Medications? N  Managing your Finances? N  Housekeeping or managing your  Housekeeping? N    Patient Care Team: Cleatus Arlyss RAMAN, MD as PCP - General (Family Medicine) Darron Deatrice LABOR, MD as PCP - Cardiology (Cardiology) Geronimo Manuelita SAUNDERS, [facility] (Pharmacist) Cleotilde Sewer, OD (Optometry) Cleatus Arlyss RAMAN, MD as Consulting Physician (Family Medicine)  I have updated your Care Teams any recent Medical Services you may have received from other providers in the past year.     Assessment:   This is a routine wellness examination for [place].  Hearing/Vision screen Hearing Screening - Comments:: Pt says he hears alright but has the &quot;ringing and churping all the time&quot; in both ears Vision Screening - Comments:: Pt says his vision is good;readers only Dr Inetta   Goals Addressed               This Visit's Progress     Manage my Diabetes and heart failure and over all health (pt-stated)   On track     Interventions Today    Flowsheet Row Most Recent Value  Chronic Disease   Chronic disease during today's visit Diabetes, Congestive Heart Failure (CHF), Other  [orthostatic HTN and tachycardia]  General Interventions  General Interventions Discussed/Reviewed General Interventions Reviewed, Labs, Doctor Visits  [evaluation of current treatment plan for listed health conditions and patients adherence to plan as established by provider. Assessed blood sugar, blood pressure/ pulse readings. Assessed for HF symptoms.]  Labs Hgb A1c every 3 months  [Reviewed most recent Hgb A1c. Congratulated patient that Hgb A1c is moving towards goal.]  Doctor Visits Discussed/Reviewed Doctor Visits Reviewed  bethann upcoming provider visits. Advised to keep follow up visits with providers. Advised to contact primary care provider if care coordination services needed in the future.]  Exercise Interventions   Exercise Discussed/Reviewed Physical Activity  [Encouraged patient to consider incorporating physical activity / exercise into daily regimen.  Gave example to do laps  2-3 times per day inside home if to cold to go outside. Advised that any increase in movement/ activity will help.]  Education Interventions   Education Provided Provided Education  [Advised to continue monitoring BP and pulse rate daily. Advised to notify provider of readings outside of established  parameters. Reviewed HF symptoms. Advised to notify provider for increase symptoms.]  Provided Verbal Education On Blood Sugar Monitoring  [encouraged ongoing monitoring of blood sugars daily.  Advised to notify provider for frequent blood sugars < 70 or >250.  Reviewed hypoglycemic treatment.]  Nutrition Interventions   Nutrition Discussed/Reviewed Nutrition Reviewed, Carbohydrate meal planning, Adding fruits and vegetables, Decreasing sugar intake, Portion sizes  [discussed importance of decreasing sugar intake and following a more low carbohydrate diet.  Discussed following plate method for diet control with 1/2 of the plate being vegetables, 1/4 lean protein and 1/4 carbohydrate.]  Pharmacy Interventions   Pharmacy Dicussed/Reviewed Pharmacy Topics Reviewed  [medications reviewed and compliance discussed and advised.]            Patient Stated        05/26/24- I will continue to take medications as prescribed.        COMPLETED: Patient Stated        05/21/2022, wants to lose 20-30 pounds      Patient Stated (pt-stated)   Not on track     05/26/24-Exercise more       Depression Screen     05/26/2024    9:24 AM 03/30/2024    2:04 PM 05/25/2023    8:47 AM 02/19/2023    3:52 PM 08/04/2022   11:20 AM 05/21/2022   11:37 AM 07/03/2021   12:42 PM  PHQ 2/9 Scores  PHQ - 2 Score 1 3 0 0 0 0 0  PHQ- 9 Score  7   0  0    Fall Risk     05/26/2024    9:22 AM 03/30/2024    2:04 PM 05/25/2023    8:50 AM 02/19/2023    3:51 PM 12/04/2022   12:02 PM  Fall Risk   Falls in the past year? 1 1 0 0 0  Number falls in past yr: 0 0 0 0 0  Injury with Fall? 0 0 0 0 0  Risk for fall due to : No Fall Risks  History of fall(s) No Fall Risks No Fall Risks No Fall Risks  Follow up Falls prevention discussed;Education provided Falls evaluation completed Falls prevention discussed Falls evaluation completed Falls evaluation completed    MEDICARE RISK AT HOME:  Medicare Risk at Home Any stairs in or around the home?: No If so, are there any without handrails?: Yes Home free of loose throw rugs in walkways, pet beds, electrical cords, etc?: Yes Adequate lighting  in your home to reduce risk of falls?: Yes Life alert?: No Use of a cane, walker or w/c?: No Grab bars in the bathroom?: Yes Shower chair or bench in shower?: No Elevated toilet seat or a handicapped toilet?: No  TIMED UP AND GO:  Was the test performed?  No  Cognitive Function: 6CIT completed    04/26/2019    9:33 AM 04/08/2018    9:15 AM  MMSE - Mini Mental State Exam  Orientation to time 5 5  Orientation to Place 5 5  Registration 3 3  Attention/ Calculation 0 0  Recall 3 1  Recall-comments  unable to recall 2 of 3 words  Language- name 2 objects 0 0  Language- repeat 1 1  Language- follow 3 step command 0 3  Language- read & follow direction 0 0  Write a sentence 0 0  Copy design 0 0  Total score 17 18        05/26/2024    9:42 AM 05/25/2023    8:50 AM 05/21/2022   11:39 AM  6CIT Screen  What Year? 0 points 0 points 0 points  What month? 0 points 0 points 0 points  What time? 0 points 0 points 0 points  Count back from 20 0 points 0 points 0 points  Months in reverse 0 points 0 points 0 points  Repeat phrase 0 points 0 points 2 points  Total Score 0 points 0 points 2 points    Immunizations Immunization History  Administered Date(s) Administered   Fluad Quad(high Dose 65+) 08/28/2019, 08/04/2022   Influenza Whole 08/28/1998, 08/09/2010   Influenza,inj,Quad PF,6+ Mos 07/28/2013, 08/17/2014, 08/20/2015, 08/24/2016, 08/23/2017, 07/18/2018   Influenza-Unspecified 08/09/2021, 09/14/2023   Moderna Sars-Covid-2  Vaccination 12/23/2019, 01/19/2020, 09/15/2020   Pneumococcal Conjugate-13 08/23/2017   Pneumococcal Polysaccharide-23 06/03/2012, 10/20/2018   Pneumococcal-Unspecified 06/01/2012   Td 11/09/1997   Tdap 08/07/2011, 04/08/2013    Screening Tests Health Maintenance  Topic Date Due   Zoster Vaccines- Shingrix (1 of 2) Never done   Diabetic kidney evaluation - Urine ACR  12/13/2009   OPHTHALMOLOGY EXAM  01/08/2023   DTaP/Tdap/Td (4 - Td or Tdap) 04/09/2023   INFLUENZA VACCINE  06/09/2024   FOOT EXAM  09/27/2024   HEMOGLOBIN A1C  09/30/2024   Diabetic kidney evaluation - eGFR measurement  03/30/2025   Medicare Annual Wellness (AWV)  05/26/2025   Colonoscopy  02/25/2026   Pneumococcal Vaccine: 50+ Years  Completed   Hepatitis C Screening  Completed   Hepatitis B Vaccines  Aged Out   HPV VACCINES  Aged Out   Meningococcal B Vaccine  Aged Out   COVID-19 Vaccine  Discontinued    Health Maintenance  Health Maintenance Due  Topic Date Due   Zoster Vaccines- Shingrix (1 of 2) Never done   Diabetic kidney evaluation - Urine ACR  12/13/2009   OPHTHALMOLOGY EXAM  01/08/2023   DTaP/Tdap/Td (4 - Td or Tdap) 04/09/2023   Health Maintenance Items Addressed: DM f/u scheduled for Nov 2025 Pt says his eye exam is near;awaiting scheduling from [employer] Vaccinations: pt declines: Shingles vaccine: recommend Shingrix which is 2 doses 2-6 months apart and over 90% effective      Additional Screening:  Vision Screening: Recommended annual ophthalmology exams for early detection of glaucoma and other disorders of the eye. Would you like a referral to an eye doctor? No    Dental Screening: Recommended annual dental exams for proper oral hygiene  Community Resource Referral / Chronic Care  Management: CRR required this visit?  No   CCM required this visit?  No   Plan:    I have personally reviewed and noted the following in the patient's chart:   Medical and social history Use of  alcohol, tobacco or illicit drugs  Current medications and supplements including opioid prescriptions. Patient is not currently taking opioid prescriptions. Functional ability and status Nutritional status Physical activity Advanced directives List of other physicians Hospitalizations, surgeries, and ER visits in previous 12 months Vitals Screenings to include cognitive, depression, and falls Referrals and appointments  In addition, I have reviewed and discussed with patient certain preventive protocols, quality metrics, and best practice recommendations. A written personalized care plan for preventive services as well as general preventive health recommendations were provided to patient.   Erminio LITTIE Saris, LPN   [id number]   After Visit Summary: (MyChart) Due to this being a telephonic visit, the after visit summary with patients personalized plan was offered to patient via MyChart   Notes: Nothing significant to report at this time.

## 2024-05-26 NOTE — Patient Instructions (Addendum)
 [name] , Thank you for taking time out of your busy schedule to complete your Annual Wellness Visit with me. I enjoyed our conversation and look forward to speaking with you again next year. I, as well as your care team,  appreciate your ongoing commitment to your health goals. Please review the following plan we discussed and let me know if I can assist you in the future. Your Game plan/ To Do List    Follow up Visits: Next Medicare AWV with our clinical staff: 05/29/25 @ 9:30am televisit   Have you seen your provider in the last 6 months (3 months if uncontrolled diabetes)? Yes Next Office Visit with your provider: 10/02/2024  Clinician Recommendations:  Aim for 30 minutes of exercise or brisk walking, 6-8 glasses of water , and 5 servings of fruits and vegetables each day.       This is a list of the screening recommended for you and due dates:  Health Maintenance  Topic Date Due   Zoster (Shingles) Vaccine (1 of 2) Never done   Yearly kidney health urinalysis for diabetes  12/13/2009   Eye exam for diabetics  01/08/2023   DTaP/Tdap/Td vaccine (4 - Td or Tdap) 04/09/2023   Flu Shot  06/09/2024   Complete foot exam   09/27/2024   Hemoglobin A1C  09/30/2024   Yearly kidney function blood test for diabetes  03/30/2025   Medicare Annual Wellness Visit  05/26/2025   Colon Cancer Screening  02/25/2026   Pneumococcal Vaccine for age over 64  Completed   Hepatitis C Screening  Completed   Hepatitis B Vaccine  Aged Out   HPV Vaccine  Aged Out   Meningitis B Vaccine  Aged Out   COVID-19 Vaccine  Discontinued    Advanced directives: (Declined) Advance directive discussed with you today. Even though you declined this today, please call our office should you change your mind, and we can give you the proper paperwork for you to fill out. Advance Care Planning is important because it:  [x]  Makes sure you receive the medical care that is consistent with your values, goals, and  preferences  [x]  It provides guidance to your family and loved ones and reduces their decisional burden about whether or not they are making the right decisions based on your wishes.  Follow the link provided in your after visit summary or read over the paperwork we have mailed to you to help you started getting your Advance Directives in place. If you need assistance in completing these, please reach out to us  so that we can help you!

## 2024-06-04 ENCOUNTER — Other Ambulatory Visit: Payer: Self-pay | Admitting: Family Medicine

## 2024-07-31 ENCOUNTER — Ambulatory Visit (INDEPENDENT_AMBULATORY_CARE_PROVIDER_SITE_OTHER)

## 2024-07-31 ENCOUNTER — Ambulatory Visit: Attending: Cardiology | Admitting: Cardiology

## 2024-07-31 ENCOUNTER — Encounter: Payer: Self-pay | Admitting: Cardiology

## 2024-07-31 VITALS — BP 94/68 | HR 101 | Ht 70.0 in | Wt 204.8 lb

## 2024-07-31 DIAGNOSIS — I493 Ventricular premature depolarization: Secondary | ICD-10-CM

## 2024-07-31 DIAGNOSIS — I5032 Chronic diastolic (congestive) heart failure: Secondary | ICD-10-CM

## 2024-07-31 NOTE — Patient Instructions (Signed)
 Medication Instructions:  Your physician recommends the following medication changes.   START TAKING: Spironolactone  in the morning Farxiga  in the morning   *If you need a refill on your cardiac medications before your next appointment, please call your pharmacy*  Lab Work: No labs ordered today  If you have labs (blood work) drawn today and your tests are completely normal, you will receive your results only by: MyChart Message (if you have MyChart) OR A paper copy in the mail If you have any lab test that is abnormal or we need to change your treatment, we will call you to review the results.  Testing/Procedures: Your physician has requested that you have an echocardiogram. Echocardiography is a painless test that uses sound waves to create images of your heart. It provides your doctor with information about the size and shape of your heart and how well your heart's chambers and valves are working.   You may receive an ultrasound enhancing agent through an IV if needed to better visualize your heart during the echo. This procedure takes approximately one hour.  There are no restrictions for this procedure.  This will take place at [address] [facility] [address], [place] [address]  Please note: We ask at that you not bring children with you during ultrasound (echo/ vascular) testing. Due to room size and safety concerns, children are not allowed in the ultrasound rooms during exams. Our front office staff cannot provide observation of children in our lobby area while testing is being conducted. An adult accompanying a patient to their appointment will only be allowed in the ultrasound room at the discretion of the ultrasound technician under special circumstances. We apologize for any inconvenience.    Your physician has recommended that you wear a Zio monitor.   This monitor is a medical device that records the heart's electrical activity. Doctors most often use  these monitors to diagnose arrhythmias. Arrhythmias are problems with the speed or rhythm of the heartbeat. The monitor is a small device applied to your chest. You can wear one while you do your normal daily activities. While wearing this monitor if you have any symptoms to push the button and record what you felt. Once you have worn this monitor for the period of time provider prescribed (Usually 14 days), you will return the monitor device in the postage paid box. Once it is returned they will download the data collected and provide us  with a report which the provider will then review and we will call you with those results. Important tips:  Avoid showering during the first 24 hours of wearing the monitor. Avoid excessive sweating to help maximize wear time. Do not submerge the device, no hot tubs, and no swimming pools. Keep any lotions or oils away from the patch. After 24 hours you may shower with the patch on. Take brief showers with your back facing the shower head.  Do not remove patch once it has been placed because that will interrupt data and decrease adhesive wear time. Push the button when you have any symptoms and write down what you were feeling. Once you have completed wearing your monitor, remove and place into box which has postage paid and place in your outgoing mailbox.  If for some reason you have misplaced your box then call our office and we can provide another box and/or mail it off for you.     Follow-Up: At [facility], you and your health needs are our priority.  As part of our continuing mission to provide you with exceptional heart care, our providers are all part of one team.  This team includes your primary Cardiologist (physician) and Advanced Practice Providers or APPs (Physician Assistants and Nurse Practitioners) who all work together to provide you with the care you need, when you need it.  Your next appointment:   1 year(s)  Provider:   Dr.Parker      We recommend signing up for the patient portal called MyChart.  Sign up information is provided on this After Visit Summary.  MyChart is used to connect with patients for Virtual Visits (Telemedicine).  Patients are able to view lab/test results, encounter notes, upcoming appointments, etc.  Non-urgent messages can be sent to your provider as well.   To learn more about what you can do with MyChart, go to [email or web address].

## 2024-07-31 NOTE — Progress Notes (Signed)
 Electrophysiology Clinic Note    Date:  07/31/2024  Patient ID:  Tim Mccullough, Tim Mccullough 1951/02/22, MRN [id number] PCP:  Cleatus Arlyss RAMAN, MD  Cardiologist:  Deatrice Cage, MD   Electrophysiology APP:  Dionne Knoop, NP      Discussed the use of AI scribe software for clinical note transcription with the patient, who gave verbal consent to proceed.   Patient Profile    Chief Complaint: PVC follow-up  History of Present Illness: Tim Mccullough is a 73 y.o. male with PMH notable for PVC s/p ablation, NICM, HFimpEF, HTN, HLD, OSA, T2DM, ; seen today for Dr. Fernande for routine electrophysiology followup.   He is s/p PVC ablation 08/2020 by Dr. Genna. He last saw Dr. Fernande 06/2023 where he c/o fatigue. Lab work normal at that time.   On follow-up today, he is overall doing very well. He has noticed some increased fatigue with walking to mailbox over the past few months. No chest pain, chest pressure, palpitations. He denies lower extremity edema, dizziness, LH. He is on ozempic  and has reduced appetite with early satiety. He is not aware of PVCs since his ablation.  He gets up multiple times throughout the night to urinate.   Recently 2 sisters have passed, and 1 good friend (fishing buddy) is in hospice for lung cancer.      Arrhythmia/Device History Flecainide  - ineffective Amiodarone - stopped after PVC ablation 2021    ROS:  Please see the history of present illness. All other systems are reviewed and otherwise negative.    Physical Exam    VS:  BP 94/68   Pulse (!) 101   Ht 5' 10 (1.778 m)   Wt 204 lb 12.8 oz (92.9 kg)   SpO2 96%   BMI 29.39 kg/m  BMI: Body mass index is 29.39 kg/m.      Wt Readings from Last 3 Encounters:  07/31/24 204 lb 12.8 oz (92.9 kg)  05/26/24 195 lb (88.5 kg)  03/30/24 202 lb 12.8 oz (92 kg)     GEN- The patient is well appearing, alert and oriented x 3 today.   Lungs- Clear to ausculation bilaterally, normal work of  breathing.  Heart- Regular rate and rhythm, no murmurs, rubs or gallops Extremities- No peripheral edema, warm, dry   Studies Reviewed   Previous EP, cardiology notes.    EKG is ordered. Personal review of EKG from today shows:    EKG Interpretation Date/Time:  Monday July 31 2024 14:34:32 EDT Ventricular Rate:  101 PR Interval:  156 QRS Duration:  132 QT Interval:  380 QTC Calculation: 492 R Axis:   -14  Text Interpretation: Sinus tachycardia with Premature ventricular complexes Right bundle branch block Confirmed by Costas Sena [phone]) on 07/31/2024 2:43:05 PM    Rhythm strip - SR with no PVCs  TTE, 06/13/2021  1. Left ventricular ejection fraction, by estimation, is 50 to 55%. The left ventricle has low normal function. The left ventricle has no regional wall motion abnormalities. Left ventricular diastolic parameters are consistent with Grade I diastolic dysfunction (impaired relaxation).   2. Right ventricular systolic function is normal. The right ventricular size is normal. There is mildly elevated pulmonary artery systolic pressure. The estimated right ventricular systolic pressure is 42.7 mmHg.   3. Left atrial size was mildly dilated.   4. The mitral valve is normal in structure. Mild mitral valve regurgitation.   5. Tricuspid valve regurgitation is mild to moderate.   Long term  monitor, 10/31/2020     TTE, 10/02/2020  1. Left ventricular ejection fraction, by estimation, is 30 to 35%. The left ventricle has moderate to severely decreased function. The left ventricle demonstrates global hypokinesis. Left ventricular diastolic parameters are consistent with Grade I diastolic dysfunction (impaired relaxation).   2. Right ventricular systolic function is low normal. The right ventricular size is not well visualized.   3. The mitral valve is normal in structure. Trivial mitral valve regurgitation.   4. The aortic valve is tricuspid. Aortic valve regurgitation is mild.   Comparison(s): Previous Echo showed LV EF 35-40%, not optimally defined.    Assessment and Plan     #) PVC s/p ablation Interestingly, his EKG today has frequent PVCs whereas his rhythm strip has no PVCs Update zio to re-eval burden Update TTE to confirm stable LVEF Continue 12.5mg  coreg  BID   #) HFimpEF Recent BMP with stable kidney function, continue 12.5mg  losartan , 12.5mg  spiro, 5mg  farxiga , coreg  as above Consider switching spiro and farxiga  to AM administration to help with nocturia Update TTE as above Continue physical activity as tolerated         Current medicines are reviewed at length with the patient today.   The patient does not have concerns regarding his medicines.  The following changes were made today:  none  Labs/ tests ordered today include:  Orders Placed This Encounter  Procedures   LONG TERM MONITOR (3-14 DAYS)   EKG 12-Lead   ECHOCARDIOGRAM COMPLETE     Disposition: Follow up with Dr. Kennyth or EP APP in 12 months, sooner if needed. Prefer MD to establish care   Signed, Chantal Needle, NP  07/31/24  3:54 PM  Electrophysiology [facility]

## 2024-08-08 NOTE — Progress Notes (Signed)
 Tim Mccullough                                          MRN: [id number]   08/08/2024   The VBCI Quality Team Specialist reviewed this patient medical record for the purposes of chart review for care gap closure. The following were reviewed: chart review for care gap closure-kidney health evaluation for diabetes:eGFR  and uACR.    VBCI Quality Team

## 2024-08-09 DIAGNOSIS — I5032 Chronic diastolic (congestive) heart failure: Secondary | ICD-10-CM

## 2024-08-09 DIAGNOSIS — I493 Ventricular premature depolarization: Secondary | ICD-10-CM | POA: Diagnosis not present

## 2024-08-17 ENCOUNTER — Ambulatory Visit: Payer: Self-pay | Admitting: Cardiology

## 2024-08-17 ENCOUNTER — Ambulatory Visit: Attending: Cardiology

## 2024-08-17 DIAGNOSIS — I493 Ventricular premature depolarization: Secondary | ICD-10-CM | POA: Diagnosis not present

## 2024-08-17 DIAGNOSIS — I5020 Unspecified systolic (congestive) heart failure: Secondary | ICD-10-CM

## 2024-08-17 LAB — ECHOCARDIOGRAM COMPLETE
AR max vel: 2.14 cm2
AV Area VTI: 2.2 cm2
AV Area mean vel: 2.11 cm2
AV Mean grad: 2 mmHg
AV Peak grad: 3.6 mmHg
Ao pk vel: 0.95 m/s
S' Lateral: 4.8 cm

## 2024-08-21 DIAGNOSIS — Z87891 Personal history of nicotine dependence: Secondary | ICD-10-CM | POA: Diagnosis not present

## 2024-08-21 DIAGNOSIS — Z7722 Contact with and (suspected) exposure to environmental tobacco smoke (acute) (chronic): Secondary | ICD-10-CM | POA: Diagnosis not present

## 2024-08-22 DIAGNOSIS — I5032 Chronic diastolic (congestive) heart failure: Secondary | ICD-10-CM | POA: Diagnosis not present

## 2024-08-22 DIAGNOSIS — I493 Ventricular premature depolarization: Secondary | ICD-10-CM | POA: Diagnosis not present

## 2024-08-28 DIAGNOSIS — Z87891 Personal history of nicotine dependence: Secondary | ICD-10-CM | POA: Diagnosis not present

## 2024-08-28 DIAGNOSIS — Z122 Encounter for screening for malignant neoplasm of respiratory organs: Secondary | ICD-10-CM | POA: Diagnosis not present

## 2024-08-28 DIAGNOSIS — Z7722 Contact with and (suspected) exposure to environmental tobacco smoke (acute) (chronic): Secondary | ICD-10-CM | POA: Diagnosis not present

## 2024-09-01 ENCOUNTER — Telehealth: Payer: Self-pay | Admitting: Cardiovascular Disease

## 2024-09-01 DIAGNOSIS — I5022 Chronic systolic (congestive) heart failure: Secondary | ICD-10-CM

## 2024-09-01 DIAGNOSIS — I493 Ventricular premature depolarization: Secondary | ICD-10-CM

## 2024-09-01 NOTE — Telephone Encounter (Signed)
 Patient called to follow-up on heart monitor results.  Patient also wants to know if it would be safe for him to travel to car to [place] .

## 2024-09-01 NOTE — Telephone Encounter (Signed)
 Returned pt's call inquiring about results of Zio heart monitor; verified pts using 2 identifers; pt inquiring about traveling to [facility] in a car; denies chest pain, dizziness, or shortness of breath; states he feels tired all the time, but otherwise feels ok; I told him it would be his decision and if he did travel to make sure he took a break every 2 hours to get out of the car and walk/stretch legs, not to drive straight through for 6-7 hours.  Pt verbalized understanding.  Will reach out to Suzann Riddle regarding his zio monitor results.

## 2024-09-06 NOTE — Telephone Encounter (Signed)
 I called patient to review reduced LVEF and unofficial zio report of low PVC burden.  He continues to have reduced energy, fatigue with walking short distances. He denies chest pain, chest pressure.   Recommended an ischemic evaluation with coronary CTA.  Patient verbalized understanding and wished to proceed.

## 2024-09-07 ENCOUNTER — Other Ambulatory Visit: Payer: Self-pay | Admitting: *Deleted

## 2024-09-07 NOTE — Progress Notes (Signed)
 Tim Mccullough                                          MRN: [id number]   09/07/2024   The VBCI Quality Team Specialist reviewed this patient medical record for the purposes of chart review for care gap closure. The following were reviewed: chart review for care gap closure-diabetic eye exam.    VBCI Quality Team

## 2024-09-08 ENCOUNTER — Encounter: Payer: Self-pay | Admitting: *Deleted

## 2024-09-12 ENCOUNTER — Other Ambulatory Visit: Payer: Self-pay

## 2024-09-12 ENCOUNTER — Encounter: Payer: Self-pay | Admitting: *Deleted

## 2024-09-12 MED ORDER — IVABRADINE HCL 5 MG PO TABS
ORAL_TABLET | ORAL | 0 refills | Status: AC
  Filled 2024-09-12 – 2024-09-28 (×2): qty 3, 1d supply, fill #0

## 2024-09-12 NOTE — Addendum Note (Signed)
 Addended by: ESTELLE OLAM GRADE on: 09/12/2024 03:27 PM   Modules accepted: Orders

## 2024-09-12 NOTE — Telephone Encounter (Signed)
 Called and spoke to the patient. He has agreed to the Cardiac CT. Instructions have been provided and a letter will be mailed as well.       Your cardiac CT will be scheduled at one of the below locations:   [facility] [address] [place], [place] [address] [phone]   If scheduled at [facility], please arrive to the Heart and Vascular Center 15 mins early for check-in and test prep.  There is spacious parking and easy access to the radiology department from the [facility] Heart and Vascular entrance. Please enter here and check-in with the desk attendant.    Please follow these instructions carefully (unless otherwise directed):  An IV will be required for this test and Nitroglycerin will be given.  Hold all erectile dysfunction medications at least 3 days (72 hrs) prior to test. (Ie viagra, cialis, sildenafil, tadalafil, etc)   On the Night Before the Test: Be sure to Drink plenty of water . Do not consume any caffeinated/decaffeinated beverages or chocolate 12 hours prior to your test. Do not take any antihistamines 12 hours prior to your test.  On the Day of the Test: Drink plenty of water  until 1 hour prior to the test. Do not eat any food 1 hour prior to test. You may take your regular medications prior to the test.  Take Ivabradine (Corlaner) 15 mg two hours before the test.  Hold blood pressure medications.  Hold the Spironolactone  the morning of the test. Patients who wear a continuous glucose monitor MUST remove the device prior to scanning.      After the Test: Drink plenty of water . After receiving IV contrast, you may experience a mild flushed feeling. This is normal. On occasion, you may experience a mild rash up to 24 hours after the test. This is not dangerous. If this occurs, you can take Benadryl  25 mg, Zyrtec, Claritin, or Allegra and increase your fluid intake. (Patients taking Tikosyn should avoid Benadryl ,  and may take Zyrtec, Claritin, or Allegra) If you experience trouble breathing, this can be serious. If it is severe call 911 IMMEDIATELY. If it is mild, please call our office.  We will call to schedule your test 2-4 weeks out understanding that some insurance companies will need an authorization prior to the service being performed.   For more information and frequently asked questions, please visit our website : [email or web address]  For non-scheduling related questions, please contact the cardiac imaging nurse navigator should you have any questions/concerns: Cardiac Imaging Nurse Navigators Direct Office Dial: [phone]   For scheduling needs, including cancellations and rescheduling, please call Brittany, [phone].

## 2024-09-22 ENCOUNTER — Other Ambulatory Visit: Payer: Self-pay

## 2024-09-25 NOTE — Progress Notes (Signed)
 error

## 2024-09-27 ENCOUNTER — Encounter (HOSPITAL_COMMUNITY): Payer: Self-pay

## 2024-09-27 DIAGNOSIS — Z008 Encounter for other general examination: Secondary | ICD-10-CM | POA: Diagnosis not present

## 2024-09-28 ENCOUNTER — Other Ambulatory Visit: Payer: Self-pay

## 2024-09-29 ENCOUNTER — Telehealth (HOSPITAL_COMMUNITY): Payer: Self-pay | Admitting: Emergency Medicine

## 2024-09-29 NOTE — Telephone Encounter (Signed)
 Left message about auth denial for Mondays 11/24 CCTA. Taking patient off schedule. Camie Shutter RN Navigator Cardiac Imaging [facility] Heart and Vascular Services [phone] Office  [phone] Cell

## 2024-10-02 ENCOUNTER — Encounter: Payer: Self-pay | Admitting: Family Medicine

## 2024-10-02 ENCOUNTER — Ambulatory Visit: Admitting: Family Medicine

## 2024-10-02 ENCOUNTER — Ambulatory Visit: Admission: RE | Admit: 2024-10-02

## 2024-10-02 ENCOUNTER — Ambulatory Visit
Admission: RE | Admit: 2024-10-02 | Discharge: 2024-10-02 | Disposition: A | Discharge status: Home / Self Care | Source: Ambulatory Visit | Attending: Cardiology | Admitting: Cardiology

## 2024-10-02 ENCOUNTER — Ambulatory Visit: Payer: Self-pay | Admitting: Family Medicine

## 2024-10-02 VITALS — BP 110/66 | HR 94 | Temp 98.1°F | Ht 70.0 in | Wt 205.0 lb

## 2024-10-02 DIAGNOSIS — N1831 Chronic kidney disease, stage 3a: Secondary | ICD-10-CM | POA: Diagnosis not present

## 2024-10-02 DIAGNOSIS — E1122 Type 2 diabetes mellitus with diabetic chronic kidney disease: Secondary | ICD-10-CM

## 2024-10-02 DIAGNOSIS — Z23 Encounter for immunization: Secondary | ICD-10-CM

## 2024-10-02 DIAGNOSIS — I5022 Chronic systolic (congestive) heart failure: Secondary | ICD-10-CM | POA: Diagnosis not present

## 2024-10-02 DIAGNOSIS — E119 Type 2 diabetes mellitus without complications: Secondary | ICD-10-CM

## 2024-10-02 DIAGNOSIS — I493 Ventricular premature depolarization: Secondary | ICD-10-CM | POA: Insufficient documentation

## 2024-10-02 LAB — MICROALBUMIN / CREATININE URINE RATIO
Creatinine,U: 96.9 mg/dL
Microalb Creat Ratio: 19.3 mg/g (ref 0.0–30.0)
Microalb, Ur: 1.9 mg/dL (ref 0.0–1.9)

## 2024-10-02 LAB — POCT GLYCOSYLATED HEMOGLOBIN (HGB A1C): Hemoglobin A1C: 7.5 % — AB (ref 4.0–5.6)

## 2024-10-02 MED ORDER — NITROGLYCERIN 0.4 MG SL SUBL
0.8000 mg | SUBLINGUAL_TABLET | Freq: Once | SUBLINGUAL | Status: AC
  Administered 2024-10-02: 0.8 mg via SUBLINGUAL
  Filled 2024-10-02: qty 25

## 2024-10-02 MED ORDER — DILTIAZEM HCL 25 MG/5ML IV SOLN: 10.0000 mg | INTRAVENOUS | Status: DC | PRN

## 2024-10-02 MED ORDER — PAROXETINE HCL 20 MG PO TABS: 20.0000 mg | ORAL_TABLET | Freq: Every day | ORAL | 3 refills | Status: AC

## 2024-10-02 MED ORDER — ATORVASTATIN CALCIUM 40 MG PO TABS: 40.0000 mg | ORAL_TABLET | Freq: Every day | ORAL | 3 refills | Status: AC

## 2024-10-02 MED ORDER — SPIRONOLACTONE 25 MG PO TABS: ORAL_TABLET | ORAL | 3 refills | Status: AC

## 2024-10-02 MED ORDER — METOPROLOL TARTRATE 5 MG/5ML IV SOLN
INTRAVENOUS | Status: AC
Start: 2024-10-02 — End: 2024-10-02
  Filled 2024-10-02: qty 5

## 2024-10-02 MED ORDER — LOSARTAN POTASSIUM 25 MG PO TABS
12.5000 mg | ORAL_TABLET | Freq: Every day | ORAL | 3 refills | Status: AC
Start: 2024-10-02 — End: ?

## 2024-10-02 MED ORDER — METOPROLOL TARTRATE 5 MG/5ML IV SOLN
10.0000 mg | Freq: Once | INTRAVENOUS | Status: AC | PRN
  Administered 2024-10-02: 5 mg via INTRAVENOUS

## 2024-10-02 MED ORDER — IOHEXOL 350 MG/ML SOLN
100.0000 mL | Freq: Once | INTRAVENOUS | Status: AC | PRN
  Administered 2024-10-02: 100 mL via INTRAVENOUS

## 2024-10-02 MED ORDER — DOXAZOSIN MESYLATE 1 MG PO TABS: 1.0000 mg | ORAL_TABLET | Freq: Every day | ORAL | 3 refills | Status: AC

## 2024-10-02 NOTE — Progress Notes (Unsigned)
 Diabetes:  Using medications without difficulties: yes Hypoglycemic episodes:no Hyperglycemic episodes:no Feet problems: some occ tingling.  At baseline.   Blood Sugars averaging: usually ~150.   eye exam within last year: due, d/w pt.   A1c d/w pt at OV.   MALB pending.   He has cardiology testing pending.   He is having about 25 people coming over for Thanksgiving.   Meds, vitals, and allergies reviewed.   ROS: Per HPI unless specifically indicated in ROS section   GEN: nad, alert and oriented HEENT: ncat NECK: supple w/o LA CV: rrr. PULM: ctab, no inc wob ABD: soft, +bs EXT: no edema SKIN: well perfused.   Diabetic foot exam: Normal inspection No skin breakdown No calluses  Normal DP pulses Normal sensation to light touch and monofilament Nails normal except for L 1st nail with mild fungal changes.

## 2024-10-02 NOTE — Patient Instructions (Addendum)
 Recheck at a yearly visit in about 6 months.  Labs ahead of time if possible.  Take care.  Glad to see you. Go to the lab on the way out.   If you have mychart we'll likely use that to update you.

## 2024-10-03 ENCOUNTER — Ambulatory Visit: Payer: Self-pay | Admitting: Cardiology

## 2024-10-04 NOTE — Assessment & Plan Note (Signed)
 A1c d/w pt at OV.   MALB pending.  No change in meds today. Recheck at a yearly visit in about 6 months.  Labs ahead of time if possible.  Continue Farxiga  metformin  and Ozempic .  I will await his cardiology follow-up notes/results.SABRA

## 2024-10-20 ENCOUNTER — Ambulatory Visit: Attending: Cardiovascular Disease | Admitting: Cardiovascular Disease

## 2024-10-20 VITALS — BP 108/70 | HR 105 | Ht 70.0 in | Wt 200.2 lb

## 2024-10-20 DIAGNOSIS — E785 Hyperlipidemia, unspecified: Secondary | ICD-10-CM | POA: Diagnosis not present

## 2024-10-20 DIAGNOSIS — E1169 Type 2 diabetes mellitus with other specified complication: Secondary | ICD-10-CM | POA: Diagnosis not present

## 2024-10-20 DIAGNOSIS — I10 Essential (primary) hypertension: Secondary | ICD-10-CM | POA: Diagnosis not present

## 2024-10-20 DIAGNOSIS — I5022 Chronic systolic (congestive) heart failure: Secondary | ICD-10-CM | POA: Diagnosis not present

## 2024-10-20 DIAGNOSIS — I493 Ventricular premature depolarization: Secondary | ICD-10-CM | POA: Diagnosis not present

## 2024-10-20 MED ORDER — METOPROLOL SUCCINATE ER 100 MG PO TB24: 100.0000 mg | ORAL_TABLET | Freq: Every day | ORAL | 3 refills | Status: AC

## 2024-10-20 NOTE — Progress Notes (Signed)
 Cardiology Office Note   Date:  10/20/2024   ID:  Tim Mccullough, Tim Mccullough 1951/08/30, MRN [id number]  PCP:  Cleatus Arlyss RAMAN, MD  Cardiologist:   Deatrice Cage, MD   Chief Complaint  Patient presents with   Follow-up    Discuss EF and worsening exercise tolerance c/o fatigue. Meds reviewed verbally with pt.      History of Present Illness: Tim Mccullough is a 73 y.o. male who presents for a follow-up visit regarding chronic systolic heart failure due to nonischemic cardiomyopathy. This was diagnosed in 2010. Cardiac catheterization showed minor luminal irregularities without obstructive CAD. Ejection fraction was 35%.  Subsequently, his ejection fraction improved to  50-55% in May of  2013 .  He has other chronic medical conditions that include diabetes, Hyperlipidemia, frequent PVCs and essential hypertension.  He had carotid Doppler done in 2017 for carotid bruits which showed mild bilateral internal carotid artery disease.    He had an echocardiogram done in January 2021 which showed worsening LV systolic function with an EF of 30 to 35%.  Repeat coronary angiography showed normal coronary arteries.    Right heart cath showed mildly elevated filling pressures and pulmonary hypertension with mildly reduced cardiac output.  The patient was noted to have frequent PVCs thought to be possibly the culprit for worsening LV systolic function.  He ultimately underwent PVC ablation with improvement. Repeat echocardiogram in 2022 showed improvement in EF to 50 to 55%.  He had a follow-up echocardiogram done in October which showed an EF of 35 to 40%.  Outpatient ZIO monitor showed only short runs of SVT and a PVC burden of 1.1%.  Cardiac CTA showed a calcium  score of 118 with mild nonobstructive disease.  He has been doing well with no chest pain.  He reports mild fatigue but no significant dyspnea, orthopnea or lower extremity edema.  Past Medical History:  Diagnosis Date   Abnormal  echocardiogram 12/2009   Repeated after med mgmt of cardiomyopathy showed EF 40-45%, mildly dilated LV, mild LV hypertrophy, anterolateral and apical hypokinesis, mild diastolic dysfunction.   Anemia    hx   Anxiety    Arthritis    Blood transfusion without reported diagnosis 09/2012   BPH (benign prostatic hypertrophy) 2004   Cancer [facility])    melanoma back   Cardiomyopathy    ? tachycardia-mediated   Cataract    Depression    Diabetes mellitus 11/2006   Type II   Diverticulosis    Echocardiogram abnormal 05/2009   Mild LVH with a mildly dilated LV.  EF was difficult to assess given poor acoustic windows but appeared moderately decreased.  Mild MR.  Probable small perimembranous VSD, mild LAE   Esophageal erosions    GERD (gastroesophageal reflux disease)    H/O cardiac radiofrequency ablation    Hip pain    History of MRI 05/2009   Cardiac MRI was done to followup difficult echo.  This showed mild to moderately dilated LV, mild to moderate LAE, EF  33% with global hypokinesis, normal RV size with mild systolic dysfunction.  There was no large VSD evident.  There was no myocardial delayed enhancement so no evidence for infiltrative disease or infarction.  LHC (7/10) showed only luminal irreg. in the coronaries and EF 35%   Holter monitor, abnormal 05/2009   Frequent PAC's and PVC's  Average HR 96  (but he had started Toprol  XL).  HR range 71-130   Hyperlipidemia 04/1998   Hypertension  1995   Obesity    OSA (obstructive sleep apnea)    Not using CPAP- improved with weight loss   Pneumonia    HX OF pna   Sinus tachycardia    Pt says he has been told his heart rate is high since he was in high school.    Sleep apnea    has cpap; but does not use   VSD (ventricular septal defect)    Probable small perimembranous    Past Surgical History:  Procedure Laterality Date   CARDIAC CATHETERIZATION  05/30/2009   Dilated cardiomyopathy.  min VSD.  Hypokin Inf  wall.  Multiple min  luminal Irr.  EF 35%   CATARACT EXTRACTION, BILATERAL     COLONOSCOPY     COLONOSCOPY W/ BIOPSIES  08/24/2006   Divertics, polyps x3, bx negative, repeat 2012 with mild diverticulosis in teh sigmoid colon and 1 polyp in tranverse colon, repeat due 2017   ESOPHAGOGASTRODUODENOSCOPY  2012   1)Barrett's esoph, 2) Erosive esophagitis, 3) Mild gastritis in the antrum, 4) Duodenitis in the bulb of duodenum, 5) Small hiatal hernia   INGUINAL HERNIA REPAIR  1985   Left   INGUINAL HERNIA REPAIR  1999   Right   KNEE ARTHROSCOPY  10/15/2011   Procedure: ARTHROSCOPY KNEE;  Surgeon: Toribio JULIANNA Chancy, MD;  Location: [facility];  Service: Orthopedics;  Laterality: Right;  right knee scope with lateral meniscectomy, removal loose foreign body, and microfracture technique   LAMINECTOMY  1987   with discectomy   PVC ABLATION N/A 03/18/2020   Procedure: PVC ABLATION;  Surgeon: Waddell Danelle ORN, MD;  Location: [facility];  Service: Cardiovascular;  Laterality: N/A;   REVERSE SHOULDER ARTHROPLASTY Left 11/19/2021   Procedure: REVERSE SHOULDER ARTHROPLASTY;  Surgeon: Cristy Bonner DASEN, MD;  Location: [facility];  Service: Orthopedics;  Laterality: Left;   RIGHT/LEFT HEART CATH AND CORONARY ANGIOGRAPHY N/A 12/25/2019   Procedure: RIGHT/LEFT HEART CATH AND CORONARY ANGIOGRAPHY;  Surgeon: Darron Deatrice LABOR, MD;  Location: [facility];  Service: Cardiovascular;  Laterality: N/A;   ROTATOR CUFF REPAIR  12/2009   rt   TOTAL HIP ARTHROPLASTY  2011   Left   TOTAL KNEE ARTHROPLASTY  06/01/2012   Procedure: TOTAL KNEE ARTHROPLASTY;  Surgeon: Toribio JULIANNA Chancy, MD;  Location: [facility] OR;  Service: Orthopedics;  Laterality: Right;   UPPER GASTROINTESTINAL ENDOSCOPY       Current Outpatient Medications  Medication Sig Dispense Refill   aspirin  EC 81 MG tablet Take 81 mg by mouth daily. Swallow whole.     atorvastatin  (LIPITOR) 40 MG tablet Take 1 tablet (40 mg total) by mouth daily. 90 tablet 3    carvedilol  (COREG ) 12.5 MG tablet TAKE 1 TABLET BY MOUTH TWICE A DAY 180 tablet 3   doxazosin  (CARDURA ) 1 MG tablet Take 1 tablet (1 mg total) by mouth daily. 90 tablet 3   esomeprazole (NEXIUM) 20 MG capsule Take 20 mg by mouth as needed.     FARXIGA  5 MG TABS tablet TAKE 1 TABLET BY MOUTH EVERY DAY BEFORE BREAKFAST 90 tablet 3   fish oil-omega-3 fatty acids  1000 MG capsule Take 1 g by mouth 2 (two) times daily.      lansoprazole  (PREVACID ) 15 MG capsule Take 15 mg by mouth daily at 12 noon.     losartan  (COZAAR ) 25 MG tablet Take 0.5 tablets (12.5 mg total) by mouth daily. 45 tablet 3   metFORMIN  (GLUCOPHAGE ) 500 MG tablet TAKE  2 TABLETS (1,000 MG TOTAL) BY MOUTH 2 (TWO) TIMES DAILY WITH A MEAL. 360 tablet 3   omeprazole  (PRILOSEC) 20 MG capsule Take 1 capsule (20 mg total) by mouth daily. 30 capsule 11   ONETOUCH ULTRA test strip USE DAILY TO CHECK SUGAR. E11.9. ONE TOUCH TEST STRIPS. 100 strip 12   PARoxetine  (PAXIL ) 20 MG tablet Take 1 tablet (20 mg total) by mouth daily. 90 tablet 3   Semaglutide ,0.25 or 0.5MG /DOS, (OZEMPIC , 0.25 OR 0.5 MG/DOSE,) 2 MG/3ML SOPN Inject 0.5 mg as directed once a week. 3 mL 12   spironolactone  (ALDACTONE ) 25 MG tablet TAKE 1/2 TABLET BY MOUTH DAILY 45 tablet 3   ivabradine  (CORLANOR) 5 MG TABS tablet Take 3 tablets (15 mg) two hours before the test. (Patient not taking: Reported on 10/20/2024) 3 tablet 0   No current facility-administered medications for this visit.    Allergies:   Patient has no known allergies.    Social History:  The patient  reports that he quit smoking about 25 years ago. His smoking use included cigarettes. He started smoking about 45 years ago. He quit smokeless tobacco use about 17 years ago. He reports that he does not drink alcohol and does not use drugs.   Family History:  The patient's family history includes Alcohol abuse in an other family member; Alzheimer's disease in his mother; Cancer in his sister; Colon cancer in his  cousin; Colon polyps in his brother; Diabetes in his brother, father, maternal grandmother, sister, sister, and another family member; Esophageal cancer in his brother and maternal grandfather; Heart disease in his father; Hypertension in his mother, sister, and sister; Rectal cancer in his cousin; Stroke in his father; Throat cancer in his brother and another family member.    ROS:  Please see the history of present illness.   Otherwise, review of systems are positive for none.   All other systems are reviewed and negative.    PHYSICAL EXAM: VS:  BP 108/70 (BP Location: Left Arm, Patient Position: Sitting, Cuff Size: Normal)   Pulse (!) 105   Ht 5' 10 (1.778 m)   Wt 200 lb 4 oz (90.8 kg)   SpO2 98%   BMI 28.73 kg/m  , BMI Body mass index is 28.73 kg/m. GEN: Well nourished, well developed, in no acute distress  HEENT: normal  Neck: no JVD, or masses. Faint left carotid bruit Cardiac: RRR; no murmurs, rubs, or gallops,no edema  Respiratory:  clear to auscultation bilaterally, normal work of breathing GI: soft, nontender, nondistended, + BS MS: no deformity or atrophy  Skin: warm and dry, no rash Neuro:  Strength and sensation are intact Psych: euthymic mood, full affect   EKG:  EKG is ordered today. The ekg ordered today demonstrates : Sinus tachycardia Right bundle branch block When compared with ECG of 31-Jul-2024 14:34, Premature ventricular complexes are no longer Present    Recent Labs: 03/30/2024: ALT 18; BUN 14; Creatinine, Ser 0.78; Hemoglobin 13.2; Platelets 267.0; Potassium 4.3; Sodium 141; TSH 0.61    Lipid Panel    Component Value Date/Time   CHOL 122 09/28/2023 0949   CHOL 178 08/28/2016 0806   TRIG 153.0 (H) 09/28/2023 0949   HDL 40.00 09/28/2023 0949   HDL 48 08/28/2016 0806   CHOLHDL 3 09/28/2023 0949   VLDL 30.6 09/28/2023 0949   LDLCALC 51 09/28/2023 0949   LDLCALC 106 (H) 08/28/2016 0806   LDLDIRECT 138.8 06/17/2011 0850      Wt Readings from  Last 3 Encounters:  10/20/24 200 lb 4 oz (90.8 kg)  10/02/24 205 lb (93 kg)  07/31/24 204 lb 12.8 oz (92.9 kg)         ASSESSMENT AND PLAN:  1.  Chronic systolic heart failure: Prolonged history of nonischemic cardiomyopathy with fluctuation in his ejection fraction.  Most recently, EF was 35 to 40%.  He appears to be euvolemic with no volume overload.   PVC burden has been low but I wonder if resting sinus tachycardia might be contributing to his worsening EF.   I elected to switch carvedilol  to Toprol  100 mg once daily.  He is otherwise on good heart failure medications including Farxiga , losartan  and spironolactone .   If his heart rate remains high, we will consider adding ivabradine .   2. Essential hypertension: Blood pressure is controlled on current medications  3. Hyperlipidemia: Continue treatment with atorvastatin .  Most recent lipid profile showed an LDL of 51.  4. Bilateral carotid disease: Repeat carotid Doppler in July 11, 2017 showed only mild nonobstructive disease.  Continue to follow clinically.  5.  History of frequent PVCs: Status post successful ablation with low burden overall.   Disposition:   FU with me in 3 months.  Signed,  Deatrice Cage, MD  10/20/2024 9:36 AM    [facility]

## 2024-10-20 NOTE — Patient Instructions (Signed)
 Medication Instructions:  STOP the Carvedilol   START Metoprolol  (Toprol ) 100 mg once daily *If you need a refill on your cardiac medications before your next appointment, please call your pharmacy*  Lab Work: None ordered If you have labs (blood work) drawn today and your tests are completely normal, you will receive your results only by: MyChart Message (if you have MyChart) OR A paper copy in the mail If you have any lab test that is abnormal or we need to change your treatment, we will call you to review the results.  Testing/Procedures: None ordered  Follow-Up: At [facility], you and your health needs are our priority.  As part of our continuing mission to provide you with exceptional heart care, our providers are all part of one team.  This team includes your primary Cardiologist (physician) and Advanced Practice Providers or APPs (Physician Assistants and Nurse Practitioners) who all work together to provide you with the care you need, when you need it.  Your next appointment:   3 month(s)  Provider:   You may see Deatrice Cage, MD or one of the following Advanced Practice Providers on your designated Care Team:   Lonni Meager, NP Lesley Maffucci, PA-C Bernardino Bring, PA-C Cadence Bellevue, PA-C Tylene Lunch, NP Barnie Hila, NP    We recommend signing up for the patient portal called MyChart.  Sign up information is provided on this After Visit Summary.  MyChart is used to connect with patients for Virtual Visits (Telemedicine).  Patients are able to view lab/test results, encounter notes, upcoming appointments, etc.  Non-urgent messages can be sent to your provider as well.   To learn more about what you can do with MyChart, go to [email or web address].

## 2024-10-24 ENCOUNTER — Telehealth: Payer: Self-pay | Admitting: Cardiovascular Disease

## 2024-10-24 NOTE — Telephone Encounter (Signed)
 Pt c/o medication issue:  1. Name of Medication:  metoprolol  succinate (TOPROL -XL) 100 MG 24 hr tablet  2. How are you currently taking this medication (dosage and times per day)? As written  3. Are you having a reaction (difficulty breathing--STAT)? no  4. What is your medication issue? Causing dizziness. Please advise.

## 2024-10-25 NOTE — Telephone Encounter (Signed)
 Called and spoke with the patient.  Patient reports feeling fine today.  BP is back to normal numbers, HR is a little high at 95 but no other symptoms of dizziness, lightheadedness, or chest pain reported.  Patient states the episode of dizziness may be due to something else and reports taking all of his medications as prescribed without missing doses.  Offered the patient an office visit, and patient states he will call back and will schedule that if he has another episode of that.  Patient expressed gratitude for the phone call with all questions and concerns addressed at this time.

## 2024-10-27 NOTE — Progress Notes (Signed)
 BURGESS SHERIFF                                          MRN: [id number]   10/27/2024   The VBCI Quality Team Specialist reviewed this patient medical record for the purposes of chart review for care gap closure. The following were reviewed: abstraction for care gap closure-kidney health evaluation for diabetes:eGFR  and uACR.    VBCI Quality Team

## 2024-11-15 ENCOUNTER — Telehealth: Payer: Self-pay

## 2024-11-15 ENCOUNTER — Other Ambulatory Visit (HOSPITAL_COMMUNITY): Payer: Self-pay

## 2024-11-15 NOTE — Telephone Encounter (Signed)
 Pharmacy Patient Advocate Encounter  Received notification from [facility] that Prior Authorization for Ozempic  2 has been APPROVED from 11/15/24 to 11/15/25. Ran test claim, Copay is $536.01. This test claim was processed through [facility]- copay amounts may vary at other pharmacies due to pharmacy/plan contracts, or as the patient moves through the different stages of their insurance plan.   PA #/Case ID/Reference #: # [id number]

## 2024-11-15 NOTE — Telephone Encounter (Signed)
 Pharmacy Patient Advocate Encounter   Received notification from [facility] that prior authorization for Ozempic  2 is required/requested.   Insurance verification completed.   The patient is insured through [facility].   Per test claim: PA required; PA submitted to above mentioned insurance via Latent Key/confirmation #/EOC [id number] Status is pending

## 2024-11-29 NOTE — Progress Notes (Signed)
 Tim Mccullough                                          MRN: [id number]   11/29/2024   The VBCI Quality Team Specialist reviewed this patient medical record for the purposes of chart review for care gap closure. The following were reviewed: chart review for care gap closure-diabetic eye exam.    VBCI Quality Team

## 2024-11-30 ENCOUNTER — Telehealth: Payer: Self-pay

## 2025-01-18 ENCOUNTER — Ambulatory Visit: Admitting: Cardiovascular Disease

## 2025-03-27 ENCOUNTER — Other Ambulatory Visit

## 2025-04-03 ENCOUNTER — Encounter: Admitting: Family Medicine

## 2025-05-29 ENCOUNTER — Ambulatory Visit
